# Patient Record
Sex: Male | Born: 1940 | Race: Black or African American | Hispanic: No | Smoking: Current some day smoker
Health system: Southern US, Community
[De-identification: ages and names within clinical notes are randomized; demographics above are authoritative.]

## PROBLEM LIST (undated history)

## (undated) DIAGNOSIS — I255 Ischemic cardiomyopathy: Secondary | ICD-10-CM

## (undated) DIAGNOSIS — Z972 Presence of dental prosthetic device (complete) (partial): Secondary | ICD-10-CM

## (undated) DIAGNOSIS — I2510 Atherosclerotic heart disease of native coronary artery without angina pectoris: Secondary | ICD-10-CM

## (undated) DIAGNOSIS — I10 Essential (primary) hypertension: Secondary | ICD-10-CM

## (undated) DIAGNOSIS — Z973 Presence of spectacles and contact lenses: Secondary | ICD-10-CM

## (undated) DIAGNOSIS — I509 Heart failure, unspecified: Secondary | ICD-10-CM

## (undated) DIAGNOSIS — M109 Gout, unspecified: Secondary | ICD-10-CM

## (undated) DIAGNOSIS — I714 Abdominal aortic aneurysm, without rupture, unspecified: Secondary | ICD-10-CM

## (undated) DIAGNOSIS — M1990 Unspecified osteoarthritis, unspecified site: Secondary | ICD-10-CM

## (undated) DIAGNOSIS — K08109 Complete loss of teeth, unspecified cause, unspecified class: Secondary | ICD-10-CM

## (undated) DIAGNOSIS — Z4502 Encounter for adjustment and management of automatic implantable cardiac defibrillator: Secondary | ICD-10-CM

## (undated) DIAGNOSIS — J189 Pneumonia, unspecified organism: Secondary | ICD-10-CM

## (undated) DIAGNOSIS — E7800 Pure hypercholesterolemia, unspecified: Secondary | ICD-10-CM

## (undated) DIAGNOSIS — N289 Disorder of kidney and ureter, unspecified: Secondary | ICD-10-CM

## (undated) DIAGNOSIS — I4891 Unspecified atrial fibrillation: Secondary | ICD-10-CM

## (undated) HISTORY — PX: SHOULDER SURGERY: SHX246

## (undated) HISTORY — PX: WRIST SURGERY: SHX841

## (undated) HISTORY — DX: Encounter for adjustment and management of automatic implantable cardiac defibrillator: Z45.02

## (undated) HISTORY — PX: COLONOSCOPY W/ BIOPSIES AND POLYPECTOMY: SHX1376

## (undated) HISTORY — PX: TOTAL SHOULDER REPLACEMENT: SUR1217

## (undated) HISTORY — PX: MULTIPLE TOOTH EXTRACTIONS: SHX2053

## (undated) HISTORY — DX: Ischemic cardiomyopathy: I25.5

## (undated) HISTORY — PX: CARDIAC SURGERY: SHX584

## (undated) HISTORY — PX: MASS EXCISION: SHX2000

## (undated) HISTORY — PX: PACEMAKER PLACEMENT: SHX43

## (undated) HISTORY — DX: Disorder of kidney and ureter, unspecified: N28.9

## (undated) HISTORY — PX: BACK SURGERY: SHX140

## (undated) HISTORY — PX: CATARACT EXTRACTION W/ INTRAOCULAR LENS  IMPLANT, BILATERAL: SHX1307

---

## 2013-07-15 DIAGNOSIS — M109 Gout, unspecified: Secondary | ICD-10-CM | POA: Diagnosis not present

## 2013-07-15 DIAGNOSIS — I10 Essential (primary) hypertension: Secondary | ICD-10-CM | POA: Diagnosis not present

## 2013-07-15 DIAGNOSIS — I5022 Chronic systolic (congestive) heart failure: Secondary | ICD-10-CM | POA: Diagnosis not present

## 2013-07-15 DIAGNOSIS — E7800 Pure hypercholesterolemia, unspecified: Secondary | ICD-10-CM | POA: Diagnosis not present

## 2013-09-09 DIAGNOSIS — N189 Chronic kidney disease, unspecified: Secondary | ICD-10-CM | POA: Diagnosis not present

## 2013-09-09 DIAGNOSIS — I2510 Atherosclerotic heart disease of native coronary artery without angina pectoris: Secondary | ICD-10-CM | POA: Diagnosis not present

## 2013-09-09 DIAGNOSIS — M79609 Pain in unspecified limb: Secondary | ICD-10-CM | POA: Diagnosis not present

## 2013-09-09 DIAGNOSIS — J811 Chronic pulmonary edema: Secondary | ICD-10-CM | POA: Diagnosis not present

## 2013-09-09 DIAGNOSIS — I129 Hypertensive chronic kidney disease with stage 1 through stage 4 chronic kidney disease, or unspecified chronic kidney disease: Secondary | ICD-10-CM | POA: Diagnosis not present

## 2013-09-09 DIAGNOSIS — M25559 Pain in unspecified hip: Secondary | ICD-10-CM | POA: Diagnosis not present

## 2013-09-09 DIAGNOSIS — I252 Old myocardial infarction: Secondary | ICD-10-CM | POA: Diagnosis not present

## 2013-09-09 DIAGNOSIS — H81399 Other peripheral vertigo, unspecified ear: Secondary | ICD-10-CM | POA: Diagnosis not present

## 2013-09-09 DIAGNOSIS — Z7982 Long term (current) use of aspirin: Secondary | ICD-10-CM | POA: Diagnosis not present

## 2013-09-09 DIAGNOSIS — E875 Hyperkalemia: Secondary | ICD-10-CM | POA: Diagnosis not present

## 2013-09-09 DIAGNOSIS — I4891 Unspecified atrial fibrillation: Secondary | ICD-10-CM | POA: Diagnosis not present

## 2013-09-09 DIAGNOSIS — I509 Heart failure, unspecified: Secondary | ICD-10-CM | POA: Diagnosis not present

## 2013-09-09 DIAGNOSIS — E785 Hyperlipidemia, unspecified: Secondary | ICD-10-CM | POA: Diagnosis not present

## 2013-09-09 DIAGNOSIS — R51 Headache: Secondary | ICD-10-CM | POA: Diagnosis not present

## 2013-09-09 DIAGNOSIS — I5022 Chronic systolic (congestive) heart failure: Secondary | ICD-10-CM | POA: Diagnosis not present

## 2013-09-09 DIAGNOSIS — Z950 Presence of cardiac pacemaker: Secondary | ICD-10-CM | POA: Diagnosis not present

## 2013-09-09 DIAGNOSIS — J449 Chronic obstructive pulmonary disease, unspecified: Secondary | ICD-10-CM | POA: Diagnosis not present

## 2013-09-14 DIAGNOSIS — N183 Chronic kidney disease, stage 3 unspecified: Secondary | ICD-10-CM | POA: Diagnosis not present

## 2013-09-14 DIAGNOSIS — I10 Essential (primary) hypertension: Secondary | ICD-10-CM | POA: Diagnosis not present

## 2013-09-14 DIAGNOSIS — N2581 Secondary hyperparathyroidism of renal origin: Secondary | ICD-10-CM | POA: Diagnosis not present

## 2013-09-21 DIAGNOSIS — N183 Chronic kidney disease, stage 3 unspecified: Secondary | ICD-10-CM | POA: Diagnosis not present

## 2013-09-21 DIAGNOSIS — I10 Essential (primary) hypertension: Secondary | ICD-10-CM | POA: Diagnosis not present

## 2013-10-14 DIAGNOSIS — J449 Chronic obstructive pulmonary disease, unspecified: Secondary | ICD-10-CM | POA: Diagnosis not present

## 2013-10-14 DIAGNOSIS — M109 Gout, unspecified: Secondary | ICD-10-CM | POA: Diagnosis not present

## 2013-10-14 DIAGNOSIS — R42 Dizziness and giddiness: Secondary | ICD-10-CM | POA: Diagnosis not present

## 2013-10-14 DIAGNOSIS — IMO0002 Reserved for concepts with insufficient information to code with codable children: Secondary | ICD-10-CM | POA: Diagnosis not present

## 2013-10-22 DIAGNOSIS — H6120 Impacted cerumen, unspecified ear: Secondary | ICD-10-CM | POA: Diagnosis not present

## 2013-10-22 DIAGNOSIS — H9190 Unspecified hearing loss, unspecified ear: Secondary | ICD-10-CM | POA: Diagnosis not present

## 2013-10-22 DIAGNOSIS — I951 Orthostatic hypotension: Secondary | ICD-10-CM | POA: Diagnosis not present

## 2013-10-22 DIAGNOSIS — J449 Chronic obstructive pulmonary disease, unspecified: Secondary | ICD-10-CM | POA: Diagnosis not present

## 2013-10-22 DIAGNOSIS — N183 Chronic kidney disease, stage 3 unspecified: Secondary | ICD-10-CM | POA: Diagnosis not present

## 2013-10-22 DIAGNOSIS — IMO0002 Reserved for concepts with insufficient information to code with codable children: Secondary | ICD-10-CM | POA: Diagnosis not present

## 2013-10-22 DIAGNOSIS — M109 Gout, unspecified: Secondary | ICD-10-CM | POA: Diagnosis not present

## 2013-10-22 DIAGNOSIS — I9589 Other hypotension: Secondary | ICD-10-CM | POA: Diagnosis not present

## 2013-10-29 DIAGNOSIS — I5022 Chronic systolic (congestive) heart failure: Secondary | ICD-10-CM | POA: Diagnosis not present

## 2013-10-29 DIAGNOSIS — I509 Heart failure, unspecified: Secondary | ICD-10-CM | POA: Diagnosis not present

## 2013-10-29 DIAGNOSIS — E875 Hyperkalemia: Secondary | ICD-10-CM | POA: Diagnosis not present

## 2013-10-29 DIAGNOSIS — I2510 Atherosclerotic heart disease of native coronary artery without angina pectoris: Secondary | ICD-10-CM | POA: Diagnosis not present

## 2013-10-29 DIAGNOSIS — H6120 Impacted cerumen, unspecified ear: Secondary | ICD-10-CM | POA: Diagnosis not present

## 2013-12-01 DIAGNOSIS — H903 Sensorineural hearing loss, bilateral: Secondary | ICD-10-CM | POA: Diagnosis not present

## 2013-12-02 DIAGNOSIS — H903 Sensorineural hearing loss, bilateral: Secondary | ICD-10-CM | POA: Diagnosis not present

## 2013-12-02 DIAGNOSIS — I509 Heart failure, unspecified: Secondary | ICD-10-CM | POA: Diagnosis not present

## 2013-12-02 DIAGNOSIS — I5022 Chronic systolic (congestive) heart failure: Secondary | ICD-10-CM | POA: Diagnosis not present

## 2013-12-02 DIAGNOSIS — I10 Essential (primary) hypertension: Secondary | ICD-10-CM | POA: Diagnosis not present

## 2013-12-02 DIAGNOSIS — I2510 Atherosclerotic heart disease of native coronary artery without angina pectoris: Secondary | ICD-10-CM | POA: Diagnosis not present

## 2013-12-09 DIAGNOSIS — H6120 Impacted cerumen, unspecified ear: Secondary | ICD-10-CM | POA: Diagnosis not present

## 2013-12-09 DIAGNOSIS — H6980 Other specified disorders of Eustachian tube, unspecified ear: Secondary | ICD-10-CM | POA: Diagnosis not present

## 2014-01-24 DIAGNOSIS — I10 Essential (primary) hypertension: Secondary | ICD-10-CM | POA: Diagnosis not present

## 2014-01-24 DIAGNOSIS — N183 Chronic kidney disease, stage 3 unspecified: Secondary | ICD-10-CM | POA: Diagnosis not present

## 2014-01-24 DIAGNOSIS — N2581 Secondary hyperparathyroidism of renal origin: Secondary | ICD-10-CM | POA: Diagnosis not present

## 2014-02-10 DIAGNOSIS — N183 Chronic kidney disease, stage 3 unspecified: Secondary | ICD-10-CM | POA: Diagnosis not present

## 2014-02-10 DIAGNOSIS — I10 Essential (primary) hypertension: Secondary | ICD-10-CM | POA: Diagnosis not present

## 2014-02-18 DIAGNOSIS — I428 Other cardiomyopathies: Secondary | ICD-10-CM | POA: Diagnosis not present

## 2014-02-18 DIAGNOSIS — I10 Essential (primary) hypertension: Secondary | ICD-10-CM | POA: Diagnosis not present

## 2014-02-18 DIAGNOSIS — Z95810 Presence of automatic (implantable) cardiac defibrillator: Secondary | ICD-10-CM | POA: Diagnosis not present

## 2014-02-18 DIAGNOSIS — I4891 Unspecified atrial fibrillation: Secondary | ICD-10-CM | POA: Diagnosis not present

## 2014-03-31 DIAGNOSIS — I2510 Atherosclerotic heart disease of native coronary artery without angina pectoris: Secondary | ICD-10-CM | POA: Diagnosis not present

## 2014-03-31 DIAGNOSIS — Z23 Encounter for immunization: Secondary | ICD-10-CM | POA: Diagnosis not present

## 2014-03-31 DIAGNOSIS — J449 Chronic obstructive pulmonary disease, unspecified: Secondary | ICD-10-CM | POA: Diagnosis not present

## 2014-05-17 DIAGNOSIS — I5023 Acute on chronic systolic (congestive) heart failure: Secondary | ICD-10-CM | POA: Diagnosis present

## 2014-05-17 DIAGNOSIS — I252 Old myocardial infarction: Secondary | ICD-10-CM | POA: Diagnosis not present

## 2014-05-17 DIAGNOSIS — J449 Chronic obstructive pulmonary disease, unspecified: Secondary | ICD-10-CM | POA: Diagnosis not present

## 2014-05-17 DIAGNOSIS — I10 Essential (primary) hypertension: Secondary | ICD-10-CM | POA: Diagnosis not present

## 2014-05-17 DIAGNOSIS — I129 Hypertensive chronic kidney disease with stage 1 through stage 4 chronic kidney disease, or unspecified chronic kidney disease: Secondary | ICD-10-CM | POA: Diagnosis not present

## 2014-05-17 DIAGNOSIS — I509 Heart failure, unspecified: Secondary | ICD-10-CM | POA: Diagnosis not present

## 2014-05-17 DIAGNOSIS — E785 Hyperlipidemia, unspecified: Secondary | ICD-10-CM | POA: Diagnosis present

## 2014-05-17 DIAGNOSIS — I4891 Unspecified atrial fibrillation: Secondary | ICD-10-CM | POA: Diagnosis not present

## 2014-05-17 DIAGNOSIS — M1000 Idiopathic gout, unspecified site: Secondary | ICD-10-CM | POA: Diagnosis present

## 2014-05-17 DIAGNOSIS — R0602 Shortness of breath: Secondary | ICD-10-CM | POA: Diagnosis not present

## 2014-05-17 DIAGNOSIS — I2510 Atherosclerotic heart disease of native coronary artery without angina pectoris: Secondary | ICD-10-CM | POA: Diagnosis present

## 2014-05-17 DIAGNOSIS — N183 Chronic kidney disease, stage 3 (moderate): Secondary | ICD-10-CM | POA: Diagnosis not present

## 2014-05-17 DIAGNOSIS — Z87891 Personal history of nicotine dependence: Secondary | ICD-10-CM | POA: Diagnosis not present

## 2014-05-17 DIAGNOSIS — Z950 Presence of cardiac pacemaker: Secondary | ICD-10-CM | POA: Diagnosis not present

## 2014-06-04 DIAGNOSIS — I129 Hypertensive chronic kidney disease with stage 1 through stage 4 chronic kidney disease, or unspecified chronic kidney disease: Secondary | ICD-10-CM | POA: Diagnosis not present

## 2014-06-04 DIAGNOSIS — I509 Heart failure, unspecified: Secondary | ICD-10-CM | POA: Diagnosis not present

## 2014-06-04 DIAGNOSIS — J449 Chronic obstructive pulmonary disease, unspecified: Secondary | ICD-10-CM | POA: Diagnosis not present

## 2014-06-04 DIAGNOSIS — R069 Unspecified abnormalities of breathing: Secondary | ICD-10-CM | POA: Diagnosis not present

## 2014-06-04 DIAGNOSIS — N183 Chronic kidney disease, stage 3 (moderate): Secondary | ICD-10-CM | POA: Diagnosis not present

## 2014-06-05 DIAGNOSIS — I2510 Atherosclerotic heart disease of native coronary artery without angina pectoris: Secondary | ICD-10-CM | POA: Diagnosis present

## 2014-06-05 DIAGNOSIS — Z95810 Presence of automatic (implantable) cardiac defibrillator: Secondary | ICD-10-CM | POA: Diagnosis not present

## 2014-06-05 DIAGNOSIS — Z6830 Body mass index (BMI) 30.0-30.9, adult: Secondary | ICD-10-CM | POA: Diagnosis not present

## 2014-06-05 DIAGNOSIS — Z7982 Long term (current) use of aspirin: Secondary | ICD-10-CM | POA: Diagnosis not present

## 2014-06-05 DIAGNOSIS — R918 Other nonspecific abnormal finding of lung field: Secondary | ICD-10-CM | POA: Diagnosis not present

## 2014-06-05 DIAGNOSIS — J449 Chronic obstructive pulmonary disease, unspecified: Secondary | ICD-10-CM | POA: Diagnosis not present

## 2014-06-05 DIAGNOSIS — N183 Chronic kidney disease, stage 3 (moderate): Secondary | ICD-10-CM | POA: Diagnosis present

## 2014-06-05 DIAGNOSIS — I252 Old myocardial infarction: Secondary | ICD-10-CM | POA: Diagnosis not present

## 2014-06-05 DIAGNOSIS — K761 Chronic passive congestion of liver: Secondary | ICD-10-CM | POA: Diagnosis present

## 2014-06-05 DIAGNOSIS — J439 Emphysema, unspecified: Secondary | ICD-10-CM | POA: Diagnosis present

## 2014-06-05 DIAGNOSIS — Z87891 Personal history of nicotine dependence: Secondary | ICD-10-CM | POA: Diagnosis not present

## 2014-06-05 DIAGNOSIS — M1990 Unspecified osteoarthritis, unspecified site: Secondary | ICD-10-CM | POA: Diagnosis present

## 2014-06-05 DIAGNOSIS — E785 Hyperlipidemia, unspecified: Secondary | ICD-10-CM | POA: Diagnosis present

## 2014-06-05 DIAGNOSIS — Z888 Allergy status to other drugs, medicaments and biological substances status: Secondary | ICD-10-CM | POA: Diagnosis not present

## 2014-06-05 DIAGNOSIS — I493 Ventricular premature depolarization: Secondary | ICD-10-CM | POA: Diagnosis present

## 2014-06-05 DIAGNOSIS — T501X5A Adverse effect of loop [high-ceiling] diuretics, initial encounter: Secondary | ICD-10-CM | POA: Diagnosis present

## 2014-06-05 DIAGNOSIS — I4891 Unspecified atrial fibrillation: Secondary | ICD-10-CM | POA: Diagnosis present

## 2014-06-05 DIAGNOSIS — I129 Hypertensive chronic kidney disease with stage 1 through stage 4 chronic kidney disease, or unspecified chronic kidney disease: Secondary | ICD-10-CM | POA: Diagnosis not present

## 2014-06-05 DIAGNOSIS — I5021 Acute systolic (congestive) heart failure: Secondary | ICD-10-CM | POA: Diagnosis not present

## 2014-06-05 DIAGNOSIS — M1000 Idiopathic gout, unspecified site: Secondary | ICD-10-CM | POA: Diagnosis present

## 2014-06-05 DIAGNOSIS — E669 Obesity, unspecified: Secondary | ICD-10-CM | POA: Diagnosis present

## 2014-06-05 DIAGNOSIS — Z91030 Bee allergy status: Secondary | ICD-10-CM | POA: Diagnosis not present

## 2014-06-05 DIAGNOSIS — E876 Hypokalemia: Secondary | ICD-10-CM | POA: Diagnosis not present

## 2014-06-05 DIAGNOSIS — I10 Essential (primary) hypertension: Secondary | ICD-10-CM | POA: Diagnosis not present

## 2014-06-05 DIAGNOSIS — I517 Cardiomegaly: Secondary | ICD-10-CM | POA: Diagnosis not present

## 2014-06-05 DIAGNOSIS — I5023 Acute on chronic systolic (congestive) heart failure: Secondary | ICD-10-CM | POA: Diagnosis not present

## 2014-06-05 DIAGNOSIS — I509 Heart failure, unspecified: Secondary | ICD-10-CM | POA: Diagnosis not present

## 2014-06-15 DIAGNOSIS — T783XXA Angioneurotic edema, initial encounter: Secondary | ICD-10-CM | POA: Diagnosis not present

## 2014-07-25 DIAGNOSIS — I10 Essential (primary) hypertension: Secondary | ICD-10-CM | POA: Diagnosis not present

## 2014-07-25 DIAGNOSIS — N183 Chronic kidney disease, stage 3 (moderate): Secondary | ICD-10-CM | POA: Diagnosis not present

## 2014-08-01 DIAGNOSIS — I10 Essential (primary) hypertension: Secondary | ICD-10-CM | POA: Diagnosis not present

## 2014-08-01 DIAGNOSIS — N183 Chronic kidney disease, stage 3 (moderate): Secondary | ICD-10-CM | POA: Diagnosis not present

## 2014-08-02 DIAGNOSIS — I482 Chronic atrial fibrillation: Secondary | ICD-10-CM | POA: Diagnosis not present

## 2014-08-02 DIAGNOSIS — Z95810 Presence of automatic (implantable) cardiac defibrillator: Secondary | ICD-10-CM | POA: Diagnosis not present

## 2014-08-02 DIAGNOSIS — I429 Cardiomyopathy, unspecified: Secondary | ICD-10-CM | POA: Diagnosis not present

## 2014-08-02 DIAGNOSIS — I10 Essential (primary) hypertension: Secondary | ICD-10-CM | POA: Diagnosis not present

## 2014-08-10 DIAGNOSIS — Z95810 Presence of automatic (implantable) cardiac defibrillator: Secondary | ICD-10-CM | POA: Diagnosis not present

## 2014-09-22 DIAGNOSIS — M159 Polyosteoarthritis, unspecified: Secondary | ICD-10-CM | POA: Diagnosis not present

## 2014-09-22 DIAGNOSIS — J449 Chronic obstructive pulmonary disease, unspecified: Secondary | ICD-10-CM | POA: Diagnosis not present

## 2014-09-22 DIAGNOSIS — R238 Other skin changes: Secondary | ICD-10-CM | POA: Diagnosis not present

## 2014-09-22 DIAGNOSIS — I10 Essential (primary) hypertension: Secondary | ICD-10-CM | POA: Diagnosis not present

## 2014-12-01 DIAGNOSIS — Z95810 Presence of automatic (implantable) cardiac defibrillator: Secondary | ICD-10-CM | POA: Diagnosis not present

## 2014-12-27 DIAGNOSIS — N2581 Secondary hyperparathyroidism of renal origin: Secondary | ICD-10-CM | POA: Diagnosis not present

## 2014-12-27 DIAGNOSIS — N183 Chronic kidney disease, stage 3 (moderate): Secondary | ICD-10-CM | POA: Diagnosis not present

## 2014-12-27 DIAGNOSIS — I10 Essential (primary) hypertension: Secondary | ICD-10-CM | POA: Diagnosis not present

## 2015-01-02 DIAGNOSIS — I10 Essential (primary) hypertension: Secondary | ICD-10-CM | POA: Diagnosis not present

## 2015-01-02 DIAGNOSIS — N183 Chronic kidney disease, stage 3 (moderate): Secondary | ICD-10-CM | POA: Diagnosis not present

## 2015-01-03 DIAGNOSIS — I482 Chronic atrial fibrillation: Secondary | ICD-10-CM | POA: Diagnosis not present

## 2015-01-03 DIAGNOSIS — I10 Essential (primary) hypertension: Secondary | ICD-10-CM | POA: Diagnosis not present

## 2015-01-03 DIAGNOSIS — I429 Cardiomyopathy, unspecified: Secondary | ICD-10-CM | POA: Diagnosis not present

## 2015-01-03 DIAGNOSIS — Z95810 Presence of automatic (implantable) cardiac defibrillator: Secondary | ICD-10-CM | POA: Diagnosis not present

## 2015-01-19 DIAGNOSIS — L600 Ingrowing nail: Secondary | ICD-10-CM | POA: Diagnosis not present

## 2015-02-23 DIAGNOSIS — I10 Essential (primary) hypertension: Secondary | ICD-10-CM | POA: Diagnosis not present

## 2015-02-23 DIAGNOSIS — R220 Localized swelling, mass and lump, head: Secondary | ICD-10-CM | POA: Diagnosis not present

## 2015-03-09 DIAGNOSIS — Z95810 Presence of automatic (implantable) cardiac defibrillator: Secondary | ICD-10-CM | POA: Diagnosis not present

## 2015-04-26 DIAGNOSIS — I482 Chronic atrial fibrillation: Secondary | ICD-10-CM | POA: Diagnosis not present

## 2015-04-26 DIAGNOSIS — N184 Chronic kidney disease, stage 4 (severe): Secondary | ICD-10-CM | POA: Diagnosis not present

## 2015-04-26 DIAGNOSIS — Z23 Encounter for immunization: Secondary | ICD-10-CM | POA: Diagnosis not present

## 2015-04-26 DIAGNOSIS — M5442 Lumbago with sciatica, left side: Secondary | ICD-10-CM | POA: Diagnosis not present

## 2015-04-26 DIAGNOSIS — R202 Paresthesia of skin: Secondary | ICD-10-CM | POA: Diagnosis not present

## 2015-04-26 DIAGNOSIS — I5022 Chronic systolic (congestive) heart failure: Secondary | ICD-10-CM | POA: Diagnosis not present

## 2015-04-26 DIAGNOSIS — G8929 Other chronic pain: Secondary | ICD-10-CM | POA: Diagnosis not present

## 2015-04-26 DIAGNOSIS — I10 Essential (primary) hypertension: Secondary | ICD-10-CM | POA: Diagnosis not present

## 2015-05-02 DIAGNOSIS — E7800 Pure hypercholesterolemia, unspecified: Secondary | ICD-10-CM | POA: Diagnosis not present

## 2015-05-02 DIAGNOSIS — Z95810 Presence of automatic (implantable) cardiac defibrillator: Secondary | ICD-10-CM | POA: Diagnosis not present

## 2015-05-02 DIAGNOSIS — I429 Cardiomyopathy, unspecified: Secondary | ICD-10-CM | POA: Diagnosis not present

## 2015-05-02 DIAGNOSIS — I10 Essential (primary) hypertension: Secondary | ICD-10-CM | POA: Diagnosis not present

## 2015-05-02 DIAGNOSIS — I4891 Unspecified atrial fibrillation: Secondary | ICD-10-CM | POA: Diagnosis not present

## 2015-05-05 DIAGNOSIS — H04123 Dry eye syndrome of bilateral lacrimal glands: Secondary | ICD-10-CM | POA: Diagnosis not present

## 2015-05-05 DIAGNOSIS — H43393 Other vitreous opacities, bilateral: Secondary | ICD-10-CM | POA: Diagnosis not present

## 2015-05-05 DIAGNOSIS — H2513 Age-related nuclear cataract, bilateral: Secondary | ICD-10-CM | POA: Diagnosis not present

## 2015-05-29 DIAGNOSIS — N183 Chronic kidney disease, stage 3 (moderate): Secondary | ICD-10-CM | POA: Diagnosis not present

## 2015-05-29 DIAGNOSIS — I501 Left ventricular failure: Secondary | ICD-10-CM | POA: Diagnosis not present

## 2015-06-01 DIAGNOSIS — N183 Chronic kidney disease, stage 3 (moderate): Secondary | ICD-10-CM | POA: Diagnosis not present

## 2015-06-01 DIAGNOSIS — I10 Essential (primary) hypertension: Secondary | ICD-10-CM | POA: Diagnosis not present

## 2015-07-07 ENCOUNTER — Inpatient Hospital Stay (HOSPITAL_COMMUNITY)
Admission: EM | Admit: 2015-07-07 | Discharge: 2015-07-09 | DRG: 292 | Disposition: A | Discharge status: Home / Self Care | Payer: Medicare Other | Attending: Student in an Organized Health Care Education/Training Program | Admitting: Student in an Organized Health Care Education/Training Program

## 2015-07-07 ENCOUNTER — Encounter (HOSPITAL_COMMUNITY): Payer: Self-pay

## 2015-07-07 ENCOUNTER — Emergency Department (HOSPITAL_COMMUNITY): Payer: Medicare Other

## 2015-07-07 DIAGNOSIS — I509 Heart failure, unspecified: Secondary | ICD-10-CM

## 2015-07-07 DIAGNOSIS — E7800 Pure hypercholesterolemia, unspecified: Secondary | ICD-10-CM | POA: Diagnosis not present

## 2015-07-07 DIAGNOSIS — N184 Chronic kidney disease, stage 4 (severe): Secondary | ICD-10-CM | POA: Diagnosis present

## 2015-07-07 DIAGNOSIS — R52 Pain, unspecified: Secondary | ICD-10-CM | POA: Diagnosis not present

## 2015-07-07 DIAGNOSIS — Z791 Long term (current) use of non-steroidal anti-inflammatories (NSAID): Secondary | ICD-10-CM | POA: Diagnosis not present

## 2015-07-07 DIAGNOSIS — M109 Gout, unspecified: Secondary | ICD-10-CM | POA: Diagnosis present

## 2015-07-07 DIAGNOSIS — Z950 Presence of cardiac pacemaker: Secondary | ICD-10-CM | POA: Diagnosis not present

## 2015-07-07 DIAGNOSIS — Z91048 Other nonmedicinal substance allergy status: Secondary | ICD-10-CM

## 2015-07-07 DIAGNOSIS — Z7901 Long term (current) use of anticoagulants: Secondary | ICD-10-CM | POA: Diagnosis not present

## 2015-07-07 DIAGNOSIS — I2510 Atherosclerotic heart disease of native coronary artery without angina pectoris: Secondary | ICD-10-CM | POA: Diagnosis present

## 2015-07-07 DIAGNOSIS — R600 Localized edema: Secondary | ICD-10-CM | POA: Diagnosis present

## 2015-07-07 DIAGNOSIS — Z7982 Long term (current) use of aspirin: Secondary | ICD-10-CM | POA: Diagnosis not present

## 2015-07-07 DIAGNOSIS — I255 Ischemic cardiomyopathy: Secondary | ICD-10-CM | POA: Diagnosis not present

## 2015-07-07 DIAGNOSIS — Z881 Allergy status to other antibiotic agents status: Secondary | ICD-10-CM | POA: Diagnosis not present

## 2015-07-07 DIAGNOSIS — M7989 Other specified soft tissue disorders: Secondary | ICD-10-CM | POA: Diagnosis not present

## 2015-07-07 DIAGNOSIS — R0602 Shortness of breath: Secondary | ICD-10-CM | POA: Diagnosis not present

## 2015-07-07 DIAGNOSIS — M79604 Pain in right leg: Secondary | ICD-10-CM | POA: Diagnosis not present

## 2015-07-07 DIAGNOSIS — J449 Chronic obstructive pulmonary disease, unspecified: Secondary | ICD-10-CM | POA: Diagnosis present

## 2015-07-07 DIAGNOSIS — I110 Hypertensive heart disease with heart failure: Secondary | ICD-10-CM | POA: Diagnosis not present

## 2015-07-07 DIAGNOSIS — Z87891 Personal history of nicotine dependence: Secondary | ICD-10-CM

## 2015-07-07 DIAGNOSIS — I5022 Chronic systolic (congestive) heart failure: Secondary | ICD-10-CM | POA: Clinically undetermined

## 2015-07-07 DIAGNOSIS — M25561 Pain in right knee: Secondary | ICD-10-CM | POA: Diagnosis present

## 2015-07-07 DIAGNOSIS — Z880 Allergy status to penicillin: Secondary | ICD-10-CM

## 2015-07-07 DIAGNOSIS — M79605 Pain in left leg: Secondary | ICD-10-CM | POA: Diagnosis not present

## 2015-07-07 DIAGNOSIS — E8770 Fluid overload, unspecified: Secondary | ICD-10-CM | POA: Diagnosis present

## 2015-07-07 DIAGNOSIS — Z95810 Presence of automatic (implantable) cardiac defibrillator: Secondary | ICD-10-CM

## 2015-07-07 DIAGNOSIS — I130 Hypertensive heart and chronic kidney disease with heart failure and stage 1 through stage 4 chronic kidney disease, or unspecified chronic kidney disease: Secondary | ICD-10-CM | POA: Diagnosis not present

## 2015-07-07 DIAGNOSIS — Z888 Allergy status to other drugs, medicaments and biological substances status: Secondary | ICD-10-CM

## 2015-07-07 DIAGNOSIS — M25461 Effusion, right knee: Secondary | ICD-10-CM | POA: Diagnosis not present

## 2015-07-07 DIAGNOSIS — I480 Paroxysmal atrial fibrillation: Secondary | ICD-10-CM

## 2015-07-07 DIAGNOSIS — I5020 Unspecified systolic (congestive) heart failure: Secondary | ICD-10-CM

## 2015-07-07 HISTORY — DX: Essential (primary) hypertension: I10

## 2015-07-07 HISTORY — DX: Pure hypercholesterolemia, unspecified: E78.00

## 2015-07-07 HISTORY — DX: Gout, unspecified: M10.9

## 2015-07-07 HISTORY — DX: Atherosclerotic heart disease of native coronary artery without angina pectoris: I25.10

## 2015-07-07 HISTORY — DX: Unspecified osteoarthritis, unspecified site: M19.90

## 2015-07-07 HISTORY — DX: Heart failure, unspecified: I50.9

## 2015-07-07 HISTORY — DX: Unspecified atrial fibrillation: I48.91

## 2015-07-07 LAB — I-STAT TROPONIN, ED: TROPONIN I, POC: 0.06 ng/mL (ref 0.00–0.08)

## 2015-07-07 LAB — BASIC METABOLIC PANEL
Anion gap: 10 (ref 5–15)
BUN: 29 mg/dL — AB (ref 6–20)
CALCIUM: 9.6 mg/dL (ref 8.9–10.3)
CO2: 31 mmol/L (ref 22–32)
CREATININE: 2.73 mg/dL — AB (ref 0.61–1.24)
Chloride: 99 mmol/L — ABNORMAL LOW (ref 101–111)
GFR calc Af Amer: 24 mL/min — ABNORMAL LOW (ref >60)
GFR, EST NON AFRICAN AMERICAN: 21 mL/min — AB (ref >60)
GLUCOSE: 133 mg/dL — AB (ref 65–99)
POTASSIUM: 4.3 mmol/L (ref 3.5–5.1)
SODIUM: 140 mmol/L (ref 135–145)

## 2015-07-07 LAB — BRAIN NATRIURETIC PEPTIDE: B Natriuretic Peptide: 2600.9 pg/mL — ABNORMAL HIGH (ref 0.0–100.0)

## 2015-07-07 LAB — I-STAT CHEM 8, ED
BUN: 32 mg/dL — ABNORMAL HIGH (ref 6–20)
CALCIUM ION: 1.27 mmol/L (ref 1.13–1.30)
CREATININE: 2.4 mg/dL — AB (ref 0.61–1.24)
Chloride: 98 mmol/L — ABNORMAL LOW (ref 101–111)
GLUCOSE: 126 mg/dL — AB (ref 65–99)
HCT: 42 % (ref 39.0–52.0)
HEMOGLOBIN: 14.3 g/dL (ref 13.0–17.0)
Potassium: 3.5 mmol/L (ref 3.5–5.1)
Sodium: 144 mmol/L (ref 135–145)
TCO2: 33 mmol/L (ref 0–100)

## 2015-07-07 LAB — CBC WITH DIFFERENTIAL/PLATELET
BASOS ABS: 0 10*3/uL (ref 0.0–0.1)
BASOS PCT: 0 %
EOS ABS: 0.3 10*3/uL (ref 0.0–0.7)
EOS PCT: 2 %
HCT: 38.7 % — ABNORMAL LOW (ref 39.0–52.0)
Hemoglobin: 11.8 g/dL — ABNORMAL LOW (ref 13.0–17.0)
Lymphocytes Relative: 10 %
Lymphs Abs: 1.3 10*3/uL (ref 0.7–4.0)
MCH: 25 pg — ABNORMAL LOW (ref 26.0–34.0)
MCHC: 30.5 g/dL (ref 30.0–36.0)
MCV: 82 fL (ref 78.0–100.0)
MONO ABS: 1.3 10*3/uL — AB (ref 0.1–1.0)
Monocytes Relative: 10 %
Neutro Abs: 10 10*3/uL — ABNORMAL HIGH (ref 1.7–7.7)
Neutrophils Relative %: 78 %
PLATELETS: 175 10*3/uL (ref 150–400)
RBC: 4.72 MIL/uL (ref 4.22–5.81)
RDW: 16.4 % — AB (ref 11.5–15.5)
WBC: 12.9 10*3/uL — ABNORMAL HIGH (ref 4.0–10.5)

## 2015-07-07 LAB — HEPATIC FUNCTION PANEL
ALBUMIN: 3.3 g/dL — AB (ref 3.5–5.0)
ALT: 19 U/L (ref 17–63)
AST: 20 U/L (ref 15–41)
Alkaline Phosphatase: 61 U/L (ref 38–126)
BILIRUBIN DIRECT: 0.3 mg/dL (ref 0.1–0.5)
Indirect Bilirubin: 0.7 mg/dL (ref 0.3–0.9)
Total Bilirubin: 1 mg/dL (ref 0.3–1.2)
Total Protein: 7.3 g/dL (ref 6.5–8.1)

## 2015-07-07 MED ORDER — FUROSEMIDE 10 MG/ML IJ SOLN
40.0000 mg | Freq: Once | INTRAMUSCULAR | Status: AC
  Administered 2015-07-07: 40 mg via INTRAVENOUS
  Filled 2015-07-07: qty 4

## 2015-07-07 MED ORDER — ACETAMINOPHEN 325 MG PO TABS: 650.0000 mg | ORAL_TABLET | Freq: Four times a day (QID) | ORAL | Status: DC | PRN

## 2015-07-07 MED ORDER — ASPIRIN EC 81 MG PO TBEC
81.0000 mg | DELAYED_RELEASE_TABLET | Freq: Every day | ORAL | Status: DC
  Administered 2015-07-07 – 2015-07-09 (×3): 81 mg via ORAL
  Filled 2015-07-07 (×3): qty 1

## 2015-07-07 MED ORDER — ATORVASTATIN CALCIUM 20 MG PO TABS
20.0000 mg | ORAL_TABLET | Freq: Every day | ORAL | Status: DC
  Administered 2015-07-07 – 2015-07-09 (×3): 20 mg via ORAL
  Filled 2015-07-07 (×3): qty 1

## 2015-07-07 MED ORDER — DIGOXIN 125 MCG PO TABS
0.1250 mg | ORAL_TABLET | Freq: Every day | ORAL | Status: DC
  Administered 2015-07-07 – 2015-07-09 (×3): 0.125 mg via ORAL
  Filled 2015-07-07 (×3): qty 1

## 2015-07-07 MED ORDER — MORPHINE SULFATE (PF) 2 MG/ML IV SOLN
2.0000 mg | Freq: Once | INTRAVENOUS | Status: AC
  Administered 2015-07-07: 2 mg via INTRAVENOUS
  Filled 2015-07-07: qty 1

## 2015-07-07 MED ORDER — PREDNISONE 50 MG PO TABS
50.0000 mg | ORAL_TABLET | Freq: Every day | ORAL | Status: DC
  Administered 2015-07-08 – 2015-07-09 (×2): 50 mg via ORAL
  Filled 2015-07-07 (×2): qty 1

## 2015-07-07 MED ORDER — DICLOFENAC SODIUM 1 % TD GEL
2.0000 g | Freq: Four times a day (QID) | TRANSDERMAL | Status: DC
  Administered 2015-07-07 – 2015-07-09 (×7): 2 g via TOPICAL
  Filled 2015-07-07: qty 100

## 2015-07-07 MED ORDER — ALBUTEROL SULFATE (2.5 MG/3ML) 0.083% IN NEBU: 2.5000 mg | INHALATION_SOLUTION | Freq: Four times a day (QID) | RESPIRATORY_TRACT | Status: DC | PRN

## 2015-07-07 MED ORDER — APIXABAN 5 MG PO TABS
5.0000 mg | ORAL_TABLET | Freq: Every day | ORAL | Status: DC
  Administered 2015-07-07 – 2015-07-08 (×2): 5 mg via ORAL
  Filled 2015-07-07 (×2): qty 1

## 2015-07-07 MED ORDER — CALCITRIOL 0.25 MCG PO CAPS
0.2500 ug | ORAL_CAPSULE | Freq: Every day | ORAL | Status: DC
  Administered 2015-07-07 – 2015-07-09 (×3): 0.25 ug via ORAL
  Filled 2015-07-07 (×3): qty 1

## 2015-07-07 MED ORDER — FUROSEMIDE 10 MG/ML IJ SOLN
40.0000 mg | Freq: Once | INTRAMUSCULAR | Status: AC
  Administered 2015-07-08: 40 mg via INTRAVENOUS
  Filled 2015-07-07: qty 4

## 2015-07-07 MED ORDER — ISOSORB DINITRATE-HYDRALAZINE 20-37.5 MG PO TABS
1.0000 | ORAL_TABLET | Freq: Three times a day (TID) | ORAL | Status: DC
  Administered 2015-07-07 – 2015-07-09 (×5): 1 via ORAL
  Filled 2015-07-07 (×5): qty 1

## 2015-07-07 MED ORDER — ACETAMINOPHEN-CODEINE #3 300-30 MG PO TABS
1.0000 | ORAL_TABLET | Freq: Four times a day (QID) | ORAL | Status: DC | PRN
  Administered 2015-07-08: 2 via ORAL
  Filled 2015-07-07: qty 2

## 2015-07-07 MED ORDER — TIOTROPIUM BROMIDE MONOHYDRATE 18 MCG IN CAPS
18.0000 ug | ORAL_CAPSULE | Freq: Every day | RESPIRATORY_TRACT | Status: DC
  Administered 2015-07-08 – 2015-07-09 (×2): 18 ug via RESPIRATORY_TRACT
  Filled 2015-07-07: qty 5

## 2015-07-07 MED ORDER — MOMETASONE FURO-FORMOTEROL FUM 100-5 MCG/ACT IN AERO
2.0000 | INHALATION_SPRAY | Freq: Two times a day (BID) | RESPIRATORY_TRACT | Status: DC
  Administered 2015-07-07 – 2015-07-09 (×4): 2 via RESPIRATORY_TRACT
  Filled 2015-07-07 (×2): qty 8.8

## 2015-07-07 MED ORDER — PREDNISONE 20 MG PO TABS
40.0000 mg | ORAL_TABLET | Freq: Every day | ORAL | Status: DC
  Administered 2015-07-07: 40 mg via ORAL
  Filled 2015-07-07: qty 2

## 2015-07-07 MED ORDER — CARVEDILOL 25 MG PO TABS
25.0000 mg | ORAL_TABLET | Freq: Every day | ORAL | Status: DC
  Administered 2015-07-07 – 2015-07-09 (×3): 25 mg via ORAL
  Filled 2015-07-07 (×3): qty 1

## 2015-07-07 NOTE — ED Notes (Signed)
Attempted report 

## 2015-07-07 NOTE — ED Notes (Signed)
Report received from [place], [place]- pt moved to rm [id number].

## 2015-07-07 NOTE — ED Notes (Signed)
Pt presents with 2-3 day h/o shortness of breath and BLE swelling.  Pt reports intermittent chest pain that is "slight", denies at present.  Pt reports "light" cough, reports nausea in route.

## 2015-07-07 NOTE — H&P (Signed)
Date: 07/07/2015               Patient Name:  Anthony Foster MRN: [id number]  DOB: [birth year] Age / Sex: 75 y.o., male   PCP: No primary care provider on file.         Medical Service: Internal Medicine Teaching Service         Attending Physician: Dr. Axel Filler, MD    First Contact: Dr. Marijean Bravo Pager: [phone]  Second Contact: Dr. 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 Pt says that he had a 5-57min episode of difficulty breathing when he woke up yesterday,  Bilateral Leg swelling- noticed by patient yesterday. He also complains of right knee pain that started ~3 days ago, sudden, with associated redness, and pain with mobility or bearing weight. He denies fever, chills, vomiting. He has a hx of gout, med list has colchicine and allorpurinol, which he says he takes as needed. He last took both meds- ~1 week ago. He normally has gout on his feet. Patient also mentioned SOB to Ed providers, on questioning he says he felt sob yesterday for about 72mins when he woke up, but is not short of breath today, he says he has always used 2 pillows to sleep, checks his weight regularly, and range- 190-200, last was 197 three days ago. He has cough productive of minimal sputum, he also says he has wheezing. He says he has some difficulty breathing when he doing certain activity like bowling. He denies chest pain, but apparently said otherwise in the ED. Pt quit smoking 3 years ago, but previously smoked 3PPD, started in his teens. He used to live in [place], [place] up at [employer] with Cardiology, nephrology and had a PCP. Marland Kitchen He has relocated to [facility]  permanently, but has not established care.   Meds: No current facility-administered medications for this encounter.   Current Outpatient Prescriptions  Medication Sig Dispense Refill  . albuterol (PROVENTIL HFA;VENTOLIN HFA) 108 (90 Base) MCG/ACT inhaler Inhale 1 puff into the lungs every 6 (six) hours as needed for wheezing or shortness of breath.    . allopurinol (ZYLOPRIM) 300 MG tablet Take 300 mg by mouth daily as needed. For gout    . apixaban (ELIQUIS) 2.5 MG TABS tablet Take 5 mg by mouth daily.     Marland Kitchen aspirin 325 MG tablet Take 325 mg by mouth daily.    Marland Kitchen atorvastatin (LIPITOR) 20 MG tablet Take 20 mg by mouth daily.    . calcitRIOL (ROCALTROL) 0.25 MCG capsule Take 0.25 mcg by mouth daily.    . carvedilol (COREG) 12.5 MG tablet Take 25 mg by mouth daily.     . colchicine 0.6 MG tablet Take 0.6 mg by mouth daily.    . diclofenac sodium (VOLTAREN) 1 % GEL Apply 2 g topically 4 (four) times daily.    . digoxin (LANOXIN) 0.125 MG tablet Take 0.125 mg by mouth daily.    . Fluticasone-Salmeterol (ADVAIR) 250-50 MCG/DOSE AEPB Inhale 1 puff into the lungs 2 (two) times daily.    . furosemide (LASIX) 20 MG tablet Take 20 mg  by mouth 3 (three) times daily.    . isosorbide-hydrALAZINE (BIDIL) 20-37.5 MG tablet Take 1 tablet by mouth 3 (three) times daily.    Marland Kitchen tiotropium (SPIRIVA) 18 MCG inhalation capsule Place 18 mcg into inhaler and inhale daily.      Allergies: Allergies as of 07/07/2015 - Review Complete 07/07/2015  Allergen Reaction Noted  . Hymenoptera venom preparations  07/07/2015  . Lisinopril Other (See Comments) 07/07/2015  . Piroxicam Other (See Comments) 07/07/2015  . Amoxicillin-pot clavulanate Rash 07/07/2015   Past Medical History  Diagnosis Date  . Atrial fibrillation ([place])   . Hypertension   . Hypercholesteremia   . Renal disorder   . Coronary artery disease   . CHF (congestive heart failure) ([place])   . Degenerative joint disease   . Gout    Past Surgical  History  Procedure Laterality Date  . Cardiac surgery    . Pacemaker placement     History reviewed. No pertinent family history. Social History   Social History  . Marital Status: Unknown    Spouse Name: N/A  . Number of Children: N/A  . Years of Education: N/A   Occupational History  . Not on file.   Social History Main Topics  . Smoking status: Former Research scientist (life sciences)  . Smokeless tobacco: Not on file  . Alcohol Use: Not on file  . Drug Use: Not on file  . Sexual Activity: Not on file   Other Topics Concern  . Not on file   Social History Narrative  . No narrative on file   Review of Systems: CONSTITUTIONAL- No Fever, or change in appetite. SKIN- No rash HEAD- No Headache or dizziness. Mouth/throat- No Sorethroat, dentures, or bleeding gums. GI- No nausea, vomiting, diarrhoea, constipation, abd pain. URINARY- No Frequency, or dysuria. NEUROLOGIC- No Numbness, or burning. [facility]- Denies depression or anxiety.  Physical Exam: Blood pressure 148/93, pulse 69, temperature 98.6 F (37 C), temperature source Oral, resp. rate 18, SpO2 95 %. GENERAL- alert, co-operative, appears as stated age, not in any distress, on 2L of O2 in the Ed, he is not on o2 at home. HEENT- Atraumatic, normocephalic, PERRL,  oral mucosa appears moist, neck supple. CARDIAC- Regular, no murmurs, rubs or gallops. RESP- Moving equal volumes of air, and clear to auscultation bilaterally, no wheezes or crackles appreciated. ABDOMEN- Soft, nontender, bowel sounds present. BACK- Normal curvature of the spine NEURO- No obvious Cr N abnormality, strenght upper and lower extremities EXTREMITIES- +2 pitting edema to mid leg, right knee- swollen with erythema and warmth, restricted range of motion, tender to touch. SKIN- Warm, dry, No rash or lesion. PSYCH- Normal mood and affect, appropriate thought content and speech.  Lab results: Basic Metabolic Panel:  Recent Labs  07/07/15 0739  NA 144  K 3.5  CL 98*   GLUCOSE 126*  BUN 32*  CREATININE 2.40*   Liver Function Tests:  Recent Labs  07/07/15 0739  AST 20  ALT 19  ALKPHOS 61  BILITOT 1.0  PROT 7.3  ALBUMIN 3.3*   CBC:  Recent Labs  07/07/15 0739  WBC 12.9*  NEUTROABS 10.0*  HGB 11.8*  14.3  HCT 38.7*  42.0  MCV 82.0  PLT 175   Imaging results:  Dg Chest 2 View  07/07/2015  CLINICAL DATA:  75 year old male with mid and left side chest pain and shortness of breath for 2 days. Former smoker. Initial encounter. EXAM: CHEST  2 VIEW COMPARISON:  None. FINDINGS: Left chest dual lead cardiac AICD  device. Moderate cardiomegaly. Somewhat low lung volumes. Other mediastinal contours are within normal limits. Visualized tracheal air column is within normal limits. No pneumothorax or pulmonary edema. No pleural effusion or consolidation. No acute osseous abnormality identified. IMPRESSION: Cardiomegaly and mildly low lung volumes. No acute cardiopulmonary abnormality. Electronically Signed   By: Genevie Ann M.D.   On: 07/07/2015 08:04   Dg Knee Complete 4 Views Right  07/07/2015  CLINICAL DATA:  Knee pain and swelling for 2 days.  No known injury. EXAM: RIGHT KNEE - COMPLETE 4+ VIEW COMPARISON:  None. FINDINGS: Moderate tricompartmental degenerative changes with joint space narrowing and osteophytic spurring. No acute fracture or osteochondral lesion. Soft tissue swelling noted over the patella and patellar tendon could be due to prepatellar bursitis. Recommend clinical correlation. IMPRESSION: Tricompartmental degenerative changes but no acute fracture. Prepatellar soft tissue swelling may suggest prepatellar bursitis. Electronically Signed   By: Marijo Sanes M.D.   On: 07/07/2015 08:04    Other results: EKG: Rate- 90 bpm, regular sinus rhythm, increased PR interval- 233, prolonged QRS-163, prolonged Qtc- 507.    Assessment & Plan by Problem: Active Problems:   CHF (congestive heart failure) (HCC)   Right knee pain   Pedal edema   Fluid  overload  Fluid overload- With new pitting peripheral edema, Pt has a hx of ischemic cardiomyopathy- Ef noted on Cardiology notes in care everywhere- 35-40%  ? When. Pt also has CKD- Cr- last 2.9 in 2015, Cr here- 2.4. BNP elevated at 2600, but pt has CKD,also unsure of baseline. Chest xray- without obvious congestion, IV lasix- 40mg  daily given in the ED. Pt is on typical heart failure meds- Bidil, Coreg 12.5, on lasix 20mg  TID  , and reports compliance. He does not appear markedly elevated,  - Admit to tele - Check weight - IV lasix 40mg  am - Daily weights, baseline weight of 190-200.  - Strict in and out - Trend trops with reported chest pain, and SOB yesterday. - Trops X3 - EKG am - On o2, wean off, and check sats  Knee swelling- Differentials- Gout, Bursitis, septic arthritis. Xray knee- soft tissue swelling suggesting bursitis. Possibly gout, as he as a hx and has not been compliant with therapy. Considering Normal temp, WBC- 12.9, will treat for possible gout first and then if pain is still present consider septic arthritis. - Prednisone- 40mg  daily, which would help for both gout and bursitis. - If swelling fails to improve today, then consider consult ortho for aspiration and antibiotics for septic arthritis  Atria Fib- Paroxysmal- pt is on Eliquis and coreg - Appears rate controlled - Cont Anticoag  COPD- possible cause of initial SOB, pt says he was wheezing. Chest exam unremarkable. - Cont Alb inh - Cont advair - Cont Spiriva  Cardiomyopathy, ischemic- EF- 35-40%, with ICD in place. - Cont aspirin and lipitor - Cont meds above  Dispo: Disposition is deferred at this time, awaiting improvement of current medical problems.  Signed: Bethena Roys, MD 07/07/2015, 12:29 PM

## 2015-07-07 NOTE — ED Notes (Signed)
Pt O2 sats intermittently drop to 84-85% while pt is resting.  O2 applied via nasal cannula at 2 liters.

## 2015-07-07 NOTE — ED Provider Notes (Signed)
CSN: [id number]     Arrival date & time 07/07/15  0701 History   First MD Initiated Contact with Patient 07/07/15 [phone]     No chief complaint on file.    (Consider location/radiation/quality/duration/timing/severity/associated sxs/prior Treatment) Patient is a 75 y.o. male presenting with shortness of breath. The history is provided by the patient (Patient complains of shortness of breath and swelling in his legs. Also some increased right knee pain.).  Shortness of Breath Severity:  Moderate Onset quality:  Sudden Timing:  Constant Progression:  Worsening Chronicity:  Recurrent Context: activity   Associated symptoms: no abdominal pain, no chest pain, no cough, no headaches and no rash     Past Medical History  Diagnosis Date  . Atrial fibrillation ([place])   . Hypertension   . Hypercholesteremia   . Renal disorder   . Coronary artery disease   . CHF (congestive heart failure) ([place])   . Degenerative joint disease   . Gout    Past Surgical History  Procedure Laterality Date  . Cardiac surgery    . Pacemaker placement     History reviewed. No pertinent family history. Social History  Substance Use Topics  . Smoking status: Former Research scientist (life sciences)  . Smokeless tobacco: None  . Alcohol Use: None    Review of Systems  Constitutional: Negative for appetite change and fatigue.  HENT: Negative for congestion, ear discharge and sinus pressure.   Eyes: Negative for discharge.  Respiratory: Positive for shortness of breath. Negative for cough.   Cardiovascular: Negative for chest pain.  Gastrointestinal: Negative for abdominal pain and diarrhea.  Genitourinary: Negative for frequency and hematuria.  Musculoskeletal: Negative for back pain.       Right knee pain  Skin: Negative for rash.  Neurological: Negative for seizures and headaches.  Psychiatric/Behavioral: Negative for hallucinations.      Allergies  Hymenoptera venom preparations; Lisinopril; Piroxicam; and  Amoxicillin-pot clavulanate  Home Medications   Prior to Admission medications   Medication Sig Start Date End Date Taking? Authorizing Provider  albuterol (PROVENTIL HFA;VENTOLIN HFA) 108 (90 Base) MCG/ACT inhaler Inhale 1 puff into the lungs every 6 (six) hours as needed for wheezing or shortness of breath.   Yes Historical Provider, MD  allopurinol (ZYLOPRIM) 300 MG tablet Take 300 mg by mouth daily as needed. For gout   Yes Historical Provider, MD  apixaban (ELIQUIS) 2.5 MG TABS tablet Take 5 mg by mouth daily.    Yes Historical Provider, MD  aspirin 325 MG tablet Take 325 mg by mouth daily.   Yes Historical Provider, MD  atorvastatin (LIPITOR) 20 MG tablet Take 20 mg by mouth daily.   Yes Historical Provider, MD  calcitRIOL (ROCALTROL) 0.25 MCG capsule Take 0.25 mcg by mouth daily.   Yes Historical Provider, MD  carvedilol (COREG) 12.5 MG tablet Take 25 mg by mouth daily.    Yes Historical Provider, MD  colchicine 0.6 MG tablet Take 0.6 mg by mouth daily.   Yes Historical Provider, MD  diclofenac sodium (VOLTAREN) 1 % GEL Apply 2 g topically 4 (four) times daily.   Yes Historical Provider, MD  digoxin (LANOXIN) 0.125 MG tablet Take 0.125 mg by mouth daily.   Yes Historical Provider, MD  Fluticasone-Salmeterol (ADVAIR) 250-50 MCG/DOSE AEPB Inhale 1 puff into the lungs 2 (two) times daily.   Yes Historical Provider, MD  furosemide (LASIX) 20 MG tablet Take 20 mg by mouth 3 (three) times daily.   Yes Historical Provider, MD  isosorbide-hydrALAZINE (BIDIL) 20-37.5 MG  tablet Take 1 tablet by mouth 3 (three) times daily.   Yes Historical Provider, MD  tiotropium (SPIRIVA) 18 MCG inhalation capsule Place 18 mcg into inhaler and inhale daily.   Yes Historical Provider, MD   BP 137/95 mmHg  Pulse 81  Temp(Src) 98.6 F (37 C) (Oral)  Resp 18  SpO2 94% Physical Exam  Constitutional: He is oriented to person, place, and time. He appears well-developed.  HENT:  Head: Normocephalic.  Eyes:  Conjunctivae and EOM are normal. No scleral icterus.  Neck: Neck supple. No thyromegaly present.  Cardiovascular: Normal rate and regular rhythm.  Exam reveals no gallop and no friction rub.   No murmur heard. Pulmonary/Chest: No stridor. He has no wheezes. He has no rales. He exhibits no tenderness.  Abdominal: He exhibits no distension. There is no tenderness. There is no rebound.  Musculoskeletal: He exhibits edema and tenderness.  Patient has 3+ edema bilaterally up to his knees.  He also has tenderness over his patella  Lymphadenopathy:    He has no cervical adenopathy.  Neurological: He is oriented to person, place, and time. He exhibits normal muscle tone. Coordination normal.  Skin: No rash noted. No erythema.  Psychiatric: He has a normal mood and affect. His behavior is normal.    ED Course  Procedures (including critical care time) Labs Review Labs Reviewed  CBC WITH DIFFERENTIAL/PLATELET - Abnormal; Notable for the following:    WBC 12.9 (*)    Hemoglobin 11.8 (*)    HCT 38.7 (*)    MCH 25.0 (*)    RDW 16.4 (*)    Neutro Abs 10.0 (*)    Monocytes Absolute 1.3 (*)    All other components within normal limits  HEPATIC FUNCTION PANEL - Abnormal; Notable for the following:    Albumin 3.3 (*)    All other components within normal limits  BRAIN NATRIURETIC PEPTIDE - Abnormal; Notable for the following:    B Natriuretic Peptide 2600.9 (*)    All other components within normal limits  I-STAT CHEM 8, ED - Abnormal; Notable for the following:    Chloride 98 (*)    BUN 32 (*)    Creatinine, Ser 2.40 (*)    Glucose, Bld 126 (*)    All other components within normal limits  I-STAT TROPOININ, ED    Imaging Review Dg Chest 2 View  07/07/2015  CLINICAL DATA:  75 year old male with mid and left side chest pain and shortness of breath for 2 days. Former smoker. Initial encounter. EXAM: CHEST  2 VIEW COMPARISON:  None. FINDINGS: Left chest dual lead cardiac AICD device. Moderate  cardiomegaly. Somewhat low lung volumes. Other mediastinal contours are within normal limits. Visualized tracheal air column is within normal limits. No pneumothorax or pulmonary edema. No pleural effusion or consolidation. No acute osseous abnormality identified. IMPRESSION: Cardiomegaly and mildly low lung volumes. No acute cardiopulmonary abnormality. Electronically Signed   By: Genevie Ann M.D.   On: 07/07/2015 08:04   Dg Knee Complete 4 Views Right  07/07/2015  CLINICAL DATA:  Knee pain and swelling for 2 days.  No known injury. EXAM: RIGHT KNEE - COMPLETE 4+ VIEW COMPARISON:  None. FINDINGS: Moderate tricompartmental degenerative changes with joint space narrowing and osteophytic spurring. No acute fracture or osteochondral lesion. Soft tissue swelling noted over the patella and patellar tendon could be due to prepatellar bursitis. Recommend clinical correlation. IMPRESSION: Tricompartmental degenerative changes but no acute fracture. Prepatellar soft tissue swelling may suggest prepatellar bursitis. Electronically  Signed   By: Marijo Sanes M.D.   On: 07/07/2015 08:04   I have personally reviewed and evaluated these images and lab results as part of my medical decision-making.   EKG Interpretation   Date/Time:  Friday July 07 2015 07:06:51 EST Ventricular Rate:  90 PR Interval:  233 QRS Duration: 163 QT Interval:  414 QTC Calculation: 507 R Axis:   -96 Text Interpretation:  Sinus rhythm Prolonged PR interval Nonspecific IVCD  with LAD Probable anteroseptal infarct, recent Confirmed by Raedyn Klinck  MD,  Anie Juniel [id number]) on 07/07/2015 9:39:16 AM      MDM   Final diagnoses:  Systolic congestive heart failure, unspecified congestive heart failure chronicity ([place])    Patient with congestive heart failure will admit for diuresis. He also has a bursitis to his right knee    Milton Ferguson, MD 07/07/15 1020

## 2015-07-08 DIAGNOSIS — Z7901 Long term (current) use of anticoagulants: Secondary | ICD-10-CM | POA: Diagnosis not present

## 2015-07-08 DIAGNOSIS — I255 Ischemic cardiomyopathy: Secondary | ICD-10-CM

## 2015-07-08 DIAGNOSIS — I5020 Unspecified systolic (congestive) heart failure: Secondary | ICD-10-CM | POA: Diagnosis not present

## 2015-07-08 DIAGNOSIS — Z7951 Long term (current) use of inhaled steroids: Secondary | ICD-10-CM

## 2015-07-08 DIAGNOSIS — I480 Paroxysmal atrial fibrillation: Secondary | ICD-10-CM | POA: Diagnosis not present

## 2015-07-08 DIAGNOSIS — M25561 Pain in right knee: Secondary | ICD-10-CM | POA: Diagnosis not present

## 2015-07-08 DIAGNOSIS — I130 Hypertensive heart and chronic kidney disease with heart failure and stage 1 through stage 4 chronic kidney disease, or unspecified chronic kidney disease: Secondary | ICD-10-CM | POA: Diagnosis not present

## 2015-07-08 DIAGNOSIS — M7989 Other specified soft tissue disorders: Secondary | ICD-10-CM | POA: Diagnosis not present

## 2015-07-08 DIAGNOSIS — N184 Chronic kidney disease, stage 4 (severe): Secondary | ICD-10-CM

## 2015-07-08 DIAGNOSIS — Z7982 Long term (current) use of aspirin: Secondary | ICD-10-CM

## 2015-07-08 DIAGNOSIS — J449 Chronic obstructive pulmonary disease, unspecified: Secondary | ICD-10-CM | POA: Diagnosis not present

## 2015-07-08 DIAGNOSIS — E8770 Fluid overload, unspecified: Secondary | ICD-10-CM

## 2015-07-08 DIAGNOSIS — I4891 Unspecified atrial fibrillation: Secondary | ICD-10-CM | POA: Diagnosis not present

## 2015-07-08 DIAGNOSIS — I5022 Chronic systolic (congestive) heart failure: Secondary | ICD-10-CM | POA: Diagnosis not present

## 2015-07-08 DIAGNOSIS — Z79899 Other long term (current) drug therapy: Secondary | ICD-10-CM

## 2015-07-08 DIAGNOSIS — Z95810 Presence of automatic (implantable) cardiac defibrillator: Secondary | ICD-10-CM

## 2015-07-08 LAB — BASIC METABOLIC PANEL
ANION GAP: 12 (ref 5–15)
BUN: 32 mg/dL — ABNORMAL HIGH (ref 6–20)
CALCIUM: 9.3 mg/dL (ref 8.9–10.3)
CO2: 28 mmol/L (ref 22–32)
CREATININE: 2.7 mg/dL — AB (ref 0.61–1.24)
Chloride: 100 mmol/L — ABNORMAL LOW (ref 101–111)
GFR, EST AFRICAN AMERICAN: 25 mL/min — AB (ref >60)
GFR, EST NON AFRICAN AMERICAN: 21 mL/min — AB (ref >60)
Glucose, Bld: 160 mg/dL — ABNORMAL HIGH (ref 65–99)
Potassium: 4 mmol/L (ref 3.5–5.1)
SODIUM: 140 mmol/L (ref 135–145)

## 2015-07-08 LAB — CBC
HEMATOCRIT: 36.8 % — AB (ref 39.0–52.0)
Hemoglobin: 11.5 g/dL — ABNORMAL LOW (ref 13.0–17.0)
MCH: 25.5 pg — ABNORMAL LOW (ref 26.0–34.0)
MCHC: 31.3 g/dL (ref 30.0–36.0)
MCV: 81.6 fL (ref 78.0–100.0)
PLATELETS: 175 10*3/uL (ref 150–400)
RBC: 4.51 MIL/uL (ref 4.22–5.81)
RDW: 16.5 % — AB (ref 11.5–15.5)
WBC: 13.5 10*3/uL — ABNORMAL HIGH (ref 4.0–10.5)

## 2015-07-08 LAB — TROPONIN I
TROPONIN I: 0.08 ng/mL — AB (ref <0.031)
Troponin I: 0.05 ng/mL — ABNORMAL HIGH (ref <0.031)

## 2015-07-08 MED ORDER — FUROSEMIDE 40 MG PO TABS: 40.0000 mg | ORAL_TABLET | Freq: Two times a day (BID) | ORAL | Status: DC

## 2015-07-08 MED ORDER — APIXABAN 2.5 MG PO TABS: 2.5000 mg | ORAL_TABLET | Freq: Two times a day (BID) | ORAL | Status: DC

## 2015-07-08 MED ORDER — APIXABAN 5 MG PO TABS
5.0000 mg | ORAL_TABLET | Freq: Two times a day (BID) | ORAL | Status: DC
  Administered 2015-07-08 – 2015-07-09 (×2): 5 mg via ORAL
  Filled 2015-07-08 (×2): qty 1

## 2015-07-08 MED ORDER — FUROSEMIDE 40 MG PO TABS
40.0000 mg | ORAL_TABLET | Freq: Two times a day (BID) | ORAL | Status: DC
  Administered 2015-07-08 – 2015-07-09 (×2): 40 mg via ORAL
  Filled 2015-07-08 (×2): qty 1

## 2015-07-08 NOTE — Progress Notes (Signed)
PT Cancellation Note  Patient Details Name: Anthony Foster MRN: [id number] DOB: Nov 23, 1940   Cancelled Treatment:    Reason Eval/Treat Not Completed: Patient declined, no reason specified Refused to work with therapy today. States "I know I'm not leaving until Monday." Explained role of PT. Still refuses. Will try again.  Ellouise Newer 07/08/2015, 6:06 PM Elayne Snare, [place]

## 2015-07-08 NOTE — Progress Notes (Signed)
Subjective: Pt states breathing has improved but he wants to stay another day for diuresis.   His right knee feels about the same in regards to pain as it did yesterday but he is able to move it more. He lives with his grandchildren at home and is independent in all his ADLs. Is agreeable to working w/ PT today.   Objective: Vital signs in last 24 hours: Filed Vitals:   07/08/15 0500 07/08/15 1024 07/08/15 1047 07/08/15 1112  BP:  122/54    Pulse:  75  78  Temp:  98 F (36.7 C)    TempSrc:  Oral    Resp:  18    Height:      Weight: 164 lb 8 oz (74.617 kg)     SpO2:  96% 94%    Weight change:   Intake/Output Summary (Last 24 hours) at 07/08/15 1201 Last data filed at 07/08/15 1024  Gross per 24 hour  Intake    220 ml  Output    850 ml  Net   -630 ml   Physical Exam  Constitutional: He appears well-developed and well-nourished. No distress.  HENT:  Head: Normocephalic.  Mouth/Throat: No oropharyngeal exudate.  Cardiovascular: Normal rate and regular rhythm.  Exam reveals no gallop and no friction rub.   No murmur heard. Pulmonary/Chest: Effort normal and breath sounds normal. No respiratory distress. He has no wheezes. He has no rales.  Musculoskeletal:  1+ pedal edema b/l, right knee mildly erythematous, warm, and painful to palpation, however he is moving he right knee more compared to yesterday, left knee is non tender  Skin: He is not diaphoretic.    Lab Results: Basic Metabolic Panel:  Recent Labs Lab 07/07/15 2247 07/08/15 0445  NA 140 140  K 4.3 4.0  CL 99* 100*  CO2 31 28  GLUCOSE 133* 160*  BUN 29* 32*  CREATININE 2.73* 2.70*  CALCIUM 9.6 9.3   Liver Function Tests:  Recent Labs Lab 07/07/15 0739  AST 20  ALT 19  ALKPHOS 61  BILITOT 1.0  PROT 7.3  ALBUMIN 3.3*   CBC:  Recent Labs Lab 07/07/15 0739 07/08/15 0909  WBC 12.9* 13.5*  NEUTROABS 10.0*  --   HGB 11.8*  14.3 11.5*  HCT 38.7*  42.0 36.8*  MCV 82.0 81.6  PLT 175 175    Cardiac Enzymes:  Recent Labs Lab 07/07/15 2213 07/08/15 0445  TROPONINI 0.08* 0.05*   Studies/Results: Dg Chest 2 View  07/07/2015  CLINICAL DATA:  75 year old male with mid and left side chest pain and shortness of breath for 2 days. Former smoker. Initial encounter. EXAM: CHEST  2 VIEW COMPARISON:  None. FINDINGS: Left chest dual lead cardiac AICD device. Moderate cardiomegaly. Somewhat low lung volumes. Other mediastinal contours are within normal limits. Visualized tracheal air column is within normal limits. No pneumothorax or pulmonary edema. No pleural effusion or consolidation. No acute osseous abnormality identified. IMPRESSION: Cardiomegaly and mildly low lung volumes. No acute cardiopulmonary abnormality. Electronically Signed   By: Genevie Ann M.D.   On: 07/07/2015 08:04   Dg Knee Complete 4 Views Right  07/07/2015  CLINICAL DATA:  Knee pain and swelling for 2 days.  No known injury. EXAM: RIGHT KNEE - COMPLETE 4+ VIEW COMPARISON:  None. FINDINGS: Moderate tricompartmental degenerative changes with joint space narrowing and osteophytic spurring. No acute fracture or osteochondral lesion. Soft tissue swelling noted over the patella and patellar tendon could be due to prepatellar bursitis. Recommend clinical  correlation. IMPRESSION: Tricompartmental degenerative changes but no acute fracture. Prepatellar soft tissue swelling may suggest prepatellar bursitis. Electronically Signed   By: Marijo Sanes M.D.   On: 07/07/2015 08:04   Medications: I have reviewed the patient's current medications. Scheduled Meds: . apixaban  5 mg Oral Daily  . aspirin EC  81 mg Oral Daily  . atorvastatin  20 mg Oral Daily  . calcitRIOL  0.25 mcg Oral Daily  . carvedilol  25 mg Oral Daily  . diclofenac sodium  2 g Topical QID  . digoxin  0.125 mg Oral Daily  . furosemide  40 mg Oral BID  . isosorbide-hydrALAZINE  1 tablet Oral TID  . mometasone-formoterol  2 puff Inhalation BID  . predniSONE  50 mg  Oral Q breakfast  . tiotropium  18 mcg Inhalation Daily   Continuous Infusions:  PRN Meds:.acetaminophen-codeine, albuterol Assessment/Plan: Active Problems:   CHF (congestive heart failure) (HCC)   Right knee pain   Pedal edema   Fluid overload   Atrial fibrillation (HCC)   Gout   ICD (implantable cardioverter-defibrillator) in place   Fluid overload- BNP elevated at 2600 and creatinine around b/l on admission. He has received 2 dose of IV lasix 40mg  with net -1.3L since admission. Weight is unchanged. Per care everywhere his weight is around b/l. He appears very mildly hypervolemic w/ 1+ pedal edema however improved from prior day, likely with GFR <30 he will need to be sent home w/ increase in home lasix dose. Sating at 94% on room air. Overall improving, will keep an additional day for diuresis and to transition to po lasix.  - received dose of IV lasix 40mg  today, will transition to lasix 40mg  po BID  - Daily weights, Strict in and out - pt just recently moved from [place], [place] and needs to establish care w/ PCP, cardiologist, and nephro. Consulted CM to assist with this.  - PT consulted   Knee swelling- Differentials include Gout, Bursitis, septic arthritis. Xray right knee + soft tissue swelling suggesting bursitis. Possibly gout, as he as a hx and has not been compliant with allopurinol and colchicine. - Prednisone 50mg , will continue to monitor, if not improving can tap knee joint. Unlikely septic arthritis, leukocytosis likely due to gout flare and prednisone.  Atria Fib- Paroxysmal- continue Eliquis and coreg  COPD-  - Cont Alb inh, advair and Spiriva  Cardiomyopathy, ischemic- EF- 35-40%, with ICD in place. - Cont aspirin and lipitor - Cont meds above  Dispo: Disposition is deferred at this time, awaiting improvement of current medical problems.   .Services Needed at time of discharge: Y = Yes, Blank = No PT:   OT:   RN:   Equipment:   Other:     LOS: 1 day    Norman Herrlich, MD 07/08/2015, 12:01 PM

## 2015-07-09 MED ORDER — PREDNISONE 50 MG PO TABS: 50.0000 mg | ORAL_TABLET | Freq: Every day | ORAL | Status: DC

## 2015-07-09 MED ORDER — FUROSEMIDE 40 MG PO TABS: 40.0000 mg | ORAL_TABLET | Freq: Two times a day (BID) | ORAL | Status: DC

## 2015-07-09 NOTE — Progress Notes (Signed)
PT Cancellation Note  Patient Details Name: Anthony Foster MRN: [id number] DOB: [birth year]   Cancelled Treatment:    Reason Eval/Treat Not Completed: Patient declined, no reason specified.   Patient reports he has been up walking on his own with no assistive device.  Also reports his Rt knee is "better".  Patient declines PT services at this time.  Encouraged patient to contact MD if knee pain does not improve or worsens.  PT will sign off.   Despina Pole 07/09/2015, 12:23 PM Carita Pian. Sanjuana Kava, [place] Pager [phone]

## 2015-07-09 NOTE — Care Management Obs Status (Signed)
[place] NOTIFICATION   Patient Details  Name: Anthony Foster MRN: [id number] Date of Birth: 03-Feb-1941   Medicare Observation Status Notification Given:  Yes    Guido Sander, RN 07/09/2015, 12:09 PM

## 2015-07-09 NOTE — Progress Notes (Addendum)
Subjective: Pt has no complaints, states his right knee has gotten better. Refusing to work with PT, states he can walk on his own and not when he is forced to. Informed him that he is ready for discharge and he states he will try and make driving arrangements w/ his grandchildren.   Objective: Vital signs in last 24 hours: Filed Vitals:   07/08/15 2110 07/09/15 0500 07/09/15 0831 07/09/15 0835  BP:  140/71 140/70   Pulse: 79 74 73   Temp: 98.2 F (36.8 C) 97.8 F (36.6 C) 98.7 F (37.1 C)   TempSrc: Oral Oral Oral   Resp: 20 20 18    Height:      Weight: 166 lb 10.7 oz (75.6 kg)     SpO2: 97% 94% 96% 95%   Weight change: 2 lb 2.7 oz (0.983 kg)  Intake/Output Summary (Last 24 hours) at 07/09/15 1000 Last data filed at 07/09/15 0500  Gross per 24 hour  Intake    720 ml  Output    950 ml  Net   -230 ml   Physical Exam  Constitutional: He appears well-developed and well-nourished. No distress.  HENT:  Head: Normocephalic.  Mouth/Throat: No oropharyngeal exudate.  Cardiovascular: Normal rate and regular rhythm.  Exam reveals no gallop and no friction rub.   No murmur heard. Pulmonary/Chest: Effort normal. No respiratory distress. He has no wheezes. He has rales (LLL base).  Abdominal: Soft. Bowel sounds are normal. He exhibits no distension. There is no tenderness. There is no rebound and no guarding.  Musculoskeletal:  right knee mildly erythematous, warm, and less tender to palpation than previous day.   Skin: He is not diaphoretic.    Lab Results: Basic Metabolic Panel:  Recent Labs Lab 07/07/15 2247 07/08/15 0445  NA 140 140  K 4.3 4.0  CL 99* 100*  CO2 31 28  GLUCOSE 133* 160*  BUN 29* 32*  CREATININE 2.73* 2.70*  CALCIUM 9.6 9.3   Liver Function Tests:  Recent Labs Lab 07/07/15 0739  AST 20  ALT 19  ALKPHOS 61  BILITOT 1.0  PROT 7.3  ALBUMIN 3.3*   CBC:  Recent Labs Lab 07/07/15 0739 07/08/15 0909  WBC 12.9* 13.5*  NEUTROABS 10.0*  --    HGB 11.8*  14.3 11.5*  HCT 38.7*  42.0 36.8*  MCV 82.0 81.6  PLT 175 175   Cardiac Enzymes:  Recent Labs Lab 07/07/15 2213 07/08/15 0445  TROPONINI 0.08* 0.05*   Studies/Results: No results found. Medications: I have reviewed the patient's current medications. Scheduled Meds: . apixaban  5 mg Oral BID  . aspirin EC  81 mg Oral Daily  . atorvastatin  20 mg Oral Daily  . calcitRIOL  0.25 mcg Oral Daily  . carvedilol  25 mg Oral Daily  . diclofenac sodium  2 g Topical QID  . digoxin  0.125 mg Oral Daily  . furosemide  40 mg Oral BID  . isosorbide-hydrALAZINE  1 tablet Oral TID  . mometasone-formoterol  2 puff Inhalation BID  . predniSONE  50 mg Oral Q breakfast  . tiotropium  18 mcg Inhalation Daily   Continuous Infusions:  PRN Meds:.acetaminophen-codeine, albuterol Assessment/Plan: Active Problems:   CHF (congestive heart failure) (HCC)   Right knee pain   Pedal edema   Fluid overload   Atrial fibrillation (HCC)   Gout   ICD (implantable cardioverter-defibrillator) in place   Fluid overload- diuresing well, net negative 1.46 L since admission.  - continue  lasix 40mg  po BID which is the dose he will be sent home on - pt just recently moved from [place], [place] and needs to establish care w/ PCP, cardiologist, and nephro. Consulted CM to assist with this. Called CM again this morning and left a voicemail requesting call back to coordinate hospital f/u.  - PT re consulted this morning.   Gout- pt has rt knee swelling that has improved with steroids, like gout vs bursitis.  - Prednisone 50mg , will continue for 2 more days.   Atria Fib- Paroxysmal- continue Eliquis and coreg  COPD-  - Cont Alb inh, advair and Spiriva  Cardiomyopathy, ischemic- EF- 35-40%, with ICD in place. - Cont aspirin and lipitor - Cont meds above  Dispo: Discharge home today.   .Services Needed at time of discharge: Y = Yes, Blank = No PT:   OT:   RN:   Equipment:   Other:      LOS: 2 days   Norman Herrlich, MD 07/09/2015, 10:00 AM

## 2015-07-09 NOTE — Care Management Note (Signed)
Case Management Note  Patient Details  Name: Anthony Foster MRN: [id number] Date of Birth: Dec 03, 1940  Subjective/Objective:                  Per H &P: Pt is a 75 Y O M with PMH- Atria fib, CAD, ischemic cardiomyopathy- Ef- 35-40%, CKD 4, Gout, presented to the ED with complaints of leg swelling and knee pain.   Action/Plan: Cm spoke to patient at the bedside who said that he just moved here from [place], [place]. He said that he has medicare/tricare and prefers not to use the [place] services so that he can save those resources for his fellow veterans. Patient said that he lives with his grandson and has enough support at home. Patient has a walker and a cane and declined any other DME.Patient does not want [facility] services. CM provided patient with number for [facility] to locate pcp, cardiologist, and nephrologist. Cm provided the number for [place]. CM providedPt is a 75 Y O M with PMH- Atria fib, CAD, ischemic cardiomyopathy- Ef- 35-40%, CKD 4, Gout, presented to the ED with complaints of leg swelling and knee pain. Pt says that he had a 5-65min episode of difficulty breathing when he woke up yesterday, patient with [facility] Persons PCP information and [facility] PCP. Patient declines any further needs at this time and said that he has no difficulty obtaining medications. CM remains available should additional needs arise.   Expected Discharge Date:  07/09/15               Expected Discharge Plan:  Home/Self Care  In-House Referral:     Discharge planning Services  CM Consult  Post Acute Care Choice:    Choice offered to:  Patient  DME Arranged:    DME Agency:     HH Arranged:    [place] Agency:     Status of Service:  Completed, signed off  Medicare Important Message Given:  Yes Date Medicare IM Given:    Medicare IM give by:    Date Additional Medicare IM Given:    Additional Medicare Important Message give by:     If discussed at [place] of Stay Meetings, dates  discussed:    Additional Comments:  Guido Sander, RN 07/09/2015, 12:10 PM

## 2015-07-09 NOTE — Progress Notes (Signed)
Pt discharge instructions given, pt verbalized understanding.  VSS. Denies pain. Pt to have family member transport home.

## 2015-07-09 NOTE — Discharge Summary (Signed)
Name: Anthony Foster MRN: [id number] DOB: [birth year] 75 y.o. PCP: [facility]  Date of Admission: 07/07/2015  7:01 AM Date of Discharge: 07/10/2015 Attending Physician: No att. providers found  Discharge Diagnosis: 1. Systolic Congestive Heart Failure (EF 35-40%) 2. CKD Stage IV 3. Volume Overload 4. Atrial Fibrillation  5. Gout 6. Ischemic Cardiomyopathy  Discharge Medications:   Medication List    STOP taking these medications        allopurinol 300 MG tablet  Commonly known as:  ZYLOPRIM     colchicine 0.6 MG tablet      TAKE these medications        albuterol 108 (90 Base) MCG/ACT inhaler  Commonly known as:  PROVENTIL HFA;VENTOLIN HFA  Inhale 1 puff into the lungs every 6 (six) hours as needed for wheezing or shortness of breath.     apixaban 2.5 MG Tabs tablet  Commonly known as:  ELIQUIS  Take 5 mg by mouth daily.     aspirin 325 MG tablet  Take 325 mg by mouth daily.     atorvastatin 20 MG tablet  Commonly known as:  LIPITOR  Take 20 mg by mouth daily.     calcitRIOL 0.25 MCG capsule  Commonly known as:  ROCALTROL  Take 0.25 mcg by mouth daily.     carvedilol 12.5 MG tablet  Commonly known as:  COREG  Take 25 mg by mouth daily.     diclofenac sodium 1 % Gel  Commonly known as:  VOLTAREN  Apply 2 g topically 4 (four) times daily.     digoxin 0.125 MG tablet  Commonly known as:  LANOXIN  Take 0.125 mg by mouth daily.     Fluticasone-Salmeterol 250-50 MCG/DOSE Aepb  Commonly known as:  ADVAIR  Inhale 1 puff into the lungs 2 (two) times daily.     furosemide 40 MG tablet  Commonly known as:  LASIX  Take 1 tablet (40 mg total) by mouth 2 (two) times daily.     isosorbide-hydrALAZINE 20-37.5 MG tablet  Commonly known as:  BIDIL  Take 1 tablet by mouth 3 (three) times daily.     predniSONE 50 MG tablet  Commonly known as:  DELTASONE  Take 1 tablet (50 mg total) by mouth daily with breakfast.     tiotropium 18 MCG inhalation  capsule  Commonly known as:  SPIRIVA  Place 18 mcg into inhaler and inhale daily.        Disposition and follow-up:   Mr.Anthony Foster was discharged from [facility] in stable condition.  At the hospital follow up visit please address:  1.  Please address medication adherence and resolution of right knee pain.   2.  Labs / imaging needed at time of follow-up: None  3.  Pending labs/ test needing follow-up: None Follow-up Information    Follow up with [facility]. Schedule an appointment as soon as possible for a visit in 1 week.   Contact information:   Please call the number for medicare to find Primary Care Provider and Cardiologist.   You may also call [facility] to locate primary care provider and cardiologist. The number to call is [phone].      Schedule an appointment as soon as possible for a visit with [place].   Contact information:   Call to establish care within 2 weeks.   Call [facility].       Schedule an appointment as soon as possible for  a visit with Sherril Croon, MD.   Specialty:  Nephrology   Why:  Nephrologist-Follow up in 1-2 weeks.    Contact information:   [place] [address] [phone]       Procedures Performed:  Dg Chest 2 View  07/07/2015  CLINICAL DATA:  75 year old male with mid and left side chest pain and shortness of breath for 2 days. Former smoker. Initial encounter. EXAM: CHEST  2 VIEW COMPARISON:  None. FINDINGS: Left chest dual lead cardiac AICD device. Moderate cardiomegaly. Somewhat low lung volumes. Other mediastinal contours are within normal limits. Visualized tracheal air column is within normal limits. No pneumothorax or pulmonary edema. No pleural effusion or consolidation. No acute osseous abnormality identified. IMPRESSION: Cardiomegaly and mildly low lung volumes. No acute cardiopulmonary abnormality. Electronically Signed   By: Genevie Ann M.D.   On: 07/07/2015  08:04   Dg Knee Complete 4 Views Right  07/07/2015  CLINICAL DATA:  Knee pain and swelling for 2 days.  No known injury. EXAM: RIGHT KNEE - COMPLETE 4+ VIEW COMPARISON:  None. FINDINGS: Moderate tricompartmental degenerative changes with joint space narrowing and osteophytic spurring. No acute fracture or osteochondral lesion. Soft tissue swelling noted over the patella and patellar tendon could be due to prepatellar bursitis. Recommend clinical correlation. IMPRESSION: Tricompartmental degenerative changes but no acute fracture. Prepatellar soft tissue swelling may suggest prepatellar bursitis. Electronically Signed   By: Marijo Sanes M.D.   On: 07/07/2015 08:04    Admission HPI: Pt is a 75 Y O M with PMH- Atria fib, CAD, ischemic cardiomyopathy- Ef- 35-40%, CKD 4, Gout, presented to the ED with complaints of leg swelling and knee pain. Pt says that he had a 5-66min episode of difficulty breathing when he woke up yesterday, Bilateral Leg swelling- noticed by patient yesterday. He also complains of right knee pain that started ~3 days ago, sudden, with associated redness, and pain with mobility or bearing weight. He denies fever, chills, vomiting. He has a hx of gout, med list has colchicine and allorpurinol, which he says he takes as needed. He last took both meds- ~1 week ago. He normally has gout on his feet. Patient also mentioned SOB to Ed providers, on questioning he says he felt sob yesterday for about 11mins when he woke up, but is not short of breath today, he says he has always used 2 pillows to sleep, checks his weight regularly, and range- 190-200, last was 197 three days ago. He has cough productive of minimal sputum, he also says he has wheezing. He says he has some difficulty breathing when he doing certain activity like bowling. He denies chest pain, but apparently said otherwise in the ED. Pt quit smoking 3 years ago, but previously smoked 3PPD, started in his teens. He used to live in  [place], [place] up at [employer] with Cardiology, nephrology and had a PCP. Marland Kitchen He has relocated to [facility] permanently, but has not established care.   Hospital Course by problem list:   Fluid overload secondary to CKD Stage IV and CHF (EF 35-40%): Patient with diuresed with IV furosemide BID 40 mg with net -1.7L. Pedal edema improved on exam. His dyspnea improved and was satting 94% on room air by discharge. Case management was consulted to assist him with finding a cardiologist and nephrologist, and he was provided a list of resources of physicians in the area that could meet his needs. Physical therapy attempted to work with him, but he declined.  Gout Flare: Right knee pain improved on 50 mg prednisone daily. Pain was managed with Tylenol#3 as needed.  Allopurinol and colchicine were held. Once his knee pain improved, he was told to restart his allopurinol only, to be taken daily.   Paroxyxmal Atrial Fibrillation: Stable during hospitalization. Continued on home Eliquis and coreg.   COPD: Stable during hospitalization. Continued on home Alb inh, advair and Spiriva  Cardiomyopathy, ischemic with ICD in place: Continued on home aspirin and lipidor   Discharge Vitals:   BP 140/70 mmHg  Pulse 73  Temp(Src) 98.7 F (37.1 C) (Oral)  Resp 18  Ht 5\' 8"  (1.727 m)  Wt 166 lb 10.7 oz (75.6 kg)  BMI 25.35 kg/m2  SpO2 95%  Discharge Labs:  No results found for this or any previous visit (from the past 24 hour(s)).  Signed: Liberty Handy, MD 07/10/2015, 1:42 PM

## 2015-07-09 NOTE — Progress Notes (Signed)
Pt has 8 beat run of wide QRS, not v-tach.  Patient has pacemaker/defib on demand

## 2015-07-09 NOTE — Discharge Instructions (Signed)

## 2015-07-10 LAB — HEMOGLOBIN A1C
Hgb A1c MFr Bld: 6.1 % — ABNORMAL HIGH (ref 4.8–5.6)
Mean Plasma Glucose: 128 mg/dL

## 2015-08-28 DIAGNOSIS — I509 Heart failure, unspecified: Secondary | ICD-10-CM | POA: Diagnosis not present

## 2015-08-28 DIAGNOSIS — I4891 Unspecified atrial fibrillation: Secondary | ICD-10-CM | POA: Diagnosis not present

## 2015-08-28 DIAGNOSIS — J449 Chronic obstructive pulmonary disease, unspecified: Secondary | ICD-10-CM | POA: Diagnosis not present

## 2015-08-28 DIAGNOSIS — M109 Gout, unspecified: Secondary | ICD-10-CM | POA: Diagnosis not present

## 2015-08-28 DIAGNOSIS — I10 Essential (primary) hypertension: Secondary | ICD-10-CM | POA: Diagnosis not present

## 2015-08-28 DIAGNOSIS — Z95810 Presence of automatic (implantable) cardiac defibrillator: Secondary | ICD-10-CM | POA: Diagnosis not present

## 2015-08-28 DIAGNOSIS — E7800 Pure hypercholesterolemia, unspecified: Secondary | ICD-10-CM | POA: Diagnosis not present

## 2015-08-28 DIAGNOSIS — N184 Chronic kidney disease, stage 4 (severe): Secondary | ICD-10-CM | POA: Diagnosis not present

## 2015-08-28 DIAGNOSIS — Z1389 Encounter for screening for other disorder: Secondary | ICD-10-CM | POA: Diagnosis not present

## 2015-09-04 DIAGNOSIS — I5022 Chronic systolic (congestive) heart failure: Secondary | ICD-10-CM | POA: Diagnosis not present

## 2015-09-04 DIAGNOSIS — M79641 Pain in right hand: Secondary | ICD-10-CM | POA: Diagnosis not present

## 2015-09-04 DIAGNOSIS — R5383 Other fatigue: Secondary | ICD-10-CM | POA: Diagnosis not present

## 2015-09-04 DIAGNOSIS — M2550 Pain in unspecified joint: Secondary | ICD-10-CM | POA: Diagnosis not present

## 2015-09-04 DIAGNOSIS — M109 Gout, unspecified: Secondary | ICD-10-CM | POA: Diagnosis not present

## 2015-09-04 DIAGNOSIS — M79642 Pain in left hand: Secondary | ICD-10-CM | POA: Diagnosis not present

## 2015-09-04 DIAGNOSIS — M150 Primary generalized (osteo)arthritis: Secondary | ICD-10-CM | POA: Diagnosis not present

## 2015-09-04 DIAGNOSIS — N184 Chronic kidney disease, stage 4 (severe): Secondary | ICD-10-CM | POA: Diagnosis not present

## 2015-09-04 DIAGNOSIS — M5136 Other intervertebral disc degeneration, lumbar region: Secondary | ICD-10-CM | POA: Diagnosis not present

## 2015-09-07 NOTE — Progress Notes (Signed)
HPI: 75 year old male for evaluation of congestive heart failure. Previously cared for in [facility]. Patient was admitted in January 2017 with congestive heart failure. It was noted he had a history of atrial fibrillation, coronary artery disease, ischemic cardiomyopathy with ejection fraction 35-40%. Laboratories shows he has a creatinine of 3.30. LDL 142. Patient now denies dyspnea on exertion, orthopnea, PND, pedal edema, palpitations, syncope or chest pain.  Current Outpatient Prescriptions  Medication Sig Dispense Refill  . albuterol (PROVENTIL HFA;VENTOLIN HFA) 108 (90 Base) MCG/ACT inhaler Inhale 1 puff into the lungs every 6 (six) hours as needed for wheezing or shortness of breath.    Marland Kitchen apixaban (ELIQUIS) 2.5 MG TABS tablet Take 5 mg by mouth daily.     Marland Kitchen atorvastatin (LIPITOR) 20 MG tablet Take 20 mg by mouth daily.    . calcitRIOL (ROCALTROL) 0.25 MCG capsule Take 0.25 mcg by mouth daily.    . carvedilol (COREG) 12.5 MG tablet Take 25 mg by mouth daily.     . diclofenac sodium (VOLTAREN) 1 % GEL Apply 2 g topically 4 (four) times daily.    . digoxin (LANOXIN) 0.125 MG tablet Take 0.125 mg by mouth daily.    . Fluticasone-Salmeterol (ADVAIR) 250-50 MCG/DOSE AEPB Inhale 1 puff into the lungs 2 (two) times daily.    . furosemide (LASIX) 40 MG tablet Take 1 tablet (40 mg total) by mouth 2 (two) times daily. 60 tablet 3  . isosorbide-hydrALAZINE (BIDIL) 20-37.5 MG tablet Take 1 tablet by mouth 3 (three) times daily.    Marland Kitchen tiotropium (SPIRIVA) 18 MCG inhalation capsule Place 18 mcg into inhaler and inhale daily.     No current facility-administered medications for this visit.    Allergies  Allergen Reactions  . Hymenoptera Venom Preparations   . Lisinopril Other (See Comments)    angioedema  . Piroxicam Other (See Comments)    burn  . Amoxicillin-Pot Clavulanate Rash     Past Medical History  Diagnosis Date  . Atrial fibrillation ([place])   . Hypertension     . Hypercholesteremia   . Renal insufficiency   . Coronary artery disease     Prior stent  . CHF (congestive heart failure) ([place])   . Degenerative joint disease   . Gout   . Ischemic cardiomyopathy   . 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 Cardiac surgery    . Pacemaker placement    . Back surgery    . Total shoulder replacement    . Wrist surgery      Social History   Social History  . Marital Status: Divorced    Spouse Name: N/A  . Number of Children: 4  . Years of Education: N/A   Occupational History  . Not on file.   Social History Main Topics  . Smoking status: Former Research scientist (life sciences)  . Smokeless tobacco: Not on file  . Alcohol Use: 0.0 oz/week    0 Standard drinks or equivalent per week     Comment: Occasional  . Drug Use: Not on file  . Sexual Activity: Not on file   Other Topics Concern  . Not on file   Social History Narrative    Family History  Problem Relation Age of Onset  . Heart disease      No family history  . Diabetes Sister     ROS: no fevers or chills, productive cough, hemoptysis, dysphasia, odynophagia, melena,  hematochezia, dysuria, hematuria, rash, seizure activity, orthopnea, PND, pedal edema, claudication. Remaining systems are negative.  Physical Exam:   Blood pressure 146/80, pulse 87, height 5\' 8"  (1.727 m), weight 188 lb (85.276 kg).  General:  Well developed/well nourished in NAD Skin warm/dry Patient not depressed No peripheral clubbing Back-normal HEENT-normal/normal eyelids Neck supple/normal carotid upstroke bilaterally; no bruits; no JVD; no thyromegaly chest - CTA/ normal expansion CV - RRR/normal S1 and S2; no murmurs, rubs or gallops;  PMI nondisplaced Abdomen -NT/ND, no HSM, no mass, + bowel sounds, no bruit 2+ femoral pulses, no bruits Ext-no edema, chords, 2+ DP Neuro-grossly nonfocal  ECG 07/07/2015 sinus, first-degree AV block,  LBBB Electrocardiogram today shows sinus rhythm with first-degree AV block and left bundle branch block. Occasional ventricular pacing and PVCs

## 2015-09-12 ENCOUNTER — Telehealth: Payer: Self-pay | Admitting: Cardiology

## 2015-09-12 ENCOUNTER — Encounter: Payer: Self-pay | Admitting: Cardiology

## 2015-09-12 ENCOUNTER — Ambulatory Visit (INDEPENDENT_AMBULATORY_CARE_PROVIDER_SITE_OTHER): Payer: Medicare Other | Admitting: Cardiology

## 2015-09-12 VITALS — BP 146/80 | HR 87 | Ht 68.0 in | Wt 188.0 lb

## 2015-09-12 DIAGNOSIS — I480 Paroxysmal atrial fibrillation: Secondary | ICD-10-CM | POA: Diagnosis not present

## 2015-09-12 DIAGNOSIS — I4891 Unspecified atrial fibrillation: Secondary | ICD-10-CM

## 2015-09-12 DIAGNOSIS — Z9861 Coronary angioplasty status: Secondary | ICD-10-CM

## 2015-09-12 DIAGNOSIS — I2510 Atherosclerotic heart disease of native coronary artery without angina pectoris: Secondary | ICD-10-CM | POA: Diagnosis not present

## 2015-09-12 DIAGNOSIS — N289 Disorder of kidney and ureter, unspecified: Secondary | ICD-10-CM | POA: Insufficient documentation

## 2015-09-12 DIAGNOSIS — N189 Chronic kidney disease, unspecified: Secondary | ICD-10-CM | POA: Diagnosis not present

## 2015-09-12 DIAGNOSIS — I10 Essential (primary) hypertension: Secondary | ICD-10-CM | POA: Insufficient documentation

## 2015-09-12 DIAGNOSIS — E785 Hyperlipidemia, unspecified: Secondary | ICD-10-CM | POA: Insufficient documentation

## 2015-09-12 MED ORDER — HYDRALAZINE HCL 25 MG PO TABS: 25.0000 mg | ORAL_TABLET | Freq: Three times a day (TID) | ORAL | Status: DC

## 2015-09-12 MED ORDER — APIXABAN 5 MG PO TABS: 5.0000 mg | ORAL_TABLET | Freq: Two times a day (BID) | ORAL | Status: DC

## 2015-09-12 MED ORDER — APIXABAN 2.5 MG PO TABS: 5.0000 mg | ORAL_TABLET | Freq: Two times a day (BID) | ORAL | Status: DC

## 2015-09-12 MED ORDER — ATORVASTATIN CALCIUM 80 MG PO TABS: 80.0000 mg | ORAL_TABLET | Freq: Every day | ORAL | Status: DC

## 2015-09-12 MED ORDER — ISOSORBIDE MONONITRATE ER 30 MG PO TB24: 30.0000 mg | ORAL_TABLET | Freq: Every day | ORAL | Status: DC

## 2015-09-12 NOTE — Assessment & Plan Note (Signed)
Patient has a history of ischemic cardiomyopathy. Continue carvedilol. Given significant renal insufficiency we will not add an ACE inhibitor. Add hydralazine 25 mg 3 times a day and Imdur 30 mg daily. Titrate medications as needed. Continue present dose of Lasix. He is euvolemic on examination. Repeat echocardiogram.

## 2015-09-12 NOTE — Telephone Encounter (Signed)
Faxed signed Release to Dr Wilhelmenia Blase to obtain records per Dr Stanford Breed request.  Faxed on 09/12/15 to [phone]. lp

## 2015-09-12 NOTE — Assessment & Plan Note (Signed)
Patient with history of stent placement. I will obtain records from [place]. Continue statin. No aspirin given need for anticoagulation.

## 2015-09-12 NOTE — Assessment & Plan Note (Addendum)
Patient remains in sinus rhythm. Continue beta blocker. Given renal insufficiency I will discontinue digoxin. Continue apixaban (CHADSvasc 5). 5 Mg BID.

## 2015-09-12 NOTE — Assessment & Plan Note (Signed)
Refer to electrophysiology to follow his ICD.

## 2015-09-12 NOTE — Telephone Encounter (Signed)
Release of information to dr Lynelle Smoke given to medical records.

## 2015-09-12 NOTE — Assessment & Plan Note (Signed)
Given documented coronary disease increase Lipitor to 80 mg daily. Check lipids and liver in 4 weeks.

## 2015-09-12 NOTE — Patient Instructions (Addendum)
Medication Instructions:   STOP DIGOXIN  START HYDRALAZINE 25 MG ONE TABLET THREE TIMES DAILY  START ISOSORBIDE 30 MG ONCE DAILY  INCREASE ELIQUIS TO 5 MG TWICE DAILY  INCREASE ATORVASTATIN TO 80 MG ONCE DAILY  Labwork:  Your physician recommends that you return for lab work in: 4 WEEKS= DO NOT EAT PRIOR TO LAB WORK  Testing/Procedures:  Your physician has requested that you have an echocardiogram. Echocardiography is a painless test that uses sound waves to create images of your heart. It provides your doctor with information about the size and shape of your heart and how well your heart's chambers and valves are working. This procedure takes approximately one hour. There are no restrictions for this procedure.    Follow-Up:  Your physician recommends that you schedule a follow-up appointment in: 3 MONTHS WITH DR Apache FOR CHRONIC KIDNEY DISEASE  REFERRAL TO EP TO DISCUSS ICD

## 2015-09-12 NOTE — Assessment & Plan Note (Signed)
Blood pressure mildly elevated. We are adding hydralazine and nitrates both for blood pressure and ischemic cardiomyopathy.

## 2015-09-12 NOTE — Assessment & Plan Note (Signed)
Refer to nephrology given significant renal insufficiency. He will need to be followed long-term.

## 2015-09-12 NOTE — Telephone Encounter (Signed)
New message    Patient was seen today Calling back with additional information  Dr. Wilhelmenia Blase # [phone].

## 2015-09-13 ENCOUNTER — Telehealth: Payer: Self-pay | Admitting: Cardiology

## 2015-09-13 NOTE — Telephone Encounter (Signed)
Faxed Release signed by patient to Dr Thedora Hinders - [facility] [place] to obtain records a requested by Dr Stanford Breed. Faxed on 09/13/15. lp

## 2015-09-13 NOTE — Telephone Encounter (Signed)
Received records from [facility] as requested by Dr Stanford Breed.  Records given to Dr Stanford Breed to review. lp

## 2015-09-18 DIAGNOSIS — R768 Other specified abnormal immunological findings in serum: Secondary | ICD-10-CM | POA: Diagnosis not present

## 2015-09-20 ENCOUNTER — Encounter: Payer: Self-pay | Admitting: Internal Medicine

## 2015-09-20 ENCOUNTER — Ambulatory Visit (INDEPENDENT_AMBULATORY_CARE_PROVIDER_SITE_OTHER): Payer: Medicare Other | Admitting: Internal Medicine

## 2015-09-20 ENCOUNTER — Encounter: Payer: Medicare Other | Admitting: Cardiology

## 2015-09-20 VITALS — BP 90/56 | HR 72 | Ht 68.0 in | Wt 184.0 lb

## 2015-09-20 DIAGNOSIS — I5022 Chronic systolic (congestive) heart failure: Secondary | ICD-10-CM

## 2015-09-20 NOTE — Patient Instructions (Addendum)
Medication Instructions:  Your physician recommends that you continue on your current medications as directed. Please refer to the Current Medication list given to you today.   Labwork: none  Testing/Procedures: none  Follow-Up:  Remote monitoring is used to monitor your Pacemaker of ICD from home. This monitoring reduces the number of office visits required to check your device to one time per year. It allows [place] to keep an eye on the functioning of your device to ensure it is working properly. You are scheduled for a device check from home on 12/20/15. You may send your transmission at any time that day. If you have a wireless device, the transmission will be sent automatically. After your physician reviews your transmission, you will receive a postcard with your next transmission date.    Your physician wants you to follow-up in: 12 months.  You will receive a reminder letter in the mail two months in advance. If you don't receive a letter, please call our office to schedule the follow-up appointment.   Any Other Special Instructions Will Be Listed Below (If Applicable).     If you need a refill on your cardiac medications before your next appointment, please call your pharmacy.

## 2015-09-20 NOTE — Progress Notes (Signed)
HPI [name] is referred today by Dr. 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 He is a pleasant 75 yo man with an ICM, S/P ICD implant who has moved from the [place] part [place] our state to [place]. He has class 2 CHF symptoms. He has a h/o CAD and PAF. He is stable and denies peripheral edema. No ICD shock. Allergies  Allergen Reactions  . Hymenoptera Venom Preparations   . Lisinopril Other (See Comments)    angioedema  . Piroxicam Other (See Comments)    burn  . Amoxicillin-Pot Clavulanate Rash     Current Outpatient Prescriptions  Medication Sig Dispense Refill  . albuterol (PROVENTIL HFA;VENTOLIN HFA) 108 (90 Base) MCG/ACT inhaler Inhale 1 puff into the lungs every 6 (six) hours as needed for wheezing or shortness of breath.    . allopurinol (ZYLOPRIM) 300 MG tablet Take 150 mg by mouth daily.    Marland Kitchen apixaban (ELIQUIS) 5 MG TABS tablet Take 1 tablet (5 mg total) by mouth 2 (two) times daily. 60 tablet 6  . atorvastatin (LIPITOR) 80 MG tablet Take 1 tablet (80 mg total) by mouth daily. 30 tablet 6  . calcitRIOL (ROCALTROL) 0.25 MCG capsule Take 0.25 mcg by mouth daily.    . carvedilol (COREG) 12.5 MG tablet Take 25 mg by mouth daily.     . colchicine 0.6 MG tablet Take 0.6 mg by mouth daily as needed (gout).    Marland Kitchen diclofenac sodium (VOLTAREN) 1 % GEL Apply 2 g topically 4 (four) times daily.    . Fluticasone-Salmeterol (ADVAIR) 250-50 MCG/DOSE AEPB Inhale 1 puff into the lungs 2 (two) times daily.    . furosemide (LASIX) 40 MG tablet Take 1 tablet (40 mg total) by mouth 2 (two) times daily. 60 tablet 3  . hydrALAZINE (APRESOLINE) 25 MG tablet Take 1 tablet (25 mg total) by mouth 3 (three) times daily. 270 tablet 3  . isosorbide mononitrate (IMDUR) 30 MG 24 hr tablet Take 1 tablet (30 mg total) by mouth daily. 90 tablet 3  . tiotropium (SPIRIVA) 18 MCG inhalation capsule Place 18 mcg into inhaler and inhale daily.     No current  facility-administered medications for this visit.     Past Medical History  Diagnosis Date  . Atrial fibrillation ([place])   . Hypertension   . Hypercholesteremia   . Renal insufficiency   . Coronary artery disease     Prior stent  . CHF (congestive heart failure) ([place])   . Degenerative joint disease   . Gout   . Ischemic cardiomyopathy   . ICD (implantable cardioverter-defibrillator) battery depletion     ROS:   All systems reviewed and negative except as noted in the HPI.   Past Surgical History  Procedure Laterality Date  . Cardiac surgery    . Pacemaker placement    . Back surgery    . Total shoulder replacement    . Wrist surgery       Family History  Problem Relation Age of Onset  . Heart disease      No family history  . Diabetes Sister      Social History   Social History  . Marital Status: Divorced    Spouse Name: N/A  . Number of Children: 4  . Years of Education: N/A   Occupational History  . Not on file.   Social History Main Topics  . Smoking status: Former Research scientist (life sciences)  . Smokeless tobacco: Never  Used  . Alcohol Use: 0.0 oz/week    0 Standard drinks or equivalent per week     Comment: Occasional  . Drug Use: Not on file  . Sexual Activity: Not on file   Other Topics Concern  . Not on file   Social History Narrative     BP 90/56 mmHg  Pulse 72  Ht 5\' 8"  (1.727 m)  Wt 184 lb (83.462 kg)  BMI 27.98 kg/m2  Physical Exam:  Well appearing 75 yo man, NAD HEENT: Unremarkable Neck:  6 cm JVD, no thyromegally Lymphatics:  No adenopathy Back:  No CVA tenderness Lungs:  Clear with no wheezes.  HEART:  Regular rate rhythm, no murmurs, no rubs, no clicks Abd:  soft, positive bowel sounds, no organomegally, no rebound, no guarding Ext:  2 plus pulses, no edema, no cyanosis, no clubbing Skin:  No rashes no nodules Neuro:  CN II through XII intact, motor grossly intact  DEVICE  Normal device function.  See PaceArt for details.    Assess/Plan: 1. PAF - he is maintaining NSR over 99% of the time. He will continue his current meds. 2. Chronic systolic heart failure - his EF has previously been 35-40%. A 2D echo is pending. No change in CHF meds. 3. ICM - he is s/p MI. He is pain free. He is encouraged to increase his physical activity. 4. ICD - his [employer] DDD ICD is working normally except for an elevated atrial threshold. Will follow. I would anticipate placing a new atrial lead (and possibly an LV lead) when he reaches ERI.  Mikle Bosworth.D.

## 2015-09-26 DIAGNOSIS — M5136 Other intervertebral disc degeneration, lumbar region: Secondary | ICD-10-CM | POA: Diagnosis not present

## 2015-09-26 DIAGNOSIS — M109 Gout, unspecified: Secondary | ICD-10-CM | POA: Diagnosis not present

## 2015-09-26 DIAGNOSIS — R768 Other specified abnormal immunological findings in serum: Secondary | ICD-10-CM | POA: Diagnosis not present

## 2015-09-26 DIAGNOSIS — N184 Chronic kidney disease, stage 4 (severe): Secondary | ICD-10-CM | POA: Diagnosis not present

## 2015-09-26 DIAGNOSIS — M2550 Pain in unspecified joint: Secondary | ICD-10-CM | POA: Diagnosis not present

## 2015-09-26 DIAGNOSIS — R5383 Other fatigue: Secondary | ICD-10-CM | POA: Diagnosis not present

## 2015-09-26 DIAGNOSIS — M150 Primary generalized (osteo)arthritis: Secondary | ICD-10-CM | POA: Diagnosis not present

## 2015-09-26 DIAGNOSIS — L600 Ingrowing nail: Secondary | ICD-10-CM | POA: Diagnosis not present

## 2015-09-26 DIAGNOSIS — I5022 Chronic systolic (congestive) heart failure: Secondary | ICD-10-CM | POA: Diagnosis not present

## 2015-09-27 ENCOUNTER — Other Ambulatory Visit: Payer: Self-pay

## 2015-09-27 ENCOUNTER — Ambulatory Visit (HOSPITAL_COMMUNITY): Payer: Medicare Other | Attending: Cardiovascular Disease

## 2015-09-27 DIAGNOSIS — I071 Rheumatic tricuspid insufficiency: Secondary | ICD-10-CM | POA: Insufficient documentation

## 2015-09-27 DIAGNOSIS — I255 Ischemic cardiomyopathy: Secondary | ICD-10-CM | POA: Insufficient documentation

## 2015-09-27 DIAGNOSIS — I110 Hypertensive heart disease with heart failure: Secondary | ICD-10-CM | POA: Diagnosis not present

## 2015-09-27 DIAGNOSIS — Z87891 Personal history of nicotine dependence: Secondary | ICD-10-CM | POA: Diagnosis not present

## 2015-09-27 DIAGNOSIS — I2510 Atherosclerotic heart disease of native coronary artery without angina pectoris: Secondary | ICD-10-CM | POA: Diagnosis not present

## 2015-09-27 DIAGNOSIS — I509 Heart failure, unspecified: Secondary | ICD-10-CM | POA: Insufficient documentation

## 2015-09-27 DIAGNOSIS — E785 Hyperlipidemia, unspecified: Secondary | ICD-10-CM | POA: Diagnosis not present

## 2015-10-13 ENCOUNTER — Emergency Department (HOSPITAL_COMMUNITY)
Admission: EM | Admit: 2015-10-13 | Discharge: 2015-10-13 | Disposition: A | Discharge status: Home / Self Care | Payer: Medicare Other | Attending: Emergency Medicine | Admitting: Emergency Medicine

## 2015-10-13 ENCOUNTER — Encounter (HOSPITAL_COMMUNITY): Payer: Self-pay | Admitting: Emergency Medicine

## 2015-10-13 DIAGNOSIS — Z7901 Long term (current) use of anticoagulants: Secondary | ICD-10-CM | POA: Diagnosis not present

## 2015-10-13 DIAGNOSIS — Z79891 Long term (current) use of opiate analgesic: Secondary | ICD-10-CM | POA: Diagnosis not present

## 2015-10-13 DIAGNOSIS — Z79899 Other long term (current) drug therapy: Secondary | ICD-10-CM | POA: Insufficient documentation

## 2015-10-13 DIAGNOSIS — Z87891 Personal history of nicotine dependence: Secondary | ICD-10-CM | POA: Insufficient documentation

## 2015-10-13 DIAGNOSIS — M109 Gout, unspecified: Secondary | ICD-10-CM | POA: Insufficient documentation

## 2015-10-13 DIAGNOSIS — E878 Other disorders of electrolyte and fluid balance, not elsewhere classified: Secondary | ICD-10-CM | POA: Insufficient documentation

## 2015-10-13 DIAGNOSIS — I509 Heart failure, unspecified: Secondary | ICD-10-CM | POA: Insufficient documentation

## 2015-10-13 DIAGNOSIS — I110 Hypertensive heart disease with heart failure: Secondary | ICD-10-CM | POA: Diagnosis not present

## 2015-10-13 DIAGNOSIS — M79601 Pain in right arm: Secondary | ICD-10-CM | POA: Diagnosis not present

## 2015-10-13 DIAGNOSIS — I2510 Atherosclerotic heart disease of native coronary artery without angina pectoris: Secondary | ICD-10-CM | POA: Insufficient documentation

## 2015-10-13 DIAGNOSIS — Z950 Presence of cardiac pacemaker: Secondary | ICD-10-CM | POA: Insufficient documentation

## 2015-10-13 DIAGNOSIS — I4891 Unspecified atrial fibrillation: Secondary | ICD-10-CM | POA: Diagnosis not present

## 2015-10-13 DIAGNOSIS — E7800 Pure hypercholesterolemia, unspecified: Secondary | ICD-10-CM | POA: Diagnosis not present

## 2015-10-13 DIAGNOSIS — Z7952 Long term (current) use of systemic steroids: Secondary | ICD-10-CM | POA: Insufficient documentation

## 2015-10-13 DIAGNOSIS — Z7951 Long term (current) use of inhaled steroids: Secondary | ICD-10-CM | POA: Insufficient documentation

## 2015-10-13 DIAGNOSIS — R52 Pain, unspecified: Secondary | ICD-10-CM | POA: Diagnosis not present

## 2015-10-13 MED ORDER — HYDROCODONE-ACETAMINOPHEN 5-325 MG PO TABS: 1.0000 | ORAL_TABLET | Freq: Four times a day (QID) | ORAL | Status: DC | PRN

## 2015-10-13 MED ORDER — PREDNISONE 50 MG PO TABS: ORAL_TABLET | ORAL | Status: DC

## 2015-10-13 MED ORDER — HYDROCODONE-ACETAMINOPHEN 5-325 MG PO TABS
1.0000 | ORAL_TABLET | Freq: Once | ORAL | Status: AC
  Administered 2015-10-13: 1 via ORAL
  Filled 2015-10-13: qty 1

## 2015-10-13 NOTE — ED Provider Notes (Signed)
Point of pain in both knees and right wrist. Pain is typical of gout he's had in the past. Has pain with worse with walking. On exam patient is alert nontoxic right upper extremity exquisitely tender to light touch at wrist. The swelling or warmth. Lateral lower extremities without redness swelling or tenderness neurovascular intact.  Orlie Dakin, MD 10/13/15 [phone]

## 2015-10-13 NOTE — ED Notes (Signed)
Bed: WA02 Expected date:  Expected time:  Means of arrival:  Comments: EMS-gout/pain-move to room 2

## 2015-10-13 NOTE — ED Notes (Signed)
Patient here with complaint of bilateral left pain and right arm pain. Hx of gout. No relief.

## 2015-10-13 NOTE — ED Provider Notes (Signed)
CSN: [id number]     Arrival date & time 10/13/15  1256 History   First MD Initiated Contact with Patient 10/13/15 1328     Chief Complaint  Patient presents with  . Extremity Pain  . Gout   HPI  Anthony Foster is a 75 year old male with a past medical history of hypertension, A. fib on Eliquis, CAD, CHF, ischemic cardiomyopathy with ICD in place and gout presenting with joint pain. Patient states he "felt my gout coming on" approximately one week ago but it was mild and he did not treat it with Tylenol or Motrin. He states that it acutely worsened last evening. He complains of aching pain in his great toes, ankles, knees and right wrist. He states that the pain in his lower extremities is mild-moderate but the wrist pain is severe. He states he is unable to move his wrist secondary to pain. He endorses pain on ambulation but is still able to get around with a steady gait. He reports swelling of his right wrist but none in the other joints. He states this feels similar to his gout pain but he typically only has it in his lower extremities and has never had in his wrist before. He denies trauma or falls that could have injured his wrist. He was recently discontinued from his daily colchicine medication. He denies fevers, redness of the joints, warmth of the joints, myalgias or any other complaints at this time.  Past Medical History  Diagnosis Date  . Atrial fibrillation ([place])   . Hypertension   . Hypercholesteremia   . Renal insufficiency   . Coronary artery disease     Prior stent  . CHF (congestive heart failure) ([place])   . Degenerative joint disease   . Gout   . Ischemic cardiomyopathy   . 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 Cardiac surgery    . Pacemaker placement    . Back surgery    . Total shoulder replacement    . Wrist surgery     Family History  Problem Relation Age of Onset  . Heart disease       No family history  . Diabetes Sister    Social History  Substance Use Topics  . Smoking status: Former Research scientist (life sciences)  . Smokeless tobacco: Never Used  . Alcohol Use: 0.0 oz/week    0 Standard drinks or equivalent per week     Comment: Occasional    Review of Systems  All other systems reviewed and are negative.     Allergies  Hymenoptera venom preparations; Lisinopril; Piroxicam; and Amoxicillin-pot clavulanate  Home Medications   Prior to Admission medications   Medication Sig Start Date End Date Taking? Authorizing Provider  albuterol (PROVENTIL HFA;VENTOLIN HFA) 108 (90 Base) MCG/ACT inhaler Inhale 1 puff into the lungs every 6 (six) hours as needed for wheezing or shortness of breath.   Yes Historical Provider, MD  allopurinol (ZYLOPRIM) 300 MG tablet Take 150 mg by mouth daily.   Yes Historical Provider, MD  apixaban (ELIQUIS) 5 MG TABS tablet Take 1 tablet (5 mg total) by mouth 2 (two) times daily. 09/12/15  Yes Lelon Perla, MD  atorvastatin (LIPITOR) 80 MG tablet Take 1 tablet (80 mg total) by mouth daily. 09/12/15  Yes Lelon Perla, MD  calcitRIOL (ROCALTROL) 0.25 MCG capsule Take 0.25 mcg by mouth daily.   Yes Historical Provider, MD  carvedilol (COREG) 12.5 MG tablet Take 25  mg by mouth daily.    Yes Historical Provider, MD  diclofenac sodium (VOLTAREN) 1 % GEL Apply 2 g topically 4 (four) times daily.   Yes Historical Provider, MD  Fluticasone-Salmeterol (ADVAIR) 250-50 MCG/DOSE AEPB Inhale 1 puff into the lungs 2 (two) times daily.   Yes Historical Provider, MD  furosemide (LASIX) 40 MG tablet Take 1 tablet (40 mg total) by mouth 2 (two) times daily. 07/09/15  Yes Norman Herrlich, MD  hydrALAZINE (APRESOLINE) 25 MG tablet Take 1 tablet (25 mg total) by mouth 3 (three) times daily. 09/12/15  Yes Lelon Perla, MD  isosorbide mononitrate (IMDUR) 30 MG 24 hr tablet Take 1 tablet (30 mg total) by mouth daily. 09/12/15  Yes Lelon Perla, MD  tiotropium (SPIRIVA) 18  MCG inhalation capsule Place 18 mcg into inhaler and inhale daily.   Yes Historical Provider, MD  HYDROcodone-acetaminophen (NORCO/VICODIN) 5-325 MG tablet Take 1 tablet by mouth every 6 (six) hours as needed. 10/13/15   Lahoma Crocker Stefano Trulson, PA-C  predniSONE (DELTASONE) 50 MG tablet Take one tablet once a day for 3 days 10/13/15   Lee Kalt, PA-C   BP 140/89 mmHg  Pulse 85  Temp(Src) 97.6 F (36.4 C) (Oral)  Resp 20  SpO2 100% Physical Exam  Constitutional: He appears well-developed and well-nourished. No distress.  HENT:  Head: Normocephalic and atraumatic.  Right Ear: External ear normal.  Left Ear: External ear normal.  Eyes: Conjunctivae are normal. Right eye exhibits no discharge. Left eye exhibits no discharge. No scleral icterus.  Neck: Normal range of motion.  Cardiovascular: Normal rate and intact distal pulses.   Pulmonary/Chest: Effort normal.  Musculoskeletal:       Right wrist: He exhibits decreased range of motion, tenderness and swelling. He exhibits no deformity.  Right wrist is exquisitely TTP. Limited ROM due to pain. Very mild swelling noted without erythema or warmth. No tenderness over right elbow or LUE. No tenderness to palpation of lower extremity joints including knees, ankles and MTPs. No swelling, erythema or warmth. FROM of BLE.   Neurological: He is alert. Coordination normal.  5/5 strength of BLE. Deferred strength testing of RUE due to pain but pt is able to move digits freely. 5/5 strength of LUE. Sensation to light touch intact throughout.   Skin: Skin is warm and dry.  Psychiatric: He has a normal mood and affect. His behavior is normal.  Nursing note and vitals reviewed.   ED Course  Procedures (including critical care time) Labs Review Labs Reviewed - No data to display  Imaging Review No results found. I have personally reviewed and evaluated these images and lab results as part of my medical decision-making.   EKG Interpretation None       MDM   Final diagnoses:  Acute gout of right wrist, unspecified cause   75 year old male presenting with joint pain right wrist > lower extremity joints x 1 day. Pt reports this is typical of his gout. Afebrile and nontoxic appearing. Right wrist is exquisitely TTP. No erythema or warmth suggestion septic joint. No TTP, swelling, erythema or warmth of lower extremity joints. All extremities are neurovascularly intact. Will discharge with prednisone burst x 3 days and short course of pain medication. Pt is to follow up with PCP in 2-3 days for recheck and discuss if he needs to go back on daily gout rx. Return precautions given in discharge paperwork and discussed with pt at bedside. Pt stable for discharge  Josephina Gip, PA-C 10/13/15 [place], MD 10/14/15 [phone]

## 2015-10-13 NOTE — Discharge Instructions (Signed)

## 2015-10-17 ENCOUNTER — Telehealth: Payer: Self-pay | Admitting: Cardiology

## 2015-10-17 NOTE — Telephone Encounter (Signed)
Received records from [facility] as requested by Dr Stanford Breed.  Records given to Dr Stanford Breed to review. lp

## 2015-10-18 DIAGNOSIS — M109 Gout, unspecified: Secondary | ICD-10-CM | POA: Diagnosis not present

## 2015-10-18 DIAGNOSIS — Z0000 Encounter for general adult medical examination without abnormal findings: Secondary | ICD-10-CM | POA: Diagnosis not present

## 2015-10-18 DIAGNOSIS — E7800 Pure hypercholesterolemia, unspecified: Secondary | ICD-10-CM | POA: Diagnosis not present

## 2015-10-18 DIAGNOSIS — I10 Essential (primary) hypertension: Secondary | ICD-10-CM | POA: Diagnosis not present

## 2015-10-18 DIAGNOSIS — Z1389 Encounter for screening for other disorder: Secondary | ICD-10-CM | POA: Diagnosis not present

## 2015-10-18 DIAGNOSIS — N184 Chronic kidney disease, stage 4 (severe): Secondary | ICD-10-CM | POA: Diagnosis not present

## 2015-10-18 DIAGNOSIS — R739 Hyperglycemia, unspecified: Secondary | ICD-10-CM | POA: Diagnosis not present

## 2015-10-18 DIAGNOSIS — I4891 Unspecified atrial fibrillation: Secondary | ICD-10-CM | POA: Diagnosis not present

## 2015-10-18 DIAGNOSIS — J449 Chronic obstructive pulmonary disease, unspecified: Secondary | ICD-10-CM | POA: Diagnosis not present

## 2015-10-18 DIAGNOSIS — Z95810 Presence of automatic (implantable) cardiac defibrillator: Secondary | ICD-10-CM | POA: Diagnosis not present

## 2015-10-18 DIAGNOSIS — I509 Heart failure, unspecified: Secondary | ICD-10-CM | POA: Diagnosis not present

## 2015-10-18 DIAGNOSIS — Z125 Encounter for screening for malignant neoplasm of prostate: Secondary | ICD-10-CM | POA: Diagnosis not present

## 2015-10-24 DIAGNOSIS — M79672 Pain in left foot: Secondary | ICD-10-CM | POA: Diagnosis not present

## 2015-10-24 DIAGNOSIS — L03032 Cellulitis of left toe: Secondary | ICD-10-CM | POA: Diagnosis not present

## 2015-10-31 DIAGNOSIS — D631 Anemia in chronic kidney disease: Secondary | ICD-10-CM | POA: Diagnosis not present

## 2015-10-31 DIAGNOSIS — N2581 Secondary hyperparathyroidism of renal origin: Secondary | ICD-10-CM | POA: Diagnosis not present

## 2015-10-31 DIAGNOSIS — N184 Chronic kidney disease, stage 4 (severe): Secondary | ICD-10-CM | POA: Diagnosis not present

## 2015-10-31 DIAGNOSIS — I129 Hypertensive chronic kidney disease with stage 1 through stage 4 chronic kidney disease, or unspecified chronic kidney disease: Secondary | ICD-10-CM | POA: Diagnosis not present

## 2015-11-28 DIAGNOSIS — T8189XD Other complications of procedures, not elsewhere classified, subsequent encounter: Secondary | ICD-10-CM | POA: Diagnosis not present

## 2015-11-29 LAB — CUP PACEART INCLINIC DEVICE CHECK
Battery Remaining Longevity: 66
Brady Statistic RV Percent Paced: 28 %
Date Time Interrogation Session: 20170329143514
HighPow Impedance: 53.8996
Implantable Lead Implant Date: 20140206
Implantable Lead Location: 753860
Lead Channel Impedance Value: 425 Ohm
Lead Channel Pacing Threshold Amplitude: 3.75 V
Lead Channel Pacing Threshold Pulse Width: 1 ms
Lead Channel Pacing Threshold Pulse Width: 1 ms
Lead Channel Sensing Intrinsic Amplitude: 12 mV
Lead Channel Setting Pacing Amplitude: 5 V
Lead Channel Setting Sensing Sensitivity: 0.5 mV
MDC IDC LEAD IMPLANT DT: 20140206
MDC IDC LEAD LOCATION: 753859
MDC IDC MSMT LEADCHNL RA IMPEDANCE VALUE: 450 Ohm
MDC IDC MSMT LEADCHNL RA PACING THRESHOLD AMPLITUDE: 3.75 V
MDC IDC MSMT LEADCHNL RA SENSING INTR AMPL: 2.6 mV
MDC IDC MSMT LEADCHNL RV PACING THRESHOLD AMPLITUDE: 0.75 V
MDC IDC MSMT LEADCHNL RV PACING THRESHOLD AMPLITUDE: 0.75 V
MDC IDC MSMT LEADCHNL RV PACING THRESHOLD PULSEWIDTH: 0.5 ms
MDC IDC MSMT LEADCHNL RV PACING THRESHOLD PULSEWIDTH: 0.5 ms
MDC IDC SET LEADCHNL RV PACING AMPLITUDE: 1 V
MDC IDC SET LEADCHNL RV PACING PULSEWIDTH: 0.5 ms
MDC IDC STAT BRADY RA PERCENT PACED: 4.1 %
Pulse Gen Serial Number: 7072725

## 2015-12-12 ENCOUNTER — Telehealth: Payer: Self-pay | Admitting: Cardiology

## 2015-12-12 NOTE — Telephone Encounter (Signed)
Received records from [facility] [place] for appointment on 01/12/16 with Dr Stanford Breed.  Records given to [facility] (medical records) for Dr Jacalyn Lefevre schedule on 01/12/16. lp

## 2015-12-20 ENCOUNTER — Encounter: Payer: Medicare Other | Admitting: *Deleted

## 2015-12-20 ENCOUNTER — Telehealth: Payer: Self-pay | Admitting: Cardiology

## 2015-12-20 NOTE — Telephone Encounter (Signed)
LMOVM reminding pt to send remote transmission.   

## 2015-12-22 ENCOUNTER — Encounter: Payer: Self-pay | Admitting: Cardiology

## 2015-12-28 ENCOUNTER — Encounter (HOSPITAL_COMMUNITY): Payer: Self-pay

## 2015-12-28 ENCOUNTER — Emergency Department (HOSPITAL_COMMUNITY): Payer: Medicare Other

## 2015-12-28 ENCOUNTER — Emergency Department (HOSPITAL_COMMUNITY)
Admission: EM | Admit: 2015-12-28 | Discharge: 2015-12-28 | Disposition: A | Discharge status: Home / Self Care | Payer: Medicare Other | Attending: Emergency Medicine | Admitting: Emergency Medicine

## 2015-12-28 DIAGNOSIS — Z96619 Presence of unspecified artificial shoulder joint: Secondary | ICD-10-CM | POA: Insufficient documentation

## 2015-12-28 DIAGNOSIS — I509 Heart failure, unspecified: Secondary | ICD-10-CM | POA: Insufficient documentation

## 2015-12-28 DIAGNOSIS — R55 Syncope and collapse: Secondary | ICD-10-CM | POA: Diagnosis not present

## 2015-12-28 DIAGNOSIS — Z87891 Personal history of nicotine dependence: Secondary | ICD-10-CM | POA: Diagnosis not present

## 2015-12-28 DIAGNOSIS — I110 Hypertensive heart disease with heart failure: Secondary | ICD-10-CM | POA: Diagnosis not present

## 2015-12-28 DIAGNOSIS — Z950 Presence of cardiac pacemaker: Secondary | ICD-10-CM | POA: Insufficient documentation

## 2015-12-28 DIAGNOSIS — I2510 Atherosclerotic heart disease of native coronary artery without angina pectoris: Secondary | ICD-10-CM | POA: Diagnosis not present

## 2015-12-28 DIAGNOSIS — Z7901 Long term (current) use of anticoagulants: Secondary | ICD-10-CM | POA: Insufficient documentation

## 2015-12-28 DIAGNOSIS — Z79899 Other long term (current) drug therapy: Secondary | ICD-10-CM | POA: Insufficient documentation

## 2015-12-28 DIAGNOSIS — R42 Dizziness and giddiness: Secondary | ICD-10-CM | POA: Diagnosis not present

## 2015-12-28 DIAGNOSIS — M109 Gout, unspecified: Secondary | ICD-10-CM

## 2015-12-28 DIAGNOSIS — M10072 Idiopathic gout, left ankle and foot: Secondary | ICD-10-CM | POA: Diagnosis not present

## 2015-12-28 DIAGNOSIS — R531 Weakness: Secondary | ICD-10-CM | POA: Diagnosis not present

## 2015-12-28 DIAGNOSIS — R404 Transient alteration of awareness: Secondary | ICD-10-CM | POA: Diagnosis not present

## 2015-12-28 LAB — BASIC METABOLIC PANEL
ANION GAP: 9 (ref 5–15)
BUN: 28 mg/dL — ABNORMAL HIGH (ref 6–20)
CALCIUM: 10.8 mg/dL — AB (ref 8.9–10.3)
CHLORIDE: 103 mmol/L (ref 101–111)
CO2: 31 mmol/L (ref 22–32)
CREATININE: 2.44 mg/dL — AB (ref 0.61–1.24)
GFR, EST AFRICAN AMERICAN: 28 mL/min — AB (ref >60)
GFR, EST NON AFRICAN AMERICAN: 24 mL/min — AB (ref >60)
Glucose, Bld: 97 mg/dL (ref 65–99)
POTASSIUM: 3.2 mmol/L — AB (ref 3.5–5.1)
SODIUM: 143 mmol/L (ref 135–145)

## 2015-12-28 LAB — CBC WITH DIFFERENTIAL/PLATELET
BASOS PCT: 0 %
Basophils Absolute: 0 10*3/uL (ref 0.0–0.1)
EOS ABS: 0.2 10*3/uL (ref 0.0–0.7)
Eosinophils Relative: 1 %
HCT: 44.6 % (ref 39.0–52.0)
Hemoglobin: 14.2 g/dL (ref 13.0–17.0)
Lymphocytes Relative: 16 %
Lymphs Abs: 2.2 10*3/uL (ref 0.7–4.0)
MCH: 27.7 pg (ref 26.0–34.0)
MCHC: 31.8 g/dL (ref 30.0–36.0)
MCV: 86.9 fL (ref 78.0–100.0)
MONO ABS: 1.2 10*3/uL — AB (ref 0.1–1.0)
MONOS PCT: 9 %
Neutro Abs: 10.3 10*3/uL — ABNORMAL HIGH (ref 1.7–7.7)
Neutrophils Relative %: 74 %
PLATELETS: 206 10*3/uL (ref 150–400)
RBC: 5.13 MIL/uL (ref 4.22–5.81)
RDW: 16.5 % — AB (ref 11.5–15.5)
WBC: 13.8 10*3/uL — ABNORMAL HIGH (ref 4.0–10.5)

## 2015-12-28 MED ORDER — HYDROCODONE-ACETAMINOPHEN 5-325 MG PO TABS: 1.0000 | ORAL_TABLET | Freq: Four times a day (QID) | ORAL | Status: DC | PRN

## 2015-12-28 MED ORDER — PREDNISONE 10 MG PO TABS: 20.0000 mg | ORAL_TABLET | Freq: Two times a day (BID) | ORAL | Status: DC

## 2015-12-28 NOTE — Discharge Instructions (Signed)
Prednisone as prescribed.  Hydrocodone as prescribed as needed for pain.  Continue your other medications as before, and return to the ER if your symptoms significantly worsen or change.   Gout Gout is an inflammatory arthritis caused by a buildup of uric acid crystals in the joints. Uric acid is a chemical that is normally present in the blood. When the level of uric acid in the blood is too high it can form crystals that deposit in your joints and tissues. This causes joint redness, soreness, and swelling (inflammation). Repeat attacks are common. Over time, uric acid crystals can form into masses (tophi) near a joint, destroying bone and causing disfigurement. Gout is treatable and often preventable. CAUSES  The disease begins with elevated levels of uric acid in the blood. Uric acid is produced by your body when it breaks down a naturally found substance called purines. Certain foods you eat, such as meats and fish, contain high amounts of purines. Causes of an elevated uric acid level include:  Being passed down from parent to child (heredity).  Diseases that cause increased uric acid production (such as obesity, psoriasis, and certain cancers).  Excessive alcohol use.  Diet, especially diets rich in meat and seafood.  Medicines, including certain cancer-fighting medicines (chemotherapy), water pills (diuretics), and aspirin.  Chronic kidney disease. The kidneys are no longer able to remove uric acid well.  Problems with metabolism. Conditions strongly associated with gout include:  Obesity.  High blood pressure.  High cholesterol.  Diabetes. Not everyone with elevated uric acid levels gets gout. It is not understood why some people get gout and others do not. Surgery, joint injury, and eating too much of certain foods are some of the factors that can lead to gout attacks. SYMPTOMS   An attack of gout comes on quickly. It causes intense pain with redness, swelling, and  warmth in a joint.  Fever can occur.  Often, only one joint is involved. Certain joints are more commonly involved:  Base of the big toe.  Knee.  Ankle.  Wrist.  Finger. Without treatment, an attack usually goes away in a few days to weeks. Between attacks, you usually will not have symptoms, which is different from many other forms of arthritis. DIAGNOSIS  Your caregiver will suspect gout based on your symptoms and exam. In some cases, tests may be recommended. The tests may include:  Blood tests.  Urine tests.  X-rays.  Joint fluid exam. This exam requires a needle to remove fluid from the joint (arthrocentesis). Using a microscope, gout is confirmed when uric acid crystals are seen in the joint fluid. TREATMENT  There are two phases to gout treatment: treating the sudden onset (acute) attack and preventing attacks (prophylaxis).  Treatment of an Acute Attack.  Medicines are used. These include anti-inflammatory medicines or steroid medicines.  An injection of steroid medicine into the affected joint is sometimes necessary.  The painful joint is rested. Movement can worsen the arthritis.  You may use warm or cold treatments on painful joints, depending which works best for you.  Treatment to Prevent Attacks.  If you suffer from frequent gout attacks, your caregiver may advise preventive medicine. These medicines are started after the acute attack subsides. These medicines either help your kidneys eliminate uric acid from your body or decrease your uric acid production. You may need to stay on these medicines for a very long time.  The early phase of treatment with preventive medicine can be associated with an increase  in acute gout attacks. For this reason, during the first few months of treatment, your caregiver may also advise you to take medicines usually used for acute gout treatment. Be sure you understand your caregiver's directions. Your caregiver may make several  adjustments to your medicine dose before these medicines are effective.  Discuss dietary treatment with your caregiver or dietitian. Alcohol and drinks high in sugar and fructose and foods such as meat, poultry, and seafood can increase uric acid levels. Your caregiver or dietitian can advise you on drinks and foods that should be limited. HOME CARE INSTRUCTIONS   Do not take aspirin to relieve pain. This raises uric acid levels.  Only take over-the-counter or prescription medicines for pain, discomfort, or fever as directed by your caregiver.  Rest the joint as much as possible. When in bed, keep sheets and blankets off painful areas.  Keep the affected joint raised (elevated).  Apply warm or cold treatments to painful joints. Use of warm or cold treatments depends on which works best for you.  Use crutches if the painful joint is in your leg.  Drink enough fluids to keep your urine clear or pale yellow. This helps your body get rid of uric acid. Limit alcohol, sugary drinks, and fructose drinks.  Follow your dietary instructions. Pay careful attention to the amount of protein you eat. Your daily diet should emphasize fruits, vegetables, whole grains, and fat-free or low-fat milk products. Discuss the use of coffee, vitamin C, and cherries with your caregiver or dietitian. These may be helpful in lowering uric acid levels.  Maintain a healthy body weight. SEEK MEDICAL CARE IF:   You develop diarrhea, vomiting, or any side effects from medicines.  You do not feel better in 24 hours, or you are getting worse. SEEK IMMEDIATE MEDICAL CARE IF:   Your joint becomes suddenly more tender, and you have chills or a fever. MAKE SURE YOU:   Understand these instructions.  Will watch your condition.  Will get help right away if you are not doing well or get worse.   This information is not intended to replace advice given to you by your health care provider. Make sure you discuss any  questions you have with your health care provider.   Document Released: 06/07/2000 Document Revised: 07/01/2014 Document Reviewed: 01/22/2012 Elsevier Interactive Patient Education 2016 [employer].  Near-Syncope Near-syncope (commonly known as near fainting) is sudden weakness, dizziness, or feeling like you might pass out. During an episode of near-syncope, you may also develop pale skin, have tunnel vision, or feel sick to your stomach (nauseous). Near-syncope may occur when getting up after sitting or while standing for a long time. It is caused by a sudden decrease in blood flow to the brain. This decrease can result from various causes or triggers, most of which are not serious. However, because near-syncope can sometimes be a sign of something serious, a medical evaluation is required. The specific cause is often not determined. HOME CARE INSTRUCTIONS  Monitor your condition for any changes. The following actions may help to alleviate any discomfort you are experiencing:  Have someone stay with you until you feel stable.  Lie down right away and prop your feet up if you start feeling like you might faint. Breathe deeply and steadily. Wait until all the symptoms have passed. Most of these episodes last only a few minutes. You may feel tired for several hours.   Drink enough fluids to keep your urine clear or pale yellow.  If you are taking blood pressure or heart medicine, get up slowly when seated or lying down. Take several minutes to sit and then stand. This can reduce dizziness.  Follow up with your health care provider as directed. SEEK IMMEDIATE MEDICAL CARE IF:   You have a severe headache.   You have unusual pain in the chest, abdomen, or back.   You are bleeding from the mouth or rectum, or you have black or tarry stool.   You have an irregular or very fast heartbeat.   You have repeated fainting or have seizure-like jerking during an episode.   You faint when  sitting or lying down.   You have confusion.   You have difficulty walking.   You have severe weakness.   You have vision problems.  MAKE SURE YOU:   Understand these instructions.  Will watch your condition.  Will get help right away if you are not doing well or get worse.   This information is not intended to replace advice given to you by your health care provider. Make sure you discuss any questions you have with your health care provider.   Document Released: 06/10/2005 Document Revised: 06/15/2013 Document Reviewed: 11/13/2012 Elsevier Interactive Patient Education [employer].

## 2015-12-28 NOTE — ED Notes (Signed)
Per [facility], pt from home. Was looking in the mirror this morning and became dizzy and sat down. Per EMS, he is c/o pain to left hip, knee, and foot d/t gout. Has his left great toenail removed about a month ago.

## 2015-12-28 NOTE — ED Provider Notes (Signed)
CSN: [id number]     Arrival date & time 12/28/15  1010 History   First MD Initiated Contact with Patient 12/28/15 1016     Chief Complaint  Patient presents with  . Near Syncope  . Foot Pain     (Consider location/radiation/quality/duration/timing/severity/associated sxs/prior Treatment) HPI Comments: Patient is a 76 year old male with past but no history of atrial fibrillation, hypertension, coronary artery disease. He also has a history of ischemic cardiomyopathy with defibrillator placement. He reports that today while getting ready for a rheumatology appointment, he became acutely dizzy and near syncopal. He reports this as a spinning type sensation which caused him to lower himself to the ground. He did not lose consciousness. This lasted for several minutes, then resolved. He denies having been shocked by his device. He denies any headache, chest pain, or difficulty breathing.  Patient is a 75 y.o. male presenting with near-syncope and lower extremity pain. The history is provided by the patient.  Near Syncope This is a new problem. The current episode started 1 to 2 hours ago. The problem occurs constantly. The problem has been resolved. Pertinent negatives include no chest pain and no headaches. Nothing aggravates the symptoms. Nothing relieves the symptoms. He has tried nothing for the symptoms.  Foot Pain Pertinent negatives include no chest pain and no headaches.    Past Medical History  Diagnosis Date  . Atrial fibrillation ([place])   . Hypertension   . Hypercholesteremia   . Renal insufficiency   . Coronary artery disease     Prior stent  . CHF (congestive heart failure) ([place])   . Degenerative joint disease   . Gout   . Ischemic cardiomyopathy   . 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 Cardiac surgery    . Pacemaker placement    . Back surgery    . Total shoulder replacement    . Wrist  surgery     Family History  Problem Relation Age of Onset  . Heart disease      No family history  . Diabetes Sister    Social History  Substance Use Topics  . Smoking status: Former Research scientist (life sciences)  . Smokeless tobacco: Never Used  . Alcohol Use: 0.0 oz/week    0 Standard drinks or equivalent per week     Comment: Occasional    Review of Systems  Cardiovascular: Positive for near-syncope. Negative for chest pain.  Neurological: Negative for headaches.  All other systems reviewed and are negative.     Allergies  Hymenoptera venom preparations; Lisinopril; Piroxicam; and Amoxicillin-pot clavulanate  Home Medications   Prior to Admission medications   Medication Sig Start Date End Date Taking? Authorizing Provider  albuterol (PROVENTIL HFA;VENTOLIN HFA) 108 (90 Base) MCG/ACT inhaler Inhale 1 puff into the lungs every 6 (six) hours as needed for wheezing or shortness of breath.    Historical Provider, MD  allopurinol (ZYLOPRIM) 300 MG tablet Take 150 mg by mouth daily.    Historical Provider, MD  apixaban (ELIQUIS) 5 MG TABS tablet Take 1 tablet (5 mg total) by mouth 2 (two) times daily. 09/12/15   Lelon Perla, MD  atorvastatin (LIPITOR) 80 MG tablet Take 1 tablet (80 mg total) by mouth daily. 09/12/15   Lelon Perla, MD  calcitRIOL (ROCALTROL) 0.25 MCG capsule Take 0.25 mcg by mouth daily.    Historical Provider, MD  carvedilol (COREG) 12.5 MG tablet Take 25 mg by mouth daily.  Historical Provider, MD  diclofenac sodium (VOLTAREN) 1 % GEL Apply 2 g topically 4 (four) times daily.    Historical Provider, MD  Fluticasone-Salmeterol (ADVAIR) 250-50 MCG/DOSE AEPB Inhale 1 puff into the lungs 2 (two) times daily.    Historical Provider, MD  furosemide (LASIX) 40 MG tablet Take 1 tablet (40 mg total) by mouth 2 (two) times daily. 07/09/15   Norman Herrlich, MD  hydrALAZINE (APRESOLINE) 25 MG tablet Take 1 tablet (25 mg total) by mouth 3 (three) times daily. 09/12/15   Lelon Perla,  MD  HYDROcodone-acetaminophen (NORCO/VICODIN) 5-325 MG tablet Take 1 tablet by mouth every 6 (six) hours as needed. 10/13/15   Stevi Barrett, PA-C  isosorbide mononitrate (IMDUR) 30 MG 24 hr tablet Take 1 tablet (30 mg total) by mouth daily. 09/12/15   Lelon Perla, MD  predniSONE (DELTASONE) 50 MG tablet Take one tablet once a day for 3 days 10/13/15   Stevi Barrett, PA-C  tiotropium (SPIRIVA) 18 MCG inhalation capsule Place 18 mcg into inhaler and inhale daily.    Historical Provider, MD   BP 148/79 mmHg  Pulse 68  Temp(Src) 98 F (36.7 C) (Oral)  Resp 16  Ht 5\' 8"  (1.727 m)  Wt 185 lb (83.915 kg)  BMI 28.14 kg/m2  SpO2 99% Physical Exam  Constitutional: He is oriented to person, place, and time. He appears well-developed and well-nourished. No distress.  HENT:  Head: Normocephalic and atraumatic.  Mouth/Throat: Oropharynx is clear and moist.  Eyes: EOM are normal. Pupils are equal, round, and reactive to light.  Neck: Normal range of motion. Neck supple.  Cardiovascular: Normal rate and regular rhythm.  Exam reveals no friction rub.   No murmur heard. Pulmonary/Chest: Effort normal and breath sounds normal. No respiratory distress. He has no wheezes. He has no rales.  Abdominal: Soft. Bowel sounds are normal. He exhibits no distension. There is no tenderness.  Musculoskeletal: Normal range of motion. He exhibits no edema.  Neurological: He is alert and oriented to person, place, and time. No cranial nerve deficit. He exhibits normal muscle tone. Coordination normal.  Skin: Skin is warm and dry. He is not diaphoretic.  Nursing note and vitals reviewed.   ED Course  Procedures (including critical care time) Labs Review Labs Reviewed  BASIC METABOLIC PANEL  CBC WITH DIFFERENTIAL/PLATELET    Imaging Review No results found. I have personally reviewed and evaluated these images and lab results as part of my medical decision-making.   EKG Interpretation   Date/Time:   Thursday December 28 2015 10:25:14 EDT Ventricular Rate:  70 PR Interval:    QRS Duration: 90 QT Interval:  397 QTC Calculation: 429 R Axis:   -42 Text Interpretation:  Sinus rhythm Prolonged PR interval Left axis  deviation Borderline repolarization abnormality Confirmed by Sharma Lawrance  MD,  Samad Thon ([id number]) on 12/28/2015 10:44:04 AM      MDM   Final diagnoses:  None    Patient presents after a near-syncopal/dizzy episode. I am uncertain as to the exact etiology of this, however he is now neurologically intact and hemodynamically stable. His workup reveals no significant abnormality. Head CT is negative, EKG is unchanged, and laboratory studies are consistent with baseline.  I've spoken with Dr. Marlou Porch from cardiology who does not feel as though any emergent cardiology intervention is indicated. The patient does have a defibrillator however did not receive a shock. He will be discharged with close follow-up and when necessary return.  He is complaining of  some pain in his left leg, especially his ankle. He suspects he may be having a flareup of gout. His exam is consistent with this. He has no fever and no white count that would suggest a septic arthritis. He will be treated with prednisone, pain medication.    Veryl Speak, MD 12/28/15 1259

## 2015-12-28 NOTE — ED Notes (Signed)
Pt returned from CT scan.

## 2015-12-28 NOTE — ED Notes (Signed)
Pt is in stable condition upon d/c and is escorted from ED via wheelchair. 

## 2015-12-28 NOTE — ED Notes (Signed)
Pt taken to CT scan.

## 2016-01-02 DIAGNOSIS — T8189XS Other complications of procedures, not elsewhere classified, sequela: Secondary | ICD-10-CM | POA: Diagnosis not present

## 2016-01-04 DIAGNOSIS — I129 Hypertensive chronic kidney disease with stage 1 through stage 4 chronic kidney disease, or unspecified chronic kidney disease: Secondary | ICD-10-CM | POA: Diagnosis not present

## 2016-01-04 DIAGNOSIS — D631 Anemia in chronic kidney disease: Secondary | ICD-10-CM | POA: Diagnosis not present

## 2016-01-04 DIAGNOSIS — N2581 Secondary hyperparathyroidism of renal origin: Secondary | ICD-10-CM | POA: Diagnosis not present

## 2016-01-04 DIAGNOSIS — N184 Chronic kidney disease, stage 4 (severe): Secondary | ICD-10-CM | POA: Diagnosis not present

## 2016-01-05 ENCOUNTER — Telehealth: Payer: Self-pay | Admitting: Cardiology

## 2016-01-05 DIAGNOSIS — N184 Chronic kidney disease, stage 4 (severe): Secondary | ICD-10-CM | POA: Diagnosis not present

## 2016-01-05 DIAGNOSIS — M150 Primary generalized (osteo)arthritis: Secondary | ICD-10-CM | POA: Diagnosis not present

## 2016-01-05 DIAGNOSIS — L600 Ingrowing nail: Secondary | ICD-10-CM | POA: Diagnosis not present

## 2016-01-05 DIAGNOSIS — I5022 Chronic systolic (congestive) heart failure: Secondary | ICD-10-CM | POA: Diagnosis not present

## 2016-01-05 DIAGNOSIS — M109 Gout, unspecified: Secondary | ICD-10-CM | POA: Diagnosis not present

## 2016-01-05 DIAGNOSIS — M2550 Pain in unspecified joint: Secondary | ICD-10-CM | POA: Diagnosis not present

## 2016-01-05 DIAGNOSIS — M5136 Other intervertebral disc degeneration, lumbar region: Secondary | ICD-10-CM | POA: Diagnosis not present

## 2016-01-05 DIAGNOSIS — R768 Other specified abnormal immunological findings in serum: Secondary | ICD-10-CM | POA: Diagnosis not present

## 2016-01-05 DIAGNOSIS — R5383 Other fatigue: Secondary | ICD-10-CM | POA: Diagnosis not present

## 2016-01-05 NOTE — Telephone Encounter (Signed)
Received records from [facility] for appointment on 01/12/16 with Dr Stanford Breed.  Records given to [facility] (medical records) for Dr Jacalyn Lefevre schedule on 01/12/16. lp

## 2016-01-10 NOTE — Progress Notes (Signed)
HPI: FU CAD and congestive heart failure. Previously cared for in [facility] Patient was admitted in January 2017 with congestive heart failure. It was noted he had a history of atrial fibrillation, coronary artery disease, ischemic cardiomyopathy with ejection fraction 35-40%. Laboratories shows he has a creatinine of 3.30. Echocardiogram April 2017 showed ejection fraction 123456, grade 2 diastolic dysfunction, moderate left atrial enlargement and mild tricuspid regurgitation. Seen in the emergency room July 2017 with near syncope. Head CT negative. Since last seen, Patient denies dyspnea, chest pain, palpitations or syncope. He did have some lightheadedness with standing. This has resolved.  Current Outpatient Prescriptions  Medication Sig Dispense Refill  . albuterol (PROVENTIL HFA;VENTOLIN HFA) 108 (90 Base) MCG/ACT inhaler Inhale 1 puff into the lungs every 6 (six) hours as needed for wheezing or shortness of breath.    . allopurinol (ZYLOPRIM) 300 MG tablet Take 150 mg by mouth daily.    Marland Kitchen apixaban (ELIQUIS) 5 MG TABS tablet Take 1 tablet (5 mg total) by mouth 2 (two) times daily. 60 tablet 6  . atorvastatin (LIPITOR) 80 MG tablet Take 1 tablet (80 mg total) by mouth daily. 30 tablet 6  . carvedilol (COREG) 12.5 MG tablet Take 25 mg by mouth daily.     . diclofenac sodium (VOLTAREN) 1 % GEL Apply 2 g topically 4 (four) times daily.    . Fluticasone-Salmeterol (ADVAIR) 250-50 MCG/DOSE AEPB Inhale 1 puff into the lungs 2 (two) times daily.    . furosemide (LASIX) 40 MG tablet Take 1 tablet (40 mg total) by mouth 2 (two) times daily. 60 tablet 3  . hydrALAZINE (APRESOLINE) 25 MG tablet Take 1 tablet (25 mg total) by mouth 3 (three) times daily. 270 tablet 3  . HYDROcodone-acetaminophen (NORCO) 5-325 MG tablet Take 1-2 tablets by mouth every 6 (six) hours as needed. 20 tablet 0  . isosorbide mononitrate (IMDUR) 30 MG 24 hr tablet Take 1 tablet (30 mg total) by mouth daily.  90 tablet 3  . predniSONE (DELTASONE) 10 MG tablet Take 2 tablets (20 mg total) by mouth 2 (two) times daily. 20 tablet 0  . tiotropium (SPIRIVA) 18 MCG inhalation capsule Place 18 mcg into inhaler and inhale daily.     No current facility-administered medications for this visit.     Past Medical History  Diagnosis Date  . Atrial fibrillation ([place])   . Hypertension   . Hypercholesteremia   . Renal insufficiency   . Coronary artery disease     Prior stent  . CHF (congestive heart failure) ([place])   . Degenerative joint disease   . Gout   . Ischemic cardiomyopathy   . 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 Cardiac surgery    . Pacemaker placement    . Back surgery    . Total shoulder replacement    . Wrist surgery      Social History   Social History  . Marital Status: Divorced    Spouse Name: N/A  . Number of Children: 4  . Years of Education: N/A   Occupational History  . Not on file.   Social History Main Topics  . Smoking status: Current Some Day Smoker    Types: Cigars  . Smokeless tobacco: Never Used  . Alcohol Use: 0.0 oz/week    0 Standard drinks or equivalent per week     Comment: Occasional  . Drug Use: Not  on file  . Sexual Activity: Not on file   Other Topics Concern  . Not on file   Social History Narrative    Family History  Problem Relation Age of Onset  . Heart disease      No family history  . Diabetes Sister     ROS: no fevers or chills, productive cough, hemoptysis, dysphasia, odynophagia, melena, hematochezia, dysuria, hematuria, rash, seizure activity, orthopnea, PND, pedal edema, claudication. Remaining systems are negative.  Physical Exam: Well-developed well-nourished in no acute distress.  Skin is warm and dry.  HEENT is normal.  Neck is supple.  Chest is clear to auscultation with normal expansion.  Cardiovascular exam is regular rate and  rhythm.  Abdominal exam nontender or distended. No masses palpated. Extremities show no edema. neuro grossly intact  ECG 12/28/2015-sinus rhythm, first-degree AV block, left anterior fascicular block, nonspecific ST changes.  A/P  1 Bruit-schedule abdominal ultrasound to exclude aneurysm.  2 paroxysmal atrial fibrillation-continue apixaban. Continue carvedilol.  3 Ischemic cardiomyopathy-continue beta blocker. Continue hydralazine and nitrates. No ACE inhibitor or ARB given severity of renal insufficiency. Continue present dose of lasix. Patient is euvolemic on examination.  4 coronary artery disease-continue statin. No aspirin given need for anticoagulation. We will again try to obtain records from [place].  5 ICD-followed by electrophysiology.  6 hyperlipidemia-continue statin. Check lipids and liver.  7 renal insufficiency-monitored by nephrology.  8 Hypertension-blood pressure is mildly elevated today but he follows this at home and it is typically systolic in the 0000000. Continue present medications.  Kirk Ruths, MD

## 2016-01-12 ENCOUNTER — Encounter: Payer: Self-pay | Admitting: Cardiology

## 2016-01-12 ENCOUNTER — Encounter: Payer: Self-pay | Admitting: *Deleted

## 2016-01-12 ENCOUNTER — Ambulatory Visit (INDEPENDENT_AMBULATORY_CARE_PROVIDER_SITE_OTHER): Payer: Medicare Other | Admitting: Cardiology

## 2016-01-12 VITALS — BP 148/82 | HR 72 | Ht 68.0 in | Wt 180.8 lb

## 2016-01-12 DIAGNOSIS — E785 Hyperlipidemia, unspecified: Secondary | ICD-10-CM

## 2016-01-12 DIAGNOSIS — R0989 Other specified symptoms and signs involving the circulatory and respiratory systems: Secondary | ICD-10-CM | POA: Diagnosis not present

## 2016-01-12 DIAGNOSIS — I10 Essential (primary) hypertension: Secondary | ICD-10-CM | POA: Diagnosis not present

## 2016-01-12 DIAGNOSIS — I2510 Atherosclerotic heart disease of native coronary artery without angina pectoris: Secondary | ICD-10-CM | POA: Diagnosis not present

## 2016-01-12 DIAGNOSIS — Z87891 Personal history of nicotine dependence: Secondary | ICD-10-CM

## 2016-01-12 DIAGNOSIS — N189 Chronic kidney disease, unspecified: Secondary | ICD-10-CM

## 2016-01-12 DIAGNOSIS — I480 Paroxysmal atrial fibrillation: Secondary | ICD-10-CM

## 2016-01-12 DIAGNOSIS — I5022 Chronic systolic (congestive) heart failure: Secondary | ICD-10-CM

## 2016-01-12 LAB — HEPATIC FUNCTION PANEL
ALK PHOS: 62 U/L (ref 40–115)
ALT: 19 U/L (ref 9–46)
AST: 15 U/L (ref 10–35)
Albumin: 3.5 g/dL — ABNORMAL LOW (ref 3.6–5.1)
BILIRUBIN DIRECT: 0.1 mg/dL (ref <=0.2)
BILIRUBIN TOTAL: 0.5 mg/dL (ref 0.2–1.2)
Indirect Bilirubin: 0.4 mg/dL (ref 0.2–1.2)
Total Protein: 6.4 g/dL (ref 6.1–8.1)

## 2016-01-12 LAB — LIPID PANEL
CHOL/HDL RATIO: 2.1 ratio (ref <=5.0)
CHOLESTEROL: 131 mg/dL (ref 125–200)
HDL: 61 mg/dL (ref >=40)
LDL Cholesterol: 57 mg/dL (ref <130)
Triglycerides: 66 mg/dL (ref <150)
VLDL: 13 mg/dL (ref <30)

## 2016-01-12 NOTE — Patient Instructions (Signed)
Medication Instructions:   NO CHANGE  Labwork-  Your physician recommends that you HAVE LAB WORK TODAY  Testing/Procedures:  Your physician has requested that you have an abdominal aorta duplex. During this test, an ultrasound is used to evaluate the aorta. Allow 30 minutes for this exam. Do not eat after midnight the day before and avoid carbonated beverages   Follow-Up:  Your physician wants you to follow-up in: [place] will receive a reminder letter in the mail two months in advance. If you don't receive a letter, please call our office to schedule the follow-up appointment.   If you need a refill on your cardiac medications before your next appointment, please call your pharmacy.

## 2016-01-15 ENCOUNTER — Telehealth: Payer: Self-pay | Admitting: Cardiology

## 2016-01-15 NOTE — Telephone Encounter (Signed)
Faxed Records Release, signed by patient, to [facility] to obtain records per Dr Stanford Breed.  Faxed on 01/15/16. lp

## 2016-01-15 NOTE — Telephone Encounter (Signed)
Received records from [place] as requested by Dr Stanford Breed.  Records given to Dr Stanford Breed for review. lp

## 2016-01-22 ENCOUNTER — Ambulatory Visit (HOSPITAL_COMMUNITY)
Admission: RE | Admit: 2016-01-22 | Discharge: 2016-01-22 | Disposition: A | Discharge status: Home / Self Care | Payer: Medicare Other | Source: Ambulatory Visit | Attending: Internal Medicine | Admitting: Internal Medicine

## 2016-01-22 DIAGNOSIS — I509 Heart failure, unspecified: Secondary | ICD-10-CM | POA: Diagnosis not present

## 2016-01-22 DIAGNOSIS — I255 Ischemic cardiomyopathy: Secondary | ICD-10-CM | POA: Diagnosis not present

## 2016-01-22 DIAGNOSIS — I700 Atherosclerosis of aorta: Secondary | ICD-10-CM | POA: Diagnosis not present

## 2016-01-22 DIAGNOSIS — Z87891 Personal history of nicotine dependence: Secondary | ICD-10-CM

## 2016-01-22 DIAGNOSIS — I2510 Atherosclerotic heart disease of native coronary artery without angina pectoris: Secondary | ICD-10-CM | POA: Insufficient documentation

## 2016-01-22 DIAGNOSIS — R0989 Other specified symptoms and signs involving the circulatory and respiratory systems: Secondary | ICD-10-CM | POA: Insufficient documentation

## 2016-01-22 DIAGNOSIS — I110 Hypertensive heart disease with heart failure: Secondary | ICD-10-CM | POA: Diagnosis not present

## 2016-01-22 DIAGNOSIS — I708 Atherosclerosis of other arteries: Secondary | ICD-10-CM | POA: Diagnosis not present

## 2016-01-22 DIAGNOSIS — Z136 Encounter for screening for cardiovascular disorders: Secondary | ICD-10-CM | POA: Diagnosis not present

## 2016-01-22 DIAGNOSIS — E7800 Pure hypercholesterolemia, unspecified: Secondary | ICD-10-CM | POA: Insufficient documentation

## 2016-01-22 DIAGNOSIS — I723 Aneurysm of iliac artery: Secondary | ICD-10-CM | POA: Diagnosis not present

## 2016-01-29 DIAGNOSIS — N184 Chronic kidney disease, stage 4 (severe): Secondary | ICD-10-CM | POA: Diagnosis not present

## 2016-02-02 ENCOUNTER — Other Ambulatory Visit: Payer: Self-pay | Admitting: *Deleted

## 2016-03-07 DIAGNOSIS — I129 Hypertensive chronic kidney disease with stage 1 through stage 4 chronic kidney disease, or unspecified chronic kidney disease: Secondary | ICD-10-CM | POA: Diagnosis not present

## 2016-03-07 DIAGNOSIS — D631 Anemia in chronic kidney disease: Secondary | ICD-10-CM | POA: Diagnosis not present

## 2016-03-07 DIAGNOSIS — N2581 Secondary hyperparathyroidism of renal origin: Secondary | ICD-10-CM | POA: Diagnosis not present

## 2016-03-07 DIAGNOSIS — N184 Chronic kidney disease, stage 4 (severe): Secondary | ICD-10-CM | POA: Diagnosis not present

## 2016-03-12 DIAGNOSIS — Z125 Encounter for screening for malignant neoplasm of prostate: Secondary | ICD-10-CM | POA: Diagnosis not present

## 2016-04-08 DIAGNOSIS — M2550 Pain in unspecified joint: Secondary | ICD-10-CM | POA: Diagnosis not present

## 2016-04-08 DIAGNOSIS — L600 Ingrowing nail: Secondary | ICD-10-CM | POA: Diagnosis not present

## 2016-04-08 DIAGNOSIS — N184 Chronic kidney disease, stage 4 (severe): Secondary | ICD-10-CM | POA: Diagnosis not present

## 2016-04-08 DIAGNOSIS — R5383 Other fatigue: Secondary | ICD-10-CM | POA: Diagnosis not present

## 2016-04-08 DIAGNOSIS — R768 Other specified abnormal immunological findings in serum: Secondary | ICD-10-CM | POA: Diagnosis not present

## 2016-04-08 DIAGNOSIS — M109 Gout, unspecified: Secondary | ICD-10-CM | POA: Diagnosis not present

## 2016-04-08 DIAGNOSIS — M25552 Pain in left hip: Secondary | ICD-10-CM | POA: Diagnosis not present

## 2016-04-08 DIAGNOSIS — M5136 Other intervertebral disc degeneration, lumbar region: Secondary | ICD-10-CM | POA: Diagnosis not present

## 2016-04-08 DIAGNOSIS — I5022 Chronic systolic (congestive) heart failure: Secondary | ICD-10-CM | POA: Diagnosis not present

## 2016-04-08 DIAGNOSIS — M150 Primary generalized (osteo)arthritis: Secondary | ICD-10-CM | POA: Diagnosis not present

## 2016-05-01 ENCOUNTER — Other Ambulatory Visit: Payer: Self-pay | Admitting: Nephrology

## 2016-05-01 ENCOUNTER — Ambulatory Visit
Admission: RE | Admit: 2016-05-01 | Discharge: 2016-05-01 | Disposition: A | Discharge status: Home / Self Care | Payer: Medicare Other | Source: Ambulatory Visit | Attending: Nephrology | Admitting: Nephrology

## 2016-05-01 DIAGNOSIS — C801 Malignant (primary) neoplasm, unspecified: Secondary | ICD-10-CM | POA: Diagnosis not present

## 2016-05-09 DIAGNOSIS — I4891 Unspecified atrial fibrillation: Secondary | ICD-10-CM | POA: Diagnosis not present

## 2016-05-09 DIAGNOSIS — Z95810 Presence of automatic (implantable) cardiac defibrillator: Secondary | ICD-10-CM | POA: Diagnosis not present

## 2016-05-09 DIAGNOSIS — I10 Essential (primary) hypertension: Secondary | ICD-10-CM | POA: Diagnosis not present

## 2016-05-09 DIAGNOSIS — N184 Chronic kidney disease, stage 4 (severe): Secondary | ICD-10-CM | POA: Diagnosis not present

## 2016-05-09 DIAGNOSIS — J449 Chronic obstructive pulmonary disease, unspecified: Secondary | ICD-10-CM | POA: Diagnosis not present

## 2016-05-09 DIAGNOSIS — E7800 Pure hypercholesterolemia, unspecified: Secondary | ICD-10-CM | POA: Diagnosis not present

## 2016-05-09 DIAGNOSIS — Z23 Encounter for immunization: Secondary | ICD-10-CM | POA: Diagnosis not present

## 2016-05-09 DIAGNOSIS — R739 Hyperglycemia, unspecified: Secondary | ICD-10-CM | POA: Diagnosis not present

## 2016-05-09 DIAGNOSIS — M109 Gout, unspecified: Secondary | ICD-10-CM | POA: Diagnosis not present

## 2016-05-09 DIAGNOSIS — I509 Heart failure, unspecified: Secondary | ICD-10-CM | POA: Diagnosis not present

## 2016-05-10 ENCOUNTER — Telehealth: Payer: Self-pay | Admitting: Cardiology

## 2016-05-10 NOTE — Telephone Encounter (Signed)
Spoke w/ pt and informed him that we never received his remote transmission that was due in 11/2015. Pt stated that he never hooked up the monitor. I am going to call him back on Monday to help him set up his monitor because pt is not home at this current time. Pt agreed with this plan.

## 2016-05-13 DIAGNOSIS — R972 Elevated prostate specific antigen [PSA]: Secondary | ICD-10-CM | POA: Diagnosis not present

## 2016-05-14 NOTE — Telephone Encounter (Signed)
Called pt back and he still is not near his monitor. He is going to call back when he his close to his monitor to get help setting it up.

## 2016-05-28 DIAGNOSIS — N2581 Secondary hyperparathyroidism of renal origin: Secondary | ICD-10-CM | POA: Diagnosis not present

## 2016-05-28 DIAGNOSIS — N184 Chronic kidney disease, stage 4 (severe): Secondary | ICD-10-CM | POA: Diagnosis not present

## 2016-05-28 DIAGNOSIS — D631 Anemia in chronic kidney disease: Secondary | ICD-10-CM | POA: Diagnosis not present

## 2016-05-28 DIAGNOSIS — I129 Hypertensive chronic kidney disease with stage 1 through stage 4 chronic kidney disease, or unspecified chronic kidney disease: Secondary | ICD-10-CM | POA: Diagnosis not present

## 2016-05-28 DIAGNOSIS — Z6827 Body mass index (BMI) 27.0-27.9, adult: Secondary | ICD-10-CM | POA: Diagnosis not present

## 2016-05-30 NOTE — Telephone Encounter (Signed)
3rd call  Spoke with pt he is going to call back today around 3 PM.

## 2016-07-09 DIAGNOSIS — M2550 Pain in unspecified joint: Secondary | ICD-10-CM | POA: Diagnosis not present

## 2016-07-09 DIAGNOSIS — M150 Primary generalized (osteo)arthritis: Secondary | ICD-10-CM | POA: Diagnosis not present

## 2016-07-09 DIAGNOSIS — I5022 Chronic systolic (congestive) heart failure: Secondary | ICD-10-CM | POA: Diagnosis not present

## 2016-07-09 DIAGNOSIS — N184 Chronic kidney disease, stage 4 (severe): Secondary | ICD-10-CM | POA: Diagnosis not present

## 2016-07-09 DIAGNOSIS — Z6828 Body mass index (BMI) 28.0-28.9, adult: Secondary | ICD-10-CM | POA: Diagnosis not present

## 2016-07-09 DIAGNOSIS — M25552 Pain in left hip: Secondary | ICD-10-CM | POA: Diagnosis not present

## 2016-07-09 DIAGNOSIS — M109 Gout, unspecified: Secondary | ICD-10-CM | POA: Diagnosis not present

## 2016-07-09 DIAGNOSIS — E663 Overweight: Secondary | ICD-10-CM | POA: Diagnosis not present

## 2016-07-09 DIAGNOSIS — M5136 Other intervertebral disc degeneration, lumbar region: Secondary | ICD-10-CM | POA: Diagnosis not present

## 2016-08-16 ENCOUNTER — Encounter: Payer: Self-pay | Admitting: Cardiology

## 2016-09-04 DIAGNOSIS — R972 Elevated prostate specific antigen [PSA]: Secondary | ICD-10-CM | POA: Diagnosis not present

## 2016-09-05 DIAGNOSIS — Z6827 Body mass index (BMI) 27.0-27.9, adult: Secondary | ICD-10-CM | POA: Diagnosis not present

## 2016-09-05 DIAGNOSIS — D631 Anemia in chronic kidney disease: Secondary | ICD-10-CM | POA: Diagnosis not present

## 2016-09-05 DIAGNOSIS — I129 Hypertensive chronic kidney disease with stage 1 through stage 4 chronic kidney disease, or unspecified chronic kidney disease: Secondary | ICD-10-CM | POA: Diagnosis not present

## 2016-09-05 DIAGNOSIS — N2581 Secondary hyperparathyroidism of renal origin: Secondary | ICD-10-CM | POA: Diagnosis not present

## 2016-09-05 DIAGNOSIS — N184 Chronic kidney disease, stage 4 (severe): Secondary | ICD-10-CM | POA: Diagnosis not present

## 2016-09-23 DIAGNOSIS — N184 Chronic kidney disease, stage 4 (severe): Secondary | ICD-10-CM | POA: Diagnosis not present

## 2016-09-23 DIAGNOSIS — E876 Hypokalemia: Secondary | ICD-10-CM | POA: Diagnosis not present

## 2016-09-30 ENCOUNTER — Other Ambulatory Visit: Payer: Self-pay | Admitting: *Deleted

## 2016-10-01 MED ORDER — ISOSORBIDE MONONITRATE ER 30 MG PO TB24: 30.0000 mg | ORAL_TABLET | Freq: Every day | ORAL | 2 refills | Status: DC

## 2016-10-02 DIAGNOSIS — R972 Elevated prostate specific antigen [PSA]: Secondary | ICD-10-CM | POA: Diagnosis not present

## 2016-10-18 DIAGNOSIS — N184 Chronic kidney disease, stage 4 (severe): Secondary | ICD-10-CM | POA: Diagnosis not present

## 2016-10-18 DIAGNOSIS — E7800 Pure hypercholesterolemia, unspecified: Secondary | ICD-10-CM | POA: Diagnosis not present

## 2016-10-18 DIAGNOSIS — I4891 Unspecified atrial fibrillation: Secondary | ICD-10-CM | POA: Diagnosis not present

## 2016-10-18 DIAGNOSIS — Z95810 Presence of automatic (implantable) cardiac defibrillator: Secondary | ICD-10-CM | POA: Diagnosis not present

## 2016-10-18 DIAGNOSIS — R7309 Other abnormal glucose: Secondary | ICD-10-CM | POA: Diagnosis not present

## 2016-10-18 DIAGNOSIS — Z1389 Encounter for screening for other disorder: Secondary | ICD-10-CM | POA: Diagnosis not present

## 2016-10-18 DIAGNOSIS — F17210 Nicotine dependence, cigarettes, uncomplicated: Secondary | ICD-10-CM | POA: Diagnosis not present

## 2016-10-18 DIAGNOSIS — I509 Heart failure, unspecified: Secondary | ICD-10-CM | POA: Diagnosis not present

## 2016-10-18 DIAGNOSIS — I10 Essential (primary) hypertension: Secondary | ICD-10-CM | POA: Diagnosis not present

## 2016-10-18 DIAGNOSIS — Z0000 Encounter for general adult medical examination without abnormal findings: Secondary | ICD-10-CM | POA: Diagnosis not present

## 2016-10-18 DIAGNOSIS — M109 Gout, unspecified: Secondary | ICD-10-CM | POA: Diagnosis not present

## 2016-10-18 DIAGNOSIS — R7303 Prediabetes: Secondary | ICD-10-CM | POA: Diagnosis not present

## 2016-10-18 DIAGNOSIS — J449 Chronic obstructive pulmonary disease, unspecified: Secondary | ICD-10-CM | POA: Diagnosis not present

## 2016-10-22 ENCOUNTER — Encounter: Payer: Self-pay | Admitting: Internal Medicine

## 2016-10-22 ENCOUNTER — Encounter (INDEPENDENT_AMBULATORY_CARE_PROVIDER_SITE_OTHER): Payer: Self-pay

## 2016-10-22 ENCOUNTER — Ambulatory Visit (INDEPENDENT_AMBULATORY_CARE_PROVIDER_SITE_OTHER): Payer: Medicare Other | Admitting: Internal Medicine

## 2016-10-22 VITALS — BP 106/62 | HR 73 | Ht 68.0 in | Wt 192.6 lb

## 2016-10-22 DIAGNOSIS — I5022 Chronic systolic (congestive) heart failure: Secondary | ICD-10-CM | POA: Diagnosis not present

## 2016-10-22 NOTE — Progress Notes (Signed)
HPI 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 He is a pleasant 76 yo man with an ICM, S/P ICD implant who has moved from [place] to [place]. He has class 2 CHF symptoms. He has a h/o CAD and PAF. He is stable and denies peripheral edema. No ICD shock. He has chronic stage 4 renal failure.  Allergies  Allergen Reactions  . Hymenoptera Venom Preparations   . Lisinopril Other (See Comments)    angioedema  . Piroxicam Other (See Comments)    burn  . Amoxicillin-Pot Clavulanate Rash    Has patient had a PCN reaction causing immediate rash, facial/tongue/throat swelling, SOB or lightheadedness with hypotension: Yes Has patient had a PCN reaction causing severe rash involving mucus membranes or skin necrosis: No Has patient had a PCN reaction that required hospitalization Yes Has patient had a PCN reaction occurring within the last 10 years: No If all of the above answers are "NO", then may proceed with Cephalosporin use.      Current Outpatient Prescriptions  Medication Sig Dispense Refill  . albuterol (PROVENTIL HFA;VENTOLIN HFA) 108 (90 Base) MCG/ACT inhaler Inhale 1 puff into the lungs every 6 (six) hours as needed for wheezing or shortness of breath.    . allopurinol (ZYLOPRIM) 300 MG tablet Take 300 mg by mouth daily.  6  . apixaban (ELIQUIS) 5 MG TABS tablet Take 5 mg by mouth daily.    Marland Kitchen atorvastatin (LIPITOR) 80 MG tablet Take 1 tablet (80 mg total) by mouth daily. 30 tablet 6  . carvedilol (COREG) 25 MG tablet Take 25 mg by mouth daily.  1  . diclofenac sodium (VOLTAREN) 1 % GEL Apply 2 g topically 4 (four) times daily.    . digoxin (LANOXIN) 0.125 MG tablet Take 125 mcg by mouth daily.    . Fluticasone-Salmeterol (ADVAIR) 250-50 MCG/DOSE AEPB Inhale 1 puff into the lungs 2 (two) times daily.    . furosemide (LASIX) 40 MG tablet Take 40 mg by mouth daily.  3  . hydrALAZINE (APRESOLINE) 25 MG tablet Take 1  tablet (25 mg total) by mouth 3 (three) times daily. 270 tablet 3  . HYDROcodone-acetaminophen (NORCO) 5-325 MG tablet Take 1-2 tablets by mouth every 6 (six) hours as needed. 20 tablet 0  . isosorbide mononitrate (IMDUR) 30 MG 24 hr tablet Take 1 tablet (30 mg total) by mouth daily. 90 tablet 2  . potassium chloride SA (K-DUR,KLOR-CON) 20 MEQ tablet Take 20 mEq by mouth daily.  6  . predniSONE (DELTASONE) 10 MG tablet Take 2 tablets (20 mg total) by mouth 2 (two) times daily. 20 tablet 0  . tiotropium (SPIRIVA) 18 MCG inhalation capsule Place 18 mcg into inhaler and inhale daily.     No current facility-administered medications for this visit.      Past Medical History:  Diagnosis Date  . Atrial fibrillation ([place])   . CHF (congestive heart failure) ([place])   . Coronary artery disease    Prior stent  . Degenerative joint disease   . Gout   . Hypercholesteremia   . Hypertension   . 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 Ischemic cardiomyopathy   . Renal insufficiency     ROS:   All systems reviewed and negative except as noted in the HPI.   Past Surgical History:  Procedure Laterality Date  . BACK SURGERY    . CARDIAC SURGERY    .  PACEMAKER PLACEMENT    . TOTAL SHOULDER REPLACEMENT    . WRIST SURGERY       Family History  Problem Relation Age of Onset  . Heart disease      No family history  . Diabetes Sister      Social History   Social History  . Marital status: Divorced    Spouse name: N/A  . Number of children: 4  . Years of education: N/A   Occupational History  . Not on file.   Social History Main Topics  . Smoking status: Current Some Day Smoker    Types: Cigars  . Smokeless tobacco: Never Used  . Alcohol use 0.0 oz/week     Comment: Occasional  . Drug use: Unknown  . Sexual activity: Not on file   Other Topics Concern  . Not on file   Social History Narrative  . No narrative on file     BP 106/62 (BP  Location: Left Arm)   Pulse 73   Ht 5\' 8"  (1.727 m)   Wt 192 lb 9.6 oz (87.4 kg)   BMI 29.28 kg/m   Physical Exam:  Well appearing 76 yo man, NAD HEENT: Unremarkable Neck:  6 cm JVD, no thyromegally Lymphatics:  No adenopathy Back:  No CVA tenderness Lungs:  Clear with no wheezes.  HEART:  Regular rate rhythm, no murmurs, no rubs, no clicks Abd:  soft, positive bowel sounds, no organomegally, no rebound, no guarding Ext:  2 plus pulses, no edema, no cyanosis, no clubbing Skin:  No rashes no nodules Neuro:  CN II through XII intact, motor grossly intact  DEVICE  Normal device function.  See PaceArt for details.   Assess/Plan: 1. PAF - he is maintaining NSR over 99% of the time. He will continue his current meds. 2. Chronic systolic heart failure - his EF has remained 35-40% and was so a year ago on echo. 3. ICM - he is s/p MI. He is pain free. He is encouraged to increase his physical activity. 4. ICD - his [employer] DDD ICD is working normally except for an elevated atrial threshold which persists. Will follow. I would anticipate placing a new atrial lead (and possibly an LV lead) when he reaches ERI.  [email or web address] [email or web address].

## 2016-10-22 NOTE — Patient Instructions (Addendum)
Medication Instructions:  Your physician recommends that you continue on your current medications as directed. Please refer to the Current Medication list given to you today.   Labwork: none  Testing/Procedures: none  Follow-Up: Your physician wants you to follow-up in: 6 months with Dr. Stanford Breed and 12 months with Dr. Lovena Le.  You will receive a reminder letter in the mail two months in advance. If you don't receive a letter, please call our office to schedule the follow-up appointment.   Any Other Special Instructions Will Be Listed Below (If Applicable).  Call us when you receive the monitor.    If you need a refill on your cardiac medications before your next appointment, please call your pharmacy.

## 2016-10-25 LAB — CUP PACEART INCLINIC DEVICE CHECK
Battery Remaining Percentage: 55 %
Brady Statistic RA Percent Paced: 3.6 %
HIGH POWER IMPEDANCE MEASURED VALUE: 43 Ohm
Implantable Lead Implant Date: 20140206
Implantable Lead Location: 753860
Lead Channel Impedance Value: 350 Ohm
Lead Channel Impedance Value: 380 Ohm
Lead Channel Pacing Threshold Amplitude: 1 V
Lead Channel Pacing Threshold Pulse Width: 0.5 ms
Lead Channel Pacing Threshold Pulse Width: 1 ms
Lead Channel Setting Sensing Sensitivity: 0.5 mV
MDC IDC LEAD IMPLANT DT: 20140206
MDC IDC LEAD LOCATION: 753859
MDC IDC MSMT BATTERY REMAINING LONGEVITY: 30 mo
MDC IDC MSMT LEADCHNL RA PACING THRESHOLD AMPLITUDE: 3.5 V
MDC IDC MSMT LEADCHNL RA SENSING INTR AMPL: 2.1 mV
MDC IDC MSMT LEADCHNL RV SENSING INTR AMPL: 11.5 mV
MDC IDC PG IMPLANT DT: 20170206
MDC IDC SESS DTM: 20180504090224
MDC IDC SET LEADCHNL RA PACING AMPLITUDE: 5 V
MDC IDC SET LEADCHNL RV PACING AMPLITUDE: 1 V
MDC IDC SET LEADCHNL RV PACING PULSEWIDTH: 0.5 ms
MDC IDC STAT BRADY RV PERCENT PACED: 23 %
Pulse Gen Serial Number: 7072725

## 2016-11-12 ENCOUNTER — Telehealth: Payer: Self-pay | Admitting: *Deleted

## 2016-11-12 NOTE — Telephone Encounter (Signed)
Device Tech RN called patient set patient up an appointment for patient to come into the office to get help setting up and pairing her home monitor. Pt didn't answer, Device Tech RN LMOVM.  Pt called back and stated that he has not received the new monitor. Verified address in [email or web address] website. Informed pt that I would call [facility] and get a ETA on monitor arrival at his home and call him back. Pt verbalized understanding.   Called [email or web address] and had patient a new monitor ordered to [facility]. Should arrive in 7-10 business days.  Called pt and informed him that a new monitor will be ordered and once the office receives it we will call him to schedule an appointment to come to office and get home monitoring set up. Pt verbalized understanding.

## 2016-11-12 NOTE — Telephone Encounter (Signed)
LMTCB/sss  Patient needs monitor paired.

## 2016-11-20 ENCOUNTER — Ambulatory Visit (INDEPENDENT_AMBULATORY_CARE_PROVIDER_SITE_OTHER): Payer: Medicare Other | Admitting: *Deleted

## 2016-11-20 DIAGNOSIS — I5022 Chronic systolic (congestive) heart failure: Secondary | ICD-10-CM

## 2016-11-20 DIAGNOSIS — I480 Paroxysmal atrial fibrillation: Secondary | ICD-10-CM

## 2016-11-20 DIAGNOSIS — Z95810 Presence of automatic (implantable) cardiac defibrillator: Secondary | ICD-10-CM

## 2016-11-20 NOTE — Progress Notes (Signed)
Home Monitor successfully paired.

## 2016-12-12 DIAGNOSIS — I129 Hypertensive chronic kidney disease with stage 1 through stage 4 chronic kidney disease, or unspecified chronic kidney disease: Secondary | ICD-10-CM | POA: Diagnosis not present

## 2016-12-12 DIAGNOSIS — D631 Anemia in chronic kidney disease: Secondary | ICD-10-CM | POA: Diagnosis not present

## 2016-12-12 DIAGNOSIS — Z6827 Body mass index (BMI) 27.0-27.9, adult: Secondary | ICD-10-CM | POA: Diagnosis not present

## 2016-12-12 DIAGNOSIS — N2581 Secondary hyperparathyroidism of renal origin: Secondary | ICD-10-CM | POA: Diagnosis not present

## 2016-12-12 DIAGNOSIS — N184 Chronic kidney disease, stage 4 (severe): Secondary | ICD-10-CM | POA: Diagnosis not present

## 2017-01-22 ENCOUNTER — Telehealth: Payer: Self-pay | Admitting: Cardiology

## 2017-01-22 ENCOUNTER — Ambulatory Visit (INDEPENDENT_AMBULATORY_CARE_PROVIDER_SITE_OTHER): Payer: Medicare Other | Admitting: *Deleted

## 2017-01-22 DIAGNOSIS — I5022 Chronic systolic (congestive) heart failure: Secondary | ICD-10-CM | POA: Diagnosis not present

## 2017-01-22 NOTE — Telephone Encounter (Signed)
Spoke with pt and reminded pt of remote transmission that is due today. Pt verbalized understanding.   

## 2017-01-22 NOTE — Progress Notes (Signed)
Remote ICD transmission.   

## 2017-01-23 ENCOUNTER — Encounter: Payer: Self-pay | Admitting: Cardiology

## 2017-02-12 DIAGNOSIS — R972 Elevated prostate specific antigen [PSA]: Secondary | ICD-10-CM | POA: Diagnosis not present

## 2017-02-19 DIAGNOSIS — R972 Elevated prostate specific antigen [PSA]: Secondary | ICD-10-CM | POA: Diagnosis not present

## 2017-02-28 LAB — CUP PACEART REMOTE DEVICE CHECK
Battery Remaining Longevity: 41 mo
Battery Remaining Percentage: 52 %
Brady Statistic AP VS Percent: 1.4 %
Brady Statistic AS VS Percent: 72 %
Date Time Interrogation Session: 20180801150546
HIGH POWER IMPEDANCE MEASURED VALUE: 44 Ohm
HIGH POWER IMPEDANCE MEASURED VALUE: 44 Ohm
Implantable Lead Implant Date: 20140206
Implantable Pulse Generator Implant Date: 20170206
Lead Channel Impedance Value: 390 Ohm
Lead Channel Pacing Threshold Amplitude: 3.5 V
Lead Channel Pacing Threshold Pulse Width: 0.5 ms
Lead Channel Pacing Threshold Pulse Width: 1 ms
Lead Channel Setting Pacing Amplitude: 1.25 V
Lead Channel Setting Sensing Sensitivity: 0.5 mV
MDC IDC LEAD IMPLANT DT: 20140206
MDC IDC LEAD LOCATION: 753859
MDC IDC LEAD LOCATION: 753860
MDC IDC MSMT BATTERY VOLTAGE: 2.93 V
MDC IDC MSMT LEADCHNL RA IMPEDANCE VALUE: 350 Ohm
MDC IDC MSMT LEADCHNL RA SENSING INTR AMPL: 0.8 mV
MDC IDC MSMT LEADCHNL RV PACING THRESHOLD AMPLITUDE: 1 V
MDC IDC MSMT LEADCHNL RV SENSING INTR AMPL: 11.5 mV
MDC IDC SET LEADCHNL RA PACING AMPLITUDE: 5 V
MDC IDC SET LEADCHNL RV PACING PULSEWIDTH: 0.5 ms
MDC IDC STAT BRADY AP VP PERCENT: 1 %
MDC IDC STAT BRADY AS VP PERCENT: 25 %
MDC IDC STAT BRADY RA PERCENT PACED: 1 %
MDC IDC STAT BRADY RV PERCENT PACED: 25 %
Pulse Gen Serial Number: 7072725

## 2017-03-17 DIAGNOSIS — D631 Anemia in chronic kidney disease: Secondary | ICD-10-CM | POA: Diagnosis not present

## 2017-03-17 DIAGNOSIS — N2581 Secondary hyperparathyroidism of renal origin: Secondary | ICD-10-CM | POA: Diagnosis not present

## 2017-03-17 DIAGNOSIS — I129 Hypertensive chronic kidney disease with stage 1 through stage 4 chronic kidney disease, or unspecified chronic kidney disease: Secondary | ICD-10-CM | POA: Diagnosis not present

## 2017-03-17 DIAGNOSIS — Z6827 Body mass index (BMI) 27.0-27.9, adult: Secondary | ICD-10-CM | POA: Diagnosis not present

## 2017-03-17 DIAGNOSIS — N184 Chronic kidney disease, stage 4 (severe): Secondary | ICD-10-CM | POA: Diagnosis not present

## 2017-03-24 DIAGNOSIS — H2513 Age-related nuclear cataract, bilateral: Secondary | ICD-10-CM | POA: Diagnosis not present

## 2017-04-10 DIAGNOSIS — H25813 Combined forms of age-related cataract, bilateral: Secondary | ICD-10-CM | POA: Diagnosis not present

## 2017-04-10 DIAGNOSIS — H40013 Open angle with borderline findings, low risk, bilateral: Secondary | ICD-10-CM | POA: Diagnosis not present

## 2017-04-16 NOTE — Progress Notes (Signed)
HPI:FU CAD and congestive heart failure. Previously cared for in [facility]. Patient was admitted in January 2017 with congestive heart failure. It was noted he had a history of atrial fibrillation, coronary artery disease, ischemic cardiomyopathy with ejection fraction 35-40%. Laboratories shows he has a creatinine of 3.30. Echocardiogram April 2017 showed ejection fraction 13-24%, grade 2 diastolic dysfunction, moderate left atrial enlargement and mild tricuspid regurgitation. 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 Since last seen, the patient denies any dyspnea on exertion, orthopnea, PND, pedal edema, palpitations, syncope or chest pain.   Current Outpatient Prescriptions  Medication Sig Dispense Refill  . albuterol (PROVENTIL HFA;VENTOLIN HFA) 108 (90 Base) MCG/ACT inhaler Inhale 1 puff into the lungs every 6 (six) hours as needed for wheezing or shortness of breath.    . allopurinol (ZYLOPRIM) 300 MG tablet Take 300 mg by mouth daily.  6  . apixaban (ELIQUIS) 5 MG TABS tablet Take 5 mg by mouth daily.    Marland Kitchen atorvastatin (LIPITOR) 80 MG tablet Take 1 tablet (80 mg total) by mouth daily. 30 tablet 6  . carvedilol (COREG) 25 MG tablet Take 25 mg by mouth daily.  1  . diclofenac sodium (VOLTAREN) 1 % GEL Apply 2 g topically 4 (four) times daily.    . digoxin (LANOXIN) 0.125 MG tablet Take 125 mcg by mouth daily.    . Fluticasone-Salmeterol (ADVAIR) 250-50 MCG/DOSE AEPB Inhale 1 puff into the lungs 2 (two) times daily.    . furosemide (LASIX) 40 MG tablet Take 40 mg by mouth daily.  3  . hydrALAZINE (APRESOLINE) 25 MG tablet Take 1 tablet (25 mg total) by mouth 3 (three) times daily. 270 tablet 3  . HYDROcodone-acetaminophen (NORCO) 5-325 MG tablet Take 1-2 tablets by mouth every 6 (six) hours as needed. 20 tablet 0  . isosorbide  mononitrate (IMDUR) 30 MG 24 hr tablet Take 1 tablet (30 mg total) by mouth daily. 90 tablet 2  . potassium chloride SA (K-DUR,KLOR-CON) 20 MEQ tablet Take 20 mEq by mouth daily.  6  . predniSONE (DELTASONE) 10 MG tablet Take 2 tablets (20 mg total) by mouth 2 (two) times daily. 20 tablet 0  . tiotropium (SPIRIVA) 18 MCG inhalation capsule Place 18 mcg into inhaler and inhale daily.     No current facility-administered medications for this visit.      Past Medical History:  Diagnosis Date  . Atrial fibrillation ([place])   . CHF (congestive heart failure) ([place])   . Coronary artery disease    Prior stent  . Degenerative joint disease   . Gout   . Hypercholesteremia   . Hypertension   . 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 Ischemic cardiomyopathy   . Renal insufficiency     Past Surgical History:  Procedure Laterality Date  . BACK SURGERY    . CARDIAC SURGERY    . PACEMAKER PLACEMENT    . TOTAL SHOULDER REPLACEMENT    . WRIST SURGERY      Social History   Social History  . Marital status: Divorced    Spouse name: N/A  . Number of children: 4  . Years of education: N/A   Occupational History  . Not on file.   Social History Main Topics  . Smoking status: Current Some Day Smoker    Types: Cigars  .  Smokeless tobacco: Never Used  . Alcohol use 0.0 oz/week     Comment: Occasional  . Drug use: Unknown  . Sexual activity: Not on file   Other Topics Concern  . Not on file   Social History Narrative  . No narrative on file    Family History  Problem Relation Age of Onset  . Heart disease Unknown        No family history  . Diabetes Sister     ROS: no fevers or chills, productive cough, hemoptysis, dysphasia, odynophagia, melena, hematochezia, dysuria, hematuria, rash, seizure activity, orthopnea, PND, pedal edema, claudication. Remaining systems are negative.  Physical Exam: Well-developed well-nourished in no acute distress.    Skin is warm and dry.  HEENT is normal.  Neck is supple.  Chest is clear to auscultation with normal expansion.  Cardiovascular exam is regular rate and rhythm.  Abdominal exam nontender or distended. No masses palpated. Extremities show no edema. neuro grossly intact  ECG- Sinus rhythm with first-degree AV block. Left axis deviation. Occasional PAC. Nonspecific ST changes. personally reviewed  A/P  1 ischemic cardiomyopathy-plan to continue beta blocker, hydralazine and nitrates. No ACE inhibitor or ARB given significant renal insufficiency. Continue present dose of diuretic. Patient is euvolemic on examination. Discontinue digoxin particularly in light of renal insufficiency.  2 coronary artery disease-continue statin. No aspirin given need for anticoagulation.  3 paroxysmal atrial fibrillation-patient is in sinus rhythm today. Continue apixaban and coreg. We will obtained his most recent laboratories from primary care including hemoglobin and renal function.  4 abdominal aortic aneurysm-schedule follow-up ultrasound.  5 prior ICD-followed by electrophysiology.  6 hyperlipidemia-continue statin. Obtain most recent laboratories from primary care including lipids and liver.  7 hypertension-blood pressure is controlled. Continue present medications.  8 renal insufficiency-followed by nephrology.   Kirk Ruths, MD

## 2017-04-21 DIAGNOSIS — H2512 Age-related nuclear cataract, left eye: Secondary | ICD-10-CM | POA: Diagnosis not present

## 2017-04-23 ENCOUNTER — Ambulatory Visit (INDEPENDENT_AMBULATORY_CARE_PROVIDER_SITE_OTHER): Payer: Medicare Other | Admitting: *Deleted

## 2017-04-23 DIAGNOSIS — I5022 Chronic systolic (congestive) heart failure: Secondary | ICD-10-CM | POA: Diagnosis not present

## 2017-04-23 DIAGNOSIS — Z95810 Presence of automatic (implantable) cardiac defibrillator: Secondary | ICD-10-CM

## 2017-04-23 DIAGNOSIS — I4891 Unspecified atrial fibrillation: Secondary | ICD-10-CM | POA: Diagnosis not present

## 2017-04-23 DIAGNOSIS — I10 Essential (primary) hypertension: Secondary | ICD-10-CM | POA: Diagnosis not present

## 2017-04-23 DIAGNOSIS — M109 Gout, unspecified: Secondary | ICD-10-CM | POA: Diagnosis not present

## 2017-04-23 DIAGNOSIS — I509 Heart failure, unspecified: Secondary | ICD-10-CM | POA: Diagnosis not present

## 2017-04-23 DIAGNOSIS — I480 Paroxysmal atrial fibrillation: Secondary | ICD-10-CM

## 2017-04-23 DIAGNOSIS — Z23 Encounter for immunization: Secondary | ICD-10-CM | POA: Diagnosis not present

## 2017-04-23 DIAGNOSIS — E7800 Pure hypercholesterolemia, unspecified: Secondary | ICD-10-CM | POA: Diagnosis not present

## 2017-04-23 DIAGNOSIS — J449 Chronic obstructive pulmonary disease, unspecified: Secondary | ICD-10-CM | POA: Diagnosis not present

## 2017-04-23 DIAGNOSIS — F17210 Nicotine dependence, cigarettes, uncomplicated: Secondary | ICD-10-CM | POA: Diagnosis not present

## 2017-04-23 DIAGNOSIS — R7303 Prediabetes: Secondary | ICD-10-CM | POA: Diagnosis not present

## 2017-04-23 DIAGNOSIS — N184 Chronic kidney disease, stage 4 (severe): Secondary | ICD-10-CM | POA: Diagnosis not present

## 2017-04-24 ENCOUNTER — Ambulatory Visit (INDEPENDENT_AMBULATORY_CARE_PROVIDER_SITE_OTHER): Payer: Medicare Other | Admitting: Cardiology

## 2017-04-24 ENCOUNTER — Encounter: Payer: Self-pay | Admitting: Cardiology

## 2017-04-24 VITALS — BP 128/64 | HR 72 | Ht 68.0 in | Wt 189.0 lb

## 2017-04-24 DIAGNOSIS — Z95810 Presence of automatic (implantable) cardiac defibrillator: Secondary | ICD-10-CM | POA: Diagnosis not present

## 2017-04-24 DIAGNOSIS — I480 Paroxysmal atrial fibrillation: Secondary | ICD-10-CM

## 2017-04-24 DIAGNOSIS — I2510 Atherosclerotic heart disease of native coronary artery without angina pectoris: Secondary | ICD-10-CM | POA: Diagnosis not present

## 2017-04-24 DIAGNOSIS — I714 Abdominal aortic aneurysm, without rupture, unspecified: Secondary | ICD-10-CM

## 2017-04-24 NOTE — Patient Instructions (Signed)
Medication Instructions:   STOP DIGOXIN  Testing/Procedures:  Your physician has requested that you have an abdominal aorta duplex. During this test, an ultrasound is used to evaluate the aorta. Allow 30 minutes for this exam. Do not eat after midnight the day before and avoid carbonated beverages   Follow-Up:  Your physician wants you to follow-up in: [place] will receive a reminder letter in the mail two months in advance. If you don't receive a letter, please call our office to schedule the follow-up appointment.   If you need a refill on your cardiac medications before your next appointment, please call your pharmacy.

## 2017-04-25 ENCOUNTER — Encounter: Payer: Self-pay | Admitting: Cardiology

## 2017-04-25 NOTE — Progress Notes (Signed)
Remote ICD transmission.   

## 2017-05-06 DIAGNOSIS — H2511 Age-related nuclear cataract, right eye: Secondary | ICD-10-CM | POA: Diagnosis not present

## 2017-05-12 DIAGNOSIS — H2511 Age-related nuclear cataract, right eye: Secondary | ICD-10-CM | POA: Diagnosis not present

## 2017-05-12 DIAGNOSIS — H25811 Combined forms of age-related cataract, right eye: Secondary | ICD-10-CM | POA: Diagnosis not present

## 2017-05-14 ENCOUNTER — Ambulatory Visit (HOSPITAL_COMMUNITY)
Admission: RE | Admit: 2017-05-14 | Discharge: 2017-05-14 | Disposition: A | Discharge status: Home / Self Care | Payer: Medicare Other | Source: Ambulatory Visit | Attending: Cardiology | Admitting: Cardiology

## 2017-05-14 DIAGNOSIS — I714 Abdominal aortic aneurysm, without rupture, unspecified: Secondary | ICD-10-CM

## 2017-05-14 DIAGNOSIS — I723 Aneurysm of iliac artery: Secondary | ICD-10-CM | POA: Insufficient documentation

## 2017-05-20 LAB — CUP PACEART REMOTE DEVICE CHECK
Battery Remaining Longevity: 40 mo
Battery Remaining Percentage: 49 %
Battery Voltage: 2.93 V
Brady Statistic AP VP Percent: 1 %
Date Time Interrogation Session: 20181031110909
HIGH POWER IMPEDANCE MEASURED VALUE: 45 Ohm
HighPow Impedance: 45 Ohm
Implantable Pulse Generator Implant Date: 20170206
Lead Channel Impedance Value: 410 Ohm
Lead Channel Pacing Threshold Pulse Width: 0.5 ms
Lead Channel Pacing Threshold Pulse Width: 1 ms
Lead Channel Sensing Intrinsic Amplitude: 11.5 mV
Lead Channel Setting Pacing Amplitude: 1.125
Lead Channel Setting Pacing Amplitude: 5 V
MDC IDC LEAD IMPLANT DT: 20140206
MDC IDC LEAD IMPLANT DT: 20140206
MDC IDC LEAD LOCATION: 753859
MDC IDC LEAD LOCATION: 753860
MDC IDC MSMT LEADCHNL RA IMPEDANCE VALUE: 360 Ohm
MDC IDC MSMT LEADCHNL RA PACING THRESHOLD AMPLITUDE: 3.5 V
MDC IDC MSMT LEADCHNL RA SENSING INTR AMPL: 1.2 mV
MDC IDC MSMT LEADCHNL RV PACING THRESHOLD AMPLITUDE: 0.875 V
MDC IDC PG SERIAL: 7072725
MDC IDC SET LEADCHNL RV PACING PULSEWIDTH: 0.5 ms
MDC IDC SET LEADCHNL RV SENSING SENSITIVITY: 0.5 mV
MDC IDC STAT BRADY AP VS PERCENT: 1.4 %
MDC IDC STAT BRADY AS VP PERCENT: 24 %
MDC IDC STAT BRADY AS VS PERCENT: 74 %
MDC IDC STAT BRADY RA PERCENT PACED: 1.1 %
MDC IDC STAT BRADY RV PERCENT PACED: 24 %

## 2017-06-11 DIAGNOSIS — N184 Chronic kidney disease, stage 4 (severe): Secondary | ICD-10-CM | POA: Diagnosis not present

## 2017-06-11 DIAGNOSIS — I129 Hypertensive chronic kidney disease with stage 1 through stage 4 chronic kidney disease, or unspecified chronic kidney disease: Secondary | ICD-10-CM | POA: Diagnosis not present

## 2017-06-11 DIAGNOSIS — N2581 Secondary hyperparathyroidism of renal origin: Secondary | ICD-10-CM | POA: Diagnosis not present

## 2017-06-11 DIAGNOSIS — D631 Anemia in chronic kidney disease: Secondary | ICD-10-CM | POA: Diagnosis not present

## 2017-06-11 DIAGNOSIS — Z6827 Body mass index (BMI) 27.0-27.9, adult: Secondary | ICD-10-CM | POA: Diagnosis not present

## 2017-07-23 ENCOUNTER — Ambulatory Visit (INDEPENDENT_AMBULATORY_CARE_PROVIDER_SITE_OTHER): Payer: Medicare Other | Admitting: *Deleted

## 2017-07-23 DIAGNOSIS — I5022 Chronic systolic (congestive) heart failure: Secondary | ICD-10-CM

## 2017-07-23 NOTE — Progress Notes (Signed)
Remote ICD transmission.   

## 2017-07-24 ENCOUNTER — Encounter: Payer: Self-pay | Admitting: Cardiology

## 2017-08-07 LAB — CUP PACEART REMOTE DEVICE CHECK
Battery Voltage: 2.93 V
Brady Statistic AP VP Percent: 1 %
Brady Statistic AS VP Percent: 21 %
HighPow Impedance: 44 Ohm
HighPow Impedance: 44 Ohm
Implantable Lead Implant Date: 20140206
Implantable Lead Location: 753860
Implantable Pulse Generator Implant Date: 20170206
Lead Channel Impedance Value: 350 Ohm
Lead Channel Pacing Threshold Amplitude: 3.5 V
Lead Channel Pacing Threshold Pulse Width: 1 ms
Lead Channel Sensing Intrinsic Amplitude: 1.4 mV
Lead Channel Sensing Intrinsic Amplitude: 11.5 mV
MDC IDC LEAD IMPLANT DT: 20140206
MDC IDC LEAD LOCATION: 753859
MDC IDC MSMT BATTERY REMAINING LONGEVITY: 38 mo
MDC IDC MSMT BATTERY REMAINING PERCENTAGE: 48 %
MDC IDC MSMT LEADCHNL RV IMPEDANCE VALUE: 390 Ohm
MDC IDC MSMT LEADCHNL RV PACING THRESHOLD AMPLITUDE: 1 V
MDC IDC MSMT LEADCHNL RV PACING THRESHOLD PULSEWIDTH: 0.5 ms
MDC IDC SESS DTM: 20190130114200
MDC IDC SET LEADCHNL RA PACING AMPLITUDE: 5 V
MDC IDC SET LEADCHNL RV PACING AMPLITUDE: 1.25 V
MDC IDC SET LEADCHNL RV PACING PULSEWIDTH: 0.5 ms
MDC IDC SET LEADCHNL RV SENSING SENSITIVITY: 0.5 mV
MDC IDC STAT BRADY AP VS PERCENT: 1.4 %
MDC IDC STAT BRADY AS VS PERCENT: 76 %
MDC IDC STAT BRADY RA PERCENT PACED: 1.1 %
MDC IDC STAT BRADY RV PERCENT PACED: 21 %
Pulse Gen Serial Number: 7072725

## 2017-09-11 DIAGNOSIS — N2581 Secondary hyperparathyroidism of renal origin: Secondary | ICD-10-CM | POA: Diagnosis not present

## 2017-09-11 DIAGNOSIS — D631 Anemia in chronic kidney disease: Secondary | ICD-10-CM | POA: Diagnosis not present

## 2017-09-11 DIAGNOSIS — N184 Chronic kidney disease, stage 4 (severe): Secondary | ICD-10-CM | POA: Diagnosis not present

## 2017-09-11 DIAGNOSIS — I129 Hypertensive chronic kidney disease with stage 1 through stage 4 chronic kidney disease, or unspecified chronic kidney disease: Secondary | ICD-10-CM | POA: Diagnosis not present

## 2017-09-11 DIAGNOSIS — Z6827 Body mass index (BMI) 27.0-27.9, adult: Secondary | ICD-10-CM | POA: Diagnosis not present

## 2017-10-22 ENCOUNTER — Ambulatory Visit (INDEPENDENT_AMBULATORY_CARE_PROVIDER_SITE_OTHER): Payer: Medicare Other | Admitting: *Deleted

## 2017-10-22 DIAGNOSIS — I5022 Chronic systolic (congestive) heart failure: Secondary | ICD-10-CM | POA: Diagnosis not present

## 2017-10-22 NOTE — Progress Notes (Signed)
Remote ICD transmission.   

## 2017-10-23 ENCOUNTER — Encounter: Payer: Self-pay | Admitting: Cardiology

## 2017-10-27 DIAGNOSIS — N184 Chronic kidney disease, stage 4 (severe): Secondary | ICD-10-CM | POA: Diagnosis not present

## 2017-10-27 DIAGNOSIS — R972 Elevated prostate specific antigen [PSA]: Secondary | ICD-10-CM | POA: Diagnosis not present

## 2017-10-27 DIAGNOSIS — R7303 Prediabetes: Secondary | ICD-10-CM | POA: Diagnosis not present

## 2017-10-27 DIAGNOSIS — Z0000 Encounter for general adult medical examination without abnormal findings: Secondary | ICD-10-CM | POA: Diagnosis not present

## 2017-10-27 DIAGNOSIS — I4891 Unspecified atrial fibrillation: Secondary | ICD-10-CM | POA: Diagnosis not present

## 2017-10-27 DIAGNOSIS — M109 Gout, unspecified: Secondary | ICD-10-CM | POA: Diagnosis not present

## 2017-10-27 DIAGNOSIS — Z95810 Presence of automatic (implantable) cardiac defibrillator: Secondary | ICD-10-CM | POA: Diagnosis not present

## 2017-10-27 DIAGNOSIS — Z1389 Encounter for screening for other disorder: Secondary | ICD-10-CM | POA: Diagnosis not present

## 2017-10-27 DIAGNOSIS — I10 Essential (primary) hypertension: Secondary | ICD-10-CM | POA: Diagnosis not present

## 2017-10-27 DIAGNOSIS — E7800 Pure hypercholesterolemia, unspecified: Secondary | ICD-10-CM | POA: Diagnosis not present

## 2017-10-27 DIAGNOSIS — F17210 Nicotine dependence, cigarettes, uncomplicated: Secondary | ICD-10-CM | POA: Diagnosis not present

## 2017-10-27 DIAGNOSIS — I509 Heart failure, unspecified: Secondary | ICD-10-CM | POA: Diagnosis not present

## 2017-10-27 DIAGNOSIS — J449 Chronic obstructive pulmonary disease, unspecified: Secondary | ICD-10-CM | POA: Diagnosis not present

## 2017-11-11 LAB — CUP PACEART REMOTE DEVICE CHECK
Battery Remaining Longevity: 36 mo
Brady Statistic AP VP Percent: 1 %
Brady Statistic AP VS Percent: 1.7 %
Brady Statistic AS VP Percent: 20 %
Brady Statistic AS VS Percent: 76 %
Brady Statistic RA Percent Paced: 1.2 %
Brady Statistic RV Percent Paced: 21 %
HIGH POWER IMPEDANCE MEASURED VALUE: 46 Ohm
HighPow Impedance: 46 Ohm
Implantable Lead Implant Date: 20140206
Implantable Lead Location: 753859
Implantable Pulse Generator Implant Date: 20170206
Lead Channel Impedance Value: 430 Ohm
Lead Channel Pacing Threshold Amplitude: 3.5 V
Lead Channel Pacing Threshold Pulse Width: 0.5 ms
Lead Channel Setting Pacing Amplitude: 1.25 V
Lead Channel Setting Pacing Pulse Width: 0.5 ms
Lead Channel Setting Sensing Sensitivity: 0.5 mV
MDC IDC LEAD IMPLANT DT: 20140206
MDC IDC LEAD LOCATION: 753860
MDC IDC MSMT BATTERY REMAINING PERCENTAGE: 45 %
MDC IDC MSMT BATTERY VOLTAGE: 2.93 V
MDC IDC MSMT LEADCHNL RA IMPEDANCE VALUE: 350 Ohm
MDC IDC MSMT LEADCHNL RA PACING THRESHOLD PULSEWIDTH: 1 ms
MDC IDC MSMT LEADCHNL RA SENSING INTR AMPL: 1.2 mV
MDC IDC MSMT LEADCHNL RV PACING THRESHOLD AMPLITUDE: 1 V
MDC IDC MSMT LEADCHNL RV SENSING INTR AMPL: 11.5 mV
MDC IDC SESS DTM: 20190501101551
MDC IDC SET LEADCHNL RA PACING AMPLITUDE: 5 V
Pulse Gen Serial Number: 7072725

## 2017-11-28 ENCOUNTER — Other Ambulatory Visit: Payer: Self-pay | Admitting: *Deleted

## 2017-11-28 DIAGNOSIS — I714 Abdominal aortic aneurysm, without rupture, unspecified: Secondary | ICD-10-CM

## 2017-12-17 DIAGNOSIS — N184 Chronic kidney disease, stage 4 (severe): Secondary | ICD-10-CM | POA: Diagnosis not present

## 2017-12-17 DIAGNOSIS — M109 Gout, unspecified: Secondary | ICD-10-CM | POA: Diagnosis not present

## 2017-12-17 DIAGNOSIS — I714 Abdominal aortic aneurysm, without rupture: Secondary | ICD-10-CM | POA: Diagnosis not present

## 2017-12-17 DIAGNOSIS — E876 Hypokalemia: Secondary | ICD-10-CM | POA: Diagnosis not present

## 2017-12-17 DIAGNOSIS — I2510 Atherosclerotic heart disease of native coronary artery without angina pectoris: Secondary | ICD-10-CM | POA: Diagnosis not present

## 2017-12-17 DIAGNOSIS — N2581 Secondary hyperparathyroidism of renal origin: Secondary | ICD-10-CM | POA: Diagnosis not present

## 2017-12-17 DIAGNOSIS — D631 Anemia in chronic kidney disease: Secondary | ICD-10-CM | POA: Diagnosis not present

## 2017-12-17 DIAGNOSIS — I129 Hypertensive chronic kidney disease with stage 1 through stage 4 chronic kidney disease, or unspecified chronic kidney disease: Secondary | ICD-10-CM | POA: Diagnosis not present

## 2017-12-17 DIAGNOSIS — I4891 Unspecified atrial fibrillation: Secondary | ICD-10-CM | POA: Diagnosis not present

## 2017-12-17 DIAGNOSIS — I255 Ischemic cardiomyopathy: Secondary | ICD-10-CM | POA: Diagnosis not present

## 2017-12-19 DIAGNOSIS — H40013 Open angle with borderline findings, low risk, bilateral: Secondary | ICD-10-CM | POA: Diagnosis not present

## 2018-01-04 ENCOUNTER — Other Ambulatory Visit: Payer: Self-pay

## 2018-01-04 ENCOUNTER — Emergency Department (HOSPITAL_COMMUNITY)
Admission: EM | Admit: 2018-01-04 | Discharge: 2018-01-05 | Disposition: A | Discharge status: Home / Self Care | Payer: Medicare Other | Attending: Emergency Medicine | Admitting: Emergency Medicine

## 2018-01-04 ENCOUNTER — Encounter (HOSPITAL_COMMUNITY): Payer: Self-pay | Admitting: Emergency Medicine

## 2018-01-04 ENCOUNTER — Emergency Department (HOSPITAL_COMMUNITY): Payer: Medicare Other

## 2018-01-04 DIAGNOSIS — Z950 Presence of cardiac pacemaker: Secondary | ICD-10-CM | POA: Insufficient documentation

## 2018-01-04 DIAGNOSIS — N189 Chronic kidney disease, unspecified: Secondary | ICD-10-CM | POA: Diagnosis not present

## 2018-01-04 DIAGNOSIS — R079 Chest pain, unspecified: Secondary | ICD-10-CM | POA: Diagnosis not present

## 2018-01-04 DIAGNOSIS — R0789 Other chest pain: Secondary | ICD-10-CM | POA: Diagnosis not present

## 2018-01-04 DIAGNOSIS — Z7901 Long term (current) use of anticoagulants: Secondary | ICD-10-CM | POA: Diagnosis not present

## 2018-01-04 DIAGNOSIS — I509 Heart failure, unspecified: Secondary | ICD-10-CM | POA: Insufficient documentation

## 2018-01-04 DIAGNOSIS — Z79899 Other long term (current) drug therapy: Secondary | ICD-10-CM | POA: Insufficient documentation

## 2018-01-04 DIAGNOSIS — I130 Hypertensive heart and chronic kidney disease with heart failure and stage 1 through stage 4 chronic kidney disease, or unspecified chronic kidney disease: Secondary | ICD-10-CM | POA: Diagnosis not present

## 2018-01-04 DIAGNOSIS — I2510 Atherosclerotic heart disease of native coronary artery without angina pectoris: Secondary | ICD-10-CM | POA: Insufficient documentation

## 2018-01-04 DIAGNOSIS — F17210 Nicotine dependence, cigarettes, uncomplicated: Secondary | ICD-10-CM | POA: Insufficient documentation

## 2018-01-04 DIAGNOSIS — I440 Atrioventricular block, first degree: Secondary | ICD-10-CM | POA: Diagnosis not present

## 2018-01-04 LAB — I-STAT TROPONIN, ED: Troponin i, poc: 0 ng/mL (ref 0.00–0.08)

## 2018-01-04 LAB — CBC
HEMATOCRIT: 39.9 % (ref 39.0–52.0)
Hemoglobin: 12.3 g/dL — ABNORMAL LOW (ref 13.0–17.0)
MCH: 27.3 pg (ref 26.0–34.0)
MCHC: 30.8 g/dL (ref 30.0–36.0)
MCV: 88.5 fL (ref 78.0–100.0)
PLATELETS: 158 10*3/uL (ref 150–400)
RBC: 4.51 MIL/uL (ref 4.22–5.81)
RDW: 16.1 % — ABNORMAL HIGH (ref 11.5–15.5)
WBC: 10.6 10*3/uL — ABNORMAL HIGH (ref 4.0–10.5)

## 2018-01-04 LAB — BASIC METABOLIC PANEL
Anion gap: 9 (ref 5–15)
BUN: 43 mg/dL — ABNORMAL HIGH (ref 8–23)
CHLORIDE: 109 mmol/L (ref 98–111)
CO2: 25 mmol/L (ref 22–32)
CREATININE: 3.23 mg/dL — AB (ref 0.61–1.24)
Calcium: 9.1 mg/dL (ref 8.9–10.3)
GFR calc non Af Amer: 17 mL/min — ABNORMAL LOW (ref >60)
GFR, EST AFRICAN AMERICAN: 20 mL/min — AB (ref >60)
Glucose, Bld: 111 mg/dL — ABNORMAL HIGH (ref 70–99)
Potassium: 3.3 mmol/L — ABNORMAL LOW (ref 3.5–5.1)
Sodium: 143 mmol/L (ref 135–145)

## 2018-01-04 LAB — BRAIN NATRIURETIC PEPTIDE: B Natriuretic Peptide: 170.1 pg/mL — ABNORMAL HIGH (ref 0.0–100.0)

## 2018-01-04 NOTE — ED Provider Notes (Signed)
[facility] EMERGENCY DEPARTMENT Provider Note   CSN: [id number] Arrival date & time: 01/04/18  2154     History   Chief Complaint Chief Complaint  Patient presents with  . Chest Pain    HPI [name] is a 77 y.o. male.  The history is provided by the patient and medical records.  Chest Pain      77 y.o. M with hx of AFIB on Eliquis, CHF with EF of 35-40%, CAD, DJD, gout, HLP, HTN, CKD, presenting to the ED for chest pain.  Patient states pain actually started yesterday but got worse today.  States yesterday he spent most of the day sitting on his porch and watching the cows in the field.  States sometimes when out in the heat like that he will start having some pains.  States today he was lying down watching TV, rolled over and pain got worse.  States initially it was about an 8/10, now 6/10 but not really bothering him much.  States it feels like a dull ache in the left side of his chest just next to his defibrillator, little bit of pain into the left side of the neck. No SOB, diaphoresis, nausea, vomiting, weakness, feelings of syncope.  Does report a slight cough but no fever/chills.  No sick contacts.  Patient follows with cardiology, Dr. Stanford Breed.  Past Medical History:  Diagnosis Date  . Atrial fibrillation ([place])   . CHF (congestive heart failure) ([place])   . Coronary artery disease    Prior stent  . Degenerative joint disease   . Gout   . Hypercholesteremia   . Hypertension   . 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 Ischemic cardiomyopathy   . Renal insufficiency     Patient Active Problem List   Diagnosis Date Noted  . CAD (coronary artery disease) 09/12/2015  . Renal insufficiency 09/12/2015  . Essential hypertension 09/12/2015  . Hyperlipidemia 09/12/2015  . CHF (congestive heart failure) ([place]) 07/07/2015  . Right knee pain 07/07/2015  . Pedal edema 07/07/2015  . Fluid overload 07/07/2015  . Atrial  fibrillation ([place]) 07/07/2015  . Gout 07/07/2015  . ICD (implantable cardioverter-defibrillator) in place 07/07/2015    Past Surgical History:  Procedure Laterality Date  . BACK SURGERY    . CARDIAC SURGERY    . PACEMAKER PLACEMENT    . TOTAL SHOULDER REPLACEMENT    . WRIST SURGERY          Home Medications    Prior to Admission medications   Medication Sig Start Date End Date Taking? Authorizing Provider  albuterol (PROVENTIL HFA;VENTOLIN HFA) 108 (90 Base) MCG/ACT inhaler Inhale 1 puff into the lungs every 6 (six) hours as needed for wheezing or shortness of breath.    [provider]  allopurinol (ZYLOPRIM) 300 MG tablet Take 300 mg by mouth daily. 07/26/16   [provider]  apixaban (ELIQUIS) 5 MG TABS tablet Take 5 mg by mouth daily.    [provider]  atorvastatin (LIPITOR) 80 MG tablet Take 1 tablet (80 mg total) by mouth daily. 09/12/15   Lelon Perla, MD  carvedilol (COREG) 25 MG tablet Take 25 mg by mouth daily. 09/21/16   [provider]  diclofenac sodium (VOLTAREN) 1 % GEL Apply 2 g topically 4 (four) times daily.    [provider]  Fluticasone-Salmeterol (ADVAIR) 250-50 MCG/DOSE AEPB Inhale 1 puff into the lungs 2 (two) times daily.    [provider]  furosemide (LASIX) 40 MG tablet Take 40 mg by mouth daily. 07/21/16   [provider]  hydrALAZINE (APRESOLINE) 25 MG tablet Take 1 tablet (25 mg total) by mouth 3 (three) times daily. 09/12/15   Lelon Perla, MD  HYDROcodone-acetaminophen (NORCO) 5-325 MG tablet Take 1-2 tablets by mouth every 6 (six) hours as needed. 12/28/15   Veryl Speak, MD  isosorbide mononitrate (IMDUR) 30 MG 24 hr tablet Take 1 tablet (30 mg total) by mouth daily. 10/01/16   Lelon Perla, MD  potassium chloride SA (K-DUR,KLOR-CON) 20 MEQ tablet Take 20 mEq by mouth daily. 09/23/16   [provider]  predniSONE (DELTASONE) 10 MG tablet Take 2 tablets (20 mg total) by  mouth 2 (two) times daily. 12/28/15   Veryl Speak, MD  tiotropium (SPIRIVA) 18 MCG inhalation capsule Place 18 mcg into inhaler and inhale daily.    [provider]    Family History Family History  Problem Relation Age of Onset  . Heart disease Unknown        No family history  . Diabetes Sister     Social History Social History   Tobacco Use  . Smoking status: Current Some Day Smoker    Types: Cigars  . Smokeless tobacco: Never Used  Substance Use Topics  . Alcohol use: Yes    Alcohol/week: 0.0 oz    Comment: Occasional  . Drug use: Not on file     Allergies   Hymenoptera venom preparations; Lisinopril; Piroxicam; and Amoxicillin-pot clavulanate   Review of Systems Review of Systems  Cardiovascular: Positive for chest pain.  All other systems reviewed and are negative.    Physical Exam Updated Vital Signs BP 107/63 (BP Location: Right Arm)   Pulse 60   Temp 97.9 F (36.6 C) (Oral)   Resp 15   SpO2 100%   Physical Exam  Constitutional: He is oriented to person, place, and time. He appears well-developed and well-nourished.  HENT:  Head: Normocephalic and atraumatic.  Mouth/Throat: Oropharynx is clear and moist.  Eyes: Pupils are equal, round, and reactive to light. Conjunctivae and EOM are normal.  Neck: Normal range of motion.  Cardiovascular: Normal rate, regular rhythm and normal heart sounds.  Pulmonary/Chest: Effort normal and breath sounds normal. He has no decreased breath sounds. He has no wheezes.  Mild tenderness of the left chest wall just adjacent to defibrillator; no signs of overlying skin changes to suggest infection, no signs of trauma, no chest wall deformities, lungs clear  Abdominal: Soft. Bowel sounds are normal.  Musculoskeletal: Normal range of motion.  Neurological: He is alert and oriented to person, place, and time.  Skin: Skin is warm and dry.  Psychiatric: He has a normal mood and affect.  Nursing note and vitals  reviewed.    ED Treatments / Results  Labs (all labs ordered are listed, but only abnormal results are displayed) Labs Reviewed  BASIC METABOLIC PANEL - Abnormal; Notable for the following components:      Result Value   Potassium 3.3 (*)    Glucose, Bld 111 (*)    BUN 43 (*)    Creatinine, Ser 3.23 (*)    GFR calc non Af Amer 17 (*)    GFR calc Af Amer 20 (*)    All other components within normal limits  CBC - Abnormal; Notable for the following components:   WBC 10.6 (*)    Hemoglobin 12.3 (*)    RDW 16.1 (*)  All other components within normal limits  BRAIN NATRIURETIC PEPTIDE - Abnormal; Notable for the following components:   B Natriuretic Peptide 170.1 (*)    All other components within normal limits  I-STAT TROPONIN, ED  I-STAT TROPONIN, ED    EKG EKG Interpretation  Date/Time:  Sunday January 04 2018 22:03:15 EDT Ventricular Rate:  62 PR Interval:    QRS Duration: 92 QT Interval:  418 QTC Calculation: 425 R Axis:   -59 Text Interpretation:  Sinus rhythm Paired ventricular premature complexes Aberrant complex Prolonged PR interval Left anterior fascicular block Low voltage, precordial leads Borderline T wave abnormalities T wave changes similar to July 2017 Confirmed by Sherwood Gambler [phone]) on 01/04/2018 10:06:45 PM   Radiology Dg Chest 2 View  Result Date: 01/04/2018 CLINICAL DATA:  77 year old male with chest pain. EXAM: CHEST - 2 VIEW COMPARISON:  Chest radiograph dated 05/01/2016 FINDINGS: The lungs are clear. There is no pleural effusion or pneumothorax. The cardiac silhouette is within normal limits. Left pectoral AICD device. No acute osseous pathology. IMPRESSION: No active cardiopulmonary disease. Electronically Signed   By: Anner Crete M.D.   On: 01/04/2018 22:41    Procedures Procedures (including critical care time)  Medications Ordered in ED Medications - No data to display   Initial Impression / Assessment and Plan / ED Course  I have  reviewed the triage vital signs and the nursing notes.  Pertinent labs & imaging results that were available during my care of the patient were reviewed by me and considered in my medical decision making (see chart for details).  77 year old male here with chest pain.  Began yesterday, worsening today.  Has had slight cough but no significant shortness of breath, diaphoresis, nausea, vomiting, dizziness, weakness.  Has known history of CAD.  Defibrillator in place, no recent events.  Vital signs are stable.  He is afebrile and nontoxic.  Some mild chest wall tenderness just adjacent to his defibrillator but no signs of overlying infection or trauma to the chest wall.  EKG today largely unchanged from prior.  Labs overall reassuring, troponin negative.  Chest x-ray is clear.  Symptoms seem somewhat atypical, however given patient's age and cardiac risk factors, will obtain delta troponin.  Delta troponin negative.  Patient is anticoagulated with Eliquis and I have low suspicion for PE.  Given negative work-up after 24+ hours of symptoms, feel he is stable for discharge home.  We will have him follow-up closely with his cardiologist.  Discussed plan with patient, he acknowledged understanding and agreed with plan of care.  Return precautions given for new or worsening symptoms.  Patient seen and evaluated with attending physician, Dr. Regenia Skeeter, who agrees with assessment and plan of care.  Final Clinical Impressions(s) / ED Diagnoses   Final diagnoses:  Chest pain in adult    ED Discharge Orders    None       Larene Pickett, PA-C 01/05/18 0401    Sherwood Gambler, MD 01/08/18 2125

## 2018-01-04 NOTE — ED Notes (Signed)
Pt reports he has had this pain for the past 2 days. Pt states that tonight while laying down in bed and watching television, the pain became more intense and sharp.

## 2018-01-04 NOTE — ED Triage Notes (Signed)
Pt to ED via [facility] with c/o left chest pain onset earlier tonight.  Pt st's he layed down and when he turned over the pain increased

## 2018-01-05 DIAGNOSIS — R079 Chest pain, unspecified: Secondary | ICD-10-CM | POA: Diagnosis not present

## 2018-01-05 LAB — I-STAT TROPONIN, ED: Troponin i, poc: 0.01 ng/mL (ref 0.00–0.08)

## 2018-01-05 NOTE — Discharge Instructions (Signed)
Your cardiac tests today looked great! Follow-up with Dr. Stanford Breed in office. Return to the ED for new or worsening symptoms.

## 2018-01-14 ENCOUNTER — Ambulatory Visit (INDEPENDENT_AMBULATORY_CARE_PROVIDER_SITE_OTHER): Payer: Medicare Other | Admitting: Cardiology

## 2018-01-14 ENCOUNTER — Encounter: Payer: Self-pay | Admitting: Cardiology

## 2018-01-14 VITALS — BP 126/64 | HR 72 | Ht 68.0 in | Wt 190.2 lb

## 2018-01-14 DIAGNOSIS — N184 Chronic kidney disease, stage 4 (severe): Secondary | ICD-10-CM | POA: Diagnosis not present

## 2018-01-14 DIAGNOSIS — Z7901 Long term (current) use of anticoagulants: Secondary | ICD-10-CM

## 2018-01-14 DIAGNOSIS — R079 Chest pain, unspecified: Secondary | ICD-10-CM | POA: Diagnosis not present

## 2018-01-14 DIAGNOSIS — N2889 Other specified disorders of kidney and ureter: Secondary | ICD-10-CM

## 2018-01-14 DIAGNOSIS — I2510 Atherosclerotic heart disease of native coronary artery without angina pectoris: Secondary | ICD-10-CM | POA: Diagnosis not present

## 2018-01-14 DIAGNOSIS — R072 Precordial pain: Secondary | ICD-10-CM

## 2018-01-14 DIAGNOSIS — Z9861 Coronary angioplasty status: Secondary | ICD-10-CM

## 2018-01-14 DIAGNOSIS — I480 Paroxysmal atrial fibrillation: Secondary | ICD-10-CM

## 2018-01-14 DIAGNOSIS — I255 Ischemic cardiomyopathy: Secondary | ICD-10-CM | POA: Diagnosis not present

## 2018-01-14 DIAGNOSIS — N185 Chronic kidney disease, stage 5: Secondary | ICD-10-CM | POA: Insufficient documentation

## 2018-01-14 DIAGNOSIS — Z95810 Presence of automatic (implantable) cardiac defibrillator: Secondary | ICD-10-CM | POA: Diagnosis not present

## 2018-01-14 DIAGNOSIS — N179 Acute kidney failure, unspecified: Secondary | ICD-10-CM | POA: Insufficient documentation

## 2018-01-14 MED ORDER — NITROGLYCERIN 0.4 MG SL SUBL: 0.4000 mg | SUBLINGUAL_TABLET | SUBLINGUAL | 3 refills | Status: DC | PRN

## 2018-01-14 NOTE — Progress Notes (Signed)
01/14/2018 Anthony Foster [facility]   [birth year]  [id number]  Primary Physician Wenda Low, MD Primary Cardiologist: Dr Marella Chimes Dr Lovena Le  HPI:  Interesting 77 y/o disable (back) [place] with a history of CAD and ICM. In 2012 he had a stent placed to one artery in [place]. In 2014 he had a cath ("no stents placed") in [place] followed by a [place] ICD 07/30/12. Echo in 2017 showed his EF to be 35-40%. He then moved to the [place] area and is followed by Dr Stanford Breed and Dr Lovena Le. Other medical problems include CRI-4 followed by Dr Marval Regal, PAF-holding NSR, chronic anticoagulation, HTN, and HLD. He has done well from a cardiac standpoint. 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 He has not had CHF or angina. His ICD has not fired. He has a history of PAF but has been maintaning NSR.   Recently he was outside in the heat and had some chest pain "pressure". He went inside to rest and his symptoms abated. A day or two later he had another episode of chest pain while he was inside at rest. He denies any associated nausea, vomiting, diaphoresis, or radiation to his jaw or arms. He does not have NTG. He called EMS and was evaluated in th ED. He ruled out for an MI. He was instructed to f/u here and is seen today for that follow up. He has not had recurrent symptoms.    Current Outpatient Medications  Medication Sig Dispense Refill  . allopurinol (ZYLOPRIM) 300 MG tablet Take 300 mg by mouth daily.  6  . apixaban (ELIQUIS) 5 MG TABS tablet Take 5 mg by mouth daily.    Marland Kitchen atorvastatin (LIPITOR) 80 MG tablet Take 1 tablet (80 mg total) by mouth daily. 30 tablet 6  . carvedilol (COREG) 25 MG tablet Take 25 mg by mouth daily.  1  . furosemide (LASIX) 40 MG tablet Take 40 mg by mouth daily.  3  . hydrALAZINE (APRESOLINE) 25 MG tablet Take 1 tablet (25 mg total) by mouth 3 (three) times daily. (Patient taking  differently: Take 25 mg by mouth daily. ) 270 tablet 3  . isosorbide mononitrate (IMDUR) 30 MG 24 hr tablet Take 1 tablet (30 mg total) by mouth daily. 90 tablet 2  . potassium chloride SA (K-DUR,KLOR-CON) 20 MEQ tablet Take 10 mEq by mouth daily.   6  . nitroGLYCERIN (NITROSTAT) 0.4 MG SL tablet Place 1 tablet (0.4 mg total) under the tongue every 5 (five) minutes as needed for chest pain. 25 tablet 3   No current facility-administered medications for this visit.     Allergies  Allergen Reactions  . Hymenoptera Venom Preparations Hives    Reaction to wasps  . Lisinopril Other (See Comments)    angioedema  . Piroxicam Other (See Comments)    burn  . Amoxicillin-Pot Clavulanate Rash    Has patient had a PCN reaction causing immediate rash, facial/tongue/throat swelling, SOB or lightheadedness with hypotension: Yes Has patient had a PCN reaction causing severe rash involving mucus membranes or skin necrosis: No Has patient had a PCN reaction that required hospitalization Yes Has patient had a PCN reaction occurring within the last 10 years: No If all of the above answers are "NO", then may proceed with Cephalosporin use.     Past Medical History:  Diagnosis Date  . Atrial fibrillation ([place])   .  CHF (congestive heart failure) ([place])   . Coronary artery disease    Prior stent  . Degenerative joint disease   . Gout   . Hypercholesteremia   . Hypertension   . 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 Ischemic cardiomyopathy   . Renal insufficiency     Social History   Socioeconomic History  . Marital status: Divorced    Spouse name: Not on file  . Number of children: 4  . Years of education: Not on file  . Highest education level: Not on file  Occupational History  . Not on file  Social Needs  . Financial resource strain: Not on file  . Food insecurity:    Worry: Not on file    Inability: Not on file  . Transportation needs:    Medical: Not on  file    Non-medical: Not on file  Tobacco Use  . Smoking status: Current Some Day Smoker    Types: Cigars  . Smokeless tobacco: Never Used  Substance and Sexual Activity  . Alcohol use: Yes    Alcohol/week: 0.0 oz    Comment: Occasional  . Drug use: Not on file  . Sexual activity: Not on file  Lifestyle  . Physical activity:    Days per week: Not on file    Minutes per session: Not on file  . Stress: Not on file  Relationships  . Social connections:    Talks on phone: Not on file    Gets together: Not on file    Attends religious service: Not on file    Active member of club or organization: Not on file    Attends meetings of clubs or organizations: Not on file    Relationship status: Not on file  . Intimate partner violence:    Fear of current or ex partner: Not on file    Emotionally abused: Not on file    Physically abused: Not on file    Forced sexual activity: Not on file  Other Topics Concern  . Not on file  Social History Narrative  . Not on file     Family History  Problem Relation Age of Onset  . Heart disease Unknown        No family history  . Diabetes Sister      Review of Systems: General: negative for chills, fever, night sweats or weight changes.  Cardiovascular: negative for chest pain, dyspnea on exertion, edema, orthopnea, palpitations, paroxysmal nocturnal dyspnea or shortness of breath Dermatological: negative for rash Respiratory: negative for cough or wheezing Urologic: negative for hematuria Abdominal: negative for nausea, vomiting, diarrhea, bright red blood per rectum, melena, or hematemesis Neurologic: negative for visual changes, syncope, or dizziness All other systems reviewed and are otherwise negative except as noted above.    Blood pressure 126/64, pulse 72, height 5\' 8"  (1.727 m), weight 190 lb 3.2 oz (86.3 kg), SpO2 96 %.  General appearance: alert, cooperative and no distress Neck: no carotid bruit and no JVD Lungs: clear to  auscultation bilaterally Heart: regular rate and rhythm Abdomen: soft, non-tender; bowel sounds normal; no masses,  no organomegaly Extremities: extremities normal, atraumatic, no cyanosis or edema Pulses: 2+ and symmetric Skin: Skin color, texture, turgor normal. No rashes or lesions Neurologic: Grossly normal  EKG 01/04/18 ED- NSR, 1st AVB, PVCs  ASSESSMENT AND PLAN:   Chest pain with moderate risk of acute coronary syndrome Seen in ED after an episode of SSCP 01/04/18  CAD S/P percutaneous  coronary angioplasty PCI with stent 2012 in [place], cath "OK" 2014 prior to ICD in [place]  PAF (paroxysmal atrial fibrillation) [facility]) He has been maintaining NSR  Chronic anticoagulation He is on Eliquis 5 mg daily-renal dosing  ICD (implantable cardioverter-defibrillator) in place [facility] Jude ICD placed Feb 2014. He has high atrial lead resistance, plan is to replace this lead when pt is ERI- about 34 months.   CRI (chronic renal insufficiency), stage 4 (severe) (HCC) Followed by Dr Marval Regal  Ischemic cardiomyopathy Last EF 35-40% April 2017   [place]- f/u dr Stanford Breed in Nov if that is stable. I did provide him with an Rx for SL NTG.   Kerin Ransom PA-C 01/14/2018 3:15 PM

## 2018-01-14 NOTE — Assessment & Plan Note (Signed)
Seen in ED after an episode of SSCP 01/04/18

## 2018-01-14 NOTE — Assessment & Plan Note (Signed)
Followed by Dr Marval Regal

## 2018-01-14 NOTE — Assessment & Plan Note (Signed)
He is on Eliquis 5 mg daily-renal dosing

## 2018-01-14 NOTE — Assessment & Plan Note (Signed)
[employer] Jude ICD placed Feb 2014. He has high atrial lead resistance, plan is to replace this lead when pt is ERI- about 34 months.

## 2018-01-14 NOTE — Assessment & Plan Note (Signed)
Last EF 35-40% April 2017

## 2018-01-14 NOTE — Patient Instructions (Addendum)
Medication Instructions: Kerin Ransom, PA-C recommends that you continue on your current medications as directed. Please refer to the Current Medication list given to you today.  Labwork: NONE ORDERED  Testing/Procedures: 1. [place] Stress Test - Your physician has requested that you have a lexiscan myoview. For further information please visit [email or web address]. Please follow instruction sheet, as given.  The test will take approximately 3 to 4 hours to complete; you may bring reading material.  If someone comes with you to your appointment, they will need to remain in the main lobby due to limited space in the testing area. **If you are pregnant or breastfeeding, please notify the nuclear lab prior to your appointment**  You will need to hold the following medications prior to your stress test: n/a   How to prepare for your Myocardial Perfusion Test:  Do not eat or drink 3 hours prior to your test, except you may have water.  Do not consume products containing caffeine (regular or decaffeinated) 12 hours prior to your test. (ex: coffee, chocolate, sodas, tea).  Do wear comfortable clothes (no dresses or overalls) and walking shoes, tennis shoes preferred (No heels or open toe shoes are allowed).  Do NOT wear cologne, perfume, aftershave, or lotions (deodorant is allowed).  If these instructions are not followed, your test will have to be rescheduled.  Follow-up: Lurena Joiner recommends that you schedule a follow-up appointment in 4 months with Dr Stanford Breed.  If you need a refill on your cardiac medications before your next appointment, please call your pharmacy.  Nitroglycerin sublingual tablets What is this medicine? NITROGLYCERIN (nye troe GLI ser in) is a type of vasodilator. It relaxes blood vessels, increasing the blood and oxygen supply to your heart. This medicine is used to relieve chest pain caused by angina. It is also used to prevent chest pain before activities like  climbing stairs, going outdoors in cold weather, or sexual activity. This medicine may be used for other purposes; ask your health care provider or pharmacist if you have questions. COMMON BRAND NAME(S): Nitroquick, Nitrostat, Nitrotab What should I tell my health care provider before I take this medicine? They need to know if you have any of these conditions: -anemia -head injury, recent stroke, or bleeding in the brain -liver disease -previous heart attack -an unusual or allergic reaction to nitroglycerin, other medicines, foods, dyes, or preservatives -pregnant or trying to get pregnant -breast-feeding How should I use this medicine? Take this medicine by mouth as needed. At the first sign of an angina attack (chest pain or tightness) place one tablet under your tongue. You can also take this medicine 5 to 10 minutes before an event likely to produce chest pain. Follow the directions on the prescription label. Let the tablet dissolve under the tongue. Do not swallow whole. Replace the dose if you accidentally swallow it. It will help if your mouth is not dry. Saliva around the tablet will help it to dissolve more quickly. Do not eat or drink, smoke or chew tobacco while a tablet is dissolving. If you are not better within 5 minutes after taking ONE dose of nitroglycerin, call 9-1-1 immediately to seek emergency medical care. Do not take more than 3 nitroglycerin tablets over 15 minutes. If you take this medicine often to relieve symptoms of angina, your doctor or health care professional may provide you with different instructions to manage your symptoms. If symptoms do not go away after following these instructions, it is important to call 9-1-1 immediately. Do  not take more than 3 nitroglycerin tablets over 15 minutes. Talk to your pediatrician regarding the use of this medicine in children. Special care may be needed. Overdosage: If you think you have taken too much of this medicine contact a  poison control center or emergency room at once. NOTE: This medicine is only for you. Do not share this medicine with others. What if I miss a dose? This does not apply. This medicine is only used as needed. What may interact with this medicine? Do not take this medicine with any of the following medications: -certain migraine medicines like ergotamine and dihydroergotamine (DHE) -medicines used to treat erectile dysfunction like sildenafil, tadalafil, and vardenafil -riociguat This medicine may also interact with the following medications: -alteplase -aspirin -heparin -medicines for high blood pressure -medicines for mental depression -other medicines used to treat angina -phenothiazines like chlorpromazine, mesoridazine, prochlorperazine, thioridazine This list may not describe all possible interactions. Give your health care provider a list of all the medicines, herbs, non-prescription drugs, or dietary supplements you use. Also tell them if you smoke, drink alcohol, or use illegal drugs. Some items may interact with your medicine. What should I watch for while using this medicine? Tell your doctor or health care professional if you feel your medicine is no longer working. Keep this medicine with you at all times. Sit or lie down when you take your medicine to prevent falling if you feel dizzy or faint after using it. Try to remain calm. This will help you to feel better faster. If you feel dizzy, take several deep breaths and lie down with your feet propped up, or bend forward with your head resting between your knees. You may get drowsy or dizzy. Do not drive, use machinery, or do anything that needs mental alertness until you know how this drug affects you. Do not stand or sit up quickly, especially if you are an older patient. This reduces the risk of dizzy or fainting spells. Alcohol can make you more drowsy and dizzy. Avoid alcoholic drinks. Do not treat yourself for coughs, colds, or  pain while you are taking this medicine without asking your doctor or health care professional for advice. Some ingredients may increase your blood pressure. What side effects may I notice from receiving this medicine? Side effects that you should report to your doctor or health care professional as soon as possible: -blurred vision -dry mouth -skin rash -sweating -the feeling of extreme pressure in the head -unusually weak or tired Side effects that usually do not require medical attention (report to your doctor or health care professional if they continue or are bothersome): -flushing of the face or neck -headache -irregular heartbeat, palpitations -nausea, vomiting This list may not describe all possible side effects. Call your doctor for medical advice about side effects. You may report side effects to FDA at 1-800-FDA-1088. Where should I keep my medicine? Keep out of the reach of children. Store at room temperature between 20 and 25 degrees C (68 and 77 degrees F). Store in [facility]. Protect from light and moisture. Keep tightly closed. Throw away any unused medicine after the expiration date. NOTE: This sheet is a summary. It may not cover all possible information. If you have questions about this medicine, talk to your doctor, pharmacist, or health care provider.  2018 Elsevier/Gold Standard (2013-04-08 17:57:36)

## 2018-01-14 NOTE — Assessment & Plan Note (Signed)
PCI with stent 2012 in [place], cath "OK" 2014 prior to ICD in [place]

## 2018-01-14 NOTE — Assessment & Plan Note (Signed)
He has been maintaining NSR

## 2018-01-16 ENCOUNTER — Telehealth (HOSPITAL_COMMUNITY): Payer: Self-pay

## 2018-01-16 NOTE — Telephone Encounter (Signed)
Encounter complete. 

## 2018-01-21 ENCOUNTER — Ambulatory Visit (HOSPITAL_COMMUNITY)
Admission: RE | Admit: 2018-01-21 | Discharge: 2018-01-21 | Disposition: A | Discharge status: Home / Self Care | Payer: Medicare Other | Source: Ambulatory Visit | Attending: Cardiovascular Disease | Admitting: Cardiovascular Disease

## 2018-01-21 ENCOUNTER — Ambulatory Visit (INDEPENDENT_AMBULATORY_CARE_PROVIDER_SITE_OTHER): Payer: Medicare Other | Admitting: *Deleted

## 2018-01-21 ENCOUNTER — Encounter: Payer: Self-pay | Admitting: Cardiology

## 2018-01-21 DIAGNOSIS — I255 Ischemic cardiomyopathy: Secondary | ICD-10-CM | POA: Diagnosis not present

## 2018-01-21 DIAGNOSIS — R072 Precordial pain: Secondary | ICD-10-CM | POA: Insufficient documentation

## 2018-01-21 LAB — MYOCARDIAL PERFUSION IMAGING
LV dias vol: 124 mL (ref 62–150)
LV sys vol: 75 mL
Peak HR: 68 {beats}/min
Rest HR: 51 {beats}/min
SDS: 4
SRS: 4
SSS: 8
TID: 1.24

## 2018-01-21 MED ORDER — REGADENOSON 0.4 MG/5ML IV SOLN
0.4000 mg | Freq: Once | INTRAVENOUS | Status: AC
  Administered 2018-01-21: 0.4 mg via INTRAVENOUS

## 2018-01-21 MED ORDER — TECHNETIUM TC 99M TETROFOSMIN IV KIT
30.5000 | PACK | Freq: Once | INTRAVENOUS | Status: AC | PRN
  Administered 2018-01-21: 30.5 via INTRAVENOUS
  Filled 2018-01-21: qty 31

## 2018-01-21 MED ORDER — TECHNETIUM TC 99M TETROFOSMIN IV KIT
10.1000 | PACK | Freq: Once | INTRAVENOUS | Status: AC | PRN
  Administered 2018-01-21: 10.1 via INTRAVENOUS
  Filled 2018-01-21: qty 11

## 2018-01-21 NOTE — Progress Notes (Signed)
Remote ICD transmission.   

## 2018-01-22 ENCOUNTER — Other Ambulatory Visit: Payer: Self-pay

## 2018-01-22 MED ORDER — ISOSORBIDE MONONITRATE ER 60 MG PO TB24: 60.0000 mg | ORAL_TABLET | Freq: Every day | ORAL | 4 refills | Status: DC

## 2018-02-16 DIAGNOSIS — R972 Elevated prostate specific antigen [PSA]: Secondary | ICD-10-CM | POA: Diagnosis not present

## 2018-02-17 LAB — CUP PACEART REMOTE DEVICE CHECK
Battery Remaining Percentage: 42 %
Battery Voltage: 2.92 V
Brady Statistic AP VS Percent: 1.9 %
Brady Statistic AS VP Percent: 22 %
Brady Statistic RA Percent Paced: 1.3 %
Date Time Interrogation Session: 20190731080016
HIGH POWER IMPEDANCE MEASURED VALUE: 44 Ohm
HighPow Impedance: 44 Ohm
Implantable Lead Implant Date: 20140206
Implantable Lead Location: 753859
Implantable Lead Location: 753860
Implantable Pulse Generator Implant Date: 20170206
Lead Channel Impedance Value: 360 Ohm
Lead Channel Pacing Threshold Pulse Width: 1 ms
Lead Channel Sensing Intrinsic Amplitude: 11.5 mV
Lead Channel Sensing Intrinsic Amplitude: 2.1 mV
MDC IDC LEAD IMPLANT DT: 20140206
MDC IDC MSMT BATTERY REMAINING LONGEVITY: 34 mo
MDC IDC MSMT LEADCHNL RA PACING THRESHOLD AMPLITUDE: 3.5 V
MDC IDC MSMT LEADCHNL RV IMPEDANCE VALUE: 410 Ohm
MDC IDC MSMT LEADCHNL RV PACING THRESHOLD AMPLITUDE: 0.875 V
MDC IDC MSMT LEADCHNL RV PACING THRESHOLD PULSEWIDTH: 0.5 ms
MDC IDC SET LEADCHNL RA PACING AMPLITUDE: 5 V
MDC IDC SET LEADCHNL RV PACING AMPLITUDE: 1.125
MDC IDC SET LEADCHNL RV PACING PULSEWIDTH: 0.5 ms
MDC IDC SET LEADCHNL RV SENSING SENSITIVITY: 0.5 mV
MDC IDC STAT BRADY AP VP PERCENT: 1 %
MDC IDC STAT BRADY AS VS PERCENT: 74 %
MDC IDC STAT BRADY RV PERCENT PACED: 23 %
Pulse Gen Serial Number: 7072725

## 2018-02-20 DIAGNOSIS — N400 Enlarged prostate without lower urinary tract symptoms: Secondary | ICD-10-CM | POA: Diagnosis not present

## 2018-02-20 DIAGNOSIS — R972 Elevated prostate specific antigen [PSA]: Secondary | ICD-10-CM | POA: Diagnosis not present

## 2018-03-08 ENCOUNTER — Emergency Department (HOSPITAL_COMMUNITY): Payer: Medicare Other

## 2018-03-08 ENCOUNTER — Other Ambulatory Visit: Payer: Self-pay

## 2018-03-08 ENCOUNTER — Encounter (HOSPITAL_COMMUNITY): Payer: Self-pay | Admitting: *Deleted

## 2018-03-08 ENCOUNTER — Emergency Department (HOSPITAL_COMMUNITY)
Admission: EM | Admit: 2018-03-08 | Discharge: 2018-03-08 | Disposition: A | Discharge status: Home / Self Care | Payer: Medicare Other | Attending: Emergency Medicine | Admitting: Emergency Medicine

## 2018-03-08 DIAGNOSIS — W19XXXA Unspecified fall, initial encounter: Secondary | ICD-10-CM

## 2018-03-08 DIAGNOSIS — F17210 Nicotine dependence, cigarettes, uncomplicated: Secondary | ICD-10-CM | POA: Diagnosis not present

## 2018-03-08 DIAGNOSIS — I959 Hypotension, unspecified: Secondary | ICD-10-CM

## 2018-03-08 DIAGNOSIS — I509 Heart failure, unspecified: Secondary | ICD-10-CM | POA: Diagnosis not present

## 2018-03-08 DIAGNOSIS — R55 Syncope and collapse: Secondary | ICD-10-CM | POA: Diagnosis not present

## 2018-03-08 DIAGNOSIS — I110 Hypertensive heart disease with heart failure: Secondary | ICD-10-CM | POA: Diagnosis not present

## 2018-03-08 DIAGNOSIS — Z79899 Other long term (current) drug therapy: Secondary | ICD-10-CM | POA: Insufficient documentation

## 2018-03-08 DIAGNOSIS — R569 Unspecified convulsions: Secondary | ICD-10-CM | POA: Diagnosis not present

## 2018-03-08 DIAGNOSIS — M1611 Unilateral primary osteoarthritis, right hip: Secondary | ICD-10-CM | POA: Diagnosis not present

## 2018-03-08 LAB — I-STAT TROPONIN, ED: TROPONIN I, POC: 0 ng/mL (ref 0.00–0.08)

## 2018-03-08 LAB — I-STAT CHEM 8, ED
BUN: 47 mg/dL — ABNORMAL HIGH (ref 8–23)
CREATININE: 3.7 mg/dL — AB (ref 0.61–1.24)
Calcium, Ion: 1.33 mmol/L (ref 1.15–1.40)
Chloride: 109 mmol/L (ref 98–111)
Glucose, Bld: 97 mg/dL (ref 70–99)
HEMATOCRIT: 42 % (ref 39.0–52.0)
HEMOGLOBIN: 14.3 g/dL (ref 13.0–17.0)
POTASSIUM: 4.7 mmol/L (ref 3.5–5.1)
Sodium: 141 mmol/L (ref 135–145)
TCO2: 24 mmol/L (ref 22–32)

## 2018-03-08 LAB — CBC WITH DIFFERENTIAL/PLATELET
Abs Immature Granulocytes: 0.1 10*3/uL (ref 0.0–0.1)
Basophils Absolute: 0 10*3/uL (ref 0.0–0.1)
Basophils Relative: 0 %
EOS ABS: 0.2 10*3/uL (ref 0.0–0.7)
Eosinophils Relative: 1 %
HCT: 41.4 % (ref 39.0–52.0)
Hemoglobin: 12.6 g/dL — ABNORMAL LOW (ref 13.0–17.0)
IMMATURE GRANULOCYTES: 1 %
LYMPHS PCT: 17 %
Lymphs Abs: 2 10*3/uL (ref 0.7–4.0)
MCH: 27.1 pg (ref 26.0–34.0)
MCHC: 30.4 g/dL (ref 30.0–36.0)
MCV: 89 fL (ref 78.0–100.0)
MONO ABS: 1 10*3/uL (ref 0.1–1.0)
Monocytes Relative: 9 %
NEUTROS ABS: 8.2 10*3/uL — AB (ref 1.7–7.7)
NEUTROS PCT: 72 %
Platelets: 190 10*3/uL (ref 150–400)
RBC: 4.65 MIL/uL (ref 4.22–5.81)
RDW: 15.9 % — AB (ref 11.5–15.5)
WBC: 11.4 10*3/uL — ABNORMAL HIGH (ref 4.0–10.5)

## 2018-03-08 LAB — PROTIME-INR
INR: 1.26
Prothrombin Time: 15.6 seconds — ABNORMAL HIGH (ref 11.4–15.2)

## 2018-03-08 LAB — BASIC METABOLIC PANEL
ANION GAP: 9 (ref 5–15)
BUN: 47 mg/dL — ABNORMAL HIGH (ref 8–23)
CALCIUM: 9.9 mg/dL (ref 8.9–10.3)
CO2: 24 mmol/L (ref 22–32)
Chloride: 108 mmol/L (ref 98–111)
Creatinine, Ser: 3.43 mg/dL — ABNORMAL HIGH (ref 0.61–1.24)
GFR calc non Af Amer: 16 mL/min — ABNORMAL LOW (ref >60)
GFR, EST AFRICAN AMERICAN: 18 mL/min — AB (ref >60)
Glucose, Bld: 103 mg/dL — ABNORMAL HIGH (ref 70–99)
POTASSIUM: 4.6 mmol/L (ref 3.5–5.1)
Sodium: 141 mmol/L (ref 135–145)

## 2018-03-08 MED ORDER — SODIUM CHLORIDE 0.9 % IV BOLUS
1000.0000 mL | Freq: Once | INTRAVENOUS | Status: AC
  Administered 2018-03-08: 1000 mL via INTRAVENOUS

## 2018-03-08 MED ORDER — ACETAMINOPHEN 325 MG PO TABS
650.0000 mg | ORAL_TABLET | Freq: Once | ORAL | Status: AC
  Administered 2018-03-08: 650 mg via ORAL
  Filled 2018-03-08: qty 2

## 2018-03-08 NOTE — ED Notes (Signed)
Pt given [place] sandwich and apple juice at this time.

## 2018-03-08 NOTE — ED Triage Notes (Addendum)
PT reports he was sitting  In a chair today watching his Anthony Foster work on a car. Pt reports when he stood uo up He did not feel god and sat down . Pt reports he did not fall or hit his head. Ems reported the grand son reported the Pt had a seizure from the waist up while sitting in the chair. EMS reported Pt was not post ictal on their arrival . Pt did reports he has a ICD.

## 2018-03-08 NOTE — ED Provider Notes (Signed)
[facility] EMERGENCY DEPARTMENT Provider Note   CSN: [id number] Arrival date & time: 03/08/18  1318     History   Chief Complaint Chief Complaint  Patient presents with  . Loss of Consciousness    HPI Anthony Foster is a 77 y.o. male BIB EMS for LOC.   Patient complains of lightheadedness and syncope. Onset was 2 hours ago, with resolved course since that time. Patient was moving from sitting to standing position at time of onset. The duration of the episode few seconds. Patient denies vertigo, Vomiting, Chest pain, Weakness, back pain, abdominal pain and melena. No aggravating or alleviating sxs. Associated symptoms include diaphoresis. The patient denies confusion, fever, nasal congestion, headache, chest pain, palpitations, vertigo, dizziness, blurred vision, diplopia, loss of vision, slurred speech, focal weakness, focal sensory loss, clumsiness, nausea, vomiting, neck pain and incontinence.     Past Medical History:  Diagnosis Date  . Atrial fibrillation ([place])   . CHF (congestive heart failure) ([place])   . Coronary artery disease    Prior stent  . Degenerative joint disease   . Gout   . Hypercholesteremia   . Hypertension   . 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 Ischemic cardiomyopathy   . Renal insufficiency     Patient Active Problem List   Diagnosis Date Noted  . Chest pain with moderate risk of acute coronary syndrome 01/14/2018  . Chronic anticoagulation 01/14/2018  . CRI (chronic renal insufficiency), stage 4 (severe) ([place]) 01/14/2018  . Ischemic cardiomyopathy 01/14/2018  . CAD S/P percutaneous coronary angioplasty 09/12/2015  . Renal insufficiency 09/12/2015  . Essential hypertension 09/12/2015  . Hyperlipidemia 09/12/2015  . CHF (congestive heart failure) ([place]) 07/07/2015  . Right knee pain 07/07/2015  . Pedal edema 07/07/2015  . Fluid overload 07/07/2015  . PAF (paroxysmal atrial fibrillation)  ([place]) 07/07/2015  . Gout 07/07/2015  . ICD (implantable cardioverter-defibrillator) in place 07/07/2015    Past Surgical History:  Procedure Laterality Date  . BACK SURGERY    . CARDIAC SURGERY    . PACEMAKER PLACEMENT    . TOTAL SHOULDER REPLACEMENT    . WRIST SURGERY          Home Medications    Prior to Admission medications   Medication Sig Start Date End Date Taking? Authorizing Provider  allopurinol (ZYLOPRIM) 300 MG tablet Take 300 mg by mouth daily. 07/26/16  Yes [provider]  apixaban (ELIQUIS) 5 MG TABS tablet Take 5 mg by mouth daily.   Yes [provider]  atorvastatin (LIPITOR) 80 MG tablet Take 1 tablet (80 mg total) by mouth daily. 09/12/15  Yes Lelon Perla, MD  carvedilol (COREG) 25 MG tablet Take 12.5 mg by mouth daily.  09/21/16  Yes [provider]  furosemide (LASIX) 40 MG tablet Take 40 mg by mouth daily. 07/21/16  Yes [provider]  hydrALAZINE (APRESOLINE) 25 MG tablet Take 1 tablet (25 mg total) by mouth 3 (three) times daily. Patient taking differently: Take 25 mg by mouth daily.  09/12/15  Yes Lelon Perla, MD  isosorbide mononitrate (IMDUR) 60 MG 24 hr tablet Take 1 tablet (60 mg total) by mouth daily. 01/22/18  Yes Kilroy, Luke K, PA-C  nitroGLYCERIN (NITROSTAT) 0.4 MG SL tablet Place 1 tablet (0.4 mg total) under the tongue every 5 (five) minutes as needed for chest pain. 01/14/18 04/14/18 Yes Kilroy, Luke K, PA-C  potassium chloride SA (K-DUR,KLOR-CON) 20 MEQ tablet Take 10 mEq by mouth daily.  09/23/16  Yes [provider]    Family History Family History  Problem Relation Age of Onset  . Heart disease Unknown        No family history  . Diabetes Sister     Social History Social History   Tobacco Use  . Smoking status: Current Some Day Smoker    Types: Cigars  . Smokeless tobacco: Never Used  Substance Use Topics  . Alcohol use: Yes    Alcohol/week: 0.0 standard drinks    Comment:  Occasional  . Drug use: Not on file     Allergies   Hymenoptera venom preparations; Lisinopril; Piroxicam; and Amoxicillin-pot clavulanate   Review of Systems Review of Systems  Ten systems reviewed and are negative for acute change, except as noted in the HPI.   Physical Exam Updated Vital Signs BP (!) 96/49 (BP Location: Right Arm)   Pulse 65   Temp 97.9 F (36.6 C) (Oral)   Resp 12   Ht 5\' 8"  (1.727 m)   Wt 83.9 kg   SpO2 98%   BMI 28.12 kg/m   Physical Exam  Constitutional: He is oriented to person, place, and time. He appears well-developed and well-nourished. No distress.  HENT:  Head: Normocephalic and atraumatic.  Eyes: Pupils are equal, round, and reactive to light. Conjunctivae and EOM are normal. No scleral icterus.  Neck: Normal range of motion. Neck supple. No JVD present.  Cardiovascular: Normal rate, regular rhythm and normal heart sounds.  Pulmonary/Chest: Effort normal and breath sounds normal. No respiratory distress.  Abdominal: Soft. He exhibits no distension. There is no tenderness.  Musculoskeletal: He exhibits no edema or tenderness.  Neurological: He is alert and oriented to person, place, and time.  Skin: Skin is warm and dry. He is not diaphoretic.  Psychiatric: His behavior is normal.  Nursing note and vitals reviewed.    ED Treatments / Results  Labs (all labs ordered are listed, but only abnormal results are displayed) Labs Reviewed  CBC WITH DIFFERENTIAL/PLATELET - Abnormal; Notable for the following components:      Result Value   WBC 11.4 (*)    Hemoglobin 12.6 (*)    RDW 15.9 (*)    Neutro Abs 8.2 (*)    All other components within normal limits  PROTIME-INR - Abnormal; Notable for the following components:   Prothrombin Time 15.6 (*)    All other components within normal limits  I-STAT CHEM 8, ED - Abnormal; Notable for the following components:   BUN 47 (*)    Creatinine, Ser 3.70 (*)    All other components within normal  limits  BASIC METABOLIC PANEL  I-STAT TROPONIN, ED  CBG MONITORING, ED    EKG EKG Interpretation  Date/Time:  Sunday March 08 2018 13:19:23 EDT Ventricular Rate:  67 PR Interval:    QRS Duration: 98 QT Interval:  415 QTC Calculation: 439 R Axis:   -57 Text Interpretation:  Sinus rhythm Prolonged PR interval Left anterior fascicular block Nonspecific T abnormalities, lateral leads No significant change since last tracing Confirmed by Lacretia Leigh ([id number]) on 03/08/2018 3:35:21 PM   Radiology No results found.  Procedures Procedures (including critical care time)  Medications Ordered in ED Medications  sodium chloride 0.9 % bolus 1,000 mL (has no administration in time range)     Initial Impression / Assessment and Plan / ED Course  I have reviewed the triage vital signs and the nursing notes.  Pertinent labs & imaging results that were  available during my care of the patient were reviewed by me and considered in my medical decision making (see chart for details).  Clinical Course as of Mar 08 1654  Sun Mar 08, 2018  1654 Hgb is at baseline.   [AH]    Clinical Course User Index [AH] Margarita Mail, PA-C    77 year old male who presents the emergency department with chief complaint of syncope.  Patient has a long-standing history of cardiac disease and has an implantable cardiac defibrillator.  Patient states that today he was feeling a little lightheaded while he was at the gas station.  He went home and sat down in a chair.  His grandson was present and helping to change the oil in his car.  He says while he was sitting there he felt lightheaded and when he stood up he felt extremely lightheaded, sat back down and then promptly lost consciousness.  His grandson apparently caught him and brought him down to the ground.  He had associated diaphoresis but denies chest pain, recent volume loss, melena, hematochezia.  He is on anticoagulation.  He states that 1 of his  medications was just changed to 60 mg but he does not know which one.  The patient was found to have positive orthostatic vital signs.  He denies history of seizures.  EMS reports that the patient was alert and oriented and had no postictal signs.  He has no history of previous episodes of syncope  Final Clinical Impressions(s) / ED Diagnoses   Final diagnoses:  Fall   4:55 PM BP (!) 96/49 (BP Location: Right Arm)   Pulse 65   Temp 97.9 F (36.6 C) (Oral)   Resp 12   Ht 5\' 8"  (1.727 m)   Wt 83.9 kg   SpO2 98%   BMI 28.12 kg/m  Seen here with positive orthostatic vital signs, episode of syncope.  I have ordered ICD interrogation.  His hemoglobin is at baseline and he has therapeutic INR.  Currently awaiting his chemistry panel.  He is received fluids. The differential for syncope is extensive and includes, but is not limited to: arrythmia (Vtach, SVT, SSS, sinus arrest, AV block, bradycardia) aortic stenosis, AMI, HOCM, PE, atrial myxoma, pulmonary hypertension, orthostatic hypotension, (hypovolemia, drug effect, GB syndrome, micturition, cough, swall) carotid sinus sensitivity, Seizure, TIA/CVA, hypoglycemia,  Vertigo. Suspect this may be related to to the change of his medications. 02/22/2018 his M door was changed from 30 mg to 60 mg.  Will reevaluate.  I have given sign out to [facility] who will assume care of the patient for appropriate disposition.  His EKG is not concerning for ischemia or arrhythmia.  He has a normal rate and it is unchanged from previous. ED Discharge Orders    None       Margarita Mail, PA-C 03/08/18 1656    Lacretia Leigh, MD 03/09/18 1327

## 2018-03-08 NOTE — Discharge Instructions (Signed)
Your symptoms have have been caused by the recent increase in your medications.   Call your cardiologist tomorrow to discuss medications.

## 2018-03-08 NOTE — ED Notes (Signed)
Patient transported to X-ray 

## 2018-03-08 NOTE — ED Notes (Signed)
PT states understanding of care given, follow up care, and medication prescribed. PT ambulated from ED to car with a steady gait. 

## 2018-03-08 NOTE — ED Provider Notes (Signed)
Pt's care assumed at 4:30pm  Pt receiving Iv fluids.   Repeat orthostaics improved but still has decrease with standing.  Pt given 2nd liter Iv fluids.  Pt advised to call his cardiologist tomorrow to discuss medications.  Pt feels much better.    Anthony Foster 03/08/18 2043    Lajean Saver, MD 03/08/18 2225

## 2018-03-25 DIAGNOSIS — Z6827 Body mass index (BMI) 27.0-27.9, adult: Secondary | ICD-10-CM | POA: Diagnosis not present

## 2018-03-25 DIAGNOSIS — N2581 Secondary hyperparathyroidism of renal origin: Secondary | ICD-10-CM | POA: Diagnosis not present

## 2018-03-25 DIAGNOSIS — D631 Anemia in chronic kidney disease: Secondary | ICD-10-CM | POA: Diagnosis not present

## 2018-03-25 DIAGNOSIS — N184 Chronic kidney disease, stage 4 (severe): Secondary | ICD-10-CM | POA: Diagnosis not present

## 2018-03-25 DIAGNOSIS — I129 Hypertensive chronic kidney disease with stage 1 through stage 4 chronic kidney disease, or unspecified chronic kidney disease: Secondary | ICD-10-CM | POA: Diagnosis not present

## 2018-03-25 DIAGNOSIS — Z720 Tobacco use: Secondary | ICD-10-CM | POA: Diagnosis not present

## 2018-04-01 ENCOUNTER — Telehealth: Payer: Self-pay | Admitting: Cardiology

## 2018-04-01 NOTE — Telephone Encounter (Signed)
Received Records from [facility] on 04/01/18, Appt 05/18/18 @ 9:20AM. [place]

## 2018-04-22 ENCOUNTER — Ambulatory Visit (INDEPENDENT_AMBULATORY_CARE_PROVIDER_SITE_OTHER): Payer: Medicare Other | Admitting: *Deleted

## 2018-04-22 DIAGNOSIS — I5022 Chronic systolic (congestive) heart failure: Secondary | ICD-10-CM

## 2018-04-22 DIAGNOSIS — I255 Ischemic cardiomyopathy: Secondary | ICD-10-CM

## 2018-04-22 NOTE — Progress Notes (Signed)
Remote ICD transmission.   

## 2018-04-27 ENCOUNTER — Encounter: Payer: Self-pay | Admitting: Cardiology

## 2018-04-29 DIAGNOSIS — M109 Gout, unspecified: Secondary | ICD-10-CM | POA: Diagnosis not present

## 2018-04-29 DIAGNOSIS — Z95810 Presence of automatic (implantable) cardiac defibrillator: Secondary | ICD-10-CM | POA: Diagnosis not present

## 2018-04-29 DIAGNOSIS — Z23 Encounter for immunization: Secondary | ICD-10-CM | POA: Diagnosis not present

## 2018-04-29 DIAGNOSIS — I10 Essential (primary) hypertension: Secondary | ICD-10-CM | POA: Diagnosis not present

## 2018-04-29 DIAGNOSIS — N184 Chronic kidney disease, stage 4 (severe): Secondary | ICD-10-CM | POA: Diagnosis not present

## 2018-04-29 DIAGNOSIS — R7303 Prediabetes: Secondary | ICD-10-CM | POA: Diagnosis not present

## 2018-04-29 DIAGNOSIS — J449 Chronic obstructive pulmonary disease, unspecified: Secondary | ICD-10-CM | POA: Diagnosis not present

## 2018-04-29 DIAGNOSIS — I4891 Unspecified atrial fibrillation: Secondary | ICD-10-CM | POA: Diagnosis not present

## 2018-04-29 DIAGNOSIS — I509 Heart failure, unspecified: Secondary | ICD-10-CM | POA: Diagnosis not present

## 2018-04-29 DIAGNOSIS — E7800 Pure hypercholesterolemia, unspecified: Secondary | ICD-10-CM | POA: Diagnosis not present

## 2018-05-01 ENCOUNTER — Encounter: Payer: Self-pay | Admitting: Cardiology

## 2018-05-11 NOTE — Progress Notes (Signed)
HPI: FU CAD and congestive heart failure. Previously cared for in [facility]. Patient was admitted in January 2017 with congestive heart failure. It was noted he had a history of atrial fibrillation, coronary artery disease, ischemic cardiomyopathy with ejection fraction 35-40%. Laboratories shows he has a creatinine of 3.30. Echocardiogram April 2017 showed ejection fraction 75-10%, grade 2 diastolic dysfunction, moderate left atrial enlargement and mild tricuspid regurgitation. Abdominal ultrasound November 2018 showed 3.4 cm abdominal aortic aneurysm and dilated left and right common iliacs.  Nuclear study July 2019 showed ejection fraction 40%, inferior scar versus attenuation and increased pulmonary uptake.  Patient seen with near syncope September 2019.  Felt to be orthostatic mediated.  Since last seen, the patient denies any dyspnea on exertion, orthopnea, PND, pedal edema, palpitations, syncope or chest pain.   Current Outpatient Medications  Medication Sig Dispense Refill  . allopurinol (ZYLOPRIM) 300 MG tablet Take 300 mg by mouth daily.  6  . apixaban (ELIQUIS) 5 MG TABS tablet Take 5 mg by mouth daily.    Marland Kitchen atorvastatin (LIPITOR) 80 MG tablet Take 1 tablet (80 mg total) by mouth daily. 30 tablet 6  . carvedilol (COREG) 25 MG tablet Take 12.5 mg by mouth daily.   1  . furosemide (LASIX) 40 MG tablet Take 40 mg by mouth daily.  3  . hydrALAZINE (APRESOLINE) 25 MG tablet Take 1 tablet (25 mg total) by mouth 3 (three) times daily. (Patient taking differently: Take 25 mg by mouth daily. ) 270 tablet 3  . isosorbide mononitrate (IMDUR) 60 MG 24 hr tablet Take 1 tablet (60 mg total) by mouth daily. 30 tablet 4  . potassium chloride SA (K-DUR,KLOR-CON) 20 MEQ tablet Take 10 mEq by mouth daily.   6  . nitroGLYCERIN (NITROSTAT) 0.4 MG SL tablet Place 1 tablet (0.4 mg total) under the tongue every 5 (five) minutes as needed for chest pain. 25 tablet 3   No current  facility-administered medications for this visit.      Past Medical History:  Diagnosis Date  . Atrial fibrillation ([place])   . CHF (congestive heart failure) ([address])   . Coronary artery disease    Prior stent  . Degenerative joint disease   . Gout   . Hypercholesteremia   . Hypertension   . 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 Ischemic cardiomyopathy   . Renal insufficiency     Past Surgical History:  Procedure Laterality Date  . BACK SURGERY    . CARDIAC SURGERY    . PACEMAKER PLACEMENT    . TOTAL SHOULDER REPLACEMENT    . WRIST SURGERY      Social History   Socioeconomic History  . Marital status: Divorced    Spouse name: Not on file  . Number of children: 4  . Years of education: Not on file  . Highest education level: Not on file  Occupational History  . Not on file  Social Needs  . Financial resource strain: Not on file  . Food insecurity:    Worry: Not on file    Inability: Not on file  . Transportation needs:    Medical: Not on file    Non-medical: Not on file  Tobacco Use  . Smoking status: Current Some Day Smoker    Types: Cigars  . Smokeless tobacco: Never Used  Substance and Sexual Activity  . Alcohol use: Yes    Alcohol/week: 0.0 standard drinks    Comment: Occasional  .  Drug use: Never  . Sexual activity: Not on file  Lifestyle  . Physical activity:    Days per week: Not on file    Minutes per session: Not on file  . Stress: Not on file  Relationships  . Social connections:    Talks on phone: Not on file    Gets together: Not on file    Attends religious service: Not on file    Active member of club or organization: Not on file    Attends meetings of clubs or organizations: Not on file    Relationship status: Not on file  . Intimate partner violence:    Fear of current or ex partner: Not on file    Emotionally abused: Not on file    Physically abused: Not on file    Forced sexual activity: Not on file    Other Topics Concern  . Not on file  Social History Narrative  . Not on file    Family History  Problem Relation Age of Onset  . Heart disease Unknown        No family history  . Diabetes Sister     ROS: no fevers or chills, productive cough, hemoptysis, dysphasia, odynophagia, melena, hematochezia, dysuria, hematuria, rash, seizure activity, orthopnea, PND, pedal edema, claudication. Remaining systems are negative.  Physical Exam: Well-developed well-nourished in no acute distress.  Skin is warm and dry.  HEENT is normal.  Neck is supple.  Chest is clear to auscultation with normal expansion.  Cardiovascular exam is regular rate and rhythm.  Abdominal exam nontender or distended. No masses palpated. Extremities show no edema. neuro grossly intact   A/P  1 ischemic cardiomyopathy-plan to continue medical therapy with hydralazine/nitrates and beta-blocker.  He is not on Entresto or ARB because of baseline renal insufficiency.  2 coronary artery disease-patient denies chest pain.  Continue medical therapy.  Continue statin.  No aspirin given need for anticoagulation.  3 paroxysmal atrial fibrillation-patient remains in sinus rhythm on exam.  Continue beta-blocker for rate control if atrial fibrillation recurs.  Continue apixaban.  4 history of abdominal aortic aneurysm-schedule follow-up abdominal ultrasound.  5 prior ICD-patient is followed by electrophysiology.  6 hyperlipidemia-continue statin.  Lipids and liver monitored by primary care.  7 hypertension-patient's blood pressure is controlled.  Continue present medications and follow.  8 renal insufficiency-followed by nephrology.  9 tobacco abuse-patient counseled on discontinuing.  Kirk Ruths, MD

## 2018-05-18 ENCOUNTER — Ambulatory Visit (HOSPITAL_COMMUNITY)
Admission: RE | Admit: 2018-05-18 | Discharge: 2018-05-18 | Disposition: A | Discharge status: Home / Self Care | Payer: Medicare Other | Source: Ambulatory Visit | Attending: Cardiovascular Disease | Admitting: Cardiovascular Disease

## 2018-05-18 ENCOUNTER — Ambulatory Visit (INDEPENDENT_AMBULATORY_CARE_PROVIDER_SITE_OTHER): Payer: Medicare Other | Admitting: Cardiology

## 2018-05-18 ENCOUNTER — Other Ambulatory Visit: Payer: Self-pay | Admitting: *Deleted

## 2018-05-18 ENCOUNTER — Encounter: Payer: Self-pay | Admitting: Cardiology

## 2018-05-18 VITALS — BP 134/66 | HR 65 | Ht 68.0 in | Wt 190.0 lb

## 2018-05-18 DIAGNOSIS — I714 Abdominal aortic aneurysm, without rupture, unspecified: Secondary | ICD-10-CM

## 2018-05-18 DIAGNOSIS — I2510 Atherosclerotic heart disease of native coronary artery without angina pectoris: Secondary | ICD-10-CM

## 2018-05-18 DIAGNOSIS — I255 Ischemic cardiomyopathy: Secondary | ICD-10-CM | POA: Diagnosis not present

## 2018-05-18 DIAGNOSIS — I480 Paroxysmal atrial fibrillation: Secondary | ICD-10-CM

## 2018-05-18 NOTE — Patient Instructions (Signed)
Medication Instructions:  NO CHANGE If you need a refill on your cardiac medications before your next appointment, please call your pharmacy.   Lab work: If you have labs (blood work) drawn today and your tests are completely normal, you will receive your results only by: Marland Kitchen MyChart Message (if you have MyChart) OR . A paper copy in the mail If you have any lab test that is abnormal or we need to change your treatment, we will call you to review the results.  Follow-Up: At [facility], you and your health needs are our priority.  As part of our continuing mission to provide you with exceptional heart care, we have created designated Provider Care Teams.  These Care Teams include your primary Cardiologist (physician) and Advanced Practice Providers (APPs -  Physician Assistants and Nurse Practitioners) who all work together to provide you with the care you need, when you need it. You will need a follow up appointment in 12 months.  Please call our office 2 months in advance to schedule this appointment.  You may see Kirk Ruths, MD or one of the following Advanced Practice Providers on your designated Care Team:   Kerin Ransom, PA-C Roby Lofts, [place] . Sande Rives, PA-C  SCHEDULE FOLLOW UP APPOINTMENT WITH DR Car[place]ale

## 2018-06-19 DIAGNOSIS — H40013 Open angle with borderline findings, low risk, bilateral: Secondary | ICD-10-CM | POA: Diagnosis not present

## 2018-06-23 LAB — CUP PACEART REMOTE DEVICE CHECK
Battery Remaining Longevity: 32 mo
Battery Remaining Percentage: 40 %
Battery Voltage: 2.92 V
Brady Statistic AP VP Percent: 1 %
Brady Statistic AP VS Percent: 2 %
Brady Statistic RA Percent Paced: 1.4 %
Brady Statistic RV Percent Paced: 24 %
HIGH POWER IMPEDANCE MEASURED VALUE: 45 Ohm
HighPow Impedance: 46 Ohm
Implantable Lead Implant Date: 20140206
Implantable Lead Location: 753859
Implantable Pulse Generator Implant Date: 20170206
Lead Channel Impedance Value: 430 Ohm
Lead Channel Pacing Threshold Amplitude: 0.875 V
Lead Channel Pacing Threshold Amplitude: 3.5 V
Lead Channel Pacing Threshold Pulse Width: 0.5 ms
Lead Channel Sensing Intrinsic Amplitude: 1.6 mV
Lead Channel Sensing Intrinsic Amplitude: 11.5 mV
Lead Channel Setting Pacing Amplitude: 1.125
Lead Channel Setting Pacing Amplitude: 5 V
Lead Channel Setting Pacing Pulse Width: 0.5 ms
MDC IDC LEAD IMPLANT DT: 20140206
MDC IDC LEAD LOCATION: 753860
MDC IDC MSMT LEADCHNL RA IMPEDANCE VALUE: 360 Ohm
MDC IDC MSMT LEADCHNL RA PACING THRESHOLD PULSEWIDTH: 1 ms
MDC IDC PG SERIAL: 7072725
MDC IDC SESS DTM: 20191030080021
MDC IDC SET LEADCHNL RV SENSING SENSITIVITY: 0.5 mV
MDC IDC STAT BRADY AS VP PERCENT: 23 %
MDC IDC STAT BRADY AS VS PERCENT: 72 %

## 2018-07-01 DIAGNOSIS — D631 Anemia in chronic kidney disease: Secondary | ICD-10-CM | POA: Diagnosis not present

## 2018-07-01 DIAGNOSIS — Z720 Tobacco use: Secondary | ICD-10-CM | POA: Diagnosis not present

## 2018-07-01 DIAGNOSIS — N184 Chronic kidney disease, stage 4 (severe): Secondary | ICD-10-CM | POA: Diagnosis not present

## 2018-07-01 DIAGNOSIS — N2581 Secondary hyperparathyroidism of renal origin: Secondary | ICD-10-CM | POA: Diagnosis not present

## 2018-07-01 DIAGNOSIS — Z6827 Body mass index (BMI) 27.0-27.9, adult: Secondary | ICD-10-CM | POA: Diagnosis not present

## 2018-07-01 DIAGNOSIS — I129 Hypertensive chronic kidney disease with stage 1 through stage 4 chronic kidney disease, or unspecified chronic kidney disease: Secondary | ICD-10-CM | POA: Diagnosis not present

## 2018-07-15 ENCOUNTER — Ambulatory Visit (INDEPENDENT_AMBULATORY_CARE_PROVIDER_SITE_OTHER): Payer: Medicare Other | Admitting: Internal Medicine

## 2018-07-15 ENCOUNTER — Encounter: Payer: Self-pay | Admitting: Internal Medicine

## 2018-07-15 VITALS — BP 112/66 | HR 107 | Ht 68.0 in | Wt 194.0 lb

## 2018-07-15 DIAGNOSIS — I5022 Chronic systolic (congestive) heart failure: Secondary | ICD-10-CM | POA: Diagnosis not present

## 2018-07-15 DIAGNOSIS — I255 Ischemic cardiomyopathy: Secondary | ICD-10-CM

## 2018-07-15 DIAGNOSIS — Z95810 Presence of automatic (implantable) cardiac defibrillator: Secondary | ICD-10-CM

## 2018-07-15 DIAGNOSIS — I480 Paroxysmal atrial fibrillation: Secondary | ICD-10-CM

## 2018-07-15 NOTE — Patient Instructions (Signed)
Medication Instructions:  Your physician recommends that you continue on your current medications as directed. Please refer to the Current Medication list given to you today.  Labwork: None ordered.  Testing/Procedures: None ordered.  Follow-Up: Your physician wants you to follow-up in: one year with Dr. Lovena Le.   You will receive a reminder letter in the mail two months in advance. If you don't receive a letter, please call our office to schedule the follow-up appointment.  Remote monitoring is used to monitor your ICD from home. This monitoring reduces the number of office visits required to check your device to one time per year. It allows [place] to keep an eye on the functioning of your device to ensure it is working properly. You are scheduled for a device check from home on 07/22/2018. You may send your transmission at any time that day. If you have a wireless device, the transmission will be sent automatically. After your physician reviews your transmission, you will receive a postcard with your next transmission date.  Any Other Special Instructions Will Be Listed Below (If Applicable).  If you need a refill on your cardiac medications before your next appointment, please call your pharmacy.

## 2018-07-15 NOTE — Progress Notes (Signed)
HPI 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 He is a pleasant 78 yo man with an ICM, S/P ICD implant who has moved from [place] to [place]. He has class 2 CHF symptoms. He has a h/o CAD and PAF. He is stable and denies peripheral edema. No ICD shock. 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  Allergies  Allergen Reactions  . Hymenoptera Venom Preparations Hives    Reaction to wasps  . Lisinopril Other (See Comments)    angioedema  . Piroxicam Other (See Comments)    burn  . Amoxicillin-Pot Clavulanate Rash    Has patient had a PCN reaction causing immediate rash, facial/tongue/throat swelling, SOB or lightheadedness with hypotension: Yes Has patient had a PCN reaction causing severe rash involving mucus membranes or skin necrosis: No Has patient had a PCN reaction that required hospitalization Yes Has patient had a PCN reaction occurring within the last 10 years: No If all of the above answers are "NO", then may proceed with Cephalosporin use.      Current Outpatient Medications  Medication Sig Dispense Refill  . allopurinol (ZYLOPRIM) 300 MG tablet Take 300 mg by mouth daily.  6  . apixaban (ELIQUIS) 5 MG TABS tablet Take 5 mg by mouth daily.    Marland Kitchen atorvastatin (LIPITOR) 80 MG tablet Take 1 tablet (80 mg total) by mouth daily. 30 tablet 6  . carvedilol (COREG) 25 MG tablet Take 12.5 mg by mouth daily.   1  . furosemide (LASIX) 40 MG tablet Take 40 mg by mouth daily.  3  . hydrALAZINE (APRESOLINE) 25 MG tablet Take 1 tablet (25 mg total) by mouth 3 (three) times daily. (Patient taking differently: Take 25 mg by mouth daily. ) 270 tablet 3  . isosorbide mononitrate (IMDUR) 60 MG 24 hr tablet Take 1 tablet (60 mg total) by mouth daily. 30 tablet 4  . potassium chloride SA (K-DUR,KLOR-CON) 20 MEQ tablet Take 10 mEq by mouth daily.   6  . nitroGLYCERIN (NITROSTAT) 0.4 MG SL tablet Place 1 tablet (0.4 mg total) under  the tongue every 5 (five) minutes as needed for chest pain. 25 tablet 3   No current facility-administered medications for this visit.      Past Medical History:  Diagnosis Date  . Atrial fibrillation ([place])   . CHF (congestive heart failure) ([place])   . Coronary artery disease    Prior stent  . Degenerative joint disease   . Gout   . Hypercholesteremia   . Hypertension   . 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 Ischemic cardiomyopathy   . Renal insufficiency     ROS:   All systems reviewed and negative except as noted in the HPI.   Past Surgical History:  Procedure Laterality Date  . BACK SURGERY    . CARDIAC SURGERY    . PACEMAKER PLACEMENT    . TOTAL SHOULDER REPLACEMENT    . WRIST SURGERY       Family History  Problem Relation Age of Onset  . Heart disease Unknown        No family history  . Diabetes Sister      Social History   Socioeconomic History  . Marital status: Divorced    Spouse name: Not on file  . Number of children: 4  . Years of education: Not on file  . Highest education level: Not on file  Occupational  History  . Not on file  Social Needs  . Financial resource strain: Not on file  . Food insecurity:    Worry: Not on file    Inability: Not on file  . Transportation needs:    Medical: Not on file    Non-medical: Not on file  Tobacco Use  . Smoking status: Current Some Day Smoker    Types: Cigars  . Smokeless tobacco: Never Used  Substance and Sexual Activity  . Alcohol use: Yes    Alcohol/week: 0.0 standard drinks    Comment: Occasional  . Drug use: Never  . Sexual activity: Not on file  Lifestyle  . Physical activity:    Days per week: Not on file    Minutes per session: Not on file  . Stress: Not on file  Relationships  . Social connections:    Talks on phone: Not on file    Gets together: Not on file    Attends religious service: Not on file    Active member of club or organization: Not on  file    Attends meetings of clubs or organizations: Not on file    Relationship status: Not on file  . Intimate partner violence:    Fear of current or ex partner: Not on file    Emotionally abused: Not on file    Physically abused: Not on file    Forced sexual activity: Not on file  Other Topics Concern  . Not on file  Social History Narrative  . Not on file     BP 112/66   Pulse (!) 107   Ht 5\' 8"  (1.727 m)   Wt 194 lb (88 kg)   SpO2 92%   BMI 29.50 kg/m   Physical Exam:  Well appearing NAD HEENT: Unremarkable Neck:  No JVD, no thyromegally Lymphatics:  No adenopathy Back:  No CVA tenderness Lungs:  Clear HEART:  Regular rate rhythm, no murmurs, no rubs, no clicks Abd:  soft, positive bowel sounds, no organomegally, no rebound, no guarding Ext:  2 plus pulses, no edema, no cyanosis, no clubbing Skin:  No rashes no nodules Neuro:  CN II through XII intact, motor grossly intact   DEVICE  Normal device function.  See PaceArt for details.   Assess/Plan: 1. PAF - he is maintaining NSR over 99%. He will continue his blood thinner. 2. VT - he has had only NSVT. No change in his meds. He is asymptomatic. 3. ICD - his St. Jude DDD ICD is working normally except for a chronically elevated RV threshold. He will require a new lead when he reaches ERI. 4. Chronic systolic heart failure - he is class 2. He remains active bowling. He will continue his current CHF meds.  Mikle Bosworth.D.

## 2018-07-16 LAB — CUP PACEART INCLINIC DEVICE CHECK
Battery Remaining Longevity: 38 mo
HighPow Impedance: 45.5487
Implantable Lead Implant Date: 20140206
Implantable Lead Implant Date: 20140206
Implantable Lead Location: 753859
Implantable Lead Location: 753860
Lead Channel Impedance Value: 362.5 Ohm
Lead Channel Impedance Value: 412.5 Ohm
Lead Channel Pacing Threshold Amplitude: 1 V
Lead Channel Pacing Threshold Pulse Width: 0.5 ms
Lead Channel Pacing Threshold Pulse Width: 1 ms
Lead Channel Pacing Threshold Pulse Width: 1 ms
Lead Channel Sensing Intrinsic Amplitude: 1.4 mV
Lead Channel Sensing Intrinsic Amplitude: 11.2 mV
Lead Channel Setting Pacing Amplitude: 1.25 V
Lead Channel Setting Pacing Amplitude: 5 V
Lead Channel Setting Pacing Pulse Width: 0.5 ms
MDC IDC MSMT LEADCHNL RA PACING THRESHOLD AMPLITUDE: 3.25 V
MDC IDC MSMT LEADCHNL RA PACING THRESHOLD AMPLITUDE: 3.25 V
MDC IDC PG IMPLANT DT: 20170206
MDC IDC PG SERIAL: 7072725
MDC IDC SESS DTM: 20200122143107
MDC IDC SET LEADCHNL RV SENSING SENSITIVITY: 0.5 mV
MDC IDC STAT BRADY RA PERCENT PACED: 1.3 %
MDC IDC STAT BRADY RV PERCENT PACED: 26 %

## 2018-07-22 ENCOUNTER — Ambulatory Visit (INDEPENDENT_AMBULATORY_CARE_PROVIDER_SITE_OTHER): Payer: Medicare Other

## 2018-07-22 DIAGNOSIS — I255 Ischemic cardiomyopathy: Secondary | ICD-10-CM | POA: Diagnosis not present

## 2018-07-22 DIAGNOSIS — I5022 Chronic systolic (congestive) heart failure: Secondary | ICD-10-CM

## 2018-07-23 NOTE — Progress Notes (Signed)
Remote ICD transmission.   

## 2018-07-24 LAB — CUP PACEART REMOTE DEVICE CHECK
Battery Remaining Percentage: 37 %
Battery Voltage: 2.89 V
Brady Statistic AP VS Percent: 1 %
Brady Statistic AS VP Percent: 46 %
Brady Statistic AS VS Percent: 52 %
Brady Statistic RA Percent Paced: 1 %
Brady Statistic RV Percent Paced: 46 %
Date Time Interrogation Session: 20200129070014
HIGH POWER IMPEDANCE MEASURED VALUE: 40 Ohm
HighPow Impedance: 39 Ohm
Implantable Lead Implant Date: 20140206
Lead Channel Impedance Value: 330 Ohm
Lead Channel Impedance Value: 380 Ohm
Lead Channel Pacing Threshold Amplitude: 3.25 V
Lead Channel Pacing Threshold Pulse Width: 0.5 ms
Lead Channel Sensing Intrinsic Amplitude: 11.5 mV
Lead Channel Setting Pacing Amplitude: 1.25 V
MDC IDC LEAD IMPLANT DT: 20140206
MDC IDC LEAD LOCATION: 753859
MDC IDC LEAD LOCATION: 753860
MDC IDC MSMT BATTERY REMAINING LONGEVITY: 29 mo
MDC IDC MSMT LEADCHNL RA PACING THRESHOLD PULSEWIDTH: 1 ms
MDC IDC MSMT LEADCHNL RA SENSING INTR AMPL: 1 mV
MDC IDC MSMT LEADCHNL RV PACING THRESHOLD AMPLITUDE: 1 V
MDC IDC PG IMPLANT DT: 20170206
MDC IDC PG SERIAL: 7072725
MDC IDC SET LEADCHNL RA PACING AMPLITUDE: 5 V
MDC IDC SET LEADCHNL RV PACING PULSEWIDTH: 0.5 ms
MDC IDC SET LEADCHNL RV SENSING SENSITIVITY: 0.5 mV
MDC IDC STAT BRADY AP VP PERCENT: 1 %

## 2018-07-31 ENCOUNTER — Other Ambulatory Visit: Payer: Self-pay

## 2018-07-31 DIAGNOSIS — N185 Chronic kidney disease, stage 5: Secondary | ICD-10-CM

## 2018-09-04 ENCOUNTER — Encounter: Payer: Self-pay | Admitting: Vascular Surgery

## 2018-09-04 ENCOUNTER — Ambulatory Visit (HOSPITAL_COMMUNITY)
Admission: RE | Admit: 2018-09-04 | Discharge: 2018-09-04 | Disposition: A | Discharge status: Home / Self Care | Payer: Medicare Other | Source: Ambulatory Visit | Attending: Family | Admitting: Family

## 2018-09-04 ENCOUNTER — Ambulatory Visit (INDEPENDENT_AMBULATORY_CARE_PROVIDER_SITE_OTHER): Payer: Medicare Other | Admitting: Vascular Surgery

## 2018-09-04 ENCOUNTER — Other Ambulatory Visit: Payer: Self-pay

## 2018-09-04 ENCOUNTER — Ambulatory Visit (INDEPENDENT_AMBULATORY_CARE_PROVIDER_SITE_OTHER)
Admission: RE | Admit: 2018-09-04 | Discharge: 2018-09-04 | Disposition: A | Discharge status: Home / Self Care | Payer: Medicare Other | Source: Ambulatory Visit | Attending: Family | Admitting: Family

## 2018-09-04 VITALS — BP 125/73 | HR 66 | Temp 97.0°F | Resp 20 | Ht 68.0 in | Wt 194.0 lb

## 2018-09-04 DIAGNOSIS — N185 Chronic kidney disease, stage 5: Secondary | ICD-10-CM

## 2018-09-04 NOTE — Progress Notes (Signed)
Patient ID: Anthony Foster, male   DOB: [birth year], 79 y.o.   MRN: [id number]  Reason for Consult: Chronic Kidney Disease (Dr. Marval Regal.  CKD 5.  Eval for perm access.  [phone].)   Referred by Wenda Low, MD  Subjective:     HPI:  Anthony Foster is a 78 y.o. 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  Has previous defibrillator on the left.  He is right-hand dominant and is a bowler.  He is on Eliquis.  Has a previous right wrist surgery for trigger finger.  Otherwise no arm surgery no swelling in his upper extremities.  Past Medical History:  Diagnosis Date  . Atrial fibrillation ([place])   . CHF (congestive heart failure) ([place])   . Coronary artery disease    Prior stent  . Degenerative joint disease   . Gout   . Hypercholesteremia   . Hypertension   . 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 Ischemic cardiomyopathy   . Renal insufficiency    Family History  Problem Relation Age of Onset  . Heart disease Unknown        No family history  . Diabetes Sister    Past Surgical History:  Procedure Laterality Date  . BACK SURGERY    . CARDIAC SURGERY    . PACEMAKER PLACEMENT    . TOTAL SHOULDER REPLACEMENT    . WRIST SURGERY      Short Social History:  Social History   Tobacco Use  . Smoking status: Current Some Day Smoker    Types: Cigars  . Smokeless tobacco: Never Used  Substance Use Topics  . Alcohol use: Yes    Alcohol/week: 0.0 standard drinks    Comment: Occasional    Allergies  Allergen Reactions  . Hymenoptera Venom Preparations Hives    Reaction to wasps  . Lisinopril Other (See Comments)    angioedema  . Piroxicam Other (See Comments)    burn  . Amoxicillin-Pot Clavulanate Rash    Has patient had a PCN reaction causing immediate rash, facial/tongue/throat swelling, SOB or lightheadedness with hypotension: Yes Has patient had a PCN reaction causing severe rash involving mucus membranes or  skin necrosis: No Has patient had a PCN reaction that required hospitalization Yes Has patient had a PCN reaction occurring within the last 10 years: No If all of the above answers are "NO", then may proceed with Cephalosporin use.     Current Outpatient Medications  Medication Sig Dispense Refill  . allopurinol (ZYLOPRIM) 300 MG tablet Take 300 mg by mouth daily.  6  . apixaban (ELIQUIS) 5 MG TABS tablet Take 5 mg by mouth daily.    Marland Kitchen atorvastatin (LIPITOR) 80 MG tablet Take 1 tablet (80 mg total) by mouth daily. 30 tablet 6  . carvedilol (COREG) 25 MG tablet Take 12.5 mg by mouth daily.   1  . furosemide (LASIX) 40 MG tablet Take 40 mg by mouth daily.  3  . hydrALAZINE (APRESOLINE) 25 MG tablet Take 1 tablet (25 mg total) by mouth 3 (three) times daily. (Patient taking differently: Take 25 mg by mouth daily. ) 270 tablet 3  . isosorbide mononitrate (IMDUR) 60 MG 24 hr tablet Take 1 tablet (60 mg total) by mouth daily. 30 tablet 4  . potassium chloride SA (K-DUR,KLOR-CON) 20 MEQ tablet Take 10 mEq by mouth daily.   6  . nitroGLYCERIN (NITROSTAT) 0.4 MG SL tablet Place 1 tablet (0.4 mg total) under the tongue every 5 (five)  minutes as needed for chest pain. 25 tablet 3   No current facility-administered medications for this visit.     Review of Systems  Constitutional:  Constitutional negative. HENT: HENT negative.  Eyes: Eyes negative.  Respiratory: Respiratory negative.  Cardiovascular: Cardiovascular negative.  GI: Gastrointestinal negative.  Musculoskeletal: Musculoskeletal negative.  Skin: Skin negative.  Neurological: Neurological negative. Hematologic: Hematologic/lymphatic negative.  Psychiatric: Psychiatric negative.        Objective:  Objective   Vitals:   09/04/18 1339  BP: 125/73  Pulse: 66  Resp: 20  Temp: (!) 97 F (36.1 C)  TempSrc: Oral  SpO2: 96%  Weight: 194 lb (88 kg)  Height: 5\' 8"  (1.727 m)   Body mass index is 29.5 kg/m.  Physical Exam  Constitutional:      Appearance: Normal appearance.  Eyes:     Pupils: Pupils are equal, round, and reactive to light.  Cardiovascular:     Rate and Rhythm: Normal rate.     Pulses:          Radial pulses are 2+ on the right side and 2+ on the left side.  Pulmonary:     Effort: Pulmonary effort is normal.  Abdominal:     General: Abdomen is flat.  Musculoskeletal:        General: No swelling.  Skin:    General: Skin is warm and dry.     Capillary Refill: Capillary refill takes less than 2 seconds.  Neurological:     General: No focal deficit present.     Mental Status: He is alert.  Psychiatric:        Mood and Affect: Mood normal.        Behavior: Behavior normal.        Thought Content: Thought content normal.        Judgment: Judgment normal.     Data: I have independently interpreted his bilateral upper extremity arterial duplex which demonstrates triphasic waveforms bilaterally at the brachial arteries right side 0.57 cm left 0.45 cm      I have also independently interpreted his bilateral extremity vein mapping which demonstrates suitable basilic and cephalic veins bilaterally.  Assessment/Plan:    78 year old male with chronic kidney disease.  He is here for consideration of dialysis access.  He is right-hand dominant but has a left-sided pacemaker.  He would prefer to avoid the right hand but week he has suitable cephalic vein in his right wrist and I would like to avoid the side with the pacemaker.  At this time he is not ready to schedule.  He can call to schedule what I think would be right arm AV fistula when he is ready.      Waynetta Sandy MD [facility]

## 2018-09-23 DIAGNOSIS — D631 Anemia in chronic kidney disease: Secondary | ICD-10-CM | POA: Diagnosis not present

## 2018-09-23 DIAGNOSIS — I2510 Atherosclerotic heart disease of native coronary artery without angina pectoris: Secondary | ICD-10-CM | POA: Diagnosis not present

## 2018-09-23 DIAGNOSIS — M109 Gout, unspecified: Secondary | ICD-10-CM | POA: Diagnosis not present

## 2018-09-23 DIAGNOSIS — Z720 Tobacco use: Secondary | ICD-10-CM | POA: Diagnosis not present

## 2018-09-23 DIAGNOSIS — I714 Abdominal aortic aneurysm, without rupture: Secondary | ICD-10-CM | POA: Diagnosis not present

## 2018-09-23 DIAGNOSIS — N189 Chronic kidney disease, unspecified: Secondary | ICD-10-CM | POA: Diagnosis not present

## 2018-09-23 DIAGNOSIS — N2581 Secondary hyperparathyroidism of renal origin: Secondary | ICD-10-CM | POA: Diagnosis not present

## 2018-09-23 DIAGNOSIS — I129 Hypertensive chronic kidney disease with stage 1 through stage 4 chronic kidney disease, or unspecified chronic kidney disease: Secondary | ICD-10-CM | POA: Diagnosis not present

## 2018-09-23 DIAGNOSIS — N184 Chronic kidney disease, stage 4 (severe): Secondary | ICD-10-CM | POA: Diagnosis not present

## 2018-09-23 DIAGNOSIS — E876 Hypokalemia: Secondary | ICD-10-CM | POA: Diagnosis not present

## 2018-09-23 DIAGNOSIS — J449 Chronic obstructive pulmonary disease, unspecified: Secondary | ICD-10-CM | POA: Diagnosis not present

## 2018-10-21 ENCOUNTER — Ambulatory Visit (INDEPENDENT_AMBULATORY_CARE_PROVIDER_SITE_OTHER): Payer: Medicare Other | Admitting: *Deleted

## 2018-10-21 ENCOUNTER — Other Ambulatory Visit: Payer: Self-pay

## 2018-10-21 DIAGNOSIS — I5022 Chronic systolic (congestive) heart failure: Secondary | ICD-10-CM

## 2018-10-21 DIAGNOSIS — I255 Ischemic cardiomyopathy: Secondary | ICD-10-CM

## 2018-10-21 LAB — CUP PACEART REMOTE DEVICE CHECK
Battery Remaining Longevity: 27 mo
Battery Remaining Percentage: 35 %
Battery Voltage: 2.89 V
Brady Statistic AP VP Percent: 1 %
Brady Statistic AP VS Percent: 1.2 %
Brady Statistic AS VP Percent: 47 %
Brady Statistic AS VS Percent: 49 %
Brady Statistic RA Percent Paced: 1.2 %
Brady Statistic RV Percent Paced: 48 %
Date Time Interrogation Session: 20200429060015
HighPow Impedance: 44 Ohm
HighPow Impedance: 44 Ohm
Implantable Lead Implant Date: 20140206
Implantable Lead Implant Date: 20140206
Implantable Lead Location: 753859
Implantable Lead Location: 753860
Implantable Pulse Generator Implant Date: 20170206
Lead Channel Impedance Value: 340 Ohm
Lead Channel Impedance Value: 410 Ohm
Lead Channel Pacing Threshold Amplitude: 0.875 V
Lead Channel Pacing Threshold Amplitude: 3.25 V
Lead Channel Pacing Threshold Pulse Width: 0.5 ms
Lead Channel Pacing Threshold Pulse Width: 1 ms
Lead Channel Sensing Intrinsic Amplitude: 0.8 mV
Lead Channel Sensing Intrinsic Amplitude: 12 mV
Lead Channel Setting Pacing Amplitude: 1.125
Lead Channel Setting Pacing Amplitude: 5 V
Lead Channel Setting Pacing Pulse Width: 0.5 ms
Lead Channel Setting Sensing Sensitivity: 0.5 mV
Pulse Gen Serial Number: 7072725

## 2018-10-30 NOTE — Progress Notes (Signed)
Remote ICD transmission.   

## 2018-11-12 ENCOUNTER — Other Ambulatory Visit: Payer: Self-pay

## 2018-11-24 ENCOUNTER — Encounter (HOSPITAL_COMMUNITY): Payer: Self-pay | Admitting: *Deleted

## 2018-11-25 ENCOUNTER — Other Ambulatory Visit: Payer: Self-pay

## 2018-11-25 ENCOUNTER — Encounter (HOSPITAL_COMMUNITY): Payer: Self-pay | Admitting: Vascular Surgery

## 2018-11-25 ENCOUNTER — Other Ambulatory Visit (HOSPITAL_COMMUNITY)
Admission: RE | Admit: 2018-11-25 | Discharge: 2018-11-25 | Disposition: A | Discharge status: Home / Self Care | Payer: Medicare Other | Source: Ambulatory Visit | Attending: Vascular Surgery | Admitting: Vascular Surgery

## 2018-11-25 DIAGNOSIS — Z1159 Encounter for screening for other viral diseases: Secondary | ICD-10-CM | POA: Diagnosis not present

## 2018-11-25 LAB — SARS CORONAVIRUS 2 BY RT PCR (HOSPITAL ORDER, PERFORMED IN ~~LOC~~ HOSPITAL LAB): SARS Coronavirus 2: NEGATIVE

## 2018-11-25 NOTE — Progress Notes (Signed)
Anesthesia Chart Review: SAME DAY WORK-UP   Case:  [id number] Date/Time:  11/26/18 1107   Procedure:  RIGHT ARM ARTERIOVENOUS (AV) FISTULA CREATION VERSUS RIGHT ARM ARTERIOVENOUS GRAFT (Right )   Anesthesia type:  Monitor Anesthesia Care   Pre-op diagnosis:  CHRONIC KIDNEY DISEASE - STAGE 4   Location:  [facility] OR ROOM 16 / [facility]   Surgeon:  Waynetta Sandy, MD      DISCUSSION: Patient is a 78 year old male scheduled for the above procedure. He is not yet on hemodialysis.  History includes smoking, afib/PAF (maintaing NSR > 99% as of 07/15/18), HTN, CAD (s/p PCI, details unknown), CHF, ischemic cardiomyopathy (EF 35-40%), St. Jude ICD (implant 07/30/12), AAA (3.5 cm 05/18/18), CKD stage IV. He moved to [place] from [place] around 06/2015, so prior to 2017 his cardiac care was through [facility]. Currently he is followed at [facility] and saw both EP and primary cardiology within the past ~ 6 months.   He is a same day work-up, so further evaluation and labs on the day of surgery. Per [facility], he is to hold Eliquis for 2 days prior to surgery. Presurgical COVID test in process.    PROVIDERS: Wenda Low, MD is PCP  - Cristopher Peru, MD is EP cardiologist. Last visit 07/15/18. Primarily maintaining SR. Some NSVT. No medication changes recommended. ICD working normally. Kirk Ruths, MD is primary cardiologist. Last visit 05/18/18. Continue medical therapy for CAD/ischemic CM recommended. Donato Heinz, MD is nephrologist   LABS: He is for updated labs on arrival.    IMAGES: CXR 01/04/18: FINDINGS: The lungs are clear. There is no pleural effusion or pneumothorax. The cardiac silhouette is within normal limits. Left pectoral AICD device. No acute osseous pathology. IMPRESSION: No active cardiopulmonary disease.   EKG: 03/08/18:  Sinus rhythm Prolonged PR interval Left anterior fascicular block Nonspecific T abnormalities, lateral leads No significant change since last  tracing Confirmed by Lacretia Leigh ([id number]) on 03/08/2018 3:35:21 PM   CV: AAA [place] 05/18/18: Summary: Abdominal Aorta: There is evidence of abnormal dilatation of the Mid Abdominal aorta. There is evidence of abnormal dilation of the Right Common Iliac artery and Left Common Iliac artery, with an increase diameter of .8 cm in the mid left common iliac  artery when compared to the prior exam. The largest aortic measurement is 3.5 cm. The largest aortic diameter remains essentially unchanged compared to prior exam. Previous diameter measurement was 3.4 cm obtained on 05/14/2017. - Suggest follow up study in 12 months.  Nuclear stress test 01/21/18:  Nuclear stress EF: 40%.  The left ventricular ejection fraction is moderately decreased (30-44%).  No T wave inversion was noted during stress.  There was no ST segment deviation noted during stress.  Defect 1: There is a medium defect of moderate severity.  There is prominent right ventricular uptake. Moderate size and intensity fixed inferior perfusion defect, suggestive of scar. There is adjacent bowel which may attenuate. LVEF 40% with inferior wall hypokinesis. RV uptake noted, suggestive of increased pulmonary pressure. This is an intermediate risk study. (Continued medical therapy recommended at 05/18/18 follow-up with Dr. Stanford Breed.)  Echo 09/27/15: Study Conclusions - Left ventricle: The cavity size was normal. Wall thickness was   increased in a pattern of mild LVH. Systolic function was   moderately reduced. The estimated ejection fraction was in the   range of 35% to 40%. Diffuse hypokinesis. Features are consistent   with a pseudonormal left ventricular filling pattern, with  concomitant abnormal relaxation and increased filling pressure   (grade 2 diastolic dysfunction). Doppler parameters are   consistent with elevated ventricular end-diastolic filling   pressure. - Aortic valve: Transvalvular velocity was within the normal  range.   There was no stenosis. There was no regurgitation. - Mitral valve: There was no regurgitation. - Left atrium: The atrium was moderately dilated. - Right ventricle: The cavity size was normal. Wall thickness was   normal. Systolic function was normal. - Atrial septum: No defect or patent foramen ovale was identified   by color flow Doppler. - Tricuspid valve: There was mild regurgitation. - Pulmonary arteries: Systolic pressure was within the normal   range. PA peak pressure: 24 mm Hg (S). - Inferior vena cava: The vessel was normal in size. The   respirophasic diameter changes were in the normal range (>= 50%),   consistent with normal central venous pressure.   Past Medical History:  Diagnosis Date  . AAA (abdominal aortic aneurysm) ([facility])    3.5 cm 04/2018  . Atrial fibrillation ([place])   . CHF (congestive heart failure) ([place])   . Coronary artery disease    Prior stent  . Degenerative joint disease   . Gout   . Hypercholesteremia   . Hypertension   . 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 Ischemic cardiomyopathy   . Renal insufficiency     Past Surgical History:  Procedure Laterality Date  . BACK SURGERY    . CARDIAC SURGERY    . PACEMAKER PLACEMENT    . TOTAL SHOULDER REPLACEMENT    . WRIST SURGERY      MEDICATIONS: No current facility-administered medications for this encounter.    Marland Kitchen allopurinol (ZYLOPRIM) 300 MG tablet  . apixaban (ELIQUIS) 5 MG TABS tablet  . atorvastatin (LIPITOR) 80 MG tablet  . carvedilol (COREG) 25 MG tablet  . furosemide (LASIX) 40 MG tablet  . hydrALAZINE (APRESOLINE) 25 MG tablet  . isosorbide mononitrate (IMDUR) 60 MG 24 hr tablet  . nitroGLYCERIN (NITROSTAT) 0.4 MG SL tablet  . potassium chloride SA (K-DUR,KLOR-CON) 20 MEQ tablet    Myra Gianotti, PA-C Surgical Short Stay/Anesthesiology [facility] Phone [phone] [facility] Phone [phone] 11/25/2018 11:06 AM

## 2018-11-25 NOTE — Progress Notes (Addendum)
Nurse spoke with Windle Guard, [place] Rep, to make aware that " procedure may interfere with device function. Magnet should be placed over device during procedure. Post-op interrogation needed? NO "

## 2018-11-25 NOTE — Anesthesia Preprocedure Evaluation (Addendum)
Anesthesia Evaluation  Patient identified by MRN, date of birth, ID band Patient awake    Reviewed: Allergy & Precautions, NPO status , Patient's Chart, lab work & pertinent test results  Airway Mallampati: II  TM Distance: >3 FB Neck ROM: Full    Dental  (+) Edentulous Upper   Pulmonary Current Smoker,    breath sounds clear to auscultation       Cardiovascular hypertension,  Rhythm:Regular Rate:Normal     Neuro/Psych    GI/Hepatic   Endo/Other    Renal/GU      Musculoskeletal   Abdominal   Peds  Hematology   Anesthesia Other Findings   Reproductive/Obstetrics                           Anesthesia Physical Anesthesia Plan  ASA: III  Anesthesia Plan: MAC   Post-op Pain Management:    Induction: Intravenous  PONV Risk Score and Plan: Ondansetron and Propofol infusion  Airway Management Planned: Simple Face Mask and Natural Airway  Additional Equipment:   Intra-op Plan:   Post-operative Plan:   Informed Consent: I have reviewed the patients History and Physical, chart, labs and discussed the procedure including the risks, benefits and alternatives for the proposed anesthesia with the patient or authorized representative who has indicated his/her understanding and acceptance.       Plan Discussed with: CRNA and Anesthesiologist  Anesthesia Plan Comments: (PAT note written 11/25/2018 by Myra Gianotti, PA-C. )       Anesthesia Quick Evaluation

## 2018-11-25 NOTE — Progress Notes (Signed)
Pt denies any acute cardiopulmonary issues. Pt stated that he is under the care of both Dr. Stanford Breed and Dr. Cristopher Peru, Cardiology. Pt stated that last dose of Eliquis was Monday as instructed. Pt made aware to stop taking vitamins, fish oil and herbal medications. Do not take any NSAIDs ie: Ibuprofen, Advil, Naproxen (Aleve), Motrin, BC and Goody Powder.   Pt denies that he and members of family tested positive for COVID-19 (tested today and reminded to quarantine).  Pt denies that he and family members experienced the following symptoms:  Cough yes/no: No Fever (>100.51F)  yes/no: No Runny nose yes/no: No Sore throat yes/no: No Difficulty breathing/shortness of breath  yes/no: No  Have you or a family member traveled in the last 14 days and where? yes/no: No  Pt made aware that hospital visitation restrictions are in effect and the importance of the restrictions.   Pt verbalized understanding of all pre-op instructions.  PA, Anesthesiology, asked to review pt history; see note.

## 2018-11-26 ENCOUNTER — Ambulatory Visit (HOSPITAL_COMMUNITY): Payer: Medicare Other | Admitting: Vascular Surgery

## 2018-11-26 ENCOUNTER — Encounter (HOSPITAL_COMMUNITY)
Admission: RE | Disposition: A | Discharge status: Home / Self Care | Payer: Self-pay | Source: Home / Self Care | Attending: Vascular Surgery

## 2018-11-26 ENCOUNTER — Other Ambulatory Visit: Payer: Self-pay

## 2018-11-26 ENCOUNTER — Ambulatory Visit (HOSPITAL_COMMUNITY)
Admission: RE | Admit: 2018-11-26 | Discharge: 2018-11-26 | Disposition: A | Discharge status: Home / Self Care | Payer: Medicare Other | Attending: Vascular Surgery | Admitting: Vascular Surgery

## 2018-11-26 ENCOUNTER — Encounter (HOSPITAL_COMMUNITY): Payer: Self-pay

## 2018-11-26 DIAGNOSIS — Z833 Family history of diabetes mellitus: Secondary | ICD-10-CM | POA: Insufficient documentation

## 2018-11-26 DIAGNOSIS — F17290 Nicotine dependence, other tobacco product, uncomplicated: Secondary | ICD-10-CM | POA: Diagnosis not present

## 2018-11-26 DIAGNOSIS — M109 Gout, unspecified: Secondary | ICD-10-CM | POA: Insufficient documentation

## 2018-11-26 DIAGNOSIS — Z95810 Presence of automatic (implantable) cardiac defibrillator: Secondary | ICD-10-CM | POA: Diagnosis not present

## 2018-11-26 DIAGNOSIS — Z79899 Other long term (current) drug therapy: Secondary | ICD-10-CM | POA: Insufficient documentation

## 2018-11-26 DIAGNOSIS — I4891 Unspecified atrial fibrillation: Secondary | ICD-10-CM | POA: Insufficient documentation

## 2018-11-26 DIAGNOSIS — E7800 Pure hypercholesterolemia, unspecified: Secondary | ICD-10-CM | POA: Insufficient documentation

## 2018-11-26 DIAGNOSIS — Z880 Allergy status to penicillin: Secondary | ICD-10-CM | POA: Insufficient documentation

## 2018-11-26 DIAGNOSIS — I255 Ischemic cardiomyopathy: Secondary | ICD-10-CM | POA: Diagnosis not present

## 2018-11-26 DIAGNOSIS — I130 Hypertensive heart and chronic kidney disease with heart failure and stage 1 through stage 4 chronic kidney disease, or unspecified chronic kidney disease: Secondary | ICD-10-CM | POA: Diagnosis not present

## 2018-11-26 DIAGNOSIS — I714 Abdominal aortic aneurysm, without rupture: Secondary | ICD-10-CM | POA: Diagnosis not present

## 2018-11-26 DIAGNOSIS — N184 Chronic kidney disease, stage 4 (severe): Secondary | ICD-10-CM | POA: Diagnosis not present

## 2018-11-26 DIAGNOSIS — I509 Heart failure, unspecified: Secondary | ICD-10-CM | POA: Insufficient documentation

## 2018-11-26 DIAGNOSIS — I2510 Atherosclerotic heart disease of native coronary artery without angina pectoris: Secondary | ICD-10-CM | POA: Diagnosis not present

## 2018-11-26 DIAGNOSIS — Z96619 Presence of unspecified artificial shoulder joint: Secondary | ICD-10-CM | POA: Insufficient documentation

## 2018-11-26 DIAGNOSIS — Z888 Allergy status to other drugs, medicaments and biological substances status: Secondary | ICD-10-CM | POA: Diagnosis not present

## 2018-11-26 DIAGNOSIS — Z7901 Long term (current) use of anticoagulants: Secondary | ICD-10-CM | POA: Insufficient documentation

## 2018-11-26 DIAGNOSIS — Z955 Presence of coronary angioplasty implant and graft: Secondary | ICD-10-CM | POA: Diagnosis not present

## 2018-11-26 HISTORY — PX: AV FISTULA PLACEMENT: SHX1204

## 2018-11-26 HISTORY — DX: Presence of dental prosthetic device (complete) (partial): Z97.2

## 2018-11-26 HISTORY — DX: Pneumonia, unspecified organism: J18.9

## 2018-11-26 HISTORY — DX: Abdominal aortic aneurysm, without rupture: I71.4

## 2018-11-26 HISTORY — DX: Complete loss of teeth, unspecified cause, unspecified class: K08.109

## 2018-11-26 HISTORY — DX: Presence of spectacles and contact lenses: Z97.3

## 2018-11-26 HISTORY — DX: Abdominal aortic aneurysm, without rupture, unspecified: I71.40

## 2018-11-26 LAB — CBC
HCT: 45.2 % (ref 39.0–52.0)
Hemoglobin: 13.6 g/dL (ref 13.0–17.0)
MCH: 26.4 pg (ref 26.0–34.0)
MCHC: 30.1 g/dL (ref 30.0–36.0)
MCV: 87.6 fL (ref 80.0–100.0)
Platelets: 188 10*3/uL (ref 150–400)
RBC: 5.16 MIL/uL (ref 4.22–5.81)
RDW: 17.2 % — ABNORMAL HIGH (ref 11.5–15.5)
WBC: 10.9 10*3/uL — ABNORMAL HIGH (ref 4.0–10.5)
nRBC: 0 % (ref 0.0–0.2)

## 2018-11-26 LAB — BASIC METABOLIC PANEL
Anion gap: 10 (ref 5–15)
BUN: 48 mg/dL — ABNORMAL HIGH (ref 8–23)
CO2: 25 mmol/L (ref 22–32)
Calcium: 10 mg/dL (ref 8.9–10.3)
Chloride: 108 mmol/L (ref 98–111)
Creatinine, Ser: 3.35 mg/dL — ABNORMAL HIGH (ref 0.61–1.24)
GFR calc Af Amer: 19 mL/min — ABNORMAL LOW (ref >60)
GFR calc non Af Amer: 17 mL/min — ABNORMAL LOW (ref >60)
Glucose, Bld: 105 mg/dL — ABNORMAL HIGH (ref 70–99)
Potassium: 4.1 mmol/L (ref 3.5–5.1)
Sodium: 143 mmol/L (ref 135–145)

## 2018-11-26 LAB — PROTIME-INR
INR: 1.1 (ref 0.8–1.2)
Prothrombin Time: 14.1 seconds (ref 11.4–15.2)

## 2018-11-26 SURGERY — ARTERIOVENOUS (AV) FISTULA CREATION
Anesthesia: Monitor Anesthesia Care | Site: Arm Lower | Laterality: Right

## 2018-11-26 MED ORDER — PHENYLEPHRINE 40 MCG/ML (10ML) SYRINGE FOR IV PUSH (FOR BLOOD PRESSURE SUPPORT)
PREFILLED_SYRINGE | INTRAVENOUS | Status: AC
  Filled 2018-11-26: qty 10

## 2018-11-26 MED ORDER — OXYCODONE HCL 5 MG PO TABS: 5.0000 mg | ORAL_TABLET | Freq: Once | ORAL | Status: DC | PRN

## 2018-11-26 MED ORDER — LIDOCAINE-EPINEPHRINE (PF) 1 %-1:200000 IJ SOLN
INTRAMUSCULAR | Status: AC
  Filled 2018-11-26: qty 30

## 2018-11-26 MED ORDER — SODIUM CHLORIDE 0.9 % IV SOLN
INTRAVENOUS | Status: DC
  Administered 2018-11-26: 09:00:00 via INTRAVENOUS

## 2018-11-26 MED ORDER — SODIUM CHLORIDE 0.9 % IV SOLN
INTRAVENOUS | Status: AC
  Filled 2018-11-26: qty 1.2

## 2018-11-26 MED ORDER — FENTANYL CITRATE (PF) 250 MCG/5ML IJ SOLN
INTRAMUSCULAR | Status: AC
  Filled 2018-11-26: qty 5

## 2018-11-26 MED ORDER — CHLORHEXIDINE GLUCONATE 4 % EX LIQD: 60.0000 mL | Freq: Once | CUTANEOUS | Status: DC

## 2018-11-26 MED ORDER — ONDANSETRON HCL 4 MG/2ML IJ SOLN
INTRAMUSCULAR | Status: DC | PRN
  Administered 2018-11-26: 4 mg via INTRAVENOUS

## 2018-11-26 MED ORDER — SODIUM CHLORIDE 0.9 % IV SOLN
INTRAVENOUS | Status: DC | PRN
  Administered 2018-11-26: 11:00:00 via INTRAVENOUS

## 2018-11-26 MED ORDER — 0.9 % SODIUM CHLORIDE (POUR BTL) OPTIME
TOPICAL | Status: DC | PRN
  Administered 2018-11-26: 1000 mL

## 2018-11-26 MED ORDER — SODIUM CHLORIDE 0.9 % IV SOLN
INTRAVENOUS | Status: DC | PRN
  Administered 2018-11-26: 500 mL

## 2018-11-26 MED ORDER — EPHEDRINE 5 MG/ML INJ
INTRAVENOUS | Status: AC
  Filled 2018-11-26: qty 20

## 2018-11-26 MED ORDER — MIDAZOLAM HCL 5 MG/5ML IJ SOLN
INTRAMUSCULAR | Status: DC | PRN
  Administered 2018-11-26: 1 mg via INTRAVENOUS

## 2018-11-26 MED ORDER — LIDOCAINE-EPINEPHRINE (PF) 1 %-1:200000 IJ SOLN
INTRAMUSCULAR | Status: DC | PRN
  Administered 2018-11-26: 6 mL

## 2018-11-26 MED ORDER — MIDAZOLAM HCL 2 MG/2ML IJ SOLN
INTRAMUSCULAR | Status: AC
  Filled 2018-11-26: qty 2

## 2018-11-26 MED ORDER — FENTANYL CITRATE (PF) 100 MCG/2ML IJ SOLN: 25.0000 ug | INTRAMUSCULAR | Status: DC | PRN

## 2018-11-26 MED ORDER — EPHEDRINE SULFATE-NACL 50-0.9 MG/10ML-% IV SOSY
PREFILLED_SYRINGE | INTRAVENOUS | Status: DC | PRN
  Administered 2018-11-26 (×5): 10 mg via INTRAVENOUS

## 2018-11-26 MED ORDER — PROTAMINE SULFATE 10 MG/ML IV SOLN
INTRAVENOUS | Status: AC
  Filled 2018-11-26: qty 5

## 2018-11-26 MED ORDER — VANCOMYCIN HCL IN DEXTROSE 1-5 GM/200ML-% IV SOLN
INTRAVENOUS | Status: AC
  Administered 2018-11-26: 1000 mg via INTRAVENOUS
  Filled 2018-11-26: qty 200

## 2018-11-26 MED ORDER — PROPOFOL 500 MG/50ML IV EMUL
INTRAVENOUS | Status: DC | PRN
  Administered 2018-11-26: 30 ug/kg/min via INTRAVENOUS

## 2018-11-26 MED ORDER — FENTANYL CITRATE (PF) 100 MCG/2ML IJ SOLN
INTRAMUSCULAR | Status: DC | PRN
  Administered 2018-11-26 (×2): 50 ug via INTRAVENOUS

## 2018-11-26 MED ORDER — VANCOMYCIN HCL IN DEXTROSE 1-5 GM/200ML-% IV SOLN
1000.0000 mg | INTRAVENOUS | Status: AC
  Administered 2018-11-26: 09:00:00 1000 mg via INTRAVENOUS

## 2018-11-26 MED ORDER — OXYCODONE HCL 5 MG/5ML PO SOLN: 5.0000 mg | Freq: Once | ORAL | Status: DC | PRN

## 2018-11-26 MED ORDER — PROPOFOL 10 MG/ML IV BOLUS
INTRAVENOUS | Status: AC
  Filled 2018-11-26: qty 20

## 2018-11-26 MED ORDER — OXYCODONE-ACETAMINOPHEN 5-325 MG PO TABS: 1.0000 | ORAL_TABLET | Freq: Four times a day (QID) | ORAL | 0 refills | Status: DC | PRN

## 2018-11-26 MED ORDER — PHENYLEPHRINE 40 MCG/ML (10ML) SYRINGE FOR IV PUSH (FOR BLOOD PRESSURE SUPPORT)
PREFILLED_SYRINGE | INTRAVENOUS | Status: DC | PRN
  Administered 2018-11-26: 120 ug via INTRAVENOUS
  Administered 2018-11-26: 80 ug via INTRAVENOUS

## 2018-11-26 MED ORDER — ONDANSETRON HCL 4 MG/2ML IJ SOLN: 4.0000 mg | Freq: Once | INTRAMUSCULAR | Status: DC | PRN

## 2018-11-26 MED ORDER — ONDANSETRON HCL 4 MG/2ML IJ SOLN
INTRAMUSCULAR | Status: AC
  Filled 2018-11-26: qty 2

## 2018-11-26 SURGICAL SUPPLY — 28 items
ARMBAND PINK RESTRICT EXTREMIT (MISCELLANEOUS) ×2 IMPLANT
CANISTER SUCT 3000ML PPV (MISCELLANEOUS) ×2 IMPLANT
CLIP VESOCCLUDE MED 6/CT (CLIP) ×2 IMPLANT
CLIP VESOCCLUDE SM WIDE 6/CT (CLIP) ×2 IMPLANT
COVER PROBE W GEL 5X96 (DRAPES) IMPLANT
COVER WAND RF STERILE (DRAPES) ×2 IMPLANT
DERMABOND ADVANCED (GAUZE/BANDAGES/DRESSINGS) ×1
DERMABOND ADVANCED .7 DNX12 (GAUZE/BANDAGES/DRESSINGS) ×1 IMPLANT
ELECT REM PT RETURN 9FT ADLT (ELECTROSURGICAL) ×2
ELECTRODE REM PT RTRN 9FT ADLT (ELECTROSURGICAL) ×1 IMPLANT
GLOVE BIO SURGEON STRL SZ7.5 (GLOVE) ×2 IMPLANT
GOWN STRL REUS W/ TWL LRG LVL3 (GOWN DISPOSABLE) ×2 IMPLANT
GOWN STRL REUS W/ TWL XL LVL3 (GOWN DISPOSABLE) ×1 IMPLANT
GOWN STRL REUS W/TWL LRG LVL3 (GOWN DISPOSABLE) ×2
GOWN STRL REUS W/TWL XL LVL3 (GOWN DISPOSABLE) ×1
INSERT FOGARTY SM (MISCELLANEOUS) IMPLANT
KIT BASIN OR (CUSTOM PROCEDURE TRAY) ×2 IMPLANT
KIT TURNOVER KIT B (KITS) ×2 IMPLANT
NS IRRIG 1000ML POUR BTL (IV SOLUTION) ×2 IMPLANT
PACK CV ACCESS (CUSTOM PROCEDURE TRAY) ×2 IMPLANT
PAD ARMBOARD 7.5X6 YLW CONV (MISCELLANEOUS) ×4 IMPLANT
SUT MNCRL AB 4-0 PS2 18 (SUTURE) ×2 IMPLANT
SUT PROLENE 6 0 BV (SUTURE) ×2 IMPLANT
SUT VIC AB 3-0 SH 27 (SUTURE) ×1
SUT VIC AB 3-0 SH 27X BRD (SUTURE) ×1 IMPLANT
TOWEL GREEN STERILE (TOWEL DISPOSABLE) ×2 IMPLANT
UNDERPAD 30X30 (UNDERPADS AND DIAPERS) ×2 IMPLANT
WATER STERILE IRR 1000ML POUR (IV SOLUTION) ×2 IMPLANT

## 2018-11-26 NOTE — Discharge Instructions (Signed)
° °  [facility] ° °Discharge Instructions ° °AV Fistula or Graft Surgery for Dialysis Access ° °Please refer to the following instructions for your post-procedure care. Your surgeon or physician assistant will discuss any changes with you. ° °Activity ° °You may drive the day following your surgery, if you are comfortable and no longer taking prescription pain medication. Resume full activity as the soreness in your incision resolves. ° °Bathing/Showering ° °You may shower after you go home. Keep your incision dry for 48 hours. Do not soak in a bathtub, hot tub, or swim until the incision heals completely. You may not shower if you have a hemodialysis catheter. ° °Incision Care ° °Clean your incision with mild soap and water after 48 hours. Pat the area dry with a clean towel. You do not need a bandage unless otherwise instructed. Do not apply any ointments or creams to your incision. You may have skin glue on your incision. Do not peel it off. It will come off on its own in about one week. Your arm may swell a bit after surgery. To reduce swelling use pillows to elevate your arm so it is above your heart. Your doctor will tell you if you need to lightly wrap your arm with an ACE bandage. ° °Diet ° °Resume your normal diet. There are not special food restrictions following this procedure. In order to heal from your surgery, it is CRITICAL to get adequate nutrition. Your body requires vitamins, minerals, and protein. Vegetables are the best source of vitamins and minerals. Vegetables also provide the perfect balance of protein. Processed food has little nutritional value, so try to avoid this. ° °Medications ° °Resume taking all of your medications. If your incision is causing pain, you may take over-the counter pain relievers such as acetaminophen (Tylenol). If you were prescribed a stronger pain medication, please be aware these medications can cause nausea and constipation. Prevent  nausea by taking the medication with a snack or meal. Avoid constipation by drinking plenty of fluids and eating foods with high amount of fiber, such as fruits, vegetables, and grains. Do not take Tylenol if you are taking prescription pain medications. ° ° ° ° °Follow up °Your surgeon may want to see you in the office following your access surgery. If so, this will be arranged at the time of your surgery. ° °Please call us immediately for any of the following conditions: ° °Increased pain, redness, drainage (pus) from your incision site °Fever of 101 degrees or higher °Severe or worsening pain at your incision site °Hand pain or numbness. ° °Reduce your risk of vascular disease: ° °Stop smoking. If you would like help, call [facility] at 1-800-QUIT-NOW (1-800-784-8669) or [facility] at 336-586-4000 ° °Manage your cholesterol °Maintain a desired weight °Control your diabetes °Keep your blood pressure down ° °Dialysis ° °It will take several weeks to several months for your new dialysis access to be ready for use. Your surgeon will determine when it is OK to use it. Your nephrologist will continue to direct your dialysis. You can continue to use your Permcath until your new access is ready for use. ° °If you have any questions, please call the office at [phone]. ° °

## 2018-11-26 NOTE — Op Note (Signed)
    Patient name: Anthony Foster MRN: [id number] DOB: 1940/12/10 Sex: male  11/26/2018 Pre-operative Diagnosis: Chronic kidney disease Post-operative diagnosis:  Same Surgeon:  Eda Paschal. Donzetta Matters, MD Assistant: Ellsworth Lennox, RN Procedure Performed:  Right arm brachiocephalic AV fistula creation  Indications: 78 year old male with history of chronic kidney disease.  He is now indicated for dialysis access creation.  He is right-hand dominant but does have left-sided pacemaker.  He is also a bowler and would like to avoid any wrist access surgery.  Findings: Cephalic vein at the antecubital measured approximately 5 mm.  It was free of disease.  Brachial artery is 4 mm and free of disease.  There was a median cubital vein and flow was restored to the basilic vein.  At completion there was a strong thrill and a palpable radial pulse the wrist both confirmed with Doppler.   Procedure:  The patient was identified in the holding area and taken to the operating room where is put supine operative table MAC anesthesia induced.  He was sterilely prepped draped in the right upper extremity usual fashion, antibiotics were administered, a timeout was called.  Ultrasound was used to identify the cephalic vein which is noted to be suitable for size for fistula creation.  Transverse incision was made after the area was anesthetized 1% lidocaine with epinephrine.  We dissected out the cephalic vein margin for orientation.  Dissected deeper to the fascia identified the brachial artery vessel loop was placed around this.  Cephalic vein was transected after being clamped at the median cubital level and this was tied off.  I did have to free it up to get it to reach to the level of the brachial artery and 2 branches were taken between clips and ties.  The artery was then clamped distally proximally opened longitudinally.  Was flushed heparinized saline both directions.  The vein was spatulated sewn end-to-side with 6-0  Prolene suture.  Prior to completion anastomosis we allowed flushing all direction.  Upon completion there was a very strong thrill.  The artery was somewhat tethered so we freed up the vein a little better.  There was a good palpable radial pulse at the wrist.  We confirmed this with Doppler.  There was a strong thrill also confirmed with Doppler heading up the arm cephalad.  We irrigated the wound closed the fascia with 3-0 Vicryl and the skin with 4 Monocryl.  Dermabond placed to level skin.  He tolerated procedure without immediate complication.  All counts were correct at completion.  EBL: 20 cc   Lavell Supple C. Donzetta Matters, MD [facility] Office: [phone] Pager: [phone]

## 2018-11-26 NOTE — Anesthesia Postprocedure Evaluation (Signed)
Anesthesia Post Note  Patient: Anthony Foster  Procedure(s) Performed:     Right arm Brachiocephalic Fistula Creation    (Right Arm Lower)     Patient location during evaluation: PACU Anesthesia Type: MAC Level of consciousness: awake and alert Pain management: pain level controlled Vital Signs Assessment: post-procedure vital signs reviewed and stable Respiratory status: nonlabored ventilation, respiratory function stable, patient connected to nasal cannula oxygen and spontaneous breathing Cardiovascular status: blood pressure returned to baseline and stable Postop Assessment: no apparent nausea or vomiting Anesthetic complications: no    Last Vitals:  Vitals:   11/26/18 1315 11/26/18 1330  BP: (!) 108/48 (!) 108/56  Pulse: 65 67  Resp: 17 15  Temp:  36.4 C  SpO2: 98% 100%    Last Pain:  Vitals:   11/26/18 0923  TempSrc:   PainSc: 0-No pain                 Simon Llamas COKER

## 2018-11-26 NOTE — H&P (Signed)
HPI:  Anthony Foster is a 78 y.o. 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  Has previous defibrillator on the left.  He is right-hand dominant and is a bowler.  He is on Eliquis.  Has a previous right wrist surgery for trigger finger.  Otherwise no arm surgery no swelling in his upper extremities.      Past Medical History:  Diagnosis Date  . Atrial fibrillation ([place])   . CHF (congestive heart failure) ([place])   . Coronary artery disease    Prior stent  . Degenerative joint disease   . Gout   . Hypercholesteremia   . Hypertension   . 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 Ischemic cardiomyopathy   . Renal insufficiency         Family History  Problem Relation Age of Onset  . Heart disease Unknown        No family history  . Diabetes Sister    Past Surgical History:  Procedure Laterality Date  . BACK SURGERY    . CARDIAC SURGERY    . PACEMAKER PLACEMENT    . TOTAL SHOULDER REPLACEMENT    . WRIST SURGERY      Short Social History:  Social History        Tobacco Use  . Smoking status: Current Some Day Smoker    Types: Cigars  . Smokeless tobacco: Never Used  Substance Use Topics  . Alcohol use: Yes    Alcohol/week: 0.0 standard drinks    Comment: Occasional         Allergies  Allergen Reactions  . Hymenoptera Venom Preparations Hives    Reaction to wasps  . Lisinopril Other (See Comments)    angioedema  . Piroxicam Other (See Comments)    burn  . Amoxicillin-Pot Clavulanate Rash    Has patient had a PCN reaction causing immediate rash, facial/tongue/throat swelling, SOB or lightheadedness with hypotension: Yes Has patient had a PCN reaction causing severe rash involving mucus membranes or skin necrosis: No Has patient had a PCN reaction that required hospitalization Yes Has patient had a PCN reaction occurring within the last 10 years: No If all of the above  answers are "NO", then may proceed with Cephalosporin use.           Current Outpatient Medications  Medication Sig Dispense Refill  . allopurinol (ZYLOPRIM) 300 MG tablet Take 300 mg by mouth daily.  6  . apixaban (ELIQUIS) 5 MG TABS tablet Take 5 mg by mouth daily.    Marland Kitchen atorvastatin (LIPITOR) 80 MG tablet Take 1 tablet (80 mg total) by mouth daily. 30 tablet 6  . carvedilol (COREG) 25 MG tablet Take 12.5 mg by mouth daily.   1  . furosemide (LASIX) 40 MG tablet Take 40 mg by mouth daily.  3  . hydrALAZINE (APRESOLINE) 25 MG tablet Take 1 tablet (25 mg total) by mouth 3 (three) times daily. (Patient taking differently: Take 25 mg by mouth daily. ) 270 tablet 3  . isosorbide mononitrate (IMDUR) 60 MG 24 hr tablet Take 1 tablet (60 mg total) by mouth daily. 30 tablet 4  . potassium chloride SA (K-DUR,KLOR-CON) 20 MEQ tablet Take 10 mEq by mouth daily.   6  . nitroGLYCERIN (NITROSTAT) 0.4 MG SL tablet Place 1 tablet (0.4 mg total) under the tongue every 5 (five) minutes as needed for chest pain. 25 tablet 3   No current facility-administered medications for this visit.  Review of Systems  Constitutional:  Constitutional negative. HENT: HENT negative.  Eyes: Eyes negative.  Respiratory: Respiratory negative.  Cardiovascular: Cardiovascular negative.  GI: Gastrointestinal negative.  Musculoskeletal: Musculoskeletal negative.  Skin: Skin negative.  Neurological: Neurological negative. Hematologic: Hematologic/lymphatic negative.  Psychiatric: Psychiatric negative.        Objective:   Vitals:   11/26/18 0904  BP: (!) 102/44  Pulse: 64  Resp: 18  Temp: 97.8 F (36.6 C)  SpO2: 100%     Physical Exam Constitutional:      Appearance: Normal appearance.  Eyes:     Pupils: Pupils are equal, round, and reactive to light.  Cardiovascular:     Rate and Rhythm: Normal rate.     Pulses:          Radial pulses are 2+ on the right side and 2+ on the left side.   Pulmonary:     Effort: Pulmonary effort is normal.  Abdominal:     General: Abdomen is flat.  Musculoskeletal:        General: No swelling.  Skin:    General: Skin is warm and dry.     Capillary Refill: Capillary refill takes less than 2 seconds.  Neurological:     General: No focal deficit present.     Mental Status: He is alert.  Psychiatric:        Mood and Affect: Mood normal.        Behavior: Behavior normal.        Thought Content: Thought content normal.        Judgment: Judgment normal.     Data: I have independently interpreted his bilateral upper extremity arterial duplex which demonstrates triphasic waveforms bilaterally at the brachial arteries right side 0.57 cm left 0.45 cm      I have also independently interpreted his bilateral extremity vein mapping which demonstrates suitable basilic and cephalic veins bilaterally.  Assessment/Plan:   Assessment   78 year old male with chronic kidney disease.  Plan is for right arm av fistula today in OR.     Yasha Tibbett C. Donzetta Matters, MD [facility] Office: [phone] Pager: [phone]

## 2018-11-26 NOTE — Progress Notes (Signed)
75 mcg of fentanyl wasted in the stericycle with Jake Bathe, RN at this time.

## 2018-11-26 NOTE — Transfer of Care (Signed)
Immediate Anesthesia Transfer of Care Note  Patient: Anthony Foster  Procedure(s) Performed:     Right arm Brachiocephalic Fistula Creation    (Right Arm Lower)  Patient Location: PACU  Anesthesia Type:MAC  Level of Consciousness: awake and patient cooperative  Airway & Oxygen Therapy: Patient Spontanous Breathing  Post-op Assessment: Report given to RN and Post -op Vital signs reviewed and stable  Post vital signs: Reviewed and stable  Last Vitals:  Vitals Value Taken Time  BP    Temp    Pulse    Resp    SpO2      Last Pain:  Vitals:   11/26/18 0923  TempSrc:   PainSc: 0-No pain      Patients Stated Pain Goal: 0 (42/39/53 2023)  Complications: No apparent anesthesia complications

## 2018-11-26 NOTE — Anesthesia Procedure Notes (Signed)
Procedure Name: MAC Date/Time: 11/26/2018 11:20 AM Performed by: Lance Coon, CRNA Pre-anesthesia Checklist: Patient identified, Emergency Drugs available, Suction available and Patient being monitored Patient Re-evaluated:Patient Re-evaluated prior to induction Oxygen Delivery Method: Simple face mask and Nasal cannula Preoxygenation: Pre-oxygenation with 100% oxygen Induction Type: IV induction Placement Confirmation: positive ETCO2 and breath sounds checked- equal and bilateral Dental Injury: Teeth and Oropharynx as per pre-operative assessment

## 2018-11-27 ENCOUNTER — Encounter (HOSPITAL_COMMUNITY): Payer: Self-pay | Admitting: Vascular Surgery

## 2018-12-30 DIAGNOSIS — Z720 Tobacco use: Secondary | ICD-10-CM | POA: Diagnosis not present

## 2018-12-30 DIAGNOSIS — N184 Chronic kidney disease, stage 4 (severe): Secondary | ICD-10-CM | POA: Diagnosis not present

## 2018-12-30 DIAGNOSIS — N2581 Secondary hyperparathyroidism of renal origin: Secondary | ICD-10-CM | POA: Diagnosis not present

## 2018-12-30 DIAGNOSIS — I129 Hypertensive chronic kidney disease with stage 1 through stage 4 chronic kidney disease, or unspecified chronic kidney disease: Secondary | ICD-10-CM | POA: Diagnosis not present

## 2018-12-30 DIAGNOSIS — D631 Anemia in chronic kidney disease: Secondary | ICD-10-CM | POA: Diagnosis not present

## 2018-12-30 DIAGNOSIS — Z6827 Body mass index (BMI) 27.0-27.9, adult: Secondary | ICD-10-CM | POA: Diagnosis not present

## 2019-01-04 ENCOUNTER — Other Ambulatory Visit: Payer: Self-pay

## 2019-01-04 DIAGNOSIS — N185 Chronic kidney disease, stage 5: Secondary | ICD-10-CM

## 2019-01-11 ENCOUNTER — Ambulatory Visit (INDEPENDENT_AMBULATORY_CARE_PROVIDER_SITE_OTHER): Payer: Self-pay | Admitting: Family

## 2019-01-11 ENCOUNTER — Ambulatory Visit (HOSPITAL_COMMUNITY): Payer: Medicare Other

## 2019-01-11 ENCOUNTER — Ambulatory Visit (HOSPITAL_COMMUNITY)
Admission: RE | Admit: 2019-01-11 | Discharge: 2019-01-11 | Disposition: A | Discharge status: Home / Self Care | Payer: Medicare Other | Source: Ambulatory Visit | Attending: Family | Admitting: Family

## 2019-01-11 ENCOUNTER — Other Ambulatory Visit: Payer: Self-pay

## 2019-01-11 ENCOUNTER — Encounter: Payer: Self-pay | Admitting: Family

## 2019-01-11 VITALS — BP 123/62 | HR 73 | Temp 97.5°F | Resp 18 | Ht 68.0 in | Wt 190.0 lb

## 2019-01-11 DIAGNOSIS — N185 Chronic kidney disease, stage 5: Secondary | ICD-10-CM | POA: Diagnosis not present

## 2019-01-11 DIAGNOSIS — I770 Arteriovenous fistula, acquired: Secondary | ICD-10-CM

## 2019-01-11 DIAGNOSIS — F17200 Nicotine dependence, unspecified, uncomplicated: Secondary | ICD-10-CM

## 2019-01-11 DIAGNOSIS — N184 Chronic kidney disease, stage 4 (severe): Secondary | ICD-10-CM

## 2019-01-11 NOTE — Patient Instructions (Signed)
Steps to Quit Smoking Smoking tobacco is the leading cause of preventable death. It can affect almost every organ in the body. Smoking puts you and people around you at risk for many serious, long-lasting (chronic) diseases. Quitting smoking can be hard, but it is one of the best things that you can do for your health. It is never too late to quit. How do I get ready to quit? When you decide to quit smoking, make a plan to help you succeed. Before you quit:  Pick a date to quit. Set a date within the next 2 weeks to give you time to prepare.  Write down the reasons why you are quitting. Keep this list in places where you will see it often.  Tell your family, friends, and co-workers that you are quitting. Their support is important.  Talk with your doctor about the choices that may help you quit.  Find out if your health insurance will pay for these treatments.  Know the people, places, things, and activities that make you want to smoke (triggers). Avoid them. What first steps can I take to quit smoking?  Throw away all cigarettes at home, at work, and in your car.  Throw away the things that you use when you smoke, such as ashtrays and lighters.  Clean your car. Make sure to empty the ashtray.  Clean your home, including curtains and carpets. What can I do to help me quit smoking? Talk with your doctor about taking medicines and seeing a counselor at the same time. You are more likely to succeed when you do both.  If you are pregnant or breastfeeding, talk with your doctor about counseling or other ways to quit smoking. Do not take medicine to help you quit smoking unless your doctor tells you to do so. To quit smoking: Quit right away  Quit smoking totally, instead of slowly cutting back on how much you smoke over a period of time.  Go to counseling. You are more likely to quit if you go to counseling sessions regularly. Take medicine You may take medicines to help you quit. Some  medicines need a prescription, and some you can buy over-the-counter. Some medicines may contain a drug called nicotine to replace the nicotine in cigarettes. Medicines may:  Help you to stop having the desire to smoke (cravings).  Help to stop the problems that come when you stop smoking (withdrawal symptoms). Your doctor may ask you to use:  Nicotine patches, gum, or lozenges.  Nicotine inhalers or sprays.  Non-nicotine medicine that is taken by mouth. Find resources Find resources and other ways to help you quit smoking and remain smoke-free after you quit. These resources are most helpful when you use them often. They include:  Online chats with a counselor.  Phone quitlines.  Printed self-help materials.  Support groups or group counseling.  Text messaging programs.  Mobile phone apps. Use apps on your mobile phone or tablet that can help you stick to your quit plan. There are many free apps for mobile phones and tablets as well as websites. Examples include Quit Guide from the CDC and smokefree.gov  What things can I do to make it easier to quit?   Talk to your family and friends. Ask them to support and encourage you.  Call a phone quitline (1-800-QUIT-NOW), reach out to support groups, or work with a counselor.  Ask people who smoke to not smoke around you.  Avoid places that make you want to smoke,   such as: ? Bars. ? Parties. ? Smoke-break areas at work.  Spend time with people who do not smoke.  Lower the stress in your life. Stress can make you want to smoke. Try these things to help your stress: ? Getting regular exercise. ? Doing deep-breathing exercises. ? Doing yoga. ? Meditating. ? Doing a body scan. To do this, close your eyes, focus on one area of your body at a time from head to toe. Notice which parts of your body are tense. Try to relax the muscles in those areas. How will I feel when I quit smoking? Day 1 to 3 weeks Within the first 24 hours,  you may start to have some problems that come from quitting tobacco. These problems are very bad 2-3 days after you quit, but they do not often last for more than 2-3 weeks. You may get these symptoms:  Mood swings.  Feeling restless, nervous, angry, or annoyed.  Trouble concentrating.  Dizziness.  Strong desire for high-sugar foods and nicotine.  Weight gain.  Trouble pooping (constipation).  Feeling like you may vomit (nausea).  Coughing or a sore throat.  Changes in how the medicines that you take for other issues work in your body.  Depression.  Trouble sleeping (insomnia). Week 3 and afterward After the first 2-3 weeks of quitting, you may start to notice more positive results, such as:  Better sense of smell and taste.  Less coughing and sore throat.  Slower heart rate.  Lower blood pressure.  Clearer skin.  Better breathing.  Fewer sick days. Quitting smoking can be hard. Do not give up if you fail the first time. Some people need to try a few times before they succeed. Do your best to stick to your quit plan, and talk with your doctor if you have any questions or concerns. Summary  Smoking tobacco is the leading cause of preventable death. Quitting smoking can be hard, but it is one of the best things that you can do for your health.  When you decide to quit smoking, make a plan to help you succeed.  Quit smoking right away, not slowly over a period of time.  When you start quitting, seek help from your doctor, family, or friends. This information is not intended to replace advice given to you by your health care provider. Make sure you discuss any questions you have with your health care provider. Document Released: 04/06/2009 Document Revised: 08/28/2018 Document Reviewed: 08/29/2018 Elsevier Patient Education  2020 [employer]  

## 2019-01-11 NOTE — Progress Notes (Signed)
CC: Post op follow up AVF creation  History of Present Illness  Anthony Foster is a 78 y.o. (12/14/1940) male who is s/p right arm brachiocephalic AV fistula creation on 11-26-18 by Dr. Donzetta Matters.  He denies any steal type symptoms in his right upper extremity.  He denies fever or chills.   Has has a defibrillator on the left.  He is right-hand dominant and is a bowler.  He is on Eliquis, has a history of atrial fib.  Has a previous right wrist surgery for trigger finger.  GFR was 19 on 11-26-18, stage 4 CKD.  His cardiologist has been following his small AAA, was 3.5 cm on November 2019 duplex.   He smokes 1/2 ppd, started in his 20's, quit 4-5x, for a total of about 15 years   Past Medical History:  Diagnosis Date  . AAA (abdominal aortic aneurysm) (HCC)    3.5 cm 04/2018  . Atrial fibrillation ([place])   . CHF (congestive heart failure) ([place])   . Coronary artery disease    Prior stent  . Degenerative joint disease   . Full dentures   . Gout   . Hypercholesteremia   . Hypertension   . 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 Ischemic cardiomyopathy   . Pneumonia   . Renal insufficiency   . Wears glasses     Social History Social History   Tobacco Use  . Smoking status: Current Some Day Smoker    Types: Cigars  . Smokeless tobacco: Never Used  Substance Use Topics  . Alcohol use: Not Currently    Alcohol/week: 0.0 standard drinks  . Drug use: Never    Family History Family History  Problem Relation Age of Onset  . Heart disease Other        No family history  . Diabetes Sister     Surgical History Past Surgical History:  Procedure Laterality Date  . AV FISTULA PLACEMENT Right 11/26/2018   Procedure:     Right arm Brachiocephalic Fistula Creation   ;  Surgeon: Waynetta Sandy, MD;  Location: [facility];  Service: Vascular;  Laterality: Right;  . BACK SURGERY    . CARDIAC SURGERY    . CATARACT EXTRACTION W/ INTRAOCULAR LENS   IMPLANT, BILATERAL    . COLONOSCOPY W/ BIOPSIES AND POLYPECTOMY    . MASS EXCISION     neck  . MULTIPLE TOOTH EXTRACTIONS    . PACEMAKER PLACEMENT    . SHOULDER SURGERY     left  . TOTAL SHOULDER REPLACEMENT    . WRIST SURGERY      Allergies  Allergen Reactions  . Hymenoptera Venom Preparations Hives    Reaction to wasps  . Lisinopril Other (See Comments)    angioedema  . Piroxicam Other (See Comments)    burn  . Shellfish Allergy Swelling  . Amoxicillin-Pot Clavulanate Rash    Did it involve swelling of the face/tongue/throat, SOB, or low BP? No Did it involve sudden or severe rash/hives, skin peeling, or any reaction on the inside of your mouth or nose? Yes Did you need to seek medical attention at a hospital or doctor's office? Yes When did it last happen?over 10 years If all above answers are "NO", may proceed with cephalosporin use.     Current Outpatient Medications  Medication Sig Dispense Refill  . allopurinol (ZYLOPRIM) 300 MG tablet Take 300 mg by mouth daily.  6  . apixaban (ELIQUIS) 5 MG TABS tablet Take  5 mg by mouth daily.    Marland Kitchen atorvastatin (LIPITOR) 80 MG tablet Take 1 tablet (80 mg total) by mouth daily. 30 tablet 6  . carvedilol (COREG) 25 MG tablet Take 25 mg by mouth daily.   1  . furosemide (LASIX) 40 MG tablet Take 40 mg by mouth daily.  3  . hydrALAZINE (APRESOLINE) 25 MG tablet Take 1 tablet (25 mg total) by mouth 3 (three) times daily. (Patient taking differently: Take 25 mg by mouth daily. ) 270 tablet 3  . isosorbide mononitrate (IMDUR) 60 MG 24 hr tablet Take 1 tablet (60 mg total) by mouth daily. 30 tablet 4  . oxyCODONE-acetaminophen (PERCOCET/ROXICET) 5-325 MG tablet Take 1 tablet by mouth every 6 (six) hours as needed. 10 tablet 0  . potassium chloride SA (K-DUR,KLOR-CON) 20 MEQ tablet Take 10 mEq by mouth daily.   6  . nitroGLYCERIN (NITROSTAT) 0.4 MG SL tablet Place 1 tablet (0.4 mg total) under the tongue every 5 (five) minutes as  needed for chest pain. 25 tablet 3   No current facility-administered medications for this visit.      REVIEW OF SYSTEMS: see HPI for pertinent positives and negatives    PHYSICAL EXAMINATION:  Vitals:   01/11/19 0929  BP: 123/62  Pulse: 73  Resp: 18  Temp: (!) 97.5 F (36.4 C)  TempSrc: Temporal  SpO2: 99%  Weight: 190 lb (86.2 kg)  Height: 5\' 8"  (1.727 m)   Body mass index is 28.89 kg/m.  General: The patient appears his stated age.   HEENT:  No gross abnormalities Pulmonary: Respirations are non-labored Musculoskeletal: There are no major deformities.   Neurologic: No focal weakness or paresthesias are detected Skin: There are no ulcer or rashes noted. Psychiatric: The patient has normal affect. Cardiovascular: Right upper arm AVF with strong palpable thrill and audible bruit.  Right radial pulse is 1+ palpable.   Non-Invasive Vascular Imaging  Right upper arm Access Duplex  (Date: 01/11/2019):  Findings: +--------------------+----------+-----------------+--------+ AVF                 PSV (cm/s)Flow Vol (mL/min)Comments +--------------------+----------+-----------------+--------+ Native artery inflow   271          1028                +--------------------+----------+-----------------+--------+ AVF Anastomosis        642                              +--------------------+----------+-----------------+--------+ +------------+----------+-------------+----------+-----------------------------+ OUTFLOW VEINPSV (cm/s)Diameter (cm)Depth (cm)          Describe            +------------+----------+-------------+----------+-----------------------------+ Shoulder       501        0.67        0.40      change in Diameter and                                                         Retained valve         +------------+----------+-------------+----------+-----------------------------+ Prox UA        116        0.94        0.25                                  +------------+----------+-------------+----------+-----------------------------+  Mid UA         198        0.86        0.23                                 +------------+----------+-------------+----------+-----------------------------+ Dist UA        176        0.73        0.37                                 +------------+----------+-------------+----------+-----------------------------+ AC Fossa       250        1.03        0.22   competing branch and Retained                                                          valve             +------------+----------+-------------+----------+-----------------------------+ Summary: Patent arteriovenous fistula.  Linear echogenic material noted within cephalic outflow vein. Audible whistle artifact noted within cephalic vein at shoulder.    Medical Decision Making  Anthony Foster is a 78 y.o. male is s/p right arm brachiocephalic AV fistula creation on 11-26-18 by Dr. Donzetta Matters.  His right AC incision is well healed.   He is stage 4 CKD, has not started HD.   I discussed tobacco cessation with him.    His AVF may be used after 02-26-19 if needed.   Follow up with [place] as needed.    Clemon Chambers, RN, MSN, FNP-C [facility] Office: [phone]  01/11/2019, 9:37 AM  Clinic MD: Donzetta Matters on call

## 2019-01-20 ENCOUNTER — Ambulatory Visit (INDEPENDENT_AMBULATORY_CARE_PROVIDER_SITE_OTHER): Payer: Medicare Other | Admitting: *Deleted

## 2019-01-20 DIAGNOSIS — I255 Ischemic cardiomyopathy: Secondary | ICD-10-CM

## 2019-01-20 DIAGNOSIS — I509 Heart failure, unspecified: Secondary | ICD-10-CM | POA: Diagnosis not present

## 2019-01-20 LAB — CUP PACEART REMOTE DEVICE CHECK
Battery Remaining Longevity: 25 mo
Battery Remaining Percentage: 32 %
Battery Voltage: 2.89 V
Brady Statistic AP VP Percent: 1 %
Brady Statistic AP VS Percent: 1.1 %
Brady Statistic AS VP Percent: 43 %
Brady Statistic AS VS Percent: 53 %
Brady Statistic RA Percent Paced: 1 %
Brady Statistic RV Percent Paced: 44 %
Date Time Interrogation Session: 20200729060016
HighPow Impedance: 44 Ohm
HighPow Impedance: 44 Ohm
Implantable Lead Implant Date: 20140206
Implantable Lead Implant Date: 20140206
Implantable Lead Location: 753859
Implantable Lead Location: 753860
Implantable Pulse Generator Implant Date: 20170206
Lead Channel Impedance Value: 340 Ohm
Lead Channel Impedance Value: 390 Ohm
Lead Channel Pacing Threshold Amplitude: 0.75 V
Lead Channel Pacing Threshold Amplitude: 3.25 V
Lead Channel Pacing Threshold Pulse Width: 0.5 ms
Lead Channel Pacing Threshold Pulse Width: 1 ms
Lead Channel Sensing Intrinsic Amplitude: 1 mV
Lead Channel Sensing Intrinsic Amplitude: 11.5 mV
Lead Channel Setting Pacing Amplitude: 1 V
Lead Channel Setting Pacing Amplitude: 5 V
Lead Channel Setting Pacing Pulse Width: 0.5 ms
Lead Channel Setting Sensing Sensitivity: 0.5 mV
Pulse Gen Serial Number: 7072725

## 2019-02-01 NOTE — Progress Notes (Signed)
Remote ICD transmission.   

## 2019-03-22 DIAGNOSIS — R7303 Prediabetes: Secondary | ICD-10-CM | POA: Diagnosis not present

## 2019-03-22 DIAGNOSIS — N184 Chronic kidney disease, stage 4 (severe): Secondary | ICD-10-CM | POA: Diagnosis not present

## 2019-03-22 DIAGNOSIS — I4891 Unspecified atrial fibrillation: Secondary | ICD-10-CM | POA: Diagnosis not present

## 2019-03-22 DIAGNOSIS — E7800 Pure hypercholesterolemia, unspecified: Secondary | ICD-10-CM | POA: Diagnosis not present

## 2019-03-22 DIAGNOSIS — R972 Elevated prostate specific antigen [PSA]: Secondary | ICD-10-CM | POA: Diagnosis not present

## 2019-03-22 DIAGNOSIS — J449 Chronic obstructive pulmonary disease, unspecified: Secondary | ICD-10-CM | POA: Diagnosis not present

## 2019-03-22 DIAGNOSIS — Z23 Encounter for immunization: Secondary | ICD-10-CM | POA: Diagnosis not present

## 2019-03-22 DIAGNOSIS — Z1389 Encounter for screening for other disorder: Secondary | ICD-10-CM | POA: Diagnosis not present

## 2019-03-22 DIAGNOSIS — Z0000 Encounter for general adult medical examination without abnormal findings: Secondary | ICD-10-CM | POA: Diagnosis not present

## 2019-03-22 DIAGNOSIS — M109 Gout, unspecified: Secondary | ICD-10-CM | POA: Diagnosis not present

## 2019-03-22 DIAGNOSIS — I10 Essential (primary) hypertension: Secondary | ICD-10-CM | POA: Diagnosis not present

## 2019-03-22 DIAGNOSIS — I509 Heart failure, unspecified: Secondary | ICD-10-CM | POA: Diagnosis not present

## 2019-04-20 DIAGNOSIS — I129 Hypertensive chronic kidney disease with stage 1 through stage 4 chronic kidney disease, or unspecified chronic kidney disease: Secondary | ICD-10-CM | POA: Diagnosis not present

## 2019-04-20 DIAGNOSIS — Z720 Tobacco use: Secondary | ICD-10-CM | POA: Diagnosis not present

## 2019-04-20 DIAGNOSIS — N2581 Secondary hyperparathyroidism of renal origin: Secondary | ICD-10-CM | POA: Diagnosis not present

## 2019-04-20 DIAGNOSIS — Z6827 Body mass index (BMI) 27.0-27.9, adult: Secondary | ICD-10-CM | POA: Diagnosis not present

## 2019-04-20 DIAGNOSIS — N184 Chronic kidney disease, stage 4 (severe): Secondary | ICD-10-CM | POA: Diagnosis not present

## 2019-04-20 DIAGNOSIS — D631 Anemia in chronic kidney disease: Secondary | ICD-10-CM | POA: Diagnosis not present

## 2019-04-21 LAB — CUP PACEART REMOTE DEVICE CHECK
Battery Remaining Longevity: 25 mo
Battery Remaining Percentage: 31 %
Battery Voltage: 2.89 V
Brady Statistic AP VP Percent: 1 %
Brady Statistic AP VS Percent: 1.2 %
Brady Statistic AS VP Percent: 40 %
Brady Statistic AS VS Percent: 56 %
Brady Statistic RA Percent Paced: 1 %
Brady Statistic RV Percent Paced: 41 %
Date Time Interrogation Session: 20201028060017
HighPow Impedance: 41 Ohm
HighPow Impedance: 41 Ohm
Implantable Lead Implant Date: 20140206
Implantable Lead Implant Date: 20140206
Implantable Lead Location: 753859
Implantable Lead Location: 753860
Implantable Pulse Generator Implant Date: 20170206
Lead Channel Impedance Value: 360 Ohm
Lead Channel Impedance Value: 410 Ohm
Lead Channel Pacing Threshold Amplitude: 1 V
Lead Channel Pacing Threshold Amplitude: 3.25 V
Lead Channel Pacing Threshold Pulse Width: 0.5 ms
Lead Channel Pacing Threshold Pulse Width: 1 ms
Lead Channel Sensing Intrinsic Amplitude: 1.8 mV
Lead Channel Sensing Intrinsic Amplitude: 11.5 mV
Lead Channel Setting Pacing Amplitude: 1.25 V
Lead Channel Setting Pacing Amplitude: 5 V
Lead Channel Setting Pacing Pulse Width: 0.5 ms
Lead Channel Setting Sensing Sensitivity: 0.5 mV
Pulse Gen Serial Number: 7072725

## 2019-04-22 ENCOUNTER — Ambulatory Visit (INDEPENDENT_AMBULATORY_CARE_PROVIDER_SITE_OTHER): Payer: Medicare Other | Admitting: *Deleted

## 2019-04-22 DIAGNOSIS — I509 Heart failure, unspecified: Secondary | ICD-10-CM

## 2019-04-22 DIAGNOSIS — I255 Ischemic cardiomyopathy: Secondary | ICD-10-CM

## 2019-05-16 NOTE — Progress Notes (Signed)
Remote ICD transmission.   

## 2019-05-19 ENCOUNTER — Other Ambulatory Visit: Payer: Self-pay

## 2019-05-19 ENCOUNTER — Ambulatory Visit (HOSPITAL_COMMUNITY)
Admission: RE | Admit: 2019-05-19 | Discharge: 2019-05-19 | Disposition: A | Discharge status: Home / Self Care | Payer: Medicare Other | Source: Ambulatory Visit | Attending: Cardiovascular Disease | Admitting: Cardiovascular Disease

## 2019-05-19 ENCOUNTER — Other Ambulatory Visit: Payer: Self-pay | Admitting: Cardiology

## 2019-05-19 DIAGNOSIS — I714 Abdominal aortic aneurysm, without rupture, unspecified: Secondary | ICD-10-CM

## 2019-05-24 ENCOUNTER — Encounter: Payer: Self-pay | Admitting: *Deleted

## 2019-05-24 ENCOUNTER — Other Ambulatory Visit: Payer: Self-pay | Admitting: *Deleted

## 2019-05-24 DIAGNOSIS — I714 Abdominal aortic aneurysm, without rupture, unspecified: Secondary | ICD-10-CM

## 2019-05-26 NOTE — Progress Notes (Signed)
HPI: FU CAD and congestive heart failure. Previously cared for in [facility]. Patient was admitted in January 2017 with congestive heart failure. It was noted he had a history of atrial fibrillation, coronary artery disease, ischemic cardiomyopathy with ejection fraction 35-40%. Echocardiogram April 2017 showed ejection fraction 78-29%, grade 2 diastolic dysfunction, moderate left atrial enlargement and mild tricuspid regurgitation.Nuclear study July 2019 showed ejection fraction 40%, inferior scar versus attenuation and increased pulmonary uptake. Abdominal ultrasound November 2020 showed 3.8 cm abdominal aortic aneurysm. Since last seen,patient denies dyspnea, chest pain, palpitations or syncope.  Current Outpatient Medications  Medication Sig Dispense Refill  . allopurinol (ZYLOPRIM) 300 MG tablet Take 300 mg by mouth daily.  6  . apixaban (ELIQUIS) 5 MG TABS tablet Take 5 mg by mouth daily.    Marland Kitchen atorvastatin (LIPITOR) 80 MG tablet Take 1 tablet (80 mg total) by mouth daily. 30 tablet 6  . carvedilol (COREG) 25 MG tablet Take 25 mg by mouth daily.   1  . furosemide (LASIX) 40 MG tablet Take 40 mg by mouth daily.  3  . hydrALAZINE (APRESOLINE) 25 MG tablet Take 1 tablet (25 mg total) by mouth 3 (three) times daily. (Patient taking differently: Take 25 mg by mouth daily. ) 270 tablet 3  . isosorbide mononitrate (IMDUR) 60 MG 24 hr tablet Take 1 tablet (60 mg total) by mouth daily. 30 tablet 4  . oxyCODONE-acetaminophen (PERCOCET/ROXICET) 5-325 MG tablet Take 1 tablet by mouth every 6 (six) hours as needed. 10 tablet 0  . potassium chloride SA (K-DUR,KLOR-CON) 20 MEQ tablet Take 10 mEq by mouth daily.   6  . nitroGLYCERIN (NITROSTAT) 0.4 MG SL tablet Place 1 tablet (0.4 mg total) under the tongue every 5 (five) minutes as needed for chest pain. 25 tablet 3   No current facility-administered medications for this visit.     Past Medical History:  Diagnosis Date  . AAA  (abdominal aortic aneurysm) (HCC)    3.5 cm 04/2018  . Atrial fibrillation ([place])   . CHF (congestive heart failure) ([place])   . Coronary artery disease    Prior stent  . Degenerative joint disease   . Full dentures   . Gout   . Hypercholesteremia   . Hypertension   . 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 Ischemic cardiomyopathy   . Pneumonia   . Renal insufficiency   . Wears glasses     Past Surgical History:  Procedure Laterality Date  . AV FISTULA PLACEMENT Right 11/26/2018   Procedure:     Right arm Brachiocephalic Fistula Creation   ;  Surgeon: Waynetta Sandy, MD;  Location: [place];  Service: Vascular;  Laterality: Right;  . BACK SURGERY    . CARDIAC SURGERY    . CATARACT EXTRACTION W/ INTRAOCULAR LENS  IMPLANT, BILATERAL    . COLONOSCOPY W/ BIOPSIES AND POLYPECTOMY    . MASS EXCISION     neck  . MULTIPLE TOOTH EXTRACTIONS    . PACEMAKER PLACEMENT    . SHOULDER SURGERY     left  . TOTAL SHOULDER REPLACEMENT    . WRIST SURGERY      Social History   Socioeconomic History  . Marital status: Divorced    Spouse name: Not on file  . Number of children: 4  . Years of education: Not on file  . Highest education level: Not on file  Occupational History  . Not on file  Tobacco Use  .  Smoking status: Current Some Day Smoker    Types: Cigars  . Smokeless tobacco: Never Used  Substance and Sexual Activity  . Alcohol use: Not Currently    Alcohol/week: 0.0 standard drinks  . Drug use: Never  . Sexual activity: Not on file  Other Topics Concern  . Not on file  Social History Narrative  . Not on file   Social Determinants of Health   Financial Resource Strain:   . Difficulty of Paying Living Expenses: Not on file  Food Insecurity:   . Worried About Charity fundraiser in the Last Year: Not on file  . Ran Out of Food in the Last Year: Not on file  Transportation Needs:   . Lack of Transportation (Medical): Not on file  . Lack  of Transportation (Non-Medical): Not on file  Physical Activity:   . Days of Exercise per Week: Not on file  . Minutes of Exercise per Session: Not on file  Stress:   . Feeling of Stress : Not on file  Social Connections:   . Frequency of Communication with Friends and Family: Not on file  . Frequency of Social Gatherings with Friends and Family: Not on file  . Attends Religious Services: Not on file  . Active Member of Clubs or Organizations: Not on file  . Attends Archivist Meetings: Not on file  . Marital Status: Not on file  Intimate Partner Violence:   . Fear of Current or Ex-Partner: Not on file  . Emotionally Abused: Not on file  . Physically Abused: Not on file  . Sexually Abused: Not on file    Family History  Problem Relation Age of Onset  . Heart disease Other        No family history  . Diabetes Sister     ROS: no fevers or chills, productive cough, hemoptysis, dysphasia, odynophagia, melena, hematochezia, dysuria, hematuria, rash, seizure activity, orthopnea, PND, pedal edema, claudication. Remaining systems are negative.  Physical Exam: Well-developed well-nourished in no acute distress.  Skin is warm and dry.  HEENT is normal.  Neck is supple.  Chest is clear to auscultation with normal expansion.  Cardiovascular exam is regular rate and rhythm.  2/6 systolic murmur left sternal border. Abdominal exam nontender or distended. No masses palpated. Extremities show no edema.  AV fistula right upper extremity neuro grossly intact  ECG-sinus rhythm with first-degree AV block, left axis deviation, nonspecific ST changes.  Personally reviewed  A/P  1 ischemic cardiomyopathy-continue beta-blocker, hydralazine and nitrates.  He is not on Entresto or ARB given severity of renal insufficiency.  2 coronary artery disease-plan to continue medical therapy with statin.  He is not on aspirin given need for anticoagulation.  3 paroxysmal atrial  fibrillation-patient remains in sinus rhythm.  Continue beta-blocker and apixaban (patient is now 78 years old and creatinine greater than 1.5; decrease apixaban to 2.5 mg twice daily).  4 history of abdominal aortic aneurysm-patient will need follow-up ultrasound November 2021.  5 prior ICD-managed by electrophysiology.  6 hyperlipidemia-continue statin.  7 hypertension-blood pressure borderline but typically controlled at home by his report.  Continue present medications and follow.  8 renal insufficiency-monitored by nephrology.  AV fistula now in place.  9 tobacco abuse-patient has been counseled on discontinuing.  Kirk Ruths, MD

## 2019-06-04 ENCOUNTER — Other Ambulatory Visit: Payer: Self-pay

## 2019-06-04 ENCOUNTER — Ambulatory Visit (INDEPENDENT_AMBULATORY_CARE_PROVIDER_SITE_OTHER): Payer: Medicare Other | Admitting: Cardiology

## 2019-06-04 ENCOUNTER — Encounter: Payer: Self-pay | Admitting: Cardiology

## 2019-06-04 VITALS — BP 140/64 | HR 77 | Ht 68.0 in | Wt 190.2 lb

## 2019-06-04 DIAGNOSIS — I255 Ischemic cardiomyopathy: Secondary | ICD-10-CM | POA: Diagnosis not present

## 2019-06-04 DIAGNOSIS — I2510 Atherosclerotic heart disease of native coronary artery without angina pectoris: Secondary | ICD-10-CM

## 2019-06-04 DIAGNOSIS — I10 Essential (primary) hypertension: Secondary | ICD-10-CM | POA: Diagnosis not present

## 2019-06-04 MED ORDER — APIXABAN 2.5 MG PO TABS: 2.5000 mg | ORAL_TABLET | Freq: Every day | ORAL | 3 refills | Status: DC

## 2019-06-04 NOTE — Patient Instructions (Signed)
Medication Instructions:  DECREASE ELIQUIS TO 2.5 MG TWICE DAILY= 1/2 OF 5 MG TABLET TWICE DAILY  *If you need a refill on your cardiac medications before your next appointment, please call your pharmacy*  Lab Work: If you have labs (blood work) drawn today and your tests are completely normal, you will receive your results only by: Marland Kitchen MyChart Message (if you have MyChart) OR . A paper copy in the mail If you have any lab test that is abnormal or we need to change your treatment, we will call you to review the results.  Follow-Up: At [facility], you and your health needs are our priority.  As part of our continuing mission to provide you with exceptional heart care, we have created designated Provider Care Teams.  These Care Teams include your primary Cardiologist (physician) and Advanced Practice Providers (APPs -  Physician Assistants and Nurse Practitioners) who all work together to provide you with the care you need, when you need it.  Your next appointment:   12 month(s)  The format for your next appointment:   Either In Person or Virtual  Provider:   You may see Kirk Ruths, MD or one of the following Advanced Practice Providers on your designated Care Team:    Kerin Ransom, PA-C  [place], [place]  Coletta Memos, [place]

## 2019-06-10 ENCOUNTER — Other Ambulatory Visit: Payer: Self-pay

## 2019-06-10 MED ORDER — APIXABAN 2.5 MG PO TABS: 2.5000 mg | ORAL_TABLET | Freq: Every day | ORAL | 3 refills | Status: DC

## 2019-07-14 DIAGNOSIS — I129 Hypertensive chronic kidney disease with stage 1 through stage 4 chronic kidney disease, or unspecified chronic kidney disease: Secondary | ICD-10-CM | POA: Diagnosis not present

## 2019-07-14 DIAGNOSIS — Z6827 Body mass index (BMI) 27.0-27.9, adult: Secondary | ICD-10-CM | POA: Diagnosis not present

## 2019-07-14 DIAGNOSIS — N2581 Secondary hyperparathyroidism of renal origin: Secondary | ICD-10-CM | POA: Diagnosis not present

## 2019-07-14 DIAGNOSIS — Z720 Tobacco use: Secondary | ICD-10-CM | POA: Diagnosis not present

## 2019-07-14 DIAGNOSIS — N189 Chronic kidney disease, unspecified: Secondary | ICD-10-CM | POA: Diagnosis not present

## 2019-07-14 DIAGNOSIS — N184 Chronic kidney disease, stage 4 (severe): Secondary | ICD-10-CM | POA: Diagnosis not present

## 2019-07-14 DIAGNOSIS — D631 Anemia in chronic kidney disease: Secondary | ICD-10-CM | POA: Diagnosis not present

## 2019-07-22 ENCOUNTER — Ambulatory Visit (INDEPENDENT_AMBULATORY_CARE_PROVIDER_SITE_OTHER): Payer: Medicare Other | Admitting: *Deleted

## 2019-07-22 DIAGNOSIS — I255 Ischemic cardiomyopathy: Secondary | ICD-10-CM

## 2019-07-22 LAB — CUP PACEART REMOTE DEVICE CHECK
Battery Remaining Longevity: 23 mo
Battery Remaining Percentage: 30 %
Battery Voltage: 2.86 V
Brady Statistic AP VP Percent: 1 %
Brady Statistic AP VS Percent: 1.2 %
Brady Statistic AS VP Percent: 41 %
Brady Statistic AS VS Percent: 55 %
Brady Statistic RA Percent Paced: 1 %
Brady Statistic RV Percent Paced: 42 %
Date Time Interrogation Session: 20210128040021
HighPow Impedance: 43 Ohm
HighPow Impedance: 43 Ohm
Implantable Lead Implant Date: 20140206
Implantable Lead Implant Date: 20140206
Implantable Lead Location: 753859
Implantable Lead Location: 753860
Implantable Pulse Generator Implant Date: 20170206
Lead Channel Impedance Value: 340 Ohm
Lead Channel Impedance Value: 380 Ohm
Lead Channel Pacing Threshold Amplitude: 0.875 V
Lead Channel Pacing Threshold Amplitude: 3.25 V
Lead Channel Pacing Threshold Pulse Width: 0.5 ms
Lead Channel Pacing Threshold Pulse Width: 1 ms
Lead Channel Sensing Intrinsic Amplitude: 1 mV
Lead Channel Sensing Intrinsic Amplitude: 9.8 mV
Lead Channel Setting Pacing Amplitude: 1.125
Lead Channel Setting Pacing Amplitude: 5 V
Lead Channel Setting Pacing Pulse Width: 0.5 ms
Lead Channel Setting Sensing Sensitivity: 0.5 mV
Pulse Gen Serial Number: 7072725

## 2019-07-22 NOTE — Progress Notes (Signed)
ICD Remote  

## 2019-08-21 DIAGNOSIS — Z23 Encounter for immunization: Secondary | ICD-10-CM | POA: Diagnosis not present

## 2019-09-14 DIAGNOSIS — F17200 Nicotine dependence, unspecified, uncomplicated: Secondary | ICD-10-CM | POA: Diagnosis not present

## 2019-09-14 DIAGNOSIS — M109 Gout, unspecified: Secondary | ICD-10-CM | POA: Diagnosis not present

## 2019-09-14 DIAGNOSIS — Z95810 Presence of automatic (implantable) cardiac defibrillator: Secondary | ICD-10-CM | POA: Diagnosis not present

## 2019-09-14 DIAGNOSIS — J449 Chronic obstructive pulmonary disease, unspecified: Secondary | ICD-10-CM | POA: Diagnosis not present

## 2019-09-14 DIAGNOSIS — I509 Heart failure, unspecified: Secondary | ICD-10-CM | POA: Diagnosis not present

## 2019-09-14 DIAGNOSIS — E559 Vitamin D deficiency, unspecified: Secondary | ICD-10-CM | POA: Diagnosis not present

## 2019-09-14 DIAGNOSIS — I4891 Unspecified atrial fibrillation: Secondary | ICD-10-CM | POA: Diagnosis not present

## 2019-09-14 DIAGNOSIS — I10 Essential (primary) hypertension: Secondary | ICD-10-CM | POA: Diagnosis not present

## 2019-09-18 DIAGNOSIS — Z23 Encounter for immunization: Secondary | ICD-10-CM | POA: Diagnosis not present

## 2019-10-20 DIAGNOSIS — Z6827 Body mass index (BMI) 27.0-27.9, adult: Secondary | ICD-10-CM | POA: Diagnosis not present

## 2019-10-20 DIAGNOSIS — I129 Hypertensive chronic kidney disease with stage 1 through stage 4 chronic kidney disease, or unspecified chronic kidney disease: Secondary | ICD-10-CM | POA: Diagnosis not present

## 2019-10-20 DIAGNOSIS — Z720 Tobacco use: Secondary | ICD-10-CM | POA: Diagnosis not present

## 2019-10-20 DIAGNOSIS — N2581 Secondary hyperparathyroidism of renal origin: Secondary | ICD-10-CM | POA: Diagnosis not present

## 2019-10-20 DIAGNOSIS — N189 Chronic kidney disease, unspecified: Secondary | ICD-10-CM | POA: Diagnosis not present

## 2019-10-20 DIAGNOSIS — D631 Anemia in chronic kidney disease: Secondary | ICD-10-CM | POA: Diagnosis not present

## 2019-10-20 DIAGNOSIS — N184 Chronic kidney disease, stage 4 (severe): Secondary | ICD-10-CM | POA: Diagnosis not present

## 2019-10-21 ENCOUNTER — Ambulatory Visit (INDEPENDENT_AMBULATORY_CARE_PROVIDER_SITE_OTHER): Payer: Medicare Other | Admitting: *Deleted

## 2019-10-21 DIAGNOSIS — I255 Ischemic cardiomyopathy: Secondary | ICD-10-CM

## 2019-10-21 LAB — CUP PACEART REMOTE DEVICE CHECK
Battery Remaining Longevity: 23 mo
Battery Remaining Percentage: 28 %
Battery Voltage: 2.86 V
Brady Statistic AP VP Percent: 1 %
Brady Statistic AP VS Percent: 1.2 %
Brady Statistic AS VP Percent: 42 %
Brady Statistic AS VS Percent: 54 %
Brady Statistic RA Percent Paced: 1 %
Brady Statistic RV Percent Paced: 43 %
Date Time Interrogation Session: 20210429040021
HighPow Impedance: 44 Ohm
HighPow Impedance: 44 Ohm
Implantable Lead Implant Date: 20140206
Implantable Lead Implant Date: 20140206
Implantable Lead Location: 753859
Implantable Lead Location: 753860
Implantable Pulse Generator Implant Date: 20170206
Lead Channel Impedance Value: 350 Ohm
Lead Channel Impedance Value: 410 Ohm
Lead Channel Pacing Threshold Amplitude: 0.875 V
Lead Channel Pacing Threshold Amplitude: 3.25 V
Lead Channel Pacing Threshold Pulse Width: 0.5 ms
Lead Channel Pacing Threshold Pulse Width: 1 ms
Lead Channel Sensing Intrinsic Amplitude: 1.3 mV
Lead Channel Sensing Intrinsic Amplitude: 8.9 mV
Lead Channel Setting Pacing Amplitude: 1.125
Lead Channel Setting Pacing Amplitude: 5 V
Lead Channel Setting Pacing Pulse Width: 0.5 ms
Lead Channel Setting Sensing Sensitivity: 0.5 mV
Pulse Gen Serial Number: 7072725

## 2019-10-22 NOTE — Progress Notes (Signed)
ICD Remote  

## 2020-01-20 ENCOUNTER — Ambulatory Visit (INDEPENDENT_AMBULATORY_CARE_PROVIDER_SITE_OTHER): Payer: Medicare Other | Admitting: *Deleted

## 2020-01-20 DIAGNOSIS — I255 Ischemic cardiomyopathy: Secondary | ICD-10-CM | POA: Diagnosis not present

## 2020-01-20 LAB — CUP PACEART REMOTE DEVICE CHECK
Battery Remaining Longevity: 22 mo
Battery Remaining Percentage: 28 %
Battery Voltage: 2.84 V
Brady Statistic AP VP Percent: 1 %
Brady Statistic AP VS Percent: 1.1 %
Brady Statistic AS VP Percent: 44 %
Brady Statistic AS VS Percent: 53 %
Brady Statistic RA Percent Paced: 1 %
Brady Statistic RV Percent Paced: 44 %
Date Time Interrogation Session: 20210729040017
HighPow Impedance: 50 Ohm
HighPow Impedance: 51 Ohm
Implantable Lead Implant Date: 20140206
Implantable Lead Implant Date: 20140206
Implantable Lead Location: 753859
Implantable Lead Location: 753860
Implantable Pulse Generator Implant Date: 20170206
Lead Channel Impedance Value: 360 Ohm
Lead Channel Impedance Value: 430 Ohm
Lead Channel Pacing Threshold Amplitude: 0.875 V
Lead Channel Pacing Threshold Amplitude: 3.25 V
Lead Channel Pacing Threshold Pulse Width: 0.5 ms
Lead Channel Pacing Threshold Pulse Width: 1 ms
Lead Channel Sensing Intrinsic Amplitude: 11.5 mV
Lead Channel Sensing Intrinsic Amplitude: 2.1 mV
Lead Channel Setting Pacing Amplitude: 1.125
Lead Channel Setting Pacing Amplitude: 5 V
Lead Channel Setting Pacing Pulse Width: 0.5 ms
Lead Channel Setting Sensing Sensitivity: 0.5 mV
Pulse Gen Serial Number: 7072725

## 2020-01-21 DIAGNOSIS — N2581 Secondary hyperparathyroidism of renal origin: Secondary | ICD-10-CM | POA: Diagnosis not present

## 2020-01-21 DIAGNOSIS — N189 Chronic kidney disease, unspecified: Secondary | ICD-10-CM | POA: Diagnosis not present

## 2020-01-21 DIAGNOSIS — I770 Arteriovenous fistula, acquired: Secondary | ICD-10-CM | POA: Diagnosis not present

## 2020-01-21 DIAGNOSIS — D631 Anemia in chronic kidney disease: Secondary | ICD-10-CM | POA: Diagnosis not present

## 2020-01-21 DIAGNOSIS — Z720 Tobacco use: Secondary | ICD-10-CM | POA: Diagnosis not present

## 2020-01-21 DIAGNOSIS — N184 Chronic kidney disease, stage 4 (severe): Secondary | ICD-10-CM | POA: Diagnosis not present

## 2020-01-21 DIAGNOSIS — I129 Hypertensive chronic kidney disease with stage 1 through stage 4 chronic kidney disease, or unspecified chronic kidney disease: Secondary | ICD-10-CM | POA: Diagnosis not present

## 2020-01-24 NOTE — Progress Notes (Signed)
Remote ICD transmission.   

## 2020-03-17 DIAGNOSIS — Z23 Encounter for immunization: Secondary | ICD-10-CM | POA: Diagnosis not present

## 2020-03-30 DIAGNOSIS — R7309 Other abnormal glucose: Secondary | ICD-10-CM | POA: Diagnosis not present

## 2020-03-30 DIAGNOSIS — Z0000 Encounter for general adult medical examination without abnormal findings: Secondary | ICD-10-CM | POA: Diagnosis not present

## 2020-03-30 DIAGNOSIS — Z95810 Presence of automatic (implantable) cardiac defibrillator: Secondary | ICD-10-CM | POA: Diagnosis not present

## 2020-03-30 DIAGNOSIS — N184 Chronic kidney disease, stage 4 (severe): Secondary | ICD-10-CM | POA: Diagnosis not present

## 2020-03-30 DIAGNOSIS — J449 Chronic obstructive pulmonary disease, unspecified: Secondary | ICD-10-CM | POA: Diagnosis not present

## 2020-03-30 DIAGNOSIS — M109 Gout, unspecified: Secondary | ICD-10-CM | POA: Diagnosis not present

## 2020-03-30 DIAGNOSIS — Z23 Encounter for immunization: Secondary | ICD-10-CM | POA: Diagnosis not present

## 2020-03-30 DIAGNOSIS — Z1389 Encounter for screening for other disorder: Secondary | ICD-10-CM | POA: Diagnosis not present

## 2020-03-30 DIAGNOSIS — E7800 Pure hypercholesterolemia, unspecified: Secondary | ICD-10-CM | POA: Diagnosis not present

## 2020-03-30 DIAGNOSIS — F17210 Nicotine dependence, cigarettes, uncomplicated: Secondary | ICD-10-CM | POA: Diagnosis not present

## 2020-03-30 DIAGNOSIS — I509 Heart failure, unspecified: Secondary | ICD-10-CM | POA: Diagnosis not present

## 2020-03-30 DIAGNOSIS — I4891 Unspecified atrial fibrillation: Secondary | ICD-10-CM | POA: Diagnosis not present

## 2020-03-30 DIAGNOSIS — I10 Essential (primary) hypertension: Secondary | ICD-10-CM | POA: Diagnosis not present

## 2020-04-20 ENCOUNTER — Ambulatory Visit (INDEPENDENT_AMBULATORY_CARE_PROVIDER_SITE_OTHER): Payer: Medicare Other

## 2020-04-20 DIAGNOSIS — I5022 Chronic systolic (congestive) heart failure: Secondary | ICD-10-CM | POA: Diagnosis not present

## 2020-04-20 LAB — CUP PACEART REMOTE DEVICE CHECK
Battery Remaining Longevity: 21 mo
Battery Remaining Percentage: 27 %
Battery Voltage: 2.83 V
Brady Statistic AP VP Percent: 1 %
Brady Statistic AP VS Percent: 1 %
Brady Statistic AS VP Percent: 44 %
Brady Statistic AS VS Percent: 53 %
Brady Statistic RA Percent Paced: 1 %
Brady Statistic RV Percent Paced: 44 %
Date Time Interrogation Session: 20211028040017
HighPow Impedance: 52 Ohm
HighPow Impedance: 52 Ohm
Implantable Lead Implant Date: 20140206
Implantable Lead Implant Date: 20140206
Implantable Lead Location: 753859
Implantable Lead Location: 753860
Implantable Pulse Generator Implant Date: 20170206
Lead Channel Impedance Value: 380 Ohm
Lead Channel Impedance Value: 440 Ohm
Lead Channel Pacing Threshold Amplitude: 0.875 V
Lead Channel Pacing Threshold Amplitude: 3.25 V
Lead Channel Pacing Threshold Pulse Width: 0.5 ms
Lead Channel Pacing Threshold Pulse Width: 1 ms
Lead Channel Sensing Intrinsic Amplitude: 1.3 mV
Lead Channel Sensing Intrinsic Amplitude: 9.7 mV
Lead Channel Setting Pacing Amplitude: 1.125
Lead Channel Setting Pacing Amplitude: 5 V
Lead Channel Setting Pacing Pulse Width: 0.5 ms
Lead Channel Setting Sensing Sensitivity: 0.5 mV
Pulse Gen Serial Number: 7072725

## 2020-04-25 NOTE — Progress Notes (Signed)
Remote ICD transmission.   

## 2020-04-27 DIAGNOSIS — N189 Chronic kidney disease, unspecified: Secondary | ICD-10-CM | POA: Diagnosis not present

## 2020-04-27 DIAGNOSIS — Z6827 Body mass index (BMI) 27.0-27.9, adult: Secondary | ICD-10-CM | POA: Diagnosis not present

## 2020-04-27 DIAGNOSIS — N184 Chronic kidney disease, stage 4 (severe): Secondary | ICD-10-CM | POA: Diagnosis not present

## 2020-04-27 DIAGNOSIS — D631 Anemia in chronic kidney disease: Secondary | ICD-10-CM | POA: Diagnosis not present

## 2020-04-27 DIAGNOSIS — I129 Hypertensive chronic kidney disease with stage 1 through stage 4 chronic kidney disease, or unspecified chronic kidney disease: Secondary | ICD-10-CM | POA: Diagnosis not present

## 2020-04-27 DIAGNOSIS — Z720 Tobacco use: Secondary | ICD-10-CM | POA: Diagnosis not present

## 2020-04-27 DIAGNOSIS — N2581 Secondary hyperparathyroidism of renal origin: Secondary | ICD-10-CM | POA: Diagnosis not present

## 2020-05-23 ENCOUNTER — Ambulatory Visit (HOSPITAL_COMMUNITY)
Admission: RE | Admit: 2020-05-23 | Discharge: 2020-05-23 | Disposition: A | Discharge status: Home / Self Care | Payer: Medicare Other | Source: Ambulatory Visit | Attending: Internal Medicine | Admitting: Internal Medicine

## 2020-05-23 ENCOUNTER — Other Ambulatory Visit: Payer: Self-pay | Admitting: Cardiology

## 2020-05-23 ENCOUNTER — Encounter: Payer: Self-pay | Admitting: *Deleted

## 2020-05-23 ENCOUNTER — Other Ambulatory Visit: Payer: Self-pay

## 2020-05-23 DIAGNOSIS — I714 Abdominal aortic aneurysm, without rupture, unspecified: Secondary | ICD-10-CM

## 2020-06-10 IMAGING — DX DG CHEST 2V
2 series · 2 of 2 positions shown · non-contrast
Comparison: Chest radiograph dated 05/01/2016

CLINICAL DATA: 68-year-old male with chest pain.

EXAM:
CHEST - 2 VIEW

[chest pa]
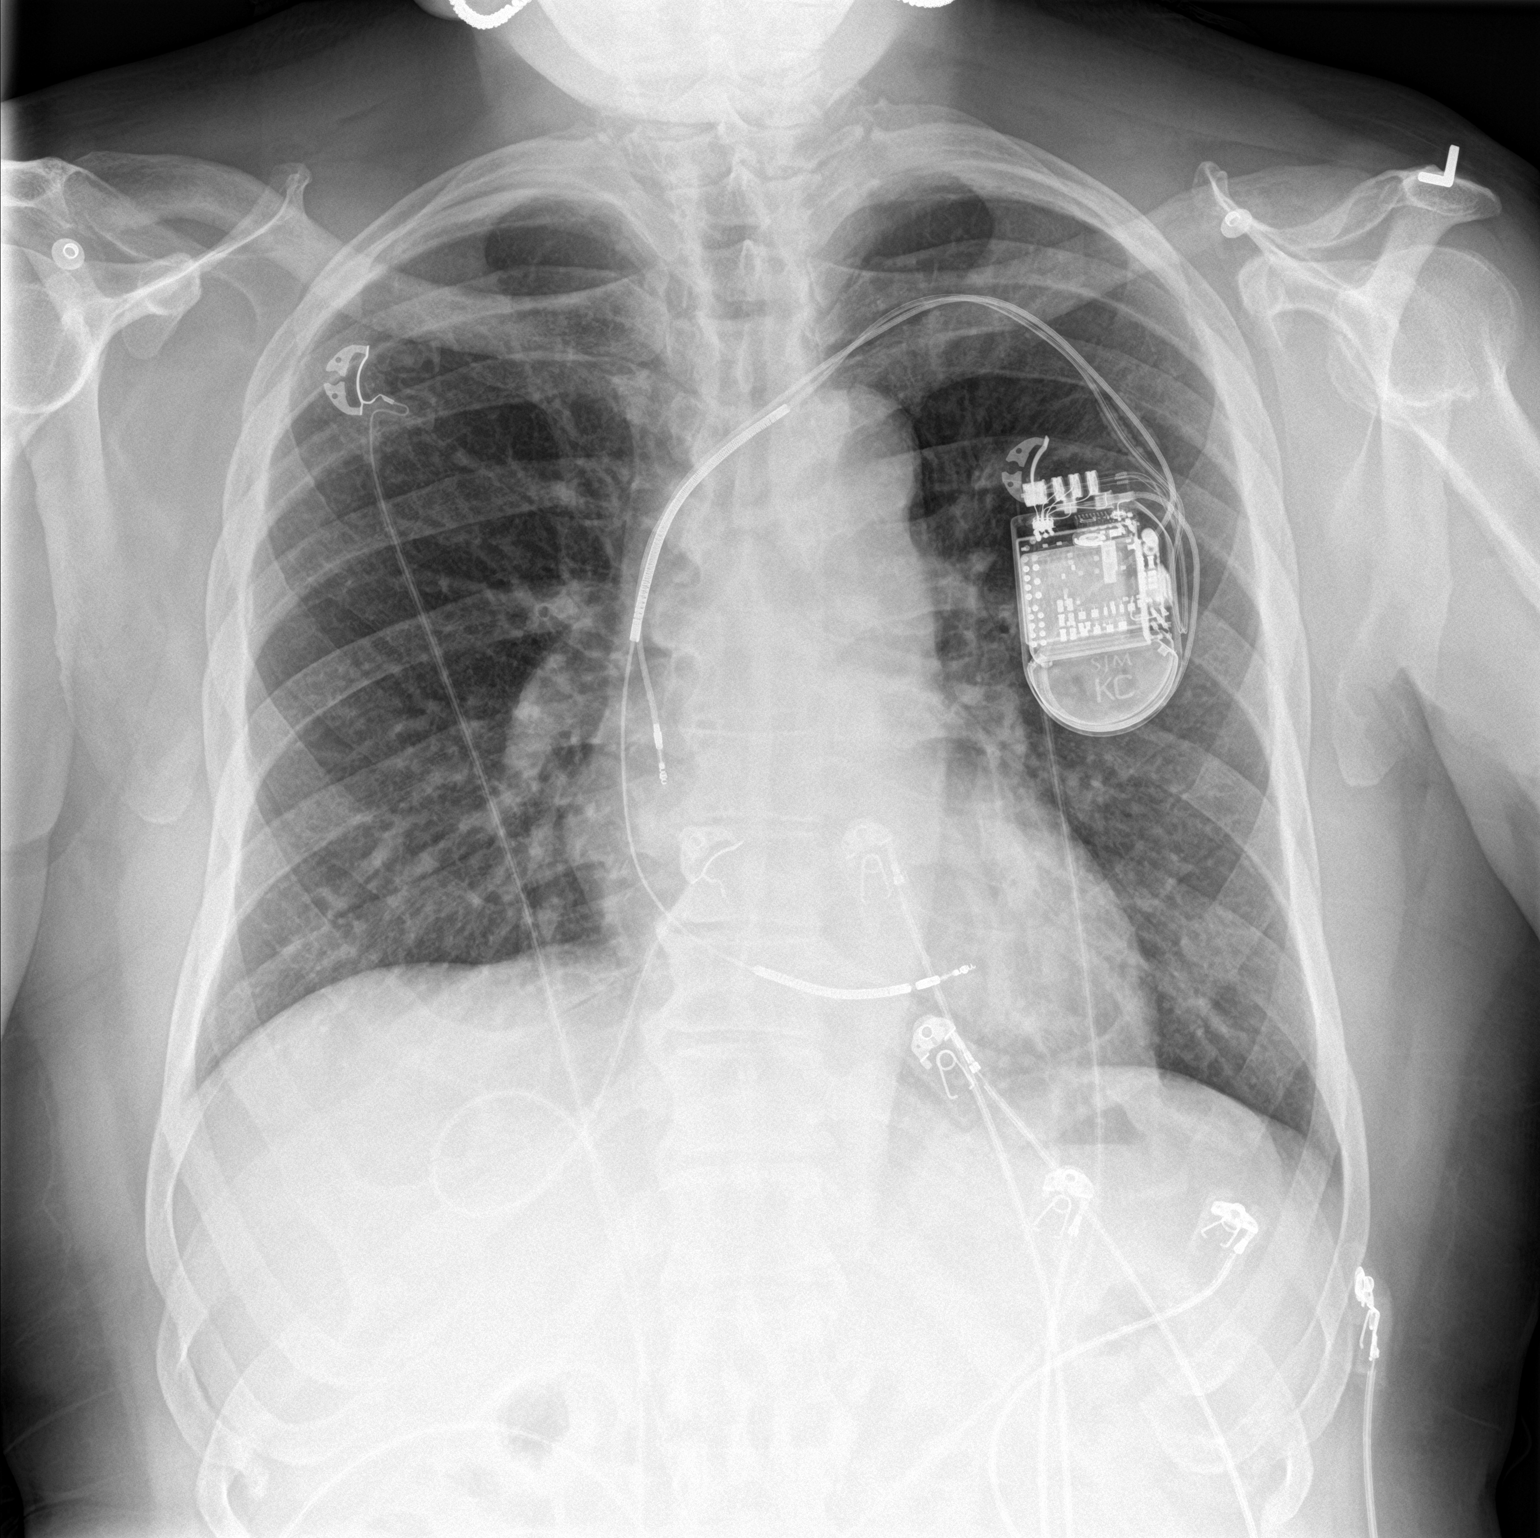

[chest lat]
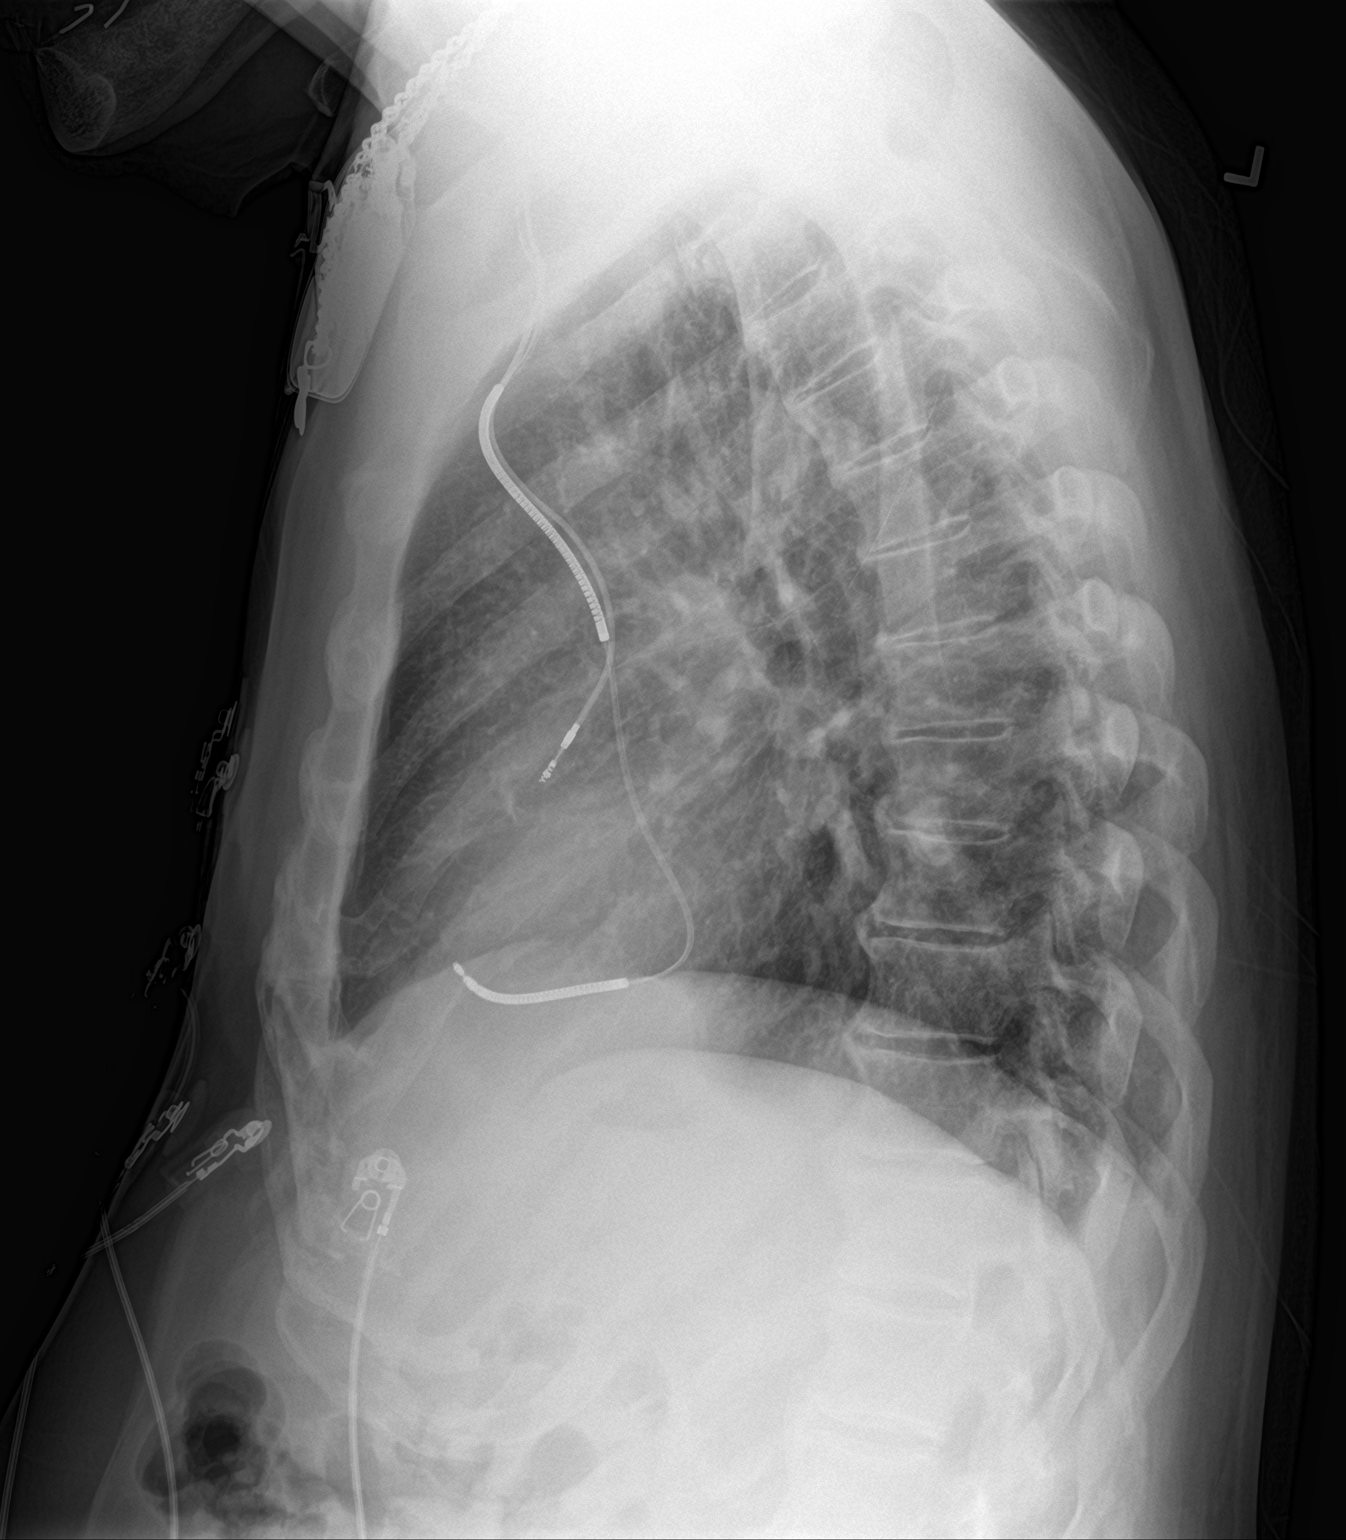

[2 of 2 positions shown; findings below may reference images not displayed]

FINDINGS: The lungs are clear. There is no pleural effusion or pneumothorax.
The cardiac silhouette is within normal limits. Left pectoral AICD
device. No acute osseous pathology.
IMPRESSION: No active cardiopulmonary disease.

## 2020-06-27 ENCOUNTER — Ambulatory Visit
Admission: RE | Admit: 2020-06-27 | Discharge: 2020-06-27 | Disposition: A | Discharge status: Home / Self Care | Payer: Medicare Other | Source: Ambulatory Visit | Attending: Internal Medicine | Admitting: Internal Medicine

## 2020-06-27 ENCOUNTER — Other Ambulatory Visit: Payer: Self-pay | Admitting: Internal Medicine

## 2020-06-27 DIAGNOSIS — M25511 Pain in right shoulder: Secondary | ICD-10-CM

## 2020-06-27 DIAGNOSIS — M109 Gout, unspecified: Secondary | ICD-10-CM | POA: Diagnosis not present

## 2020-06-27 DIAGNOSIS — M19011 Primary osteoarthritis, right shoulder: Secondary | ICD-10-CM | POA: Diagnosis not present

## 2020-06-27 DIAGNOSIS — E7800 Pure hypercholesterolemia, unspecified: Secondary | ICD-10-CM | POA: Diagnosis not present

## 2020-06-27 DIAGNOSIS — I10 Essential (primary) hypertension: Secondary | ICD-10-CM | POA: Diagnosis not present

## 2020-06-27 DIAGNOSIS — M1612 Unilateral primary osteoarthritis, left hip: Secondary | ICD-10-CM | POA: Diagnosis not present

## 2020-06-27 DIAGNOSIS — M25552 Pain in left hip: Secondary | ICD-10-CM | POA: Diagnosis not present

## 2020-06-27 DIAGNOSIS — I4891 Unspecified atrial fibrillation: Secondary | ICD-10-CM | POA: Diagnosis not present

## 2020-06-27 DIAGNOSIS — G8929 Other chronic pain: Secondary | ICD-10-CM | POA: Diagnosis not present

## 2020-06-30 DIAGNOSIS — M19011 Primary osteoarthritis, right shoulder: Secondary | ICD-10-CM | POA: Diagnosis not present

## 2020-06-30 DIAGNOSIS — M25552 Pain in left hip: Secondary | ICD-10-CM | POA: Diagnosis not present

## 2020-07-07 NOTE — Progress Notes (Signed)
Virtual Visit via Video Note changed to phone visit at patient request   This visit type was conducted due to national recommendations for restrictions regarding the COVID-19 Pandemic (e.g. social distancing) in an effort to limit this patient's exposure and mitigate transmission in our community.  Due to his co-morbid illnesses, this patient is at least at moderate risk for complications without adequate follow up.  This format is felt to be most appropriate for this patient at this time.  All issues noted in this document were discussed and addressed.  A limited physical exam was performed with this format.  Please refer to the patient's chart for his consent to telehealth for [facility].      Date:  07/10/2020   ID:  Anthony Foster, DOB [birth year], MRN [id number]  Patient Location:Home Provider Location: Home  PCP:  Wenda Low, MD  Cardiologist:  Dr Stanford Breed  Evaluation Performed:  Follow-Up Visit  Chief Complaint:  FU CAD and CHF  History of Present Illness:    FU CAD and congestive heart failure. Previously cared for in [facility]. Patient was admitted in January 2017 with congestive heart failure. It was noted he had a history of atrial fibrillation, coronary artery disease, ischemic cardiomyopathy with ejection fraction 35-40%. Echocardiogram April 2017 showed ejection fraction 38-75%, grade 2 diastolic dysfunction, moderate left atrial enlargement and mild tricuspid regurgitation.Nuclear study July 2019 showed ejection fraction 40%, inferior scar versus attenuation and increased pulmonary uptake. Abdominal ultrasound November 2021 showed 3.9 cm abdominal aortic aneurysm.Since last seen,patient denies dyspnea, chest pain, palpitations or syncope.  No bleeding.  The patient does not have symptoms concerning for COVID-19 infection (fever, chills, cough, or new shortness of breath).    Past Medical History:  Diagnosis Date  . AAA (abdominal aortic  aneurysm) (HCC)    3.5 cm 04/2018  . Atrial fibrillation ([place])   . CHF (congestive heart failure) ([place])   . Coronary artery disease    Prior stent  . Degenerative joint disease   . Full dentures   . Gout   . Hypercholesteremia   . Hypertension   . 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 Ischemic cardiomyopathy   . Pneumonia   . Renal insufficiency   . Wears glasses    Past Surgical History:  Procedure Laterality Date  . AV FISTULA PLACEMENT Right 11/26/2018   Procedure:     Right arm Brachiocephalic Fistula Creation   ;  Surgeon: Waynetta Sandy, MD;  Location: [place];  Service: Vascular;  Laterality: Right;  . BACK SURGERY    . CARDIAC SURGERY    . CATARACT EXTRACTION W/ INTRAOCULAR LENS  IMPLANT, BILATERAL    . COLONOSCOPY W/ BIOPSIES AND POLYPECTOMY    . MASS EXCISION     neck  . MULTIPLE TOOTH EXTRACTIONS    . PACEMAKER PLACEMENT    . SHOULDER SURGERY     left  . TOTAL SHOULDER REPLACEMENT    . WRIST SURGERY       Current Meds  Medication Sig  . allopurinol (ZYLOPRIM) 300 MG tablet Take 300 mg by mouth daily.  Marland Kitchen apixaban (ELIQUIS) 2.5 MG TABS tablet Take 1 tablet (2.5 mg total) by mouth daily.  Marland Kitchen atorvastatin (LIPITOR) 80 MG tablet Take 1 tablet (80 mg total) by mouth daily.  . carvedilol (COREG) 25 MG tablet Take 25 mg by mouth daily.   . furosemide (LASIX) 40 MG tablet Take 40 mg by mouth daily.  . hydrALAZINE (APRESOLINE)  25 MG tablet Take 1 tablet (25 mg total) by mouth 3 (three) times daily. (Patient taking differently: Take 25 mg by mouth daily.)  . isosorbide mononitrate (IMDUR) 60 MG 24 hr tablet Take 1 tablet (60 mg total) by mouth daily.  Marland Kitchen oxyCODONE-acetaminophen (PERCOCET/ROXICET) 5-325 MG tablet Take 1 tablet by mouth every 6 (six) hours as needed.  . potassium chloride SA (K-DUR,KLOR-CON) 20 MEQ tablet Take 10 mEq by mouth daily.      Allergies:   Hymenoptera venom preparations, Lisinopril, Piroxicam, Shellfish  allergy, and Amoxicillin-pot clavulanate   Social History   Tobacco Use  . Smoking status: Current Some Day Smoker    Types: Cigars  . Smokeless tobacco: Never Used  Vaping Use  . Vaping Use: Never used  Substance Use Topics  . Alcohol use: Not Currently    Alcohol/week: 0.0 standard drinks  . Drug use: Never     Family Hx: The patient's family history includes Diabetes in his sister; Heart disease in an other family member.  ROS:   Please see the history of present illness.    Arthralgias but no Fever, chills  or productive cough All other systems reviewed and are negative.  Recent Lipid Panel Lab Results  Component Value Date/Time   CHOL 131 01/12/2016 08:51 AM   TRIG 66 01/12/2016 08:51 AM   HDL 61 01/12/2016 08:51 AM   CHOLHDL 2.1 01/12/2016 08:51 AM   LDLCALC 57 01/12/2016 08:51 AM    Wt Readings from Last 3 Encounters:  07/10/20 185 lb (83.9 kg)  06/04/19 190 lb 3.2 oz (86.3 kg)  01/11/19 190 lb (86.2 kg)     Objective:    Vital Signs:  Ht 5\' 8"  (1.727 m)   Wt 185 lb (83.9 kg)   BMI 28.13 kg/m    VITAL SIGNS:  reviewed NAD Answers questions appropriately Normal affect Remainder of physical examination not performed (telehealth visit; coronavirus pandemic)  ASSESSMENT & PLAN:    1. Coronary artery disease-patient denies chest pain.  Continue statin.  No aspirin given need for apixaban. 2. Ischemic cardiomyopathy-continue beta-blocker, hydralazine and nitrates.  He is not on Entresto or ARB given severity of renal insufficiency. 3. Paroxysmal atrial fibrillation-by history he remains in sinus rhythm.  We will continue beta-blocker and apixaban. 4. Abdominal aortic aneurysm-plan follow-up ultrasound November 2022. 5. ICD-Per electrophysiology. 6. Hypertension-blood pressure controlled.  Continue present medications. 7. Hyperlipidemia-continue statin. 8. Tobacco abuse-patient counseled on discontinuing. 9. Renal insufficiency-followed by  nephrology.  COVID-19 Education: The importance of social distancing was discussed today.  Time:   Today, I have spent 14 minutes with the patient with telehealth technology discussing the above problems.     Medication Adjustments/Labs and Tests Ordered: Current medicines are reviewed at length with the patient today.  Concerns regarding medicines are outlined above.   Tests Ordered: No orders of the defined types were placed in this encounter.   Medication Changes: No orders of the defined types were placed in this encounter.   Follow Up:  In Person in 6 month(s)  Signed, Kirk Ruths, MD  07/10/2020 11:01 AM    [place]

## 2020-07-10 ENCOUNTER — Encounter: Payer: Self-pay | Admitting: Cardiology

## 2020-07-10 ENCOUNTER — Telehealth (INDEPENDENT_AMBULATORY_CARE_PROVIDER_SITE_OTHER): Payer: Medicare Other | Admitting: Cardiology

## 2020-07-10 VITALS — Ht 68.0 in | Wt 185.0 lb

## 2020-07-10 DIAGNOSIS — I2510 Atherosclerotic heart disease of native coronary artery without angina pectoris: Secondary | ICD-10-CM

## 2020-07-10 DIAGNOSIS — I714 Abdominal aortic aneurysm, without rupture, unspecified: Secondary | ICD-10-CM

## 2020-07-10 DIAGNOSIS — I5022 Chronic systolic (congestive) heart failure: Secondary | ICD-10-CM

## 2020-07-10 DIAGNOSIS — I255 Ischemic cardiomyopathy: Secondary | ICD-10-CM

## 2020-07-10 DIAGNOSIS — I10 Essential (primary) hypertension: Secondary | ICD-10-CM

## 2020-07-10 NOTE — Patient Instructions (Signed)

## 2020-07-20 ENCOUNTER — Ambulatory Visit (INDEPENDENT_AMBULATORY_CARE_PROVIDER_SITE_OTHER): Payer: Medicare Other

## 2020-07-20 DIAGNOSIS — I255 Ischemic cardiomyopathy: Secondary | ICD-10-CM | POA: Diagnosis not present

## 2020-07-20 LAB — CUP PACEART REMOTE DEVICE CHECK
Battery Remaining Longevity: 20 mo
Battery Remaining Percentage: 25 %
Battery Voltage: 2.81 V
Brady Statistic AP VP Percent: 1 %
Brady Statistic AP VS Percent: 1.1 %
Brady Statistic AS VP Percent: 43 %
Brady Statistic AS VS Percent: 53 %
Brady Statistic RA Percent Paced: 1 %
Brady Statistic RV Percent Paced: 44 %
Date Time Interrogation Session: 20220127040018
HighPow Impedance: 52 Ohm
HighPow Impedance: 53 Ohm
Implantable Lead Implant Date: 20140206
Implantable Lead Implant Date: 20140206
Implantable Lead Location: 753859
Implantable Lead Location: 753860
Implantable Pulse Generator Implant Date: 20170206
Lead Channel Impedance Value: 380 Ohm
Lead Channel Impedance Value: 430 Ohm
Lead Channel Pacing Threshold Amplitude: 0.75 V
Lead Channel Pacing Threshold Amplitude: 3.25 V
Lead Channel Pacing Threshold Pulse Width: 0.5 ms
Lead Channel Pacing Threshold Pulse Width: 1 ms
Lead Channel Sensing Intrinsic Amplitude: 1.4 mV
Lead Channel Sensing Intrinsic Amplitude: 11.5 mV
Lead Channel Setting Pacing Amplitude: 1 V
Lead Channel Setting Pacing Amplitude: 5 V
Lead Channel Setting Pacing Pulse Width: 0.5 ms
Lead Channel Setting Sensing Sensitivity: 0.5 mV
Pulse Gen Serial Number: 7072725

## 2020-07-28 DIAGNOSIS — M19011 Primary osteoarthritis, right shoulder: Secondary | ICD-10-CM | POA: Diagnosis not present

## 2020-07-28 DIAGNOSIS — M25552 Pain in left hip: Secondary | ICD-10-CM | POA: Diagnosis not present

## 2020-07-31 NOTE — Progress Notes (Signed)
Remote ICD transmission.   

## 2020-08-01 DIAGNOSIS — N184 Chronic kidney disease, stage 4 (severe): Secondary | ICD-10-CM | POA: Diagnosis not present

## 2020-08-01 DIAGNOSIS — Z720 Tobacco use: Secondary | ICD-10-CM | POA: Diagnosis not present

## 2020-08-01 DIAGNOSIS — N189 Chronic kidney disease, unspecified: Secondary | ICD-10-CM | POA: Diagnosis not present

## 2020-08-01 DIAGNOSIS — N2581 Secondary hyperparathyroidism of renal origin: Secondary | ICD-10-CM | POA: Diagnosis not present

## 2020-08-01 DIAGNOSIS — D631 Anemia in chronic kidney disease: Secondary | ICD-10-CM | POA: Diagnosis not present

## 2020-08-01 DIAGNOSIS — Z6827 Body mass index (BMI) 27.0-27.9, adult: Secondary | ICD-10-CM | POA: Diagnosis not present

## 2020-08-01 DIAGNOSIS — I129 Hypertensive chronic kidney disease with stage 1 through stage 4 chronic kidney disease, or unspecified chronic kidney disease: Secondary | ICD-10-CM | POA: Diagnosis not present

## 2020-08-08 ENCOUNTER — Telehealth: Payer: Self-pay | Admitting: Emergency Medicine

## 2020-08-08 NOTE — Telephone Encounter (Signed)
Looks like SVT to me. Lets look at this together.

## 2020-08-08 NOTE — Telephone Encounter (Signed)
Alert received for episode of VT  At 181 bpm on 08/07/20 at 0755  that was successfully treated by ATP x 1. Patient reports he recalls feeling dizzy. No CP, chest tightness, SOB , or syncope. Medications include Coreg 25 mg BID, Eliquis 5 mg BID. Reviewed shock plan and [place] DMV driving restrictions with patient. Will forward to Dr Lovena Le for review.

## 2020-08-11 NOTE — Telephone Encounter (Signed)
Per Dr. Lovena Le, the patient does not have driving restrictions and he would like to see patient within the next 2-3 months for an office visit. Patient verbalized understanding. Thanked me for call.  Will send to scheduler. Patient made aware.

## 2020-08-12 IMAGING — DX DG HIP (WITH OR WITHOUT PELVIS) 2-3V*R*
3 series · 3 of 3 positions shown · non-contrast
Comparison: None.

CLINICAL DATA: Fell.  Right hip pain.

EXAM:
DG HIP (WITH OR WITHOUT PELVIS) 2-3V RIGHT

[pelvis ap]
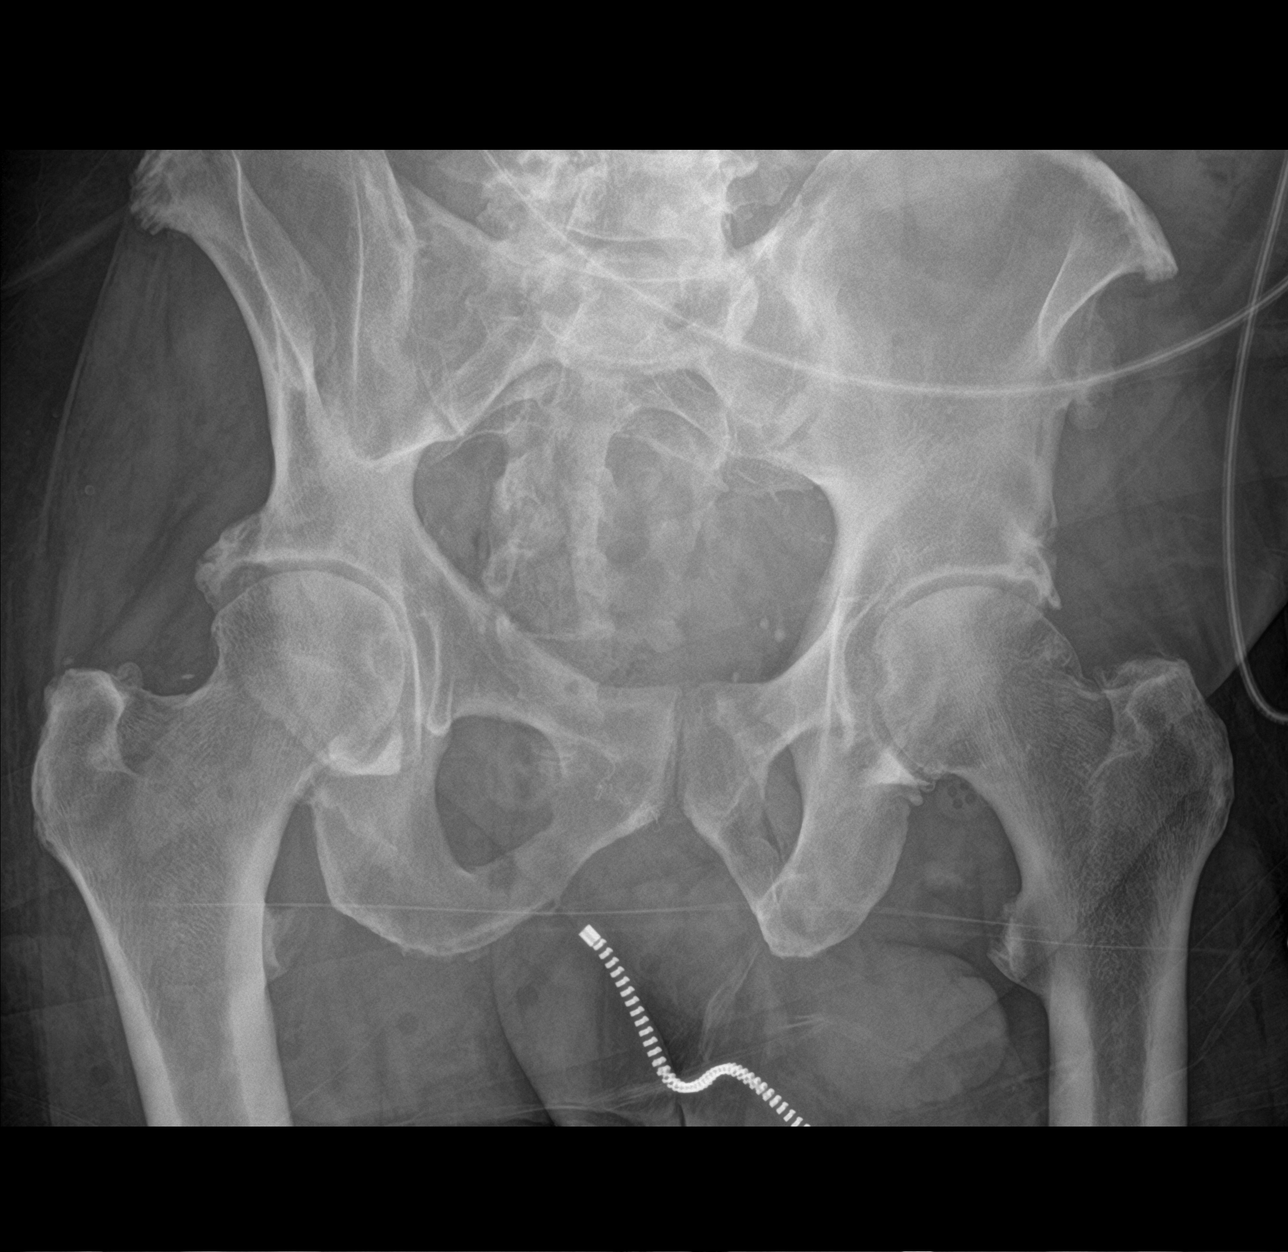

[hip ap]
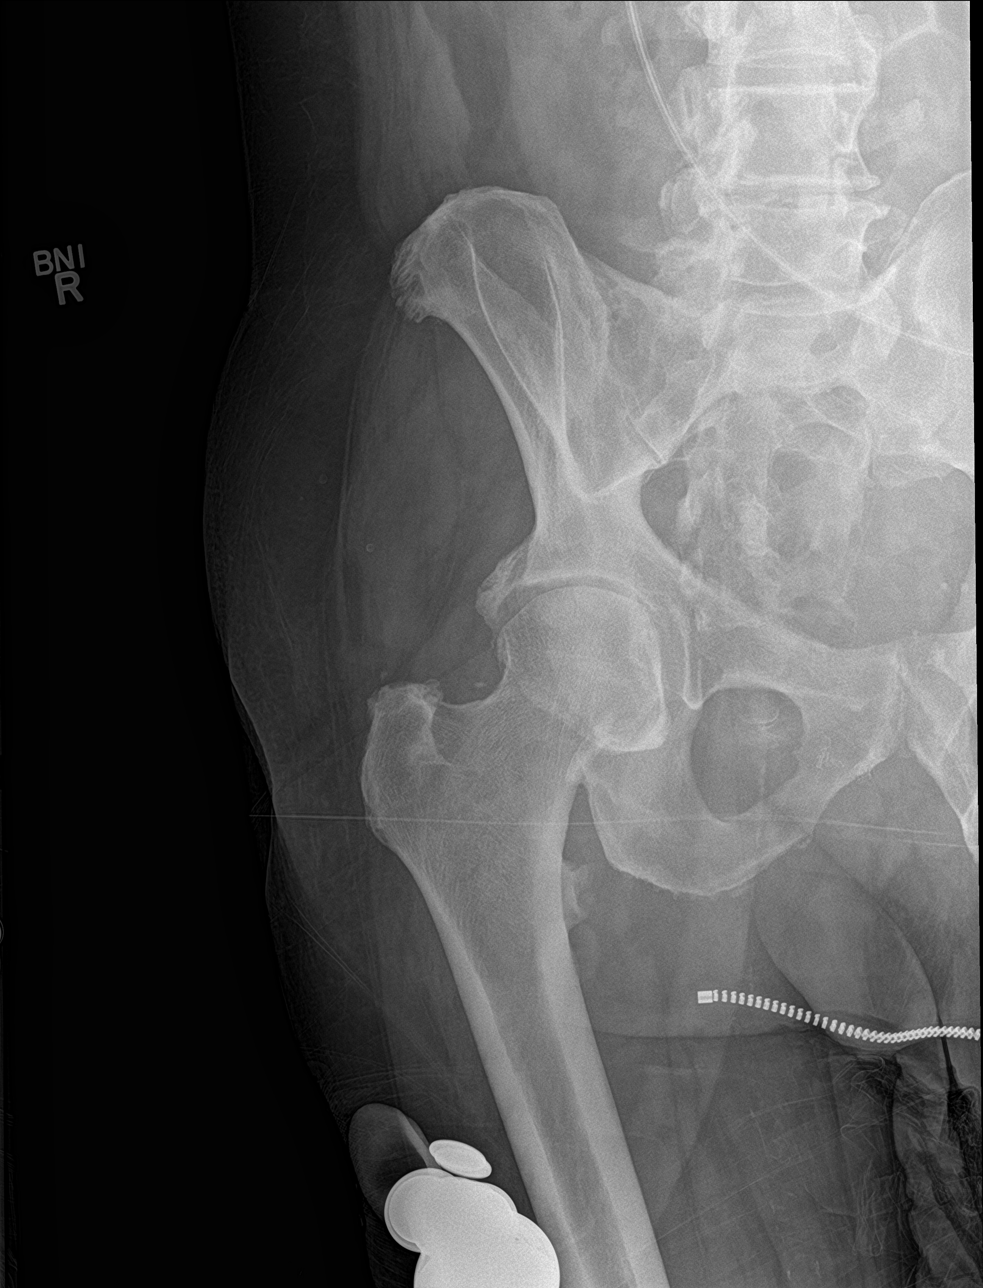

[hip lat]
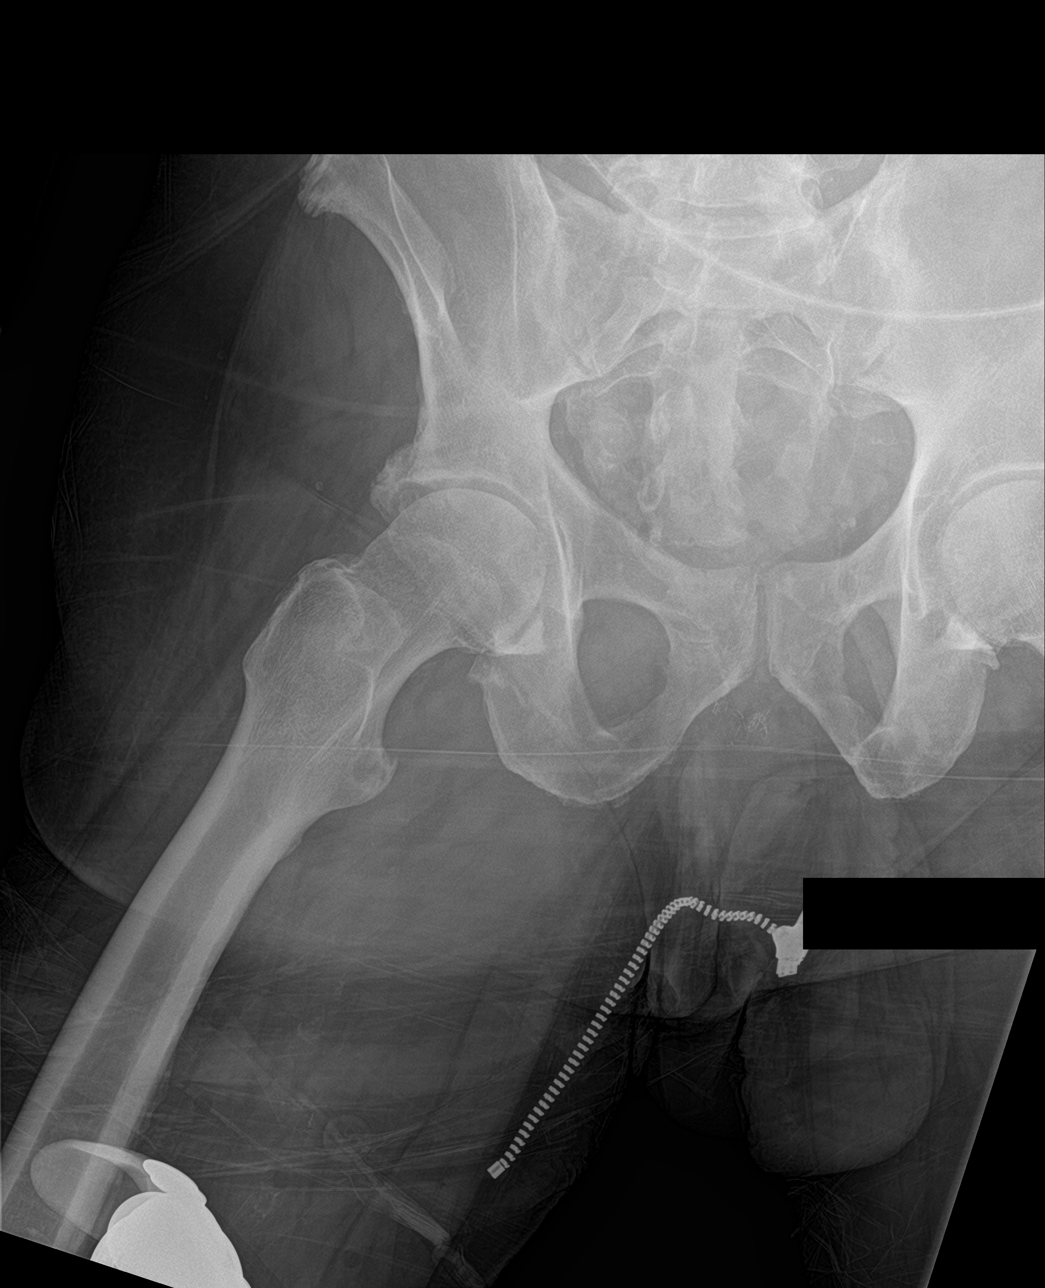

[3 of 3 positions shown; findings below may reference images not displayed]

FINDINGS: Both hips are normally located. Moderate to advanced degenerative
changes bilaterally. No fracture or plain film evidence of avascular
necrosis. The pubic symphysis and SI joints are grossly intact. No
pelvic fractures or bone lesions.
IMPRESSION: Moderate to advanced bilateral hip joint degenerative changes but no
fracture or AVN.

Intact bony pelvis.

## 2020-09-28 DIAGNOSIS — I10 Essential (primary) hypertension: Secondary | ICD-10-CM | POA: Diagnosis not present

## 2020-09-28 DIAGNOSIS — I509 Heart failure, unspecified: Secondary | ICD-10-CM | POA: Diagnosis not present

## 2020-09-28 DIAGNOSIS — E7800 Pure hypercholesterolemia, unspecified: Secondary | ICD-10-CM | POA: Diagnosis not present

## 2020-09-28 DIAGNOSIS — M109 Gout, unspecified: Secondary | ICD-10-CM | POA: Diagnosis not present

## 2020-09-28 DIAGNOSIS — N184 Chronic kidney disease, stage 4 (severe): Secondary | ICD-10-CM | POA: Diagnosis not present

## 2020-09-28 DIAGNOSIS — Z95810 Presence of automatic (implantable) cardiac defibrillator: Secondary | ICD-10-CM | POA: Diagnosis not present

## 2020-09-28 DIAGNOSIS — I4891 Unspecified atrial fibrillation: Secondary | ICD-10-CM | POA: Diagnosis not present

## 2020-09-28 DIAGNOSIS — J449 Chronic obstructive pulmonary disease, unspecified: Secondary | ICD-10-CM | POA: Diagnosis not present

## 2020-09-28 DIAGNOSIS — R7303 Prediabetes: Secondary | ICD-10-CM | POA: Diagnosis not present

## 2020-09-28 DIAGNOSIS — F17210 Nicotine dependence, cigarettes, uncomplicated: Secondary | ICD-10-CM | POA: Diagnosis not present

## 2020-10-05 ENCOUNTER — Encounter: Payer: Self-pay | Admitting: Internal Medicine

## 2020-10-05 ENCOUNTER — Ambulatory Visit (INDEPENDENT_AMBULATORY_CARE_PROVIDER_SITE_OTHER): Payer: Medicare Other | Admitting: Internal Medicine

## 2020-10-05 ENCOUNTER — Other Ambulatory Visit: Payer: Self-pay

## 2020-10-05 VITALS — BP 132/68 | HR 87 | Ht 68.0 in | Wt 181.0 lb

## 2020-10-05 DIAGNOSIS — I255 Ischemic cardiomyopathy: Secondary | ICD-10-CM

## 2020-10-05 DIAGNOSIS — Z95810 Presence of automatic (implantable) cardiac defibrillator: Secondary | ICD-10-CM

## 2020-10-05 DIAGNOSIS — I480 Paroxysmal atrial fibrillation: Secondary | ICD-10-CM | POA: Diagnosis not present

## 2020-10-05 DIAGNOSIS — I5022 Chronic systolic (congestive) heart failure: Secondary | ICD-10-CM

## 2020-10-05 NOTE — Progress Notes (Signed)
HPI 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 He is a pleasant 80yo man with an ICM, S/P ICD implant who has moved from the [place] part [place] our state to [place]. He has class 2 CHF symptoms. He has a h/o CAD and PAF. He is stable and denies peripheral edema. No ICD shock. 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  Allergies  Allergen Reactions  . Hymenoptera Venom Preparations Hives    Reaction to wasps  . Lisinopril Other (See Comments)    angioedema  . Piroxicam Other (See Comments)    burn  . Shellfish Allergy Swelling  . Amoxicillin-Pot Clavulanate Rash    Did it involve swelling of the face/tongue/throat, SOB, or low BP? No Did it involve sudden or severe rash/hives, skin peeling, or any reaction on the inside of your mouth or nose? Yes Did you need to seek medical attention at a hospital or doctor's office? Yes When did it last happen?over 10 years If all above answers are "NO", may proceed with cephalosporin use.      Current Outpatient Medications  Medication Sig Dispense Refill  . allopurinol (ZYLOPRIM) 300 MG tablet Take 300 mg by mouth daily.  6  . apixaban (ELIQUIS) 2.5 MG TABS tablet Take 1 tablet (2.5 mg total) by mouth daily. 180 tablet 3  . atorvastatin (LIPITOR) 80 MG tablet Take 1 tablet (80 mg total) by mouth daily. 30 tablet 6  . carvedilol (COREG) 25 MG tablet Take 25 mg by mouth daily.   1  . furosemide (LASIX) 40 MG tablet Take 40 mg by mouth daily.  3  . hydrALAZINE (APRESOLINE) 25 MG tablet Take 1 tablet (25 mg total) by mouth 3 (three) times daily. 270 tablet 3  . isosorbide mononitrate (IMDUR) 60 MG 24 hr tablet Take 1 tablet (60 mg total) by mouth daily. 30 tablet 4  . oxyCODONE-acetaminophen (PERCOCET/ROXICET) 5-325 MG tablet Take 1 tablet by mouth every 6 (six) hours as needed. 10 tablet 0  . potassium chloride SA  (K-DUR,KLOR-CON) 20 MEQ tablet Take 10 mEq by mouth daily.   6  . nitroGLYCERIN (NITROSTAT) 0.4 MG SL tablet Place 1 tablet (0.4 mg total) under the tongue every 5 (five) minutes as needed for chest pain. 25 tablet 3   No current facility-administered medications for this visit.     Past Medical History:  Diagnosis Date  . AAA (abdominal aortic aneurysm) (HCC)    3.5 cm 04/2018  . Atrial fibrillation ([place])   . CHF (congestive heart failure) ([facility])   . Coronary artery disease    Prior stent  . Degenerative joint disease   . Full dentures   . Gout   . Hypercholesteremia   . Hypertension   . 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 Ischemic cardiomyopathy   . Pneumonia   . Renal insufficiency   . Wears glasses     ROS:   All systems reviewed and negative except as noted in the HPI.   Past Surgical History:  Procedure Laterality Date  . AV FISTULA PLACEMENT Right 11/26/2018   Procedure:     Right arm Brachiocephalic Fistula Creation   ;  Surgeon: Waynetta Sandy, MD;  Location: [facility];  Service: Vascular;  Laterality: Right;  . BACK SURGERY    . CARDIAC SURGERY    . CATARACT EXTRACTION W/ INTRAOCULAR LENS  IMPLANT, BILATERAL    .  COLONOSCOPY W/ BIOPSIES AND POLYPECTOMY    . MASS EXCISION     neck  . MULTIPLE TOOTH EXTRACTIONS    . PACEMAKER PLACEMENT    . SHOULDER SURGERY     left  . TOTAL SHOULDER REPLACEMENT    . WRIST SURGERY       Family History  Problem Relation Age of Onset  . Heart disease Other        No family history  . Diabetes Sister      Social History   Socioeconomic History  . Marital status: Divorced    Spouse name: Not on file  . Number of children: 4  . Years of education: Not on file  . Highest education level: Not on file  Occupational History  . Not on file  Tobacco Use  . Smoking status: Current Some Day Smoker    Types: Cigars  . Smokeless tobacco: Never Used  Vaping Use  . Vaping Use: Never  used  Substance and Sexual Activity  . Alcohol use: Not Currently    Alcohol/week: 0.0 standard drinks  . Drug use: Never  . Sexual activity: Not on file  Other Topics Concern  . Not on file  Social History Narrative  . Not on file   Social Determinants of Health   Financial Resource Strain: Not on file  Food Insecurity: Not on file  Transportation Needs: Not on file  Physical Activity: Not on file  Stress: Not on file  Social Connections: Not on file  Intimate Partner Violence: Not on file     BP 132/68   Pulse 87   Ht 5\' 8"  (1.727 m)   Wt 181 lb (82.1 kg)   SpO2 99%   BMI 27.52 kg/m   Physical Exam:  Well appearing NAD HEENT: Unremarkable Neck:  No JVD, no thyromegally Lymphatics:  No adenopathy Back:  No CVA tenderness Lungs:  Clear with no wheezes HEART:  Regular rate rhythm, no murmurs, no rubs, no clicks Abd:  soft, positive bowel sounds, no organomegally, no rebound, no guarding Ext:  2 plus pulses, no edema, no cyanosis, no clubbing Skin:  No rashes no nodules Neuro:  CN II through XII intact, motor grossly intact  EKG - nsr with first degree AV block and PVC's  DEVICE  Normal device function.  See PaceArt for details.   Assess/Plan: 1. PAF - he is maintaining NSR over 99%. He will continue his blood thinner. 2. VT - he has had a single episode ofVT which was terminated with ATP. No change in his meds. He is asymptomatic. 3. ICD - his [employer] DDD ICD is working normally except for a chronically elevated RV threshold. He will require a new lead when he reaches ERI. He is about 20 months from this 4. Chronic systolic heart failure - he is class 2. He remains active. He will continue his current CHF meds.  Anthony Overlie Makalia Bare,MD

## 2020-10-05 NOTE — Patient Instructions (Signed)
Medication Instructions:  Your physician recommends that you continue on your current medications as directed. Please refer to the Current Medication list given to you today.  Labwork: None ordered.  Testing/Procedures: None ordered.  Follow-Up: Your physician wants you to follow-up in: one year with Gregg Taylor, MD or one of the following Advanced Practice Providers on your designated Care Team:    Amber Seiler, NP  Renee Ursuy, PA-C  Michael "Andy" Tillery, PA-C  Remote monitoring is used to monitor your ICD from home. This monitoring reduces the number of office visits required to check your device to one time per year. It allows us to keep an eye on the functioning of your device to ensure it is working properly. You are scheduled for a device check from home on 10/19/2020. You may send your transmission at any time that day. If you have a wireless device, the transmission will be sent automatically. After your physician reviews your transmission, you will receive a postcard with your next transmission date.  Any Other Special Instructions Will Be Listed Below (If Applicable).  If you need a refill on your cardiac medications before your next appointment, please call your pharmacy.       

## 2020-10-19 ENCOUNTER — Ambulatory Visit (INDEPENDENT_AMBULATORY_CARE_PROVIDER_SITE_OTHER): Payer: Medicare Other

## 2020-10-19 DIAGNOSIS — I5022 Chronic systolic (congestive) heart failure: Secondary | ICD-10-CM

## 2020-10-19 DIAGNOSIS — I255 Ischemic cardiomyopathy: Secondary | ICD-10-CM

## 2020-10-19 LAB — CUP PACEART REMOTE DEVICE CHECK
Battery Remaining Longevity: 18 mo
Battery Remaining Percentage: 22 %
Battery Voltage: 2.78 V
Brady Statistic AP VP Percent: 1.1 %
Brady Statistic AP VS Percent: 2.7 %
Brady Statistic AS VP Percent: 14 %
Brady Statistic AS VS Percent: 79 %
Brady Statistic RA Percent Paced: 1.9 %
Brady Statistic RV Percent Paced: 15 %
Date Time Interrogation Session: 20220428040015
HighPow Impedance: 46 Ohm
HighPow Impedance: 47 Ohm
Implantable Lead Implant Date: 20140206
Implantable Lead Implant Date: 20140206
Implantable Lead Location: 753859
Implantable Lead Location: 753860
Implantable Pulse Generator Implant Date: 20170206
Lead Channel Impedance Value: 360 Ohm
Lead Channel Impedance Value: 390 Ohm
Lead Channel Pacing Threshold Amplitude: 0.875 V
Lead Channel Pacing Threshold Amplitude: 3 V
Lead Channel Pacing Threshold Pulse Width: 0.5 ms
Lead Channel Pacing Threshold Pulse Width: 1 ms
Lead Channel Sensing Intrinsic Amplitude: 1.2 mV
Lead Channel Sensing Intrinsic Amplitude: 6.7 mV
Lead Channel Setting Pacing Amplitude: 1.125
Lead Channel Setting Pacing Amplitude: 5 V
Lead Channel Setting Pacing Pulse Width: 0.5 ms
Lead Channel Setting Sensing Sensitivity: 0.5 mV
Pulse Gen Serial Number: 7072725

## 2020-10-27 DIAGNOSIS — N184 Chronic kidney disease, stage 4 (severe): Secondary | ICD-10-CM | POA: Diagnosis not present

## 2020-10-27 DIAGNOSIS — N189 Chronic kidney disease, unspecified: Secondary | ICD-10-CM | POA: Diagnosis not present

## 2020-10-27 DIAGNOSIS — I129 Hypertensive chronic kidney disease with stage 1 through stage 4 chronic kidney disease, or unspecified chronic kidney disease: Secondary | ICD-10-CM | POA: Diagnosis not present

## 2020-10-27 DIAGNOSIS — D631 Anemia in chronic kidney disease: Secondary | ICD-10-CM | POA: Diagnosis not present

## 2020-10-27 DIAGNOSIS — Z720 Tobacco use: Secondary | ICD-10-CM | POA: Diagnosis not present

## 2020-10-27 DIAGNOSIS — N2581 Secondary hyperparathyroidism of renal origin: Secondary | ICD-10-CM | POA: Diagnosis not present

## 2020-10-27 DIAGNOSIS — Z6827 Body mass index (BMI) 27.0-27.9, adult: Secondary | ICD-10-CM | POA: Diagnosis not present

## 2020-11-08 NOTE — Progress Notes (Signed)
Remote ICD transmission.   

## 2021-01-18 ENCOUNTER — Ambulatory Visit (INDEPENDENT_AMBULATORY_CARE_PROVIDER_SITE_OTHER): Payer: Medicare Other

## 2021-01-18 DIAGNOSIS — I5022 Chronic systolic (congestive) heart failure: Secondary | ICD-10-CM | POA: Diagnosis not present

## 2021-01-20 LAB — CUP PACEART REMOTE DEVICE CHECK
Battery Remaining Longevity: 14 mo
Battery Remaining Percentage: 18 %
Battery Voltage: 2.75 V
Brady Statistic AP VP Percent: 1.8 %
Brady Statistic AP VS Percent: 3.3 %
Brady Statistic AS VP Percent: 37 %
Brady Statistic AS VS Percent: 54 %
Brady Statistic RA Percent Paced: 2.7 %
Brady Statistic RV Percent Paced: 39 %
Date Time Interrogation Session: 20220728045654
HighPow Impedance: 45 Ohm
HighPow Impedance: 45 Ohm
Implantable Lead Implant Date: 20140206
Implantable Lead Implant Date: 20140206
Implantable Lead Location: 753859
Implantable Lead Location: 753860
Implantable Pulse Generator Implant Date: 20170206
Lead Channel Impedance Value: 330 Ohm
Lead Channel Impedance Value: 380 Ohm
Lead Channel Pacing Threshold Amplitude: 0.875 V
Lead Channel Pacing Threshold Amplitude: 3 V
Lead Channel Pacing Threshold Pulse Width: 0.5 ms
Lead Channel Pacing Threshold Pulse Width: 1 ms
Lead Channel Sensing Intrinsic Amplitude: 1 mV
Lead Channel Sensing Intrinsic Amplitude: 8.8 mV
Lead Channel Setting Pacing Amplitude: 1.125
Lead Channel Setting Pacing Amplitude: 5 V
Lead Channel Setting Pacing Pulse Width: 0.5 ms
Lead Channel Setting Sensing Sensitivity: 0.5 mV
Pulse Gen Serial Number: 7072725

## 2021-01-30 ENCOUNTER — Ambulatory Visit: Payer: Medicare Other | Admitting: Cardiology

## 2021-02-05 DIAGNOSIS — Z720 Tobacco use: Secondary | ICD-10-CM | POA: Diagnosis not present

## 2021-02-05 DIAGNOSIS — Z6827 Body mass index (BMI) 27.0-27.9, adult: Secondary | ICD-10-CM | POA: Diagnosis not present

## 2021-02-05 DIAGNOSIS — I770 Arteriovenous fistula, acquired: Secondary | ICD-10-CM | POA: Diagnosis not present

## 2021-02-05 DIAGNOSIS — D631 Anemia in chronic kidney disease: Secondary | ICD-10-CM | POA: Diagnosis not present

## 2021-02-05 DIAGNOSIS — N189 Chronic kidney disease, unspecified: Secondary | ICD-10-CM | POA: Diagnosis not present

## 2021-02-05 DIAGNOSIS — N2581 Secondary hyperparathyroidism of renal origin: Secondary | ICD-10-CM | POA: Diagnosis not present

## 2021-02-05 DIAGNOSIS — N184 Chronic kidney disease, stage 4 (severe): Secondary | ICD-10-CM | POA: Diagnosis not present

## 2021-02-05 DIAGNOSIS — I129 Hypertensive chronic kidney disease with stage 1 through stage 4 chronic kidney disease, or unspecified chronic kidney disease: Secondary | ICD-10-CM | POA: Diagnosis not present

## 2021-02-13 NOTE — Progress Notes (Signed)
Remote ICD transmission.   

## 2021-04-05 DIAGNOSIS — E7800 Pure hypercholesterolemia, unspecified: Secondary | ICD-10-CM | POA: Diagnosis not present

## 2021-04-05 DIAGNOSIS — Z1211 Encounter for screening for malignant neoplasm of colon: Secondary | ICD-10-CM | POA: Diagnosis not present

## 2021-04-05 DIAGNOSIS — N184 Chronic kidney disease, stage 4 (severe): Secondary | ICD-10-CM | POA: Diagnosis not present

## 2021-04-05 DIAGNOSIS — I4891 Unspecified atrial fibrillation: Secondary | ICD-10-CM | POA: Diagnosis not present

## 2021-04-05 DIAGNOSIS — J449 Chronic obstructive pulmonary disease, unspecified: Secondary | ICD-10-CM | POA: Diagnosis not present

## 2021-04-05 DIAGNOSIS — R7303 Prediabetes: Secondary | ICD-10-CM | POA: Diagnosis not present

## 2021-04-05 DIAGNOSIS — Z95810 Presence of automatic (implantable) cardiac defibrillator: Secondary | ICD-10-CM | POA: Diagnosis not present

## 2021-04-05 DIAGNOSIS — D6869 Other thrombophilia: Secondary | ICD-10-CM | POA: Diagnosis not present

## 2021-04-05 DIAGNOSIS — I509 Heart failure, unspecified: Secondary | ICD-10-CM | POA: Diagnosis not present

## 2021-04-05 DIAGNOSIS — Z1389 Encounter for screening for other disorder: Secondary | ICD-10-CM | POA: Diagnosis not present

## 2021-04-05 DIAGNOSIS — M109 Gout, unspecified: Secondary | ICD-10-CM | POA: Diagnosis not present

## 2021-04-05 DIAGNOSIS — Z0000 Encounter for general adult medical examination without abnormal findings: Secondary | ICD-10-CM | POA: Diagnosis not present

## 2021-04-05 DIAGNOSIS — I10 Essential (primary) hypertension: Secondary | ICD-10-CM | POA: Diagnosis not present

## 2021-04-05 DIAGNOSIS — F17210 Nicotine dependence, cigarettes, uncomplicated: Secondary | ICD-10-CM | POA: Diagnosis not present

## 2021-04-05 DIAGNOSIS — Z23 Encounter for immunization: Secondary | ICD-10-CM | POA: Diagnosis not present

## 2021-04-17 DIAGNOSIS — M25552 Pain in left hip: Secondary | ICD-10-CM | POA: Diagnosis not present

## 2021-04-19 ENCOUNTER — Ambulatory Visit (INDEPENDENT_AMBULATORY_CARE_PROVIDER_SITE_OTHER): Payer: Medicare Other

## 2021-04-19 DIAGNOSIS — I5022 Chronic systolic (congestive) heart failure: Secondary | ICD-10-CM

## 2021-04-19 LAB — CUP PACEART REMOTE DEVICE CHECK
Battery Remaining Longevity: 13 mo
Battery Remaining Percentage: 16 %
Battery Voltage: 2.74 V
Brady Statistic AP VP Percent: 1.5 %
Brady Statistic AP VS Percent: 2.5 %
Brady Statistic AS VP Percent: 43 %
Brady Statistic AS VS Percent: 49 %
Brady Statistic RA Percent Paced: 2.4 %
Brady Statistic RV Percent Paced: 45 %
Date Time Interrogation Session: 20221027040015
HighPow Impedance: 44 Ohm
HighPow Impedance: 44 Ohm
Implantable Lead Implant Date: 20140206
Implantable Lead Implant Date: 20140206
Implantable Lead Location: 753859
Implantable Lead Location: 753860
Implantable Pulse Generator Implant Date: 20170206
Lead Channel Impedance Value: 350 Ohm
Lead Channel Impedance Value: 360 Ohm
Lead Channel Pacing Threshold Amplitude: 1 V
Lead Channel Pacing Threshold Amplitude: 3 V
Lead Channel Pacing Threshold Pulse Width: 0.5 ms
Lead Channel Pacing Threshold Pulse Width: 1 ms
Lead Channel Sensing Intrinsic Amplitude: 1 mV
Lead Channel Sensing Intrinsic Amplitude: 12 mV
Lead Channel Setting Pacing Amplitude: 1.25 V
Lead Channel Setting Pacing Amplitude: 5 V
Lead Channel Setting Pacing Pulse Width: 0.5 ms
Lead Channel Setting Sensing Sensitivity: 0.5 mV
Pulse Gen Serial Number: 7072725

## 2021-04-26 NOTE — Progress Notes (Signed)
Remote ICD transmission.   

## 2021-05-22 ENCOUNTER — Other Ambulatory Visit (HOSPITAL_COMMUNITY): Payer: Self-pay | Admitting: Cardiology

## 2021-05-22 DIAGNOSIS — I714 Abdominal aortic aneurysm, without rupture, unspecified: Secondary | ICD-10-CM

## 2021-05-23 ENCOUNTER — Ambulatory Visit (HOSPITAL_COMMUNITY)
Admission: RE | Admit: 2021-05-23 | Payer: Medicare Other | Source: Ambulatory Visit | Attending: Cardiology | Admitting: Cardiology

## 2021-05-31 NOTE — Progress Notes (Signed)
HPI:FU CAD and congestive heart failure. Previously cared for in [facility]. Patient was admitted in January 2017 with congestive heart failure. It was noted he had a history of atrial fibrillation, coronary artery disease, ischemic cardiomyopathy with ejection fraction 35-40%. Echocardiogram April 2017 showed ejection fraction 53-97%, grade 2 diastolic dysfunction, moderate left atrial enlargement and mild tricuspid regurgitation. Nuclear study July 2019 showed ejection fraction 40%, inferior scar versus attenuation and increased pulmonary uptake. Abdominal ultrasound December 2022 showed 4 cm abdominal aortic aneurysm. Since last seen, he denies dyspnea, chest pain, palpitations or syncope.  Current Outpatient Medications  Medication Sig Dispense Refill   allopurinol (ZYLOPRIM) 300 MG tablet Take 300 mg by mouth daily.  6   apixaban (ELIQUIS) 2.5 MG TABS tablet Take 1 tablet (2.5 mg total) by mouth daily. 180 tablet 3   atorvastatin (LIPITOR) 80 MG tablet Take 1 tablet (80 mg total) by mouth daily. 30 tablet 6   carvedilol (COREG) 25 MG tablet Take 25 mg by mouth daily.   1   furosemide (LASIX) 40 MG tablet Take 40 mg by mouth daily.  3   hydrALAZINE (APRESOLINE) 25 MG tablet Take 1 tablet (25 mg total) by mouth 3 (three) times daily. 270 tablet 3   isosorbide mononitrate (IMDUR) 60 MG 24 hr tablet Take 1 tablet (60 mg total) by mouth daily. 30 tablet 4   oxyCODONE-acetaminophen (PERCOCET/ROXICET) 5-325 MG tablet Take 1 tablet by mouth every 6 (six) hours as needed. 10 tablet 0   potassium chloride SA (K-DUR,KLOR-CON) 20 MEQ tablet Take 10 mEq by mouth daily.   6   nitroGLYCERIN (NITROSTAT) 0.4 MG SL tablet Place 1 tablet (0.4 mg total) under the tongue every 5 (five) minutes as needed for chest pain. 25 tablet 3   No current facility-administered medications for this visit.     Past Medical History:  Diagnosis Date   AAA (abdominal aortic aneurysm)    3.5 cm 04/2018    Atrial fibrillation (HCC)    CHF (congestive heart failure) (HCC)    Coronary artery disease    Prior stent   Degenerative joint disease    Full dentures    Gout    Hypercholesteremia    Hypertension    ICD (implantable cardioverter-defibrillator) battery depletion    Ischemic cardiomyopathy    Pneumonia    Renal insufficiency    Wears glasses     Past Surgical History:  Procedure Laterality Date   AV FISTULA PLACEMENT Right 11/26/2018   Procedure:     Right arm Brachiocephalic Fistula Creation   ;  Surgeon: Waynetta Sandy, MD;  Location: [facility] OR;  Service: Vascular;  Laterality: Right;   BACK SURGERY     CARDIAC SURGERY     CATARACT EXTRACTION W/ INTRAOCULAR LENS  IMPLANT, BILATERAL     COLONOSCOPY W/ BIOPSIES AND POLYPECTOMY     MASS EXCISION     neck   MULTIPLE TOOTH EXTRACTIONS     PACEMAKER PLACEMENT     SHOULDER SURGERY     left   TOTAL SHOULDER REPLACEMENT     WRIST SURGERY      Social History   Socioeconomic History   Marital status: Divorced    Spouse name: Not on file   Number of children: 4   Years of education: Not on file   Highest education level: Not on file  Occupational History   Not on file  Tobacco Use   Smoking status: Some Days  Types: Cigars   Smokeless tobacco: Never  Vaping Use   Vaping Use: Never used  Substance and Sexual Activity   Alcohol use: Not Currently    Alcohol/week: 0.0 standard drinks   Drug use: Never   Sexual activity: Not on file  Other Topics Concern   Not on file  Social History Narrative   Not on file   Social Determinants of Health   Financial Resource Strain: Not on file  Food Insecurity: Not on file  Transportation Needs: Not on file  Physical Activity: Not on file  Stress: Not on file  Social Connections: Not on file  Intimate Partner Violence: Not on file    Family History  Problem Relation Age of Onset   Heart disease Other        No family history   Diabetes Sister     ROS: no  fevers or chills, productive cough, hemoptysis, dysphasia, odynophagia, melena, hematochezia, dysuria, hematuria, rash, seizure activity, orthopnea, PND, pedal edema, claudication. Remaining systems are negative.  Physical Exam: Well-developed well-nourished in no acute distress.  Skin is warm and dry.  HEENT is normal.  Neck is supple.  Chest is clear to auscultation with normal expansion.  Cardiovascular exam is regular rate and rhythm.  Abdominal exam nontender or distended. No masses palpated. Extremities show no edema. neuro grossly intact  ECG- personally reviewed  A/P  1 coronary artery disease-patient remains asymptomatic.  Continue statin.  Is not on aspirin given need for anticoagulation.  2 ischemic cardiomyopathy-patient is not on Entresto or ARB given history of significant renal insufficiency.  Continue hydralazine/nitrates and beta-blocker.  3 paroxysmal atrial fibrillation-he is in sinus rhythm.  Continue beta-blocker and apixaban.  4 abdominal aortic aneurysm-we will arrange follow-up ultrasound 12/23.  5 hypertension-patient's blood pressure is controlled.  Continue present medications.  6 hyperlipidemia-continue statin.  I will have most recent lipids and liver forwarded to [place] from primary care.  7 ICD-followed by electrophysiology.  8 tobacco abuse-patient counseled on discontinuing.  9 renal insufficiency-Per nephrology.  Kirk Ruths, MD

## 2021-06-04 ENCOUNTER — Ambulatory Visit (HOSPITAL_COMMUNITY)
Admission: RE | Admit: 2021-06-04 | Discharge: 2021-06-04 | Disposition: A | Discharge status: Home / Self Care | Payer: Medicare Other | Source: Ambulatory Visit | Attending: Cardiology | Admitting: Cardiology

## 2021-06-04 ENCOUNTER — Other Ambulatory Visit: Payer: Self-pay

## 2021-06-04 DIAGNOSIS — I714 Abdominal aortic aneurysm, without rupture, unspecified: Secondary | ICD-10-CM

## 2021-06-04 DIAGNOSIS — I7143 Infrarenal abdominal aortic aneurysm, without rupture: Secondary | ICD-10-CM

## 2021-06-07 ENCOUNTER — Encounter: Payer: Self-pay | Admitting: Cardiology

## 2021-06-07 ENCOUNTER — Ambulatory Visit (INDEPENDENT_AMBULATORY_CARE_PROVIDER_SITE_OTHER): Payer: Medicare Other | Admitting: Cardiology

## 2021-06-07 ENCOUNTER — Other Ambulatory Visit: Payer: Self-pay

## 2021-06-07 VITALS — BP 136/60 | HR 71 | Ht 68.0 in | Wt 166.2 lb

## 2021-06-07 DIAGNOSIS — Z95810 Presence of automatic (implantable) cardiac defibrillator: Secondary | ICD-10-CM | POA: Diagnosis not present

## 2021-06-07 DIAGNOSIS — I714 Abdominal aortic aneurysm, without rupture, unspecified: Secondary | ICD-10-CM

## 2021-06-07 DIAGNOSIS — I10 Essential (primary) hypertension: Secondary | ICD-10-CM

## 2021-06-07 DIAGNOSIS — E7800 Pure hypercholesterolemia, unspecified: Secondary | ICD-10-CM | POA: Diagnosis not present

## 2021-06-07 DIAGNOSIS — I2510 Atherosclerotic heart disease of native coronary artery without angina pectoris: Secondary | ICD-10-CM | POA: Diagnosis not present

## 2021-06-07 DIAGNOSIS — I5022 Chronic systolic (congestive) heart failure: Secondary | ICD-10-CM

## 2021-06-07 DIAGNOSIS — I480 Paroxysmal atrial fibrillation: Secondary | ICD-10-CM | POA: Diagnosis not present

## 2021-06-07 DIAGNOSIS — I255 Ischemic cardiomyopathy: Secondary | ICD-10-CM

## 2021-06-07 NOTE — Patient Instructions (Signed)

## 2021-06-08 ENCOUNTER — Telehealth: Payer: Self-pay | Admitting: *Deleted

## 2021-06-08 DIAGNOSIS — E7800 Pure hypercholesterolemia, unspecified: Secondary | ICD-10-CM

## 2021-06-08 MED ORDER — EZETIMIBE 10 MG PO TABS: 10.0000 mg | ORAL_TABLET | Freq: Every day | ORAL | 3 refills | Status: DC

## 2021-06-08 NOTE — Telephone Encounter (Signed)
Labs from pcp reviewed by dr Stanford Breed and shows a LDL of 84. Spoke with pt, he will start zetia 10 mg once daily and will have repeat lab work in 8 weeks. New script sent to the pharmacy and Lab orders mailed to the pt

## 2021-07-10 ENCOUNTER — Other Ambulatory Visit (HOSPITAL_COMMUNITY): Payer: Self-pay | Admitting: Cardiology

## 2021-07-10 DIAGNOSIS — I714 Abdominal aortic aneurysm, without rupture, unspecified: Secondary | ICD-10-CM

## 2021-07-19 ENCOUNTER — Ambulatory Visit (INDEPENDENT_AMBULATORY_CARE_PROVIDER_SITE_OTHER): Payer: Medicare Other

## 2021-07-19 DIAGNOSIS — I255 Ischemic cardiomyopathy: Secondary | ICD-10-CM

## 2021-07-19 LAB — CUP PACEART REMOTE DEVICE CHECK
Battery Remaining Longevity: 10 mo
Battery Remaining Percentage: 13 %
Battery Voltage: 2.71 V
Brady Statistic AP VP Percent: 1.3 %
Brady Statistic AP VS Percent: 2.7 %
Brady Statistic AS VP Percent: 31 %
Brady Statistic AS VS Percent: 61 %
Brady Statistic RA Percent Paced: 2.4 %
Brady Statistic RV Percent Paced: 33 %
Date Time Interrogation Session: 20230126040021
HighPow Impedance: 35 Ohm
HighPow Impedance: 35 Ohm
Implantable Lead Implant Date: 20140206
Implantable Lead Implant Date: 20140206
Implantable Lead Location: 753859
Implantable Lead Location: 753860
Implantable Pulse Generator Implant Date: 20170206
Lead Channel Impedance Value: 330 Ohm
Lead Channel Impedance Value: 340 Ohm
Lead Channel Pacing Threshold Amplitude: 1 V
Lead Channel Pacing Threshold Amplitude: 3 V
Lead Channel Pacing Threshold Pulse Width: 0.5 ms
Lead Channel Pacing Threshold Pulse Width: 1 ms
Lead Channel Sensing Intrinsic Amplitude: 0.4 mV
Lead Channel Sensing Intrinsic Amplitude: 12 mV
Lead Channel Setting Pacing Amplitude: 1.25 V
Lead Channel Setting Pacing Amplitude: 5 V
Lead Channel Setting Pacing Pulse Width: 0.5 ms
Lead Channel Setting Sensing Sensitivity: 0.5 mV
Pulse Gen Serial Number: 7072725

## 2021-07-30 NOTE — Progress Notes (Signed)
Remote ICD transmission.   

## 2021-08-13 ENCOUNTER — Telehealth: Payer: Self-pay

## 2021-08-13 NOTE — Telephone Encounter (Signed)
[facility] alert for exceed AT/AF AF ongoing from 2/20 @ 01:39, controlled ventricular response Burden 1.2%, Eliquis 2 NSVT, no EGM's Route to triage per protocol [place]  Transmission reviewed. Successful telephone call to patient to address symptoms of AF as patient has a known history and is on [place]. Burden 1.3%. Patient states he feels well however he has only been on his medications for approximately 1 week. This includes eliquis and beta-blocker. Per patient he is receiving [facility] and his medications were recently reconciled. Assisted patient with sending manual transmission. He remains in AF with presenting V rate varying in the 110s. Patient standing during transmission. He is provided device clinic contact if additional symptoms occur. Will continue to monitor alerts.

## 2021-08-24 ENCOUNTER — Encounter (HOSPITAL_COMMUNITY): Payer: Self-pay

## 2021-08-24 ENCOUNTER — Emergency Department (HOSPITAL_COMMUNITY): Payer: Medicare Other

## 2021-08-24 ENCOUNTER — Other Ambulatory Visit (HOSPITAL_COMMUNITY): Payer: Medicare Other

## 2021-08-24 ENCOUNTER — Other Ambulatory Visit: Payer: Self-pay

## 2021-08-24 ENCOUNTER — Inpatient Hospital Stay (HOSPITAL_COMMUNITY)
Admission: EM | Admit: 2021-08-24 | Discharge: 2021-09-07 | DRG: 286 | Disposition: A | Discharge status: Hospice | Payer: Medicare Other | Attending: Internal Medicine | Admitting: Internal Medicine

## 2021-08-24 DIAGNOSIS — Z972 Presence of dental prosthetic device (complete) (partial): Secondary | ICD-10-CM

## 2021-08-24 DIAGNOSIS — Z880 Allergy status to penicillin: Secondary | ICD-10-CM

## 2021-08-24 DIAGNOSIS — Z79899 Other long term (current) drug therapy: Secondary | ICD-10-CM

## 2021-08-24 DIAGNOSIS — N261 Atrophy of kidney (terminal): Secondary | ICD-10-CM | POA: Diagnosis not present

## 2021-08-24 DIAGNOSIS — I110 Hypertensive heart disease with heart failure: Secondary | ICD-10-CM | POA: Diagnosis not present

## 2021-08-24 DIAGNOSIS — N184 Chronic kidney disease, stage 4 (severe): Secondary | ICD-10-CM

## 2021-08-24 DIAGNOSIS — I2510 Atherosclerotic heart disease of native coronary artery without angina pectoris: Secondary | ICD-10-CM | POA: Diagnosis present

## 2021-08-24 DIAGNOSIS — Z888 Allergy status to other drugs, medicaments and biological substances status: Secondary | ICD-10-CM

## 2021-08-24 DIAGNOSIS — Z9115 Patient's noncompliance with renal dialysis: Secondary | ICD-10-CM

## 2021-08-24 DIAGNOSIS — F17290 Nicotine dependence, other tobacco product, uncomplicated: Secondary | ICD-10-CM | POA: Diagnosis present

## 2021-08-24 DIAGNOSIS — D649 Anemia, unspecified: Secondary | ICD-10-CM | POA: Diagnosis not present

## 2021-08-24 DIAGNOSIS — I472 Ventricular tachycardia, unspecified: Secondary | ICD-10-CM | POA: Diagnosis not present

## 2021-08-24 DIAGNOSIS — M109 Gout, unspecified: Secondary | ICD-10-CM | POA: Diagnosis present

## 2021-08-24 DIAGNOSIS — I272 Pulmonary hypertension, unspecified: Secondary | ICD-10-CM | POA: Diagnosis not present

## 2021-08-24 DIAGNOSIS — Z720 Tobacco use: Secondary | ICD-10-CM | POA: Diagnosis not present

## 2021-08-24 DIAGNOSIS — I5043 Acute on chronic combined systolic (congestive) and diastolic (congestive) heart failure: Secondary | ICD-10-CM

## 2021-08-24 DIAGNOSIS — Z20822 Contact with and (suspected) exposure to covid-19: Secondary | ICD-10-CM | POA: Diagnosis not present

## 2021-08-24 DIAGNOSIS — F17210 Nicotine dependence, cigarettes, uncomplicated: Secondary | ICD-10-CM | POA: Diagnosis present

## 2021-08-24 DIAGNOSIS — Z8701 Personal history of pneumonia (recurrent): Secondary | ICD-10-CM

## 2021-08-24 DIAGNOSIS — Z91199 Patient's noncompliance with other medical treatment and regimen due to unspecified reason: Secondary | ICD-10-CM

## 2021-08-24 DIAGNOSIS — I480 Paroxysmal atrial fibrillation: Secondary | ICD-10-CM

## 2021-08-24 DIAGNOSIS — I7143 Infrarenal abdominal aortic aneurysm, without rupture: Secondary | ICD-10-CM | POA: Diagnosis not present

## 2021-08-24 DIAGNOSIS — E785 Hyperlipidemia, unspecified: Secondary | ICD-10-CM | POA: Diagnosis not present

## 2021-08-24 DIAGNOSIS — I252 Old myocardial infarction: Secondary | ICD-10-CM

## 2021-08-24 DIAGNOSIS — I5082 Biventricular heart failure: Secondary | ICD-10-CM | POA: Diagnosis present

## 2021-08-24 DIAGNOSIS — N179 Acute kidney failure, unspecified: Secondary | ICD-10-CM | POA: Diagnosis not present

## 2021-08-24 DIAGNOSIS — Z7901 Long term (current) use of anticoagulants: Secondary | ICD-10-CM | POA: Diagnosis not present

## 2021-08-24 DIAGNOSIS — J9811 Atelectasis: Secondary | ICD-10-CM | POA: Diagnosis not present

## 2021-08-24 DIAGNOSIS — Z515 Encounter for palliative care: Secondary | ICD-10-CM | POA: Diagnosis not present

## 2021-08-24 DIAGNOSIS — I517 Cardiomegaly: Secondary | ICD-10-CM | POA: Diagnosis not present

## 2021-08-24 DIAGNOSIS — I509 Heart failure, unspecified: Secondary | ICD-10-CM | POA: Diagnosis not present

## 2021-08-24 DIAGNOSIS — Z95810 Presence of automatic (implantable) cardiac defibrillator: Secondary | ICD-10-CM

## 2021-08-24 DIAGNOSIS — N186 End stage renal disease: Secondary | ICD-10-CM | POA: Diagnosis not present

## 2021-08-24 DIAGNOSIS — E8779 Other fluid overload: Secondary | ICD-10-CM | POA: Diagnosis not present

## 2021-08-24 DIAGNOSIS — Z955 Presence of coronary angioplasty implant and graft: Secondary | ICD-10-CM

## 2021-08-24 DIAGNOSIS — Z91013 Allergy to seafood: Secondary | ICD-10-CM

## 2021-08-24 DIAGNOSIS — E7800 Pure hypercholesterolemia, unspecified: Secondary | ICD-10-CM | POA: Diagnosis present

## 2021-08-24 DIAGNOSIS — J449 Chronic obstructive pulmonary disease, unspecified: Secondary | ICD-10-CM | POA: Diagnosis not present

## 2021-08-24 DIAGNOSIS — R319 Hematuria, unspecified: Secondary | ICD-10-CM | POA: Diagnosis not present

## 2021-08-24 DIAGNOSIS — I959 Hypotension, unspecified: Secondary | ICD-10-CM | POA: Diagnosis not present

## 2021-08-24 DIAGNOSIS — D696 Thrombocytopenia, unspecified: Secondary | ICD-10-CM

## 2021-08-24 DIAGNOSIS — I132 Hypertensive heart and chronic kidney disease with heart failure and with stage 5 chronic kidney disease, or end stage renal disease: Principal | ICD-10-CM | POA: Diagnosis present

## 2021-08-24 DIAGNOSIS — I130 Hypertensive heart and chronic kidney disease with heart failure and stage 1 through stage 4 chronic kidney disease, or unspecified chronic kidney disease: Secondary | ICD-10-CM | POA: Diagnosis not present

## 2021-08-24 DIAGNOSIS — Z7189 Other specified counseling: Secondary | ICD-10-CM | POA: Diagnosis not present

## 2021-08-24 DIAGNOSIS — N5089 Other specified disorders of the male genital organs: Secondary | ICD-10-CM | POA: Diagnosis not present

## 2021-08-24 DIAGNOSIS — I10 Essential (primary) hypertension: Secondary | ICD-10-CM

## 2021-08-24 DIAGNOSIS — M7989 Other specified soft tissue disorders: Secondary | ICD-10-CM | POA: Diagnosis not present

## 2021-08-24 DIAGNOSIS — R609 Edema, unspecified: Secondary | ICD-10-CM | POA: Diagnosis not present

## 2021-08-24 DIAGNOSIS — Z91030 Bee allergy status: Secondary | ICD-10-CM

## 2021-08-24 DIAGNOSIS — I714 Abdominal aortic aneurysm, without rupture, unspecified: Secondary | ICD-10-CM | POA: Diagnosis present

## 2021-08-24 DIAGNOSIS — N189 Chronic kidney disease, unspecified: Secondary | ICD-10-CM | POA: Diagnosis not present

## 2021-08-24 DIAGNOSIS — M1990 Unspecified osteoarthritis, unspecified site: Secondary | ICD-10-CM | POA: Diagnosis present

## 2021-08-24 DIAGNOSIS — Z9114 Patient's other noncompliance with medication regimen: Secondary | ICD-10-CM

## 2021-08-24 DIAGNOSIS — I255 Ischemic cardiomyopathy: Secondary | ICD-10-CM

## 2021-08-24 LAB — TSH: TSH: 2.135 u[IU]/mL (ref 0.350–4.500)

## 2021-08-24 LAB — MAGNESIUM: Magnesium: 1.7 mg/dL (ref 1.7–2.4)

## 2021-08-24 LAB — COMPREHENSIVE METABOLIC PANEL
ALT: 13 U/L (ref 0–44)
AST: 17 U/L (ref 15–41)
Albumin: 3.4 g/dL — ABNORMAL LOW (ref 3.5–5.0)
Alkaline Phosphatase: 50 U/L (ref 38–126)
Anion gap: 9 (ref 5–15)
BUN: 51 mg/dL — ABNORMAL HIGH (ref 8–23)
CO2: 18 mmol/L — ABNORMAL LOW (ref 22–32)
Calcium: 9.5 mg/dL (ref 8.9–10.3)
Chloride: 114 mmol/L — ABNORMAL HIGH (ref 98–111)
Creatinine, Ser: 3.76 mg/dL — ABNORMAL HIGH (ref 0.61–1.24)
GFR, Estimated: 15 mL/min — ABNORMAL LOW (ref >60)
Glucose, Bld: 104 mg/dL — ABNORMAL HIGH (ref 70–99)
Potassium: 4.9 mmol/L (ref 3.5–5.1)
Sodium: 141 mmol/L (ref 135–145)
Total Bilirubin: 0.9 mg/dL (ref 0.3–1.2)
Total Protein: 6.2 g/dL — ABNORMAL LOW (ref 6.5–8.1)

## 2021-08-24 LAB — CBC WITH DIFFERENTIAL/PLATELET
Abs Immature Granulocytes: 0.03 10*3/uL (ref 0.00–0.07)
Basophils Absolute: 0 10*3/uL (ref 0.0–0.1)
Basophils Relative: 0 %
Eosinophils Absolute: 0.1 10*3/uL (ref 0.0–0.5)
Eosinophils Relative: 1 %
HCT: 44.2 % (ref 39.0–52.0)
Hemoglobin: 13.7 g/dL (ref 13.0–17.0)
Immature Granulocytes: 0 %
Lymphocytes Relative: 18 %
Lymphs Abs: 1.7 10*3/uL (ref 0.7–4.0)
MCH: 27.1 pg (ref 26.0–34.0)
MCHC: 31 g/dL (ref 30.0–36.0)
MCV: 87.5 fL (ref 80.0–100.0)
Monocytes Absolute: 1 10*3/uL (ref 0.1–1.0)
Monocytes Relative: 10 %
Neutro Abs: 6.8 10*3/uL (ref 1.7–7.7)
Neutrophils Relative %: 71 %
Platelets: 130 10*3/uL — ABNORMAL LOW (ref 150–400)
RBC: 5.05 MIL/uL (ref 4.22–5.81)
RDW: 16.1 % — ABNORMAL HIGH (ref 11.5–15.5)
WBC: 9.6 10*3/uL (ref 4.0–10.5)
nRBC: 0 % (ref 0.0–0.2)

## 2021-08-24 LAB — I-STAT CHEM 8, ED
BUN: 57 mg/dL — ABNORMAL HIGH (ref 8–23)
Calcium, Ion: 1.2 mmol/L (ref 1.15–1.40)
Chloride: 114 mmol/L — ABNORMAL HIGH (ref 98–111)
Creatinine, Ser: 3.9 mg/dL — ABNORMAL HIGH (ref 0.61–1.24)
Glucose, Bld: 98 mg/dL (ref 70–99)
HCT: 46 % (ref 39.0–52.0)
Hemoglobin: 15.6 g/dL (ref 13.0–17.0)
Potassium: 5 mmol/L (ref 3.5–5.1)
Sodium: 143 mmol/L (ref 135–145)
TCO2: 23 mmol/L (ref 22–32)

## 2021-08-24 LAB — RESP PANEL BY RT-PCR (FLU A&B, COVID) ARPGX2
Influenza A by PCR: NEGATIVE
Influenza B by PCR: NEGATIVE
SARS Coronavirus 2 by RT PCR: NEGATIVE

## 2021-08-24 LAB — URINALYSIS, ROUTINE W REFLEX MICROSCOPIC
Bilirubin Urine: NEGATIVE
Glucose, UA: NEGATIVE mg/dL
Ketones, ur: NEGATIVE mg/dL
Leukocytes,Ua: NEGATIVE
Nitrite: NEGATIVE
Protein, ur: 100 mg/dL — AB
RBC / HPF: >50 RBC/hpf — ABNORMAL HIGH (ref 0–5)
Specific Gravity, Urine: 1.018 (ref 1.005–1.030)
pH: 5 (ref 5.0–8.0)

## 2021-08-24 LAB — BRAIN NATRIURETIC PEPTIDE: B Natriuretic Peptide: 3914.4 pg/mL — ABNORMAL HIGH (ref 0.0–100.0)

## 2021-08-24 LAB — TROPONIN I (HIGH SENSITIVITY)
Troponin I (High Sensitivity): 113 ng/L (ref <18)
Troponin I (High Sensitivity): 105 ng/L (ref <18)

## 2021-08-24 MED ORDER — EZETIMIBE 10 MG PO TABS
10.0000 mg | ORAL_TABLET | Freq: Every day | ORAL | Status: DC
  Administered 2021-08-24 – 2021-09-07 (×15): 10 mg via ORAL
  Filled 2021-08-24 (×15): qty 1

## 2021-08-24 MED ORDER — FUROSEMIDE 10 MG/ML IJ SOLN: 40.0000 mg | Freq: Two times a day (BID) | INTRAMUSCULAR | Status: DC

## 2021-08-24 MED ORDER — POTASSIUM CHLORIDE CRYS ER 10 MEQ PO TBCR
10.0000 meq | EXTENDED_RELEASE_TABLET | Freq: Every day | ORAL | Status: DC
  Administered 2021-08-25 – 2021-08-26 (×2): 10 meq via ORAL
  Filled 2021-08-24 (×2): qty 1

## 2021-08-24 MED ORDER — APIXABAN 2.5 MG PO TABS
2.5000 mg | ORAL_TABLET | Freq: Every day | ORAL | Status: DC
  Administered 2021-08-24 – 2021-08-27 (×4): 2.5 mg via ORAL
  Filled 2021-08-24 (×4): qty 1

## 2021-08-24 MED ORDER — OXYCODONE-ACETAMINOPHEN 5-325 MG PO TABS
1.0000 | ORAL_TABLET | Freq: Four times a day (QID) | ORAL | Status: DC | PRN
  Administered 2021-08-31 – 2021-09-02 (×4): 1 via ORAL
  Filled 2021-08-24 (×4): qty 1

## 2021-08-24 MED ORDER — FUROSEMIDE 10 MG/ML IJ SOLN
120.0000 mg | Freq: Two times a day (BID) | INTRAVENOUS | Status: DC
  Administered 2021-08-24 – 2021-08-25 (×2): 120 mg via INTRAVENOUS
  Filled 2021-08-24 (×2): qty 12
  Filled 2021-08-24: qty 10

## 2021-08-24 MED ORDER — ATORVASTATIN CALCIUM 80 MG PO TABS
80.0000 mg | ORAL_TABLET | Freq: Every day | ORAL | Status: DC
Start: 2021-08-24 — End: 2021-09-07
  Administered 2021-08-24 – 2021-09-07 (×15): 80 mg via ORAL
  Filled 2021-08-24 (×10): qty 1
  Filled 2021-08-24: qty 2
  Filled 2021-08-24 (×4): qty 1

## 2021-08-24 MED ORDER — ONDANSETRON HCL 4 MG/2ML IJ SOLN: 4.0000 mg | Freq: Four times a day (QID) | INTRAMUSCULAR | Status: DC | PRN

## 2021-08-24 MED ORDER — SODIUM CHLORIDE 0.9 % IV SOLN: 250.0000 mL | INTRAVENOUS | Status: DC | PRN

## 2021-08-24 MED ORDER — SODIUM CHLORIDE 0.9% FLUSH: 3.0000 mL | INTRAVENOUS | Status: DC | PRN

## 2021-08-24 MED ORDER — SODIUM CHLORIDE 0.9% FLUSH
3.0000 mL | Freq: Two times a day (BID) | INTRAVENOUS | Status: DC
  Administered 2021-08-24 – 2021-08-29 (×10): 3 mL via INTRAVENOUS
  Administered 2021-08-31: 10 mL via INTRAVENOUS
  Administered 2021-09-02 – 2021-09-07 (×10): 3 mL via INTRAVENOUS

## 2021-08-24 MED ORDER — MAGNESIUM SULFATE IN D5W 1-5 GM/100ML-% IV SOLN
1.0000 g | Freq: Once | INTRAVENOUS | Status: AC
  Administered 2021-08-24: 1 g via INTRAVENOUS
  Filled 2021-08-24: qty 100

## 2021-08-24 MED ORDER — FUROSEMIDE 10 MG/ML IJ SOLN
80.0000 mg | Freq: Once | INTRAMUSCULAR | Status: AC
  Administered 2021-08-24: 80 mg via INTRAVENOUS
  Filled 2021-08-24: qty 8

## 2021-08-24 MED ORDER — ACETAMINOPHEN 325 MG PO TABS
650.0000 mg | ORAL_TABLET | ORAL | Status: DC | PRN
  Administered 2021-08-31 – 2021-09-06 (×3): 650 mg via ORAL
  Filled 2021-08-24 (×4): qty 2

## 2021-08-24 MED ORDER — ISOSORBIDE MONONITRATE ER 60 MG PO TB24
60.0000 mg | ORAL_TABLET | Freq: Every day | ORAL | Status: DC
Start: 2021-08-25 — End: 2021-08-25
  Administered 2021-08-25: 60 mg via ORAL
  Filled 2021-08-24: qty 1

## 2021-08-24 MED ORDER — CARVEDILOL 12.5 MG PO TABS
25.0000 mg | ORAL_TABLET | Freq: Every day | ORAL | Status: DC
Start: 2021-08-24 — End: 2021-08-25
  Administered 2021-08-24: 25 mg via ORAL
  Filled 2021-08-24: qty 2

## 2021-08-24 NOTE — Progress Notes (Signed)
Patient was reported to have magnesium 1.7 and high-sensitivity troponin of 117.  Patient denies any complaints of chest pain.  Possibly secondary to demand in setting of CHF exacerbation.  Continue to trend cardiac troponin. ?

## 2021-08-24 NOTE — Assessment & Plan Note (Signed)
Patient has a known AAA last evaluated by ultrasound 05/2021 noted to be 4 cm at that time. ?

## 2021-08-24 NOTE — ED Provider Notes (Signed)
[facility] EMERGENCY DEPARTMENT Provider Note   CSN: [id number] Arrival date & time: 08/24/21  0116     History  Chief Complaint  Patient presents with   Leg Swelling    Anthony Foster is a 81 y.o. male.  Patient presents to the emergency department for evaluation of generalized swelling.  Patient reports that he has noticed progressive swelling of his lower extremities over the last 3 days.  Patient reports that the swelling now goes all the way up to his abdomen and his testicles are swollen.  He has had decreased urine output today.  He does not feel any significant urgency to urinate.  No abdominal or bladder pain.  Patient reports that he recently had an acute illness including diarrhea and he stopped taking all of his medications.  He only recently restarted his meds.  He has a history of chronic renal failure.  He is supposed to be taking Lasix, unclear if he is taking it currently.  Patient reports that he does normally urinate several times a day, has not urinated since yesterday morning.      Home Medications Prior to Admission medications   Medication Sig Start Date End Date Taking? Authorizing Provider  allopurinol (ZYLOPRIM) 300 MG tablet Take 300 mg by mouth daily. 07/26/16  Yes [provider]  apixaban (ELIQUIS) 2.5 MG TABS tablet Take 1 tablet (2.5 mg total) by mouth daily. 06/10/19  Yes Lelon Perla, MD  atorvastatin (LIPITOR) 80 MG tablet Take 1 tablet (80 mg total) by mouth daily. 09/12/15  Yes Lelon Perla, MD  carvedilol (COREG) 25 MG tablet Take 25 mg by mouth daily.  09/21/16  Yes [provider]  ezetimibe (ZETIA) 10 MG tablet Take 1 tablet (10 mg total) by mouth daily. 06/08/21 09/06/21 Yes Lelon Perla, MD  furosemide (LASIX) 40 MG tablet Take 40 mg by mouth daily. 07/21/16  Yes [provider]  hydrALAZINE (APRESOLINE) 25 MG tablet Take 1 tablet (25 mg total) by mouth 3 (three) times daily.  09/12/15  Yes Lelon Perla, MD  isosorbide mononitrate (IMDUR) 60 MG 24 hr tablet Take 1 tablet (60 mg total) by mouth daily. 01/22/18  Yes Kilroy, Luke K, PA-C  nitroGLYCERIN (NITROSTAT) 0.4 MG SL tablet Place 1 tablet (0.4 mg total) under the tongue every 5 (five) minutes as needed for chest pain. 01/14/18 08/24/21 Yes Kilroy, Doreene Burke, PA-C  oxyCODONE-acetaminophen (PERCOCET/ROXICET) 5-325 MG tablet Take 1 tablet by mouth every 6 (six) hours as needed. 11/26/18  Yes Laurence Slate M, PA-C  potassium chloride (KLOR-CON M) 10 MEQ tablet Take 10 mEq by mouth daily. 08/01/21  Yes [provider]  PROAIR RESPICLICK 277 (90 Base) MCG/ACT AEPB Inhale 1 puff into the lungs every 4 (four) hours as needed (shortness of breath). 08/02/21  Yes [provider]      Allergies    Hymenoptera venom preparations, Lisinopril, Piroxicam, Shellfish allergy, and Amoxicillin-pot clavulanate    Review of Systems   Review of Systems  Cardiovascular:  Positive for leg swelling.  Genitourinary:  Positive for decreased urine volume.   Physical Exam Updated Vital Signs BP 126/78    Pulse 90    Temp (!) 97.4 F (36.3 C) (Oral)    Resp (!) 23    Ht 5\' 8"  (1.727 m)    SpO2 100%    BMI 25.27 kg/m  Physical Exam Vitals and nursing note reviewed.  Constitutional:      General: He  is not in acute distress.    Appearance: He is well-developed.  HENT:     Head: Normocephalic and atraumatic.     Mouth/Throat:     Mouth: Mucous membranes are moist.  Eyes:     General: Vision grossly intact. Gaze aligned appropriately.     Extraocular Movements: Extraocular movements intact.     Conjunctiva/sclera: Conjunctivae normal.  Cardiovascular:     Rate and Rhythm: Normal rate and regular rhythm.     Pulses: Normal pulses.     Heart sounds: Normal heart sounds, S1 normal and S2 normal. No murmur heard.   No friction rub. No gallop.  Pulmonary:     Effort: Pulmonary effort is normal. No respiratory distress.      Breath sounds: Normal breath sounds.  Abdominal:     Palpations: Abdomen is soft.     Tenderness: There is no abdominal tenderness. There is no guarding or rebound.     Hernia: No hernia is present.  Musculoskeletal:        General: No swelling.     Cervical back: Full passive range of motion without pain, normal range of motion and neck supple. No pain with movement, spinous process tenderness or muscular tenderness. Normal range of motion.     Right lower leg: 3+ Pitting Edema present.     Left lower leg: 3+ Pitting Edema present.  Skin:    General: Skin is warm and dry.     Capillary Refill: Capillary refill takes less than 2 seconds.     Findings: No ecchymosis, erythema, lesion or wound.  Neurological:     Mental Status: He is alert and oriented to person, place, and time.     GCS: GCS eye subscore is 4. GCS verbal subscore is 5. GCS motor subscore is 6.     Cranial Nerves: Cranial nerves 2-12 are intact.     Sensory: Sensation is intact.     Motor: Motor function is intact. No weakness or abnormal muscle tone.     Coordination: Coordination is intact.  Psychiatric:        Mood and Affect: Mood normal.        Speech: Speech normal.        Behavior: Behavior normal.    ED Results / Procedures / Treatments   Labs (all labs ordered are listed, but only abnormal results are displayed) Labs Reviewed  CBC WITH DIFFERENTIAL/PLATELET - Abnormal; Notable for the following components:      Result Value   RDW 16.1 (*)    Platelets 130 (*)    All other components within normal limits  COMPREHENSIVE METABOLIC PANEL - Abnormal; Notable for the following components:   Chloride 114 (*)    CO2 18 (*)    Glucose, Bld 104 (*)    BUN 51 (*)    Creatinine, Ser 3.76 (*)    Total Protein 6.2 (*)    Albumin 3.4 (*)    GFR, Estimated 15 (*)    All other components within normal limits  BRAIN NATRIURETIC PEPTIDE - Abnormal; Notable for the following components:   B Natriuretic Peptide  3,914.4 (*)    All other components within normal limits  URINALYSIS, ROUTINE W REFLEX MICROSCOPIC - Abnormal; Notable for the following components:   Color, Urine AMBER (*)    APPearance HAZY (*)    Hgb urine dipstick LARGE (*)    Protein, ur 100 (*)    RBC / HPF >50 (*)    Bacteria,  UA RARE (*)    All other components within normal limits  I-STAT CHEM 8, ED - Abnormal; Notable for the following components:   Chloride 114 (*)    BUN 57 (*)    Creatinine, Ser 3.90 (*)    All other components within normal limits  RESP PANEL BY RT-PCR (FLU A&B, COVID) [id number]    EKG EKG Interpretation  Date/Time:  Friday August 24 2021 01:32:07 EST Ventricular Rate:  97 PR Interval:    QRS Duration: 99 QT Interval:  377 QTC Calculation: 479 R Axis:   -55 Text Interpretation: Atrial fibrillation Left anterior fascicular block Consider anterior infarct Nonspecific T abnormalities, lateral leads Confirmed by Orpah Greek ([id number]) on 08/24/2021 5:31:57 AM  Radiology DG Chest Port 1 View  Result Date: 08/24/2021 CLINICAL DATA:  Edema. EXAM: PORTABLE CHEST 1 VIEW COMPARISON:  Chest radiograph dated 01/04/2018. FINDINGS: Shallow inspiration. Bibasilar atelectasis. Pneumonia is not excluded. Probable trace left pleural effusion. No pneumothorax. Mild cardiomegaly. Left pectoral AICD device. Atherosclerotic calcification of the aorta. No acute osseous pathology. Degenerative changes of spine. IMPRESSION: Shallow inspiration with bibasilar atelectasis. Pneumonia is not excluded. Possible small left pleural effusion. Electronically Signed   By: Anner Crete M.D.   On: 08/24/2021 03:18    Procedures Procedures    Medications Ordered in ED Medications  furosemide (LASIX) injection 80 mg (80 mg Intravenous Given 08/24/21 0559)    ED Course/ Medical Decision Making/ A&P                           Medical Decision Making Amount and/or Complexity of Data Reviewed External Data Reviewed: labs,  radiology and ECG. Labs: ordered. Decision-making details documented in ED Course. Radiology: ordered and independent interpretation performed. Decision-making details documented in ED Course. ECG/medicine tests: ordered and independent interpretation performed. Decision-making details documented in ED Course.  Risk Prescription drug management.   Patient presents to the emergency department for evaluation of swelling and decreased urine output.  Differential diagnosis considered includes acute decompensated congestive heart failure, acute renal failure, anasarca  Records reviewed reveal the patient has not had an echo since 2017.  He does have a history of ischemic cardiomyopathy with ejection fraction of 35 to 40%.  Additionally, patient has an ICD in place.  EKG reveals atrial fibrillation with frequent PVCs.  No obvious ischemia.  Patient has a known history of atrial fibrillation, anticoagulated on Eliquis.  Patient with significant pitting edema of lower extremities to lower abdomen.  Patient reports that he has been off of his medications for some time secondary to a GI illness that was recent.  He appears to be severely decompensated from a congestive heart failure standpoint.  Will require hospitalization for diuresis.  Patient noted to have blood in his urine.  He reports decreased urine output.  He has a history of chronic renal insufficiency and his creatinine is about normal baseline.  Signs of infection by UA.  CT scan pending.  Will admit to hospitalist for further management of congestive heart failure exacerbation secondary to noncompliance with medications.        Final Clinical Impression(s) / ED Diagnoses Final diagnoses:  Acute on chronic congestive heart failure, unspecified heart failure type [facility])    Rx / DC Orders ED Discharge Orders     None         Orpah Greek, MD 08/24/21 [phone]

## 2021-08-24 NOTE — ED Notes (Addendum)
Pt resting comfortably

## 2021-08-24 NOTE — Consult Note (Addendum)
Cardiology Consultation:   Patient ID: Anthony Foster MRN: [id number]; DOB: 1941-05-31  Admit date: 08/24/2021 Date of Consult: 08/24/2021  PCP:  Wenda Low, MD   [facility] Providers Cardiologist:  Kirk Ruths, MD   Patient Profile:   Anthony Foster is a 81 y.o. male with a hx of PAF on 2.5 mg Eliquis twice daily, CAD, hyperlipidemia, hypertension, ischemic cardiomyopathy, chronic systolic and diastolic heart failure, and AAA who is being seen 08/24/2021 for the evaluation of CHF at the request of Dr. Tamala Julian.  History of Present Illness:   Anthony Foster received care previously in [facility].  He has CAD and had a stent placed in an artery in 2012.  In 2014 he had a heart cath but no stents placed in [facility] followed by a [employer] ICD 07/30/2012.  He was admitted January 2017 with CHF, atrial fibrillation, CAD, ischemic cardiomyopathy with an ejection fraction of 35 to 40%.  Echocardiogram April 2017 showed LVEF 35 to 40%, grade 2 diastolic dysfunction, moderate left atrial enlargement, and mild TR.  Nuclear stress test in July 2019 with an EF of 40% inferior scar versus attenuation and increased pulmonary uptake.  Abdominal ultrasound December 2022 with a 4 cm abdominal aortic aneurysm.  He is followed by Dr. Stanford Breed and was last seen 06/07/2021 and was doing well at that time.  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  He follows with [facility] and has upper extremity fistula but has not yet started HD.   Last evening, he returned home from bowling and noted swollen testicles. Over the last three days he had noted lower extremity swelling. Apparently he had diarrhea in Nov 2022 and stopped taking all of his medications and restarted medications in the last 2-3 weeks.   sCr 3.76 --> 3.90  GFR 15 Albumin 3.4 BNP 3915 Mg 1.7 HS troponin 113, delta pending  CXR with bibasilar atelectasis, small left  pleural effusion CT renal stone protocol: anasarca, cardiomegaly, moderate layering pleural effusions, ascites, body wall edema, enlarged scroum related to bilateral scrotal hydyroceles, infrarenal abdominal aortic aneurysm 40 mm.   Cardiology asked to evaluate for CHF.   During my interview, he confirms that he did not take any medication, including eliquis, for Nov, Dec, Jan, and most of Feb. He restarted medications about 2 weeks ago. He was concerned the medications were giving him diarrhea, which he states he had for a month. He denies chest pain and is unaware of his Afib. He denies recent syncope and orthopnea/PND. He continues to smoke cigarettes, but only about 5 per week.   Past Medical History:  Diagnosis Date   AAA (abdominal aortic aneurysm)    3.5 cm 04/2018   Atrial fibrillation (HCC)    CHF (congestive heart failure) ([place])    Coronary artery disease    Prior stent   Degenerative joint disease    Full dentures    Gout    Hypercholesteremia    Hypertension    ICD (implantable cardioverter-defibrillator) battery depletion    Ischemic cardiomyopathy    Pneumonia    Renal insufficiency    Wears glasses     Past Surgical History:  Procedure Laterality Date   AV FISTULA PLACEMENT Right 11/26/2018   Procedure:     Right arm Brachiocephalic Fistula Creation   ;  Surgeon: Waynetta Sandy, MD;  Location: [place];  Service: Vascular;  Laterality: Right;   BACK  SURGERY     CARDIAC SURGERY     CATARACT EXTRACTION W/ INTRAOCULAR LENS  IMPLANT, BILATERAL     COLONOSCOPY W/ BIOPSIES AND POLYPECTOMY     MASS EXCISION     neck   MULTIPLE TOOTH EXTRACTIONS     PACEMAKER PLACEMENT     SHOULDER SURGERY     left   TOTAL SHOULDER REPLACEMENT     WRIST SURGERY       Home Medications:  Prior to Admission medications   Medication Sig Start Date End Date Taking? Authorizing Provider  allopurinol (ZYLOPRIM) 300 MG tablet Take 300 mg by mouth daily. 07/26/16  Yes [provider]  apixaban (ELIQUIS) 2.5 MG TABS tablet Take 1 tablet (2.5 mg total) by mouth daily. 06/10/19  Yes Lelon Perla, MD  atorvastatin (LIPITOR) 80 MG tablet Take 1 tablet (80 mg total) by mouth daily. 09/12/15  Yes Lelon Perla, MD  carvedilol (COREG) 25 MG tablet Take 25 mg by mouth daily.  09/21/16  Yes [provider]  ezetimibe (ZETIA) 10 MG tablet Take 1 tablet (10 mg total) by mouth daily. 06/08/21 09/06/21 Yes Lelon Perla, MD  furosemide (LASIX) 40 MG tablet Take 40 mg by mouth daily. 07/21/16  Yes [provider]  hydrALAZINE (APRESOLINE) 25 MG tablet Take 1 tablet (25 mg total) by mouth 3 (three) times daily. 09/12/15  Yes Lelon Perla, MD  isosorbide mononitrate (IMDUR) 60 MG 24 hr tablet Take 1 tablet (60 mg total) by mouth daily. 01/22/18  Yes Kilroy, Luke K, PA-C  nitroGLYCERIN (NITROSTAT) 0.4 MG SL tablet Place 1 tablet (0.4 mg total) under the tongue every 5 (five) minutes as needed for chest pain. 01/14/18 08/24/21 Yes Kilroy, Doreene Burke, PA-C  oxyCODONE-acetaminophen (PERCOCET/ROXICET) 5-325 MG tablet Take 1 tablet by mouth every 6 (six) hours as needed. 11/26/18  Yes Laurence Slate M, PA-C  potassium chloride (KLOR-CON M) 10 MEQ tablet Take 10 mEq by mouth daily. 08/01/21  Yes [provider]  PROAIR RESPICLICK 878 (90 Base) MCG/ACT AEPB Inhale 1 puff into the lungs every 4 (four) hours as needed (shortness of breath). 08/02/21  Yes [provider]    Inpatient Medications: Scheduled Meds:  apixaban  2.5 mg Oral Daily   atorvastatin  80 mg Oral Daily   carvedilol  25 mg Oral Daily   ezetimibe  10 mg Oral Daily   furosemide  40 mg Intravenous BID   [START ON 08/25/2021] isosorbide mononitrate  60 mg Oral Daily   [START ON 08/25/2021] potassium chloride  10 mEq Oral Daily   sodium chloride flush  3 mL Intravenous Q12H   Continuous Infusions:  sodium chloride     magnesium sulfate bolus IVPB 1 g (08/24/21 1444)   PRN Meds: sodium  chloride, acetaminophen, ondansetron (ZOFRAN) IV, oxyCODONE-acetaminophen, sodium chloride flush  Allergies:    Allergies  Allergen Reactions   Hymenoptera Venom Preparations Hives    Reaction to wasps   Lisinopril Other (See Comments)    angioedema   Piroxicam Other (See Comments)    burn   Shellfish Allergy Swelling   Amoxicillin-Pot Clavulanate Rash    Did it involve swelling of the face/tongue/throat, SOB, or low BP? No Did it involve sudden or severe rash/hives, skin peeling, or any reaction on the inside of your mouth or nose? Yes Did you need to seek medical attention at a hospital or doctor's office? Yes When did it last happen?      over  10 years If all above answers are NO, may proceed with cephalosporin use.     Social History:   Social History   Socioeconomic History   Marital status: Divorced    Spouse name: Not on file   Number of children: 4   Years of education: Not on file   Highest education level: Not on file  Occupational History   Not on file  Tobacco Use   Smoking status: Some Days    Types: Cigars   Smokeless tobacco: Never  Vaping Use   Vaping Use: Never used  Substance and Sexual Activity   Alcohol use: Not Currently    Alcohol/week: 0.0 standard drinks   Drug use: Never   Sexual activity: Not on file  Other Topics Concern   Not on file  Social History Narrative   Not on file   Social Determinants of Health   Financial Resource Strain: Not on file  Food Insecurity: Not on file  Transportation Needs: Not on file  Physical Activity: Not on file  Stress: Not on file  Social Connections: Not on file  Intimate Partner Violence: Not on file    Family History:    Family History  Problem Relation Age of Onset   Heart disease Other        No family history   Diabetes Sister      ROS:  Please see the history of present illness.   All other ROS reviewed and negative.     Physical Exam/Data:   Vitals:   08/24/21 1100 08/24/21  1200 08/24/21 1300 08/24/21 1426  BP: 127/73 103/67 109/68 127/66  Pulse: 80 81 93   Resp: 17 13 (!) 21 16  Temp:    97.7 F (36.5 C)  TempSrc:    Oral  SpO2: 95% 100% 99% 100%  Weight:    83.2 kg  Height:    5\' 8"  (1.727 m)    Intake/Output Summary (Last 24 hours) at 08/24/2021 1516 Last data filed at 08/24/2021 0411 Gross per 24 hour  Intake --  Output 40 ml  Net -40 ml   Last 3 Weights 08/24/2021 06/07/2021 10/05/2020  Weight (lbs) 183 lb 6.8 oz 166 lb 3.2 oz 181 lb  Weight (kg) 83.2 kg 75.388 kg 82.101 kg     Body mass index is 27.89 kg/m.  General:  Well nourished, well developed, in no acute distress HEENT: normal Neck: +JVD Vascular: No carotid bruits; Distal pulses 2+ bilaterally Cardiac:  irregular rhythm, regular rate, no murmur Lungs:  crackles throughout Abd: soft, nontender, no hepatomegaly  Ext: 3+ B LE edema up through abdomen Musculoskeletal:  No deformities, BUE and BLE strength normal and equal Skin: warm and dry  Neuro:  CNs 2-12 intact, no focal abnormalities noted Psych:  Normal affect   EKG:  The EKG was personally reviewed and demonstrates:  Afib ventricular rate 97, LAFB, PVC, old anterior infarct  Telemetry:  Telemetry was personally reviewed and demonstrates:  afib in the 80-90s  Relevant CV Studies:  Nuclear stress test 12/2017: Nuclear stress EF: 40%. The left ventricular ejection fraction is moderately decreased (30-44%). No T wave inversion was noted during stress. There was no ST segment deviation noted during stress. Defect 1: There is a medium defect of moderate severity. There is prominent right ventricular uptake.   Moderate size and intensity fixed inferior perfusion defect, suggestive of scar. There is adjacent bowel which may attenuate. LVEF 40% with inferior wall hypokinesis. RV uptake noted, suggestive of  increased pulmonary pressure. This is an intermediate risk study.    Echo 09/2015 Study Conclusions  - Left ventricle: The  cavity size was normal. Wall thickness was    increased in a pattern of mild LVH. Systolic function was    moderately reduced. The estimated ejection fraction was in the    range of 35% to 40%. Diffuse hypokinesis. Features are consistent    with a pseudonormal left ventricular filling pattern, with    concomitant abnormal relaxation and increased filling pressure    (grade 2 diastolic dysfunction). Doppler parameters are    consistent with elevated ventricular end-diastolic filling    pressure.  - Aortic valve: Transvalvular velocity was within the normal range.    There was no stenosis. There was no regurgitation.  - Mitral valve: There was no regurgitation.  - Left atrium: The atrium was moderately dilated.  - Right ventricle: The cavity size was normal. Wall thickness was    normal. Systolic function was normal.  - Atrial septum: No defect or patent foramen ovale was identified    by color flow Doppler.  - Tricuspid valve: There was mild regurgitation.  - Pulmonary arteries: Systolic pressure was within the normal    range. PA peak pressure: 24 mm Hg (S).  - Inferior vena cava: The vessel was normal in size. The    respirophasic diameter changes were in the normal range (>= 50%),    consistent with normal central venous pressure.    Laboratory Data:  High Sensitivity Troponin:   Recent Labs  Lab 08/24/21 1126  TROPONINIHS 113*     Chemistry Recent Labs  Lab 08/24/21 0145 08/24/21 0318 08/24/21 1126  NA 141 143  --   K 4.9 5.0  --   CL 114* 114*  --   CO2 18*  --   --   GLUCOSE 104* 98  --   BUN 51* 57*  --   CREATININE 3.76* 3.90*  --   CALCIUM 9.5  --   --   MG  --   --  1.7  GFRNONAA 15*  --   --   ANIONGAP 9  --   --     Recent Labs  Lab 08/24/21 0145  PROT 6.2*  ALBUMIN 3.4*  AST 17  ALT 13  ALKPHOS 50  BILITOT 0.9   Lipids No results for input(s): CHOL, TRIG, HDL, LABVLDL, LDLCALC, CHOLHDL in the last 168 hours.  Hematology Recent Labs  Lab  08/24/21 0145 08/24/21 0318  WBC 9.6  --   RBC 5.05  --   HGB 13.7 15.6  HCT 44.2 46.0  MCV 87.5  --   MCH 27.1  --   MCHC 31.0  --   RDW 16.1*  --   PLT 130*  --    Thyroid  Recent Labs  Lab 08/24/21 0959  TSH 2.135    BNP Recent Labs  Lab 08/24/21 0145  BNP 3,914.4*    DDimer No results for input(s): DDIMER in the last 168 hours.   Radiology/Studies:  DG Chest Port 1 View  Result Date: 08/24/2021 CLINICAL DATA:  Edema. EXAM: PORTABLE CHEST 1 VIEW COMPARISON:  Chest radiograph dated 01/04/2018. FINDINGS: Shallow inspiration. Bibasilar atelectasis. Pneumonia is not excluded. Probable trace left pleural effusion. No pneumothorax. Mild cardiomegaly. Left pectoral AICD device. Atherosclerotic calcification of the aorta. No acute osseous pathology. Degenerative changes of spine. IMPRESSION: Shallow inspiration with bibasilar atelectasis. Pneumonia is not excluded. Possible small left pleural effusion. Electronically Signed  By: Anner Crete M.D.   On: 08/24/2021 03:18   CT RENAL STONE STUDY  Result Date: 08/24/2021 CLINICAL DATA:  81 year old male with difficulty urinating, swollen testicles, lower extremity swelling, hematuria. EXAM: CT ABDOMEN AND PELVIS WITHOUT CONTRAST TECHNIQUE: Multidetector CT imaging of the abdomen and pelvis was performed following the standard protocol without IV contrast. RADIATION DOSE REDUCTION: This exam was performed according to the departmental dose-optimization program which includes automated exposure control, adjustment of the mA and/or kV according to patient size and/or use of iterative reconstruction technique. COMPARISON:  Portable chest 0302 hours today. FINDINGS: Lower chest: Layering bilateral pleural effusions with simple fluid density compatible with transudate. Moderate volume fluid on the right, small to moderate on the left. Mild cardiomegaly. Cardiac pacemaker leads. No pericardial effusion. Confluent compressive atelectasis in the  lower lobes. Hepatobiliary: Negative noncontrast liver and gallbladder. There is trace perihepatic free fluid. Pancreas: Partially atrophied. Spleen: Negative, no splenomegaly. Adrenals/Urinary Tract: Normal adrenal glands. There is a degree of bilateral renal cortical atrophy. Large but simple fluid density exophytic right upper pole renal cyst is about 8 cm diameter. No hydronephrosis. No nephrolithiasis. No hydroureter. Decompressed urinary bladder. Pelvic phleboliths but no urinary calculus identified. Stomach/Bowel: No dilated large or small bowel. Distal large bowel diverticulosis with no active inflammation. Right lower quadrant and pelvic inlet free fluid with simple fluid density, partially obscuring the appendix although the base of the appendix is normal on series 6, image 51. No large bowel wall thickening is evident. No free air. Decompressed stomach and duodenum. Vascular/Lymphatic: Aortoiliac calcified atherosclerosis. Infrarenal abdominal aortic aneurysm measures 40 mm diameter, and the aorta remains enlarged in a fusiform fashion to the aortoiliac bifurcation. 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 Vascular patency is not evaluated in the absence of IV contrast. No lymphadenopathy identified. Reproductive: Enlarged scrotum with evidence of bilateral simple fluid density scrotal hydrocele (series 3, image 22). No scrotal gas. No significant skin thickening. Bilateral proximal lower extremity generalized subcutaneous edema. Calcified femoral artery atherosclerosis. Other: Generalized body wall edema suggesting anasarca. Mild presacral stranding but no pelvic free fluid. Musculoskeletal: Advanced lumbar spine degeneration. Widespread lower thoracic through upper lumbar interbody ankylosis from flowing endplate osteophytes. Bilateral SI joint ankylosis also. But there is inconsistent lumbar ankylosis. No acute osseous abnormality identified. IMPRESSION: 1. Cardiomegaly.  Anasarca, with up to moderate layering pleural effusions, small volume ascites, and generalized body wall edema. 2. Enlarged scrotum appears related to bilateral scrotal hydroceles. No soft tissue gas. 3. No bowel obstruction or convincing bowel inflammation. Partial renal atrophy. No obstructive uropathy or urinary calculus identified. 4. Infrarenal abdominal aortic aneurysm measuring up to 40 mm diameter. Recommend follow-up every 12 months and vascular consultation. Reference: J Am Coll Radiol 5035;46:568-127. Aortic aneurysm NOS (ICD10-I71.9). Aortic Atherosclerosis (ICD10-I70.0). 5. Ankylosed SI joints. Incompletely ankylosed spine. 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 Electronically Signed   By: Genevie Ann M.D.   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 Noncompliance since Nov 2022 - LVEF ~ 40% on nuclear stress test 12/2017, 35-40% on echo 2017 - Corvue on 07/19/2021 was downtrending - Home regimen includes 25 mg hydralazine 3 times daily, 25 mg Coreg twice daily, 40 mg Lasix daily, 60 mg Imdur - pt stopped taking all medications in Nov due to diarrhea and restarted 2 weeks ago - interestingly was  not significantly hypertensive on arrival, but hypervolemic with edema to abdomen - has received 80 mg IV lasix x 1 dose - I&Os incomplete, just came to floor from ER - recommend starting with 120 mg IV lasix BID with strict I&Os and daily weights - will need to be cautious with electrolytes and renal function - sCr 3.90, baseline was 3.2 - repeat an echo to evaluate LVEF  - if he does not start adequately diuresing, may need right heart cath and possibly inotropic support  - would involve nephrology early in his hospitalization - suspect a high likelihood that he may need temporary dialysis to get volume off   Atrial fibrillation Chronic  anticoagulation AICD in place - PAF burden less than 1% on last interrogation - telemetry appears in Afib  with rate control - Maintained on Coreg and Eliquis-low-dose for age and renal function - question if we need to place on heparin if right heart cath needed - can follow diuresis tonight and decide tomorrow, had fistula in place, unclear if useable    CAD status post prior stenting Hyperlipidemia with LDL goal < 55 - Continue Coreg, statin - No aspirin in the setting of Eliquis - he does not complain of chest pain - would avoid angiography for now while not yet on HD   Abdominal aortic aneurysm - 40 mm on CT today - BP control with ideally SBP < 120   Ongoing smoking - has cut back to 5 cigarettes per week when not at home    Risk Assessment/Risk Scores:    New York Heart Association (NYHA) Functional Class NYHA Class IV  CHA2DS2-VASc Score = 5   This indicates a 7.2% annual risk of stroke. The patient's score is based upon: CHF History: 1 HTN History: 1 Diabetes History: 0 Stroke History: 0 Vascular Disease History: 1 Age Score: 2 Gender Score: 0      For questions or updates, please contact [facility] Please consult [email or web address] for contact info under    Signed, Ledora Bottcher, [place]  08/24/2021 3:16 PM   Patient seen and examined. Agree with assessment and plan.  Anthony Foster is a very pleasant 81 year old gentleman who is originally from [place] and has a history of paroxysmal atrial fibrillation on Eliquis 2.5 mg daily, CAD, hyperlipidemia, hypertension, ischemic cardiomyopathy, chronic systolic and diastolic heart failure, and known abdominal aortic aneurysm.  He is status postplacement of a [employer] ICD in February 2014 and is followed by Dr. Lovena Le.  He is followed by Dr. Reatha Armour for chronic kidney disease and sees Dr. Stanford Breed for cardiology.  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   He is status post AV fistula insertion for his potential future need for dialysis.  Creatinines have been in the mid threes.  He has been on a medical regimen consisting of carvedilol, hydralazine, Eliquis, furosemide, and atorvastatin in addition to isosorbide mononitrate but apparently in November had significant issues with recurrent diarrhea.  As result he had stopped all his medications.  When he was seen by Dr. Stanford Breed in December, he apparently he was not taking any of his home meds.  He reinitiated his home meds approximately 2 weeks ago.  Last evening he went to the bowling alley and was keeping score but not bowling but over the last several weeks has had progressive lower extremity edema and scrotal swelling, abdominal bloatedness.  He presented to the emergency room last evening.  He is in atrial fibrillation and has had short bursts of nonsustained VT noted on monitor.  Presently, he is not having any chest pain and is not dyspneic at rest. HEENT reveals arcus senilis.  JVD is increased to approximately 8 cm.  Breath sounds are relatively clear except decreased at bases.  Rhythm is irregular irregular with ventricular rate in the 54M with 1/6 systolic murmur.  Abdomen is protuberant.  Bowel sounds are present.  There is suggestion of ascites.  There is some scrotal edema but he states his testicles have enlarged.  There is 2-3+ lower extremity pitting edema.  Neurologic is grossly nonfocal.  He has normal affect and mood.  Laboratory revealed initial potassium of 4.9, BUN 51 creatinine 3.76.  Albumin 3.4.  Magnesium 1.7.  Subsequent laboratory showed potassium 5.0, BUN 57, creatinine 3.90.  Hemoglobin/hematocrit 15.6/46.0.  Urine positive for greater than 50 RBCs/hpf.  BNP 3914 troponin 105 > 113.  ECG shows atrial fibrillation at 97, left anterior hemiblock, poor R wave progression.  Chest x-ray demonstrates cardiomegaly, oral effusion.  Abdominal CT and pelvis showed cardiomegaly, anasarca with  moderate layering pleural effusions, small volume ascites and generalized body wall edema.  There are bilateral scrotal hydroceles.  There is a 40 mm abdominal aortic aneurysm bilateral pleural effusion.  Presently the patient does not have any chest pain.  His troponin elevation most likely is due to demand ischemia in the setting of his acute on chronic combined heart failure.  Recommend follow-up echo Doppler study.  He has not responded well to Lasix so far may need 120 mg IV dose.  Would strongly recommend nephrology consultation for further evaluation.  If he is unable to diurese will defer to nephrology if patient may need ultrafiltration or dialysis.  Troy Sine, MD, [facility] 08/24/2021 3:56 PM

## 2021-08-24 NOTE — H&P (Addendum)
History and Physical    Patient: Anthony Foster FBP:[id number] DOB: 29-Oct-1940 DOA: 08/24/2021 DOS: the patient was seen and examined on 08/24/2021 PCP: Wenda Low, MD  Patient coming from: Home via EMS  Chief Complaint:  Chief Complaint  Patient presents with   Swelling    HPI: Anthony Foster is a 81 y.o. 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  Patient reports symptoms initially started in his legs and progressively went up into his scrotum.  Last night around 11 PM [place] he noted that his testicles were significantly swollen.  At home he reports that he has had some shortness of breath, mild nonproductive cough, intermittent wheezing, and abdominal distention.  Of note he reports in November of 2022 he had a home bout of diarrhea for which he had stopped taking all of his medicines.  However he recently restarted in the last 2-3 weeks and has been taking them as prescribed.  He reports that he recently had an echocardiogram.  Unclear if this was possibly done at the [place].  He has a right upper extremity fistula in place for about 3 years now, but has not needed to start on dialysis.  He is followed by [facility].  Upon admission to the emergency department patient was noted to be afebrile with respirations 13-28, and all other vital signs maintained.  Chest x-ray noted shallow inspiration with bibasilar atelectasis for which pneumonia was not excluded, and possible small left pleural effusion.  Urinalysis noted large hemoglobin with greater than 50 RBCs, 100 protein, and rare bacteria.  Renal CT noted cardiomegaly with anasarca with moderate layering pleural effusions small volume ascites and  general body wall edema with large scrotum appears related to bilateral scrotal scrotal hydroceles with no soft tissue gas, and infrarenal abdominal aortic aneurysm measuring 4 cm.  Attempts at in and out cath were performed, but patient reported significant pain and was able to urinate thereafter.  Patient has been given Lasix 80 mg IV x1 dose.  Telemetry couple runs of SVT while in the ED. telemetry no  Review of Systems: As mentioned in the history of present illness. All other systems reviewed and are negative. Past Medical History:  Diagnosis Date   AAA (abdominal aortic aneurysm)    3.5 cm 04/2018   Atrial fibrillation (HCC)    CHF (congestive heart failure) ([place])    Coronary artery disease    Prior stent   Degenerative joint disease    Full dentures    Gout    Hypercholesteremia    Hypertension    ICD (implantable cardioverter-defibrillator) battery depletion    Ischemic cardiomyopathy    Pneumonia    Renal insufficiency    Wears glasses    Past Surgical History:  Procedure Laterality Date   AV FISTULA PLACEMENT Right 11/26/2018   Procedure:     Right arm Brachiocephalic Fistula Creation   ;  Surgeon: Waynetta Sandy, MD;  Location: [facility];  Service: Vascular;  Laterality: Right;   BACK SURGERY     CARDIAC SURGERY     CATARACT EXTRACTION W/ INTRAOCULAR LENS  IMPLANT, BILATERAL     COLONOSCOPY W/ BIOPSIES AND POLYPECTOMY     MASS EXCISION  neck   MULTIPLE TOOTH EXTRACTIONS     PACEMAKER PLACEMENT     SHOULDER SURGERY     left   TOTAL SHOULDER REPLACEMENT     WRIST SURGERY     Social History:  reports that he has been smoking cigars. He has never used smokeless tobacco. He reports that he does not currently use alcohol. He reports that he does not use drugs.  Allergies  Allergen Reactions   Hymenoptera Venom Preparations Hives    Reaction to wasps   Lisinopril Other (See Comments)    angioedema   Piroxicam Other (See Comments)    burn   Shellfish  Allergy Swelling   Amoxicillin-Pot Clavulanate Rash    Did it involve swelling of the face/tongue/throat, SOB, or low BP? No Did it involve sudden or severe rash/hives, skin peeling, or any reaction on the inside of your mouth or nose? Yes Did you need to seek medical attention at a hospital or doctor's office? Yes When did it last happen?      over 10 years If all above answers are NO, may proceed with cephalosporin use.     Family History  Problem Relation Age of Onset   Heart disease Other        No family history   Diabetes Sister     Prior to Admission medications   Medication Sig Start Date End Date Taking? Authorizing Provider  allopurinol (ZYLOPRIM) 300 MG tablet Take 300 mg by mouth daily. 07/26/16  Yes [provider]  apixaban (ELIQUIS) 2.5 MG TABS tablet Take 1 tablet (2.5 mg total) by mouth daily. 06/10/19  Yes Lelon Perla, MD  atorvastatin (LIPITOR) 80 MG tablet Take 1 tablet (80 mg total) by mouth daily. 09/12/15  Yes Lelon Perla, MD  carvedilol (COREG) 25 MG tablet Take 25 mg by mouth daily.  09/21/16  Yes [provider]  ezetimibe (ZETIA) 10 MG tablet Take 1 tablet (10 mg total) by mouth daily. 06/08/21 09/06/21 Yes Lelon Perla, MD  furosemide (LASIX) 40 MG tablet Take 40 mg by mouth daily. 07/21/16  Yes [provider]  hydrALAZINE (APRESOLINE) 25 MG tablet Take 1 tablet (25 mg total) by mouth 3 (three) times daily. 09/12/15  Yes Lelon Perla, MD  isosorbide mononitrate (IMDUR) 60 MG 24 hr tablet Take 1 tablet (60 mg total) by mouth daily. 01/22/18  Yes Kilroy, Luke K, PA-C  nitroGLYCERIN (NITROSTAT) 0.4 MG SL tablet Place 1 tablet (0.4 mg total) under the tongue every 5 (five) minutes as needed for chest pain. 01/14/18 08/24/21 Yes Kilroy, Doreene Burke, PA-C  oxyCODONE-acetaminophen (PERCOCET/ROXICET) 5-325 MG tablet Take 1 tablet by mouth every 6 (six) hours as needed. 11/26/18  Yes Laurence Slate M, PA-C  potassium chloride (KLOR-CON  M) 10 MEQ tablet Take 10 mEq by mouth daily. 08/01/21  Yes [provider]  PROAIR RESPICLICK 277 (90 Base) MCG/ACT AEPB Inhale 1 puff into the lungs every 4 (four) hours as needed (shortness of breath). 08/02/21  Yes [provider]    Physical Exam: Vitals:   08/24/21 0445 08/24/21 0500 08/24/21 0615 08/24/21 0745  BP: 102/78 119/66 126/78 119/66  Pulse: 83 80 90 86  Resp: 14 13 (!) 23 12  Temp:      TempSrc:      SpO2: 98% 98% 100% 96%  Height:       Constitutional: Elderly male currently in no acute distress Eyes: PERRL, lids and conjunctivae normal ENMT: Mucous membranes are  moist. Posterior pharynx clear of any exudate or lesions. Neck: normal, supple, JVD appreciated. Respiratory: Normal respiratory effort with decreased aeration of the lower lung fields. Cardiovascular: Irregular irregular with positive systolic murmur.  At least 2+ pitting bilateral lower extremity edema.  Right upper extremity fistula with palpable thrill appreciated. Abdomen: Protuberant abdomen without tenderness to palpation.. Bowel sounds positive.  Musculoskeletal: no clubbing / cyanosis. No joint deformity upper and lower extremities.  Skin: no rashes, lesions, ulcers. No induration Neurologic: CN 2-12 grossly intact.  Strength 5/5 in all 4.  Psychiatric: Normal judgment and insight. Alert and oriented x 3. Normal mood.    Data Reviewed:  EKG noted atrial fibrillation at 97 bpm with left anterior fifth scapular blood  Assessment and Plan: * Acute on chronic combined systolic and diastolic CHF (congestive heart failure) ([place]) Upon admission patient comes in with complaints of progressively worsening swelling of the lower extremities up into his testicles.  Last reported to have urinated 11 p.m. yesterday evening.  BNP was elevated at 3914.4.  CT scan noted signs of anasarca with bilateral moderate pleural effusions.  He reports that he been taking furosemide 40 mg daily as prescribed  here recently.  Patient has been given Lasix 80 mg IV daily.  Last available echocardiogram noted EF of 35 to 40% with grade 2 diastolic dysfunction back in 2017, but patient reports that he had recently had an echocardiogram done for which records cannot currently be found. -Admit to a telemetry bed  -Heart failure order set utilized -Strict I&Os and daily weights -Continue Lasix 40 mg IV twice daily unless otherwise -Continue Coreg as tolerated -Echocardiogram was initially ordered, but canceled due to patient's reports of having this just recently performed -[place] cardiology consultative services, we will follow-up for any further recommendations -May need to consult nephrology if IV diuresis is seen to be ineffective  PAF (paroxysmal atrial fibrillation) ([place]) on chronic anticoagulation Patient is in atrial fibrillation but currently rate controlled.  Medication regimen includes Eliquis and Coreg. CHA2DS2-VASc score =5 -Continue current medication regimen  CRI (chronic renal insufficiency), stage 4 (severe) ([place]) On admission creatinine 3.76 with BUN 51.  Patient with right upper extremity fistula in place with palpable thrill, but not yet started on dialysis.  Patient followed in the outpatient setting by [facility]. -[facility] consulted to evaluate  Essential hypertension On admission blood pressures noted to be 102/7-136/75.  Home blood pressure regimen includes Coreg 25 mg daily, isosorbide mononitrate 60 mg daily, furosemide 40 mg daily, and hydralazine 25 mg 3 times daily. -Continue Coreg -Resume isosorbide mononitrate and hydralazine when medically appropriate  ICD (implantable cardioverter-defibrillator) in place Patient with prior history of ischemic cardiomyopathy status post AICD.  AAA (abdominal aortic aneurysm) Patient has a known AAA last evaluated by ultrasound 05/2021 noted to be 4 cm at that time. -Continue outpatient follow-up  and surveillance  Thrombocytopenia ([place]) Acute.  Platelet count 130.  No reports of bleeding. -Continue to monitor  Tobacco use Chronic.  Patient reports smoking 5 cigarettes or so per week on average.  Declines need of nicotine patch   Advance Care Planning:   Code Status: Full Code   Consults: Cardiology, nephrology  Family Communication: Patient stated that he already updated his family  Severity of Illness: The appropriate patient status for this patient is INPATIENT. Inpatient status is judged to be reasonable and necessary in order to provide the required intensity of service to ensure the patient's safety. The patient's presenting symptoms,  physical exam findings, and initial radiographic and laboratory data in the context of their chronic comorbidities is felt to place them at high risk for further clinical deterioration. Furthermore, it is not anticipated that the patient will be medically stable for discharge from the hospital within 2 midnights of admission.   * I certify that at the point of admission it is my clinical judgment that the patient will require inpatient hospital care spanning beyond 2 midnights from the point of admission due to high intensity of service, high risk for further deterioration and high frequency of surveillance required.*  Author: Norval Morton, MD 08/24/2021 8:52 AM  For on call review [email or web address].

## 2021-08-24 NOTE — Assessment & Plan Note (Addendum)
-  s/p ICD for ischemic cardiomyopathy ?

## 2021-08-24 NOTE — Assessment & Plan Note (Addendum)
Hypomagnesemia  His edema has been improving.  Urine output over last 24 hrs is 2,200   Renal function with serum cr at 4,61 with K at 4,0 and serum bicarbonate at 33. Patient has declined renal replacement therapy.  Follow up renal function in am.  Avoid hypotension and nephrotoxic medications.

## 2021-08-24 NOTE — Assessment & Plan Note (Addendum)
Cell count has been stable with Plt at 136, hgb is 12,7and hct at 37,7.  ?Close follow up cell count.  ? ?

## 2021-08-24 NOTE — ED Notes (Signed)
Patient transported to CT 

## 2021-08-24 NOTE — Assessment & Plan Note (Addendum)
Heart rate has been controlled, continue anticoagulation with apixaban.  ?Transitory non sustained VT, continue telemetry monitoring.  ? ?Not on av blockade due to risk of hypotension. ? ? ?

## 2021-08-24 NOTE — TOC Progression Note (Signed)
Transition of Care (TOC) - Progression Note  ? ? ?Patient Details  ?Name: Anthony Foster ?MRN: [id number] ?Date of Birth: [birth year] ? ?Transition of Care (TOC) CM/SW Contact  ?Zenon Mayo, RN ?Phone Number: ?08/24/2021, 4:17 PM ? ?Clinical Narrative:    ? ?Transition of Care (TOC) Screening Note ? ? ?Patient Details  ?Name: Anthony Foster ?Date of Birth: 1941-06-22 ? ? ?Transition of Care (TOC) CM/SW Contact:    ?Zenon Mayo, RN ?Phone Number: ?08/24/2021, 4:17 PM ? ? ? ?Transition of Care Department [facility]) has reviewed patient and no TOC needs have been identified at this time. We will continue to monitor patient advancement through interdisciplinary progression rounds. If new patient transition needs arise, please place a TOC consult. ?  ? ? ?  ?  ? ?Expected Discharge Plan and Services ?  ?  ?  ?  ?  ?                ?  ?  ?  ?  ?  ?  ?  ?  ?  ?  ? ? ?Social Determinants of Health (SDOH) Interventions ?  ? ?Readmission Risk Interventions ?No flowsheet data found. ? ?

## 2021-08-24 NOTE — Assessment & Plan Note (Addendum)
Echocardiogram with worsening LV systolic function down to 20 to 25%.   Urine output is 2,200  ml over last 24 hrs Continue to improve edema.  Patient has been on milrinone infusion and diuretic therapy has been transitioned to oral therapy with good toleration.   Not able to use ras inhibition due to risk of hypotension, and not on B blockade due to low output heart failure decompensation.   Out of bed to chair tid with meals and encourage mobility.  To consider wean off inotropic support in preparation for discharge home with hospice services.

## 2021-08-24 NOTE — Plan of Care (Signed)
  Problem: Education: Goal: Knowledge of General Education information will improve Description: Including pain rating scale, medication(s)/side effects and non-pharmacologic comfort measures Outcome: Progressing   Problem: Clinical Measurements: Goal: Ability to maintain clinical measurements within normal limits will improve Outcome: Progressing   

## 2021-08-24 NOTE — Assessment & Plan Note (Addendum)
Blood pressure has been stable with systolic at 105 and 113 mmHg. Now off milrinone  Continue with midodrine.  Follow up blood pressure closely.

## 2021-08-24 NOTE — ED Notes (Signed)
Pt has had several short runs of Vtach on monitor. Not sustained and asymptomatic. Dr Tamala Julian rounding on pt at this time, updated of same. ?

## 2021-08-24 NOTE — ED Triage Notes (Addendum)
Pt bib [facility] from home c/o trouble urinating and swollen testicles. Pt last urinated 11 yesterday morning. Hx of CHF, COPD and Afib, ICD in place. EMS noted bilateral leg swelling. No pain reported, pt slightly SOB. Axo x4.  ?

## 2021-08-25 ENCOUNTER — Inpatient Hospital Stay (HOSPITAL_COMMUNITY): Payer: Medicare Other

## 2021-08-25 DIAGNOSIS — I5043 Acute on chronic combined systolic (congestive) and diastolic (congestive) heart failure: Secondary | ICD-10-CM | POA: Diagnosis not present

## 2021-08-25 LAB — ECHOCARDIOGRAM COMPLETE
AR max vel: 1.29 cm2
AV Area VTI: 1.21 cm2
AV Area mean vel: 1.06 cm2
AV Mean grad: 7 mmHg
AV Peak grad: 13.5 mmHg
Ao pk vel: 1.84 m/s
Area-P 1/2: 3.95 cm2
Height: 68 in
MV VTI: 1.82 cm2
S' Lateral: 4.95 cm
Single Plane A4C EF: 30.3 %
Weight: 2945.6 oz

## 2021-08-25 LAB — BASIC METABOLIC PANEL
Anion gap: 8 (ref 5–15)
BUN: 57 mg/dL — ABNORMAL HIGH (ref 8–23)
CO2: 19 mmol/L — ABNORMAL LOW (ref 22–32)
Calcium: 8.9 mg/dL (ref 8.9–10.3)
Chloride: 110 mmol/L (ref 98–111)
Creatinine, Ser: 4.07 mg/dL — ABNORMAL HIGH (ref 0.61–1.24)
GFR, Estimated: 14 mL/min — ABNORMAL LOW (ref >60)
Glucose, Bld: 137 mg/dL — ABNORMAL HIGH (ref 70–99)
Potassium: 4.6 mmol/L (ref 3.5–5.1)
Sodium: 137 mmol/L (ref 135–145)

## 2021-08-25 MED ORDER — FUROSEMIDE 10 MG/ML IJ SOLN
160.0000 mg | Freq: Three times a day (TID) | INTRAVENOUS | Status: DC
  Administered 2021-08-25 – 2021-09-02 (×24): 160 mg via INTRAVENOUS
  Filled 2021-08-25 (×3): qty 16
  Filled 2021-08-25: qty 10
  Filled 2021-08-25 (×4): qty 16
  Filled 2021-08-25: qty 10
  Filled 2021-08-25 (×2): qty 16
  Filled 2021-08-25: qty 10
  Filled 2021-08-25 (×2): qty 16
  Filled 2021-08-25: qty 10
  Filled 2021-08-25: qty 6
  Filled 2021-08-25 (×7): qty 16
  Filled 2021-08-25 (×2): qty 10
  Filled 2021-08-25 (×2): qty 16

## 2021-08-25 MED ORDER — CARVEDILOL 3.125 MG PO TABS
3.1250 mg | ORAL_TABLET | Freq: Every day | ORAL | Status: DC
  Administered 2021-08-26 – 2021-08-28 (×3): 3.125 mg via ORAL
  Filled 2021-08-25 (×3): qty 1

## 2021-08-25 MED ORDER — PERFLUTREN LIPID MICROSPHERE
1.0000 mL | INTRAVENOUS | Status: AC | PRN
  Administered 2021-08-25: 5 mL via INTRAVENOUS
  Filled 2021-08-25: qty 10

## 2021-08-25 MED ORDER — MIDODRINE HCL 5 MG PO TABS
10.0000 mg | ORAL_TABLET | Freq: Two times a day (BID) | ORAL | Status: DC
  Administered 2021-08-25 – 2021-09-07 (×27): 10 mg via ORAL
  Filled 2021-08-25 (×27): qty 2

## 2021-08-25 MED ORDER — CARVEDILOL 6.25 MG PO TABS
6.2500 mg | ORAL_TABLET | Freq: Every day | ORAL | Status: DC
  Administered 2021-08-25: 6.25 mg via ORAL
  Filled 2021-08-25: qty 1

## 2021-08-25 NOTE — Progress Notes (Signed)
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  He is followed by [facility], takes Lasix 40 Mg daily. -In the ED chest x-ray noted small pleural effusions, UA with hematuria, CT abdomen noted cardiomegaly, anasarca,, moderate layering pleural effusions, small volume ascites and generalized body edema, hydroceles and infrarenal AAA. -Labs noted creatinine of 3.9, bicarb of 18, BNP 3914   Subjective: -Complains of swelling, breathing is not too bad  Assessment and Plan:  Acute on chronic combined systolic and diastolic CHF Progressive CKD 4 -Last echo from 2017, noted EF of 35-40% with grade 2 diastolic dysfunction, repeat echo pending -I/os not accurate, continue Lasix 120 Mg twice daily -Continue carvedilol, dose decreased, blood pressure is soft, added midodrine -Hold Imdur and hydralazine -Nephrology consulted, followed by Dr. Marval Regal CKD has AV fistula for few years -Monitor I's/O, daily weights  CKD 4 -Baseline creatinine around 3.5 -See discussion above  PAF (paroxysmal atrial fibrillation) ([place]) on chronic anticoagulation -Rate controlled, continue Eliquis and Coreg  AAA (abdominal aortic aneurysm) Patient has a known AAA last evaluated by ultrasound 05/2021 noted to be 4 cm at that time.  Thrombocytopenia ([place]) Acute.  Platelet count 130.  No reports of bleeding. -Continue to monitor  Essential hypertension -BP soft, adding midodrine, hold Imdur and hydralazine, Coreg dose decreased  ICD (implantable cardioverter-defibrillator) in place -status post AICD  for ischemic cardiomyopathy  DVT prophylaxis: Eliquis Code Status: Full code Family Communication: Discussed with patient in detail, no family at bedside Disposition Plan: Home pending improvement in volume status  Consultants:  Cards, nephrology  Procedures:   Antimicrobials:    Objective: Vitals:   08/24/21 2026 08/25/21 0306 08/25/21 0745 08/25/21 0925  BP: 106/67 (!) 93/51 98/67 (!) 102/52  Pulse: (!) 33 61 75 74  Resp: (!) 25 20    Temp: (!) 97.3 F (36.3 C) (!) 97.2 F (36.2 C)    TempSrc: Oral Oral    SpO2: 100% 99%    Weight:  83.5 kg    Height:        Intake/Output Summary (Last 24 hours) at 08/25/2021 0954 Last data filed at 08/25/2021 1540 Gross per 24 hour  Intake 180 ml  Output 700 ml  Net -520 ml   Filed Weights   08/24/21 1426 08/25/21 0306  Weight: 83.2 kg 83.5 kg    Examination:  General exam: Pleasant male sitting up in bed, AAOx3, no distress HEENT: Positive JVD CVS: S1-S2, regular rate rhythm Lungs: Poor air movement, decreased breath sounds bases Abdomen: Soft, nontender, obese, bowel sounds present Extremities: 2+ edema, Skin: No rashes Psychiatry: Judgement and insight appear normal. Mood & affect appropriate.     Data Reviewed:   CBC: Recent Labs  Lab 08/24/21 0145 08/24/21 0318  WBC 9.6  --   NEUTROABS 6.8  --   HGB 13.7 15.6  HCT 44.2 46.0  MCV 87.5  --   PLT 130*  --    Basic Metabolic Panel: Recent Labs  Lab 08/24/21 0145 08/24/21 0318 08/24/21 1126 08/25/21 0328  NA 141 143  --  137  K 4.9 5.0  --  4.6  CL 114* 114*  --  110  CO2 18*  --   --  19*  GLUCOSE 104* 98  --  137*  BUN 51* 57*  --  57*  CREATININE 3.76* 3.90*  --  4.07*  CALCIUM 9.5  --   --  8.9  MG  --   --  1.7  --    GFR: Estimated Creatinine Clearance: 14.7 mL/min (A) (by C-G formula based on SCr of 4.07 mg/dL (H)). Liver Function Tests: Recent Labs  Lab 08/24/21 0145  AST 17  ALT 13  ALKPHOS 50  BILITOT 0.9  PROT 6.2*  ALBUMIN  3.4*   No results for input(s): LIPASE, AMYLASE in the last 168 hours. No results for input(s): AMMONIA in the last 168 hours. Coagulation Profile: No results for input(s): INR, PROTIME in the last 168 hours. Cardiac Enzymes: No results for input(s): CKTOTAL, CKMB, CKMBINDEX, TROPONINI in the last 168 hours. BNP (last 3 results) No results for input(s): PROBNP in the last 8760 hours. HbA1C: No results for input(s): HGBA1C in the last 72 hours. CBG: No results for input(s): GLUCAP in the last 168 hours. Lipid Profile: No results for input(s): CHOL, HDL, LDLCALC, TRIG, CHOLHDL, LDLDIRECT in the last 72 hours. Thyroid Function Tests: Recent Labs    08/24/21 0959  TSH 2.135   Anemia Panel: No results for input(s): VITAMINB12, FOLATE, FERRITIN, TIBC, IRON, RETICCTPCT in the last 72 hours. Urine analysis:    Component Value Date/Time   COLORURINE AMBER (A) 08/24/2021 0404   APPEARANCEUR HAZY (A) 08/24/2021 0404   LABSPEC 1.018 08/24/2021 0404   PHURINE 5.0 08/24/2021 0404   GLUCOSEU NEGATIVE 08/24/2021 0404   HGBUR LARGE (A) 08/24/2021 0404   BILIRUBINUR NEGATIVE 08/24/2021 0404   KETONESUR NEGATIVE 08/24/2021 0404   PROTEINUR 100 (A) 08/24/2021 0404   NITRITE NEGATIVE 08/24/2021 0404   LEUKOCYTESUR NEGATIVE 08/24/2021 0404   Sepsis Labs: '@LABRCNTIP'$ (procalcitonin:4,lacticidven:4)  ) Recent Results (from the past 240 hour(s))  Resp Panel by RT-PCR (Flu A&B, Covid) Nasopharyngeal Swab     Status: None   Collection Time: 08/24/21  6:07 AM   Specimen: Nasopharyngeal Swab; Nasopharyngeal(NP) swabs in vial transport medium  Result Value Ref Range Status   SARS Coronavirus 2 by RT PCR NEGATIVE NEGATIVE Final    Comment: (NOTE) SARS-CoV-2 target nucleic acids are NOT DETECTED.  The SARS-CoV-2 RNA is generally detectable in upper respiratory specimens during the acute phase of infection. The lowest concentration of SARS-CoV-2 viral copies this assay can detect is 138  copies/mL. A negative result does not preclude SARS-Cov-2 infection and should not be used as the sole basis for treatment or other patient management decisions. A negative result may occur with  improper specimen collection/handling, submission of specimen other than nasopharyngeal swab, presence of viral mutation(s) within the areas targeted by this assay, and inadequate number of viral copies(<138 copies/mL). A negative result must be combined with clinical observations, patient history, and epidemiological information. The expected result is Negative.  Fact Sheet for Patients:  EntrepreneurPulse.com.au  Fact Sheet for Healthcare Providers:  IncredibleEmployment.be  This test is no t yet approved or cleared by the [place] FDA and  has been authorized for detection and/or diagnosis of SARS-CoV-2 by FDA under an Emergency Use Authorization (EUA). This EUA will remain  in effect (meaning this test can be used) for the duration of the COVID-19 declaration under Section 564(b)(1) of the Act, 21 U.S.C.section 360bbb-3(b)(1), unless the authorization is terminated  or revoked  sooner.       Influenza A by PCR NEGATIVE NEGATIVE Final   Influenza B by PCR NEGATIVE NEGATIVE Final    Comment: (NOTE) The Xpert Xpress SARS-CoV-2/FLU/RSV plus assay is intended as an aid in the diagnosis of influenza from Nasopharyngeal swab specimens and should not be used as a sole basis for treatment. Nasal washings and aspirates are unacceptable for Xpert Xpress SARS-CoV-2/FLU/RSV testing.  Fact Sheet for Patients: EntrepreneurPulse.com.au  Fact Sheet for Healthcare Providers: IncredibleEmployment.be  This test is not yet approved or cleared by the [place] FDA and has been authorized for detection and/or diagnosis of SARS-CoV-2 by FDA under an Emergency Use Authorization (EUA). This EUA will remain in effect (meaning  this test can be used) for the duration of the COVID-19 declaration under Section 564(b)(1) of the Act, 21 U.S.C. section 360bbb-3(b)(1), unless the authorization is terminated or revoked.  Performed at [facility], [place] [address], [place], [place] [address]      Radiology Studies: DG Chest Port 1 View  Result Date: 08/24/2021 CLINICAL DATA:  Edema. EXAM: PORTABLE CHEST 1 VIEW COMPARISON:  Chest radiograph dated 01/04/2018. FINDINGS: Shallow inspiration. Bibasilar atelectasis. Pneumonia is not excluded. Probable trace left pleural effusion. No pneumothorax. Mild cardiomegaly. Left pectoral AICD device. Atherosclerotic calcification of the aorta. No acute osseous pathology. Degenerative changes of spine. IMPRESSION: Shallow inspiration with bibasilar atelectasis. Pneumonia is not excluded. Possible small left pleural effusion. Electronically Signed   By: Anner Crete M.D.   On: 08/24/2021 03:18   CT RENAL STONE STUDY  Result Date: 08/24/2021 CLINICAL DATA:  81 year old male with difficulty urinating, swollen testicles, lower extremity swelling, hematuria. EXAM: CT ABDOMEN AND PELVIS WITHOUT CONTRAST TECHNIQUE: Multidetector CT imaging of the abdomen and pelvis was performed following the standard protocol without IV contrast. RADIATION DOSE REDUCTION: This exam was performed according to the departmental dose-optimization program which includes automated exposure control, adjustment of the mA and/or kV according to patient size and/or use of iterative reconstruction technique. COMPARISON:  Portable chest 0302 hours today. FINDINGS: Lower chest: Layering bilateral pleural effusions with simple fluid density compatible with transudate. Moderate volume fluid on the right, small to moderate on the left. Mild cardiomegaly. Cardiac pacemaker leads. No pericardial effusion. Confluent compressive atelectasis in the lower lobes. Hepatobiliary: Negative noncontrast liver and gallbladder. There is  trace perihepatic free fluid. Pancreas: Partially atrophied. Spleen: Negative, no splenomegaly. Adrenals/Urinary Tract: Normal adrenal glands. There is a degree of bilateral renal cortical atrophy. Large but simple fluid density exophytic right upper pole renal cyst is about 8 cm diameter. No hydronephrosis. No nephrolithiasis. No hydroureter. Decompressed urinary bladder. Pelvic phleboliths but no urinary calculus identified. Stomach/Bowel: No dilated large or small bowel. Distal large bowel diverticulosis with no active inflammation. Right lower quadrant and pelvic inlet free fluid with simple fluid density, partially obscuring the appendix although the base of the appendix is normal on series 6, image 51. No large bowel wall thickening is evident. No free air. Decompressed stomach and duodenum. Vascular/Lymphatic: Aortoiliac calcified atherosclerosis. Infrarenal abdominal aortic aneurysm measures 40 mm diameter, and the aorta remains enlarged in a fusiform fashion to the aortoiliac bifurcation. 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 Vascular patency is not evaluated in the absence of IV contrast. No lymphadenopathy identified. Reproductive: Enlarged scrotum with evidence of bilateral simple fluid density scrotal hydrocele (series 3, image 22). No scrotal gas. No significant skin thickening. Bilateral proximal lower extremity generalized subcutaneous edema. Calcified femoral  artery atherosclerosis. Other: Generalized body wall edema suggesting anasarca. Mild presacral stranding but no pelvic free fluid. Musculoskeletal: Advanced lumbar spine degeneration. Widespread lower thoracic through upper lumbar interbody ankylosis from flowing endplate osteophytes. Bilateral SI joint ankylosis also. But there is inconsistent lumbar ankylosis. No acute osseous abnormality identified. IMPRESSION: 1. Cardiomegaly. Anasarca, with up to moderate layering pleural effusions, small volume ascites,  and generalized body wall edema. 2. Enlarged scrotum appears related to bilateral scrotal hydroceles. No soft tissue gas. 3. No bowel obstruction or convincing bowel inflammation. Partial renal atrophy. No obstructive uropathy or urinary calculus identified. 4. Infrarenal abdominal aortic aneurysm measuring up to 40 mm diameter. Recommend follow-up every 12 months and vascular consultation. Reference: J Am Coll Radiol 9833;82:505-397. Aortic aneurysm NOS (ICD10-I71.9). Aortic Atherosclerosis (ICD10-I70.0). 5. Ankylosed SI joints. Incompletely ankylosed spine. 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 Electronically Signed   By: Genevie Ann M.D.   On: 08/24/2021 06:56     Scheduled Meds:  apixaban  2.5 mg Oral Daily   atorvastatin  80 mg Oral Daily   carvedilol  6.25 mg Oral Daily   ezetimibe  10 mg Oral Daily   isosorbide mononitrate  60 mg Oral Daily   midodrine  10 mg Oral BID WC   potassium chloride  10 mEq Oral Daily   sodium chloride flush  3 mL Intravenous Q12H   Continuous Infusions:  sodium chloride     furosemide 120 mg (08/25/21 0928)     LOS: 1 day    Time spent: 74mn  PDomenic Polite MD [facility]   08/25/2021, 9:54 AM

## 2021-08-25 NOTE — Progress Notes (Signed)
*  PRELIMINARY RESULTS* ?Echocardiogram ?2D Echocardiogram has been performed. ? ?Anthony Foster ?08/25/2021, 2:52 PM ?

## 2021-08-25 NOTE — Consult Note (Signed)
Anthony Foster ?Admit Date: 08/24/2021 ?08/25/2021 ?Rexene Agent ?Requesting Physician:  Fanny Bien MD ? ?Reason for Consult:  Progressive renal failure, hypervolemia, AoC S/D CHF exacerbation ? ?HPI:  ?41M admitted overnight after presenting to the ED with progressive lower extremity edema and swelling in the lower back and abdomen.  His past history includes CKD 4 followed in our office by Dr. Marval Regal with a right upper extremity fistula in place; chronic systolic and diastolic heart failure; paroxysmal atrial fibrillation with history of AICD; history of PVD and AAA. ? ?Patient states that he was sick in November and discontinued all of his diuretics, not resumed since.  He developed 4 days of progressive edema which prompted his presentation.  His baseline creatinine is around 3.1-3.7.  He last saw Dr. Marval Regal in August of last year. ? ?Presenting creatinine is 4.07, K4.6, HCO3 19.  Hemoglobin is 15.6.  He is on room air, blood pressures are soft.  CT of the abdomen in the ER demonstrated anasarca with moderate pleural effusions, ascites, generalized body wall edema, bilateral scrotal hydroceles.  He has been placed on furosemide 120 mg twice daily.  Thus far he is -0.6 L from presentation. ? ? ?Creatinine, Ser (mg/dL)  ?Date Value  ?08/25/2021 4.07 (H)  ?08/24/2021 3.90 (H)  ?08/24/2021 3.76 (H)  ?11/26/2018 3.35 (H)  ?03/08/2018 3.70 (H)  ?03/08/2018 3.43 (H)  ?01/04/2018 3.23 (H)  ?12/28/2015 2.44 (H)  ?07/08/2015 2.70 (H)  ?07/07/2015 2.73 (H)  ?] ?I/Os: ?I/O last 3 completed shifts: ?In: -  ?Out: 565 [Urine:565]  ? ?ROS ?NSAIDS: no exposure identified ?IV Contrast no exposure ?TMP/SMX no exposure ?Hypotension mild hypotension present ?Balance of 12 systems is negative w/ exceptions as above ? ?PMH  ?Past Medical History:  ?Diagnosis Date  ? AAA (abdominal aortic aneurysm)   ? 3.5 cm 04/2018  ? Atrial fibrillation ([place])   ? CHF (congestive heart failure) ([place])   ? Coronary artery disease   ? Prior  stent  ? Degenerative joint disease   ? Full dentures   ? Gout   ? Hypercholesteremia   ? Hypertension   ? ICD (implantable cardioverter-defibrillator) battery depletion   ? Ischemic cardiomyopathy   ? Pneumonia   ? Renal insufficiency   ? Wears glasses   ? ?PSH  ?Past Surgical History:  ?Procedure Laterality Date  ? AV FISTULA PLACEMENT Right 11/26/2018  ? Procedure:     Right arm Brachiocephalic Fistula Creation   ;  Surgeon: Waynetta Sandy, MD;  Location: [facility];  Service: Vascular;  Laterality: Right;  ? BACK SURGERY    ? CARDIAC SURGERY    ? CATARACT EXTRACTION W/ INTRAOCULAR LENS  IMPLANT, BILATERAL    ? COLONOSCOPY W/ BIOPSIES AND POLYPECTOMY    ? MASS EXCISION    ? neck  ? MULTIPLE TOOTH EXTRACTIONS    ? PACEMAKER PLACEMENT    ? SHOULDER SURGERY    ? left  ? TOTAL SHOULDER REPLACEMENT    ? WRIST SURGERY    ? ?FH  ?Family History  ?Problem Relation Age of Onset  ? Heart disease Other   ?     No family history  ? Diabetes Sister   ? ?SH  reports that he has been smoking cigars. He has never used smokeless tobacco. He reports that he does not currently use alcohol. He reports that he does not use drugs. ?Allergies  ?Allergies  ?Allergen Reactions  ? Hymenoptera Venom Preparations Hives  ?  Reaction to  wasps  ? Lisinopril Other (See Comments)  ?  angioedema  ? Piroxicam Other (See Comments)  ?  burn  ? Shellfish Allergy Swelling  ? Amoxicillin-Pot Clavulanate Rash  ?  Did it involve swelling of the face/tongue/throat, SOB, or low BP? No ?Did it involve sudden or severe rash/hives, skin peeling, or any reaction on the inside of your mouth or nose? Yes ?Did you need to seek medical attention at a hospital or doctor's office? Yes ?When did it last happen?      over 10 years ?If all above answers are ?NO?, may proceed with cephalosporin use. ?  ? ?Home medications ?Prior to Admission medications   ?Medication Sig Start Date End Date Taking? Authorizing Provider  ?allopurinol (ZYLOPRIM) 300 MG tablet Take 300  mg by mouth daily. 07/26/16  Yes [provider]  ?apixaban (ELIQUIS) 2.5 MG TABS tablet Take 1 tablet (2.5 mg total) by mouth daily. 06/10/19  Yes Lelon Perla, MD  ?atorvastatin (LIPITOR) 80 MG tablet Take 1 tablet (80 mg total) by mouth daily. 09/12/15  Yes Lelon Perla, MD  ?carvedilol (COREG) 25 MG tablet Take 25 mg by mouth daily.  09/21/16  Yes [provider]  ?ezetimibe (ZETIA) 10 MG tablet Take 1 tablet (10 mg total) by mouth daily. 06/08/21 09/06/21 Yes Lelon Perla, MD  ?furosemide (LASIX) 40 MG tablet Take 40 mg by mouth daily. 07/21/16  Yes [provider]  ?hydrALAZINE (APRESOLINE) 25 MG tablet Take 1 tablet (25 mg total) by mouth 3 (three) times daily. 09/12/15  Yes Lelon Perla, MD  ?isosorbide mononitrate (IMDUR) 60 MG 24 hr tablet Take 1 tablet (60 mg total) by mouth daily. 01/22/18  Yes Kilroy, Doreene Burke, PA-C  ?nitroGLYCERIN (NITROSTAT) 0.4 MG SL tablet Place 1 tablet (0.4 mg total) under the tongue every 5 (five) minutes as needed for chest pain. 01/14/18 08/24/21 Yes Kilroy, Doreene Burke, PA-C  ?oxyCODONE-acetaminophen (PERCOCET/ROXICET) 5-325 MG tablet Take 1 tablet by mouth every 6 (six) hours as needed. 11/26/18  Yes Ulyses Amor, PA-C  ?potassium chloride (KLOR-CON M) 10 MEQ tablet Take 10 mEq by mouth daily. 08/01/21  Yes [provider]  ?PROAIR RESPICLICK 268 (90 Base) MCG/ACT AEPB Inhale 1 puff into the lungs every 4 (four) hours as needed (shortness of breath). 08/02/21  Yes [provider]  ? ? ?Current Medications ?Scheduled Meds: ? apixaban  2.5 mg Oral Daily  ? atorvastatin  80 mg Oral Daily  ? carvedilol  6.25 mg Oral Daily  ? ezetimibe  10 mg Oral Daily  ? midodrine  10 mg Oral BID WC  ? potassium chloride  10 mEq Oral Daily  ? sodium chloride flush  3 mL Intravenous Q12H  ? ?Continuous Infusions: ? sodium chloride    ? furosemide 120 mg (08/25/21 0928)  ? ?PRN Meds:.sodium chloride, acetaminophen, ondansetron (ZOFRAN) IV,  oxyCODONE-acetaminophen, sodium chloride flush ? ?CBC ?Recent Labs  ?Lab 08/24/21 ?0145 08/24/21 ?3419  ?WBC 9.6  --   ?NEUTROABS 6.8  --   ?HGB 13.7 15.6  ?HCT 44.2 46.0  ?MCV 87.5  --   ?PLT 130*  --   ? ?Basic Metabolic Panel ?Recent Labs  ?Lab 08/24/21 ?0145 08/24/21 ?0318 08/25/21 ?0328  ?NA 141 143 137  ?K 4.9 5.0 4.6  ?CL 114* 114* 110  ?CO2 18*  --  19*  ?GLUCOSE 104* 98 137*  ?BUN 51* 57* 57*  ?CREATININE 3.76* 3.90* 4.07*  ?CALCIUM 9.5  --  8.9  ? ? ?  Physical Exam   ?Blood pressure (!) 102/52, pulse 74, temperature (!) 97.2 ?F (36.2 ?C), temperature source Oral, resp. rate 20, height '5\' 8"'$  (1.727 m), weight 83.5 kg, SpO2 99 %. ?GEN: Elderly male, no acute distress ?ENT: NCAT ?EYES: EOMI ?CV: Regular, normal S1 and S2 ?PULM: Diminished in the bases, scattered wheezing throughout ?ABD: No tenderness to palpation, hypoactive bowel sounds, protuberant ?SKIN: No rashes or lesions ?EXT: 3+ edema in the legs extending into the abdominal wall ?VASCULAR: RUE AVF with bruit and thrill ?NEURO: intact globally ? ? ?Assessment ?36M With AoC CHF exacerbation, volume overload, advanced CKD4/5; off diuretics prior to presentation. ? ?AoCKD4/5: May just be progression. No immediate indication to initiate dialysis but is very possible. ?AoC CHF exaceration, S/D, Hypervolemic, resp status stable ?Soft BPs ? ?Plan ?Inc lasix to 160 TID ?Reduce carvedilol dosing to allow BPs to come up some  ?Cont midodrine ?Will follow closely ?Low Na fluid restrict diet ?Daily weights, Daily Renal Panel, Strict I/Os, Avoid nephrotoxins (NSAIDs, judicious IV Contrast)  ? ? ?Rexene Agent  ?[phone] pgr ?08/25/2021, 11:32 AM ? ? ?  ?

## 2021-08-25 NOTE — Progress Notes (Signed)
Progress Note  Patient Name: Anthony Foster Date of Encounter: 08/25/2021  [facility] Cardiologist: Kirk Ruths, MD   Subjective   Professional Bowler AV fistula  Inpatient Medications    Scheduled Meds:  apixaban  2.5 mg Oral Daily   atorvastatin  80 mg Oral Daily   carvedilol  6.25 mg Oral Daily   ezetimibe  10 mg Oral Daily   midodrine  10 mg Oral BID WC   potassium chloride  10 mEq Oral Daily   sodium chloride flush  3 mL Intravenous Q12H   Continuous Infusions:  sodium chloride     furosemide 120 mg (08/25/21 0928)   PRN Meds: sodium chloride, acetaminophen, ondansetron (ZOFRAN) IV, oxyCODONE-acetaminophen, sodium chloride flush   Vital Signs    Vitals:   08/24/21 2026 08/25/21 0306 08/25/21 0745 08/25/21 0925  BP: 106/67 (!) 93/51 98/67 (!) 102/52  Pulse: (!) 33 61 75 74  Resp: (!) 25 20    Temp: (!) 97.3 F (36.3 C) (!) 97.2 F (36.2 C)    TempSrc: Oral Oral    SpO2: 100% 99%    Weight:  83.5 kg    Height:        Intake/Output Summary (Last 24 hours) at 08/25/2021 1131 Last data filed at 08/25/2021 3007 Gross per 24 hour  Intake 180 ml  Output 700 ml  Net -520 ml   Last 3 Weights 08/25/2021 08/24/2021 06/07/2021  Weight (lbs) 184 lb 1.6 oz 183 lb 6.8 oz 166 lb 3.2 oz  Weight (kg) 83.507 kg 83.2 kg 75.388 kg      Telemetry    Likely sinus rhythm ventricular pacing- Personally Reviewed   Physical Exam   GEN: No acute distress.   Neck: No JVD Cardiac: RRR, no murmurs, rubs, or gallops.  Respiratory: Clear to auscultation bilaterally. GI: Soft, nontender, non-distended  MS: 3+ lower extremity edema; No deformity. Neuro:  Nonfocal  Psych: Normal affect   Labs    High Sensitivity Troponin:   Recent Labs  Lab 08/24/21 1126 08/24/21 1455  TROPONINIHS 113* 105*     Chemistry Recent Labs  Lab 08/24/21 0145 08/24/21 0318 08/24/21 1126 08/25/21 0328  NA 141 143  --  137  K 4.9 5.0  --  4.6  CL 114* 114*  --  110  CO2 18*   --   --  19*  GLUCOSE 104* 98  --  137*  BUN 51* 57*  --  57*  CREATININE 3.76* 3.90*  --  4.07*  CALCIUM 9.5  --   --  8.9  MG  --   --  1.7  --   PROT 6.2*  --   --   --   ALBUMIN 3.4*  --   --   --   AST 17  --   --   --   ALT 13  --   --   --   ALKPHOS 50  --   --   --   BILITOT 0.9  --   --   --   GFRNONAA 15*  --   --  14*  ANIONGAP 9  --   --  8    Lipids No results for input(s): CHOL, TRIG, HDL, LABVLDL, LDLCALC, CHOLHDL in the last 168 hours.  Hematology Recent Labs  Lab 08/24/21 0145 08/24/21 0318  WBC 9.6  --   RBC 5.05  --   HGB 13.7 15.6  HCT 44.2 46.0  MCV 87.5  --  MCH 27.1  --   MCHC 31.0  --   RDW 16.1*  --   PLT 130*  --    Thyroid  Recent Labs  Lab 08/24/21 0959  TSH 2.135    BNP Recent Labs  Lab 08/24/21 0145  BNP 3,914.4*    DDimer No results for input(s): DDIMER in the last 168 hours.   Radiology    DG Chest Port 1 View  Result Date: 08/24/2021 CLINICAL DATA:  Edema. EXAM: PORTABLE CHEST 1 VIEW COMPARISON:  Chest radiograph dated 01/04/2018. FINDINGS: Shallow inspiration. Bibasilar atelectasis. Pneumonia is not excluded. Probable trace left pleural effusion. No pneumothorax. Mild cardiomegaly. Left pectoral AICD device. Atherosclerotic calcification of the aorta. No acute osseous pathology. Degenerative changes of spine. IMPRESSION: Shallow inspiration with bibasilar atelectasis. Pneumonia is not excluded. Possible small left pleural effusion. Electronically Signed   By: Anner Crete M.D.   On: 08/24/2021 03:18   CT RENAL STONE STUDY  Result Date: 08/24/2021 CLINICAL DATA:  81 year old male with difficulty urinating, swollen testicles, lower extremity swelling, hematuria. EXAM: CT ABDOMEN AND PELVIS WITHOUT CONTRAST TECHNIQUE: Multidetector CT imaging of the abdomen and pelvis was performed following the standard protocol without IV contrast. RADIATION DOSE REDUCTION: This exam was performed according to the departmental dose-optimization  program which includes automated exposure control, adjustment of the mA and/or kV according to patient size and/or use of iterative reconstruction technique. COMPARISON:  Portable chest 0302 hours today. FINDINGS: Lower chest: Layering bilateral pleural effusions with simple fluid density compatible with transudate. Moderate volume fluid on the right, small to moderate on the left. Mild cardiomegaly. Cardiac pacemaker leads. No pericardial effusion. Confluent compressive atelectasis in the lower lobes. Hepatobiliary: Negative noncontrast liver and gallbladder. There is trace perihepatic free fluid. Pancreas: Partially atrophied. Spleen: Negative, no splenomegaly. Adrenals/Urinary Tract: Normal adrenal glands. There is a degree of bilateral renal cortical atrophy. Large but simple fluid density exophytic right upper pole renal cyst is about 8 cm diameter. No hydronephrosis. No nephrolithiasis. No hydroureter. Decompressed urinary bladder. Pelvic phleboliths but no urinary calculus identified. Stomach/Bowel: No dilated large or small bowel. Distal large bowel diverticulosis with no active inflammation. Right lower quadrant and pelvic inlet free fluid with simple fluid density, partially obscuring the appendix although the base of the appendix is normal on series 6, image 51. No large bowel wall thickening is evident. No free air. Decompressed stomach and duodenum. Vascular/Lymphatic: Aortoiliac calcified atherosclerosis. Infrarenal abdominal aortic aneurysm measures 40 mm diameter, and the aorta remains enlarged in a fusiform fashion to the aortoiliac bifurcation. 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 Vascular patency is not evaluated in the absence of IV contrast. No lymphadenopathy identified. Reproductive: Enlarged scrotum with evidence of bilateral simple fluid density scrotal hydrocele (series 3, image 22). No scrotal gas. No significant skin thickening. Bilateral proximal lower  extremity generalized subcutaneous edema. Calcified femoral artery atherosclerosis. Other: Generalized body wall edema suggesting anasarca. Mild presacral stranding but no pelvic free fluid. Musculoskeletal: Advanced lumbar spine degeneration. Widespread lower thoracic through upper lumbar interbody ankylosis from flowing endplate osteophytes. Bilateral SI joint ankylosis also. But there is inconsistent lumbar ankylosis. No acute osseous abnormality identified. IMPRESSION: 1. Cardiomegaly. Anasarca, with up to moderate layering pleural effusions, small volume ascites, and generalized body wall edema. 2. Enlarged scrotum appears related to bilateral scrotal hydroceles. No soft tissue gas. 3. No bowel obstruction or convincing bowel inflammation. Partial renal atrophy. No obstructive uropathy or urinary calculus identified. 4. Infrarenal abdominal  aortic aneurysm measuring up to 40 mm diameter. Recommend follow-up every 12 months and vascular consultation. Reference: J Am Coll Radiol 8115;72:620-355. Aortic aneurysm NOS (ICD10-I71.9). Aortic Atherosclerosis (ICD10-I70.0). 5. Ankylosed SI joints. Incompletely ankylosed spine. 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 Electronically Signed   By: Genevie Ann M.D.   On: 08/24/2021 06:56      Patient Profile     81 y.o. 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  Patient stopped taking all of his medications November because of diarrhea.  Developed edema. -Lasix 120 mg IV twice daily -Midodrine for blood pressure support -Creatinine has increased mildly to 4.0.  To be expected in the setting of ongoing diuresis.  Atrial fibrillation Chronic anticoagulation ICD  -Low atrial fibrillation burden 1% on last interrogation.   Currently telemetry appears to be atrial fibrillation with rate control. -On carvedilol as well as Eliquis.  Abdominal aortic aneurysm - 40 mm control blood pressure.  Smoker - 5 cigarettes/week when not at home.  High risk for readmission, high risk medical decision making  For questions or updates, please contact [place] Please consult [email or web address] for contact info under        Signed, Candee Furbish, MD  08/25/2021, 11:31 AM

## 2021-08-26 DIAGNOSIS — I5043 Acute on chronic combined systolic (congestive) and diastolic (congestive) heart failure: Secondary | ICD-10-CM | POA: Diagnosis not present

## 2021-08-26 LAB — BASIC METABOLIC PANEL
Anion gap: 9 (ref 5–15)
BUN: 61 mg/dL — ABNORMAL HIGH (ref 8–23)
CO2: 20 mmol/L — ABNORMAL LOW (ref 22–32)
Calcium: 9 mg/dL (ref 8.9–10.3)
Chloride: 110 mmol/L (ref 98–111)
Creatinine, Ser: 4.45 mg/dL — ABNORMAL HIGH (ref 0.61–1.24)
GFR, Estimated: 13 mL/min — ABNORMAL LOW (ref >60)
Glucose, Bld: 93 mg/dL (ref 70–99)
Potassium: 4.6 mmol/L (ref 3.5–5.1)
Sodium: 139 mmol/L (ref 135–145)

## 2021-08-26 NOTE — Progress Notes (Signed)
Patient have 5 beats of V.TACH asymptomatic, VSS. Cardiology PA notified. No new order given will continue to monitor. ?

## 2021-08-26 NOTE — Progress Notes (Signed)
Admit: 08/24/2021 ?LOS: 2 ? ?83M With AoC CHF exacerbation, volume overload, advanced CKD4/5; off diuretics prior to presentation. ? ?Subjective:  ?At least 1.2L UOP yesterday ?Weights nto much changed ?Remains edematous, no SOB ?SCR up to 4.5, BUN 61, K 4.6 ?No uremia ?TTE yest LVEF 20-25% ? ?03/04 0701 - 03/05 0700 ?In: 476.8 [P.O.:280; I.V.:3; IV Piggyback:193.8] ?Out: 1175 [Urine:1175] ? ?Filed Weights  ? 08/24/21 1426 08/25/21 0306 08/26/21 0427  ?Weight: 83.2 kg 83.5 kg 83.1 kg  ? ? ?Scheduled Meds: ? apixaban  2.5 mg Oral Daily  ? atorvastatin  80 mg Oral Daily  ? carvedilol  3.125 mg Oral Daily  ? ezetimibe  10 mg Oral Daily  ? midodrine  10 mg Oral BID WC  ? potassium chloride  10 mEq Oral Daily  ? sodium chloride flush  3 mL Intravenous Q12H  ? ?Continuous Infusions: ? sodium chloride    ? furosemide 160 mg (08/26/21 0630)  ? ?PRN Meds:.sodium chloride, acetaminophen, ondansetron (ZOFRAN) IV, oxyCODONE-acetaminophen, sodium chloride flush ? ?Current Labs: reviewed  ? ? ?Physical Exam:  Blood pressure 107/74, pulse 77, temperature 97.9 ?F (36.6 ?C), temperature source Oral, resp. rate 18, height '5\' 8"'$  (1.727 m), weight 83.1 kg, SpO2 100 %. ?GEN: Elderly male, no acute distress ?ENT: NCAT ?EYES: EOMI ?CV: Regular, normal S1 and S2 ?PULM: Diminished in the bases, scattered wheezing throughout ?ABD: No tenderness to palpation, hypoactive bowel sounds, protuberant ?SKIN: No rashes or lesions ?EXT: 3+ edema in the legs extending into the abdominal wall ?VASCULAR: RUE AVF with bruit and thrill ?NEURO: intact globally ? ?A ?AoCKD4/5: May just be progression. No immediate indication to initiate dialysis but is very possible. ?AoC CHF exaceration, S/D, Hypervolemic, resp status stable ?Soft BPs ?Metabolic acidosis, trend for now ? ?P ?Cont diuretics at current dosing for another 24h ?No current HD indication ?Cont BB and midodrine current doses ?Daily weights, Daily Renal Panel, Strict I/Os, Avoid nephrotoxins  (NSAIDs, judicious IV Contrast)  ? ?Pearson Grippe MD ?08/26/2021, 9:38 AM ? ?Recent Labs  ?Lab 08/24/21 ?0145 08/24/21 ?0318 08/25/21 ?0328 08/26/21 ?0225  ?NA 141 143 137 139  ?K 4.9 5.0 4.6 4.6  ?CL 114* 114* 110 110  ?CO2 18*  --  19* 20*  ?GLUCOSE 104* 98 137* 93  ?BUN 51* 57* 57* 61*  ?CREATININE 3.76* 3.90* 4.07* 4.45*  ?CALCIUM 9.5  --  8.9 9.0  ? ?Recent Labs  ?Lab 08/24/21 ?0145 08/24/21 ?6962  ?WBC 9.6  --   ?NEUTROABS 6.8  --   ?HGB 13.7 15.6  ?HCT 44.2 46.0  ?MCV 87.5  --   ?PLT 130*  --   ? ? ? ? ? ? ? ? ? ?  ?

## 2021-08-26 NOTE — Progress Notes (Signed)
Progress Note  Patient Name: Anthony Foster Date of Encounter: 08/26/2021  [facility] Cardiologist: Kirk Ruths, MD   Subjective   Professional Bowler AV fistula Overall fairly comfortable currently.  Still with significant edema.  Inpatient Medications    Scheduled Meds:  apixaban  2.5 mg Oral Daily   atorvastatin  80 mg Oral Daily   carvedilol  3.125 mg Oral Daily   ezetimibe  10 mg Oral Daily   midodrine  10 mg Oral BID WC   potassium chloride  10 mEq Oral Daily   sodium chloride flush  3 mL Intravenous Q12H   Continuous Infusions:  sodium chloride     furosemide 160 mg (08/26/21 0630)   PRN Meds: sodium chloride, acetaminophen, ondansetron (ZOFRAN) IV, oxyCODONE-acetaminophen, sodium chloride flush   Vital Signs    Vitals:   08/25/21 1936 08/26/21 0012 08/26/21 0427 08/26/21 0903  BP: (!) 102/53 (!) 100/53 106/75 107/74  Pulse: 75 73 72 77  Resp: '16 16 16 18  '$ Temp: (!) 97.4 F (36.3 C) (!) 97.5 F (36.4 C) (!) 97.3 F (36.3 C) 97.9 F (36.6 C)  TempSrc: Oral Oral Oral Oral  SpO2: 99% 100% 100% 100%  Weight:   83.1 kg   Height:        Intake/Output Summary (Last 24 hours) at 08/26/2021 1003 Last data filed at 08/26/2021 0844 Gross per 24 hour  Intake 416.78 ml  Output 1000 ml  Net -583.22 ml   Last 3 Weights 08/26/2021 08/25/2021 08/24/2021  Weight (lbs) 183 lb 3.2 oz 184 lb 1.6 oz 183 lb 6.8 oz  Weight (kg) 83.099 kg 83.507 kg 83.2 kg      Telemetry    Sinus rhythm ventricular pacing- Personally Reviewed   Physical Exam   GEN: No acute distress.   Neck: No JVD Cardiac: RRR, no murmurs, rubs, or gallops.  Respiratory: Clear to auscultation bilaterally. GI: Soft, nontender, non-distended  MS: 3+ lower extremity edema; No deformity. Neuro:  Nonfocal  Psych: Normal affect   Labs    High Sensitivity Troponin:   Recent Labs  Lab 08/24/21 1126 08/24/21 1455  TROPONINIHS 113* 105*     Chemistry Recent Labs  Lab 08/24/21 0145  08/24/21 0318 08/24/21 1126 08/25/21 0328 08/26/21 0225  NA 141 143  --  137 139  K 4.9 5.0  --  4.6 4.6  CL 114* 114*  --  110 110  CO2 18*  --   --  19* 20*  GLUCOSE 104* 98  --  137* 93  BUN 51* 57*  --  57* 61*  CREATININE 3.76* 3.90*  --  4.07* 4.45*  CALCIUM 9.5  --   --  8.9 9.0  MG  --   --  1.7  --   --   PROT 6.2*  --   --   --   --   ALBUMIN 3.4*  --   --   --   --   AST 17  --   --   --   --   ALT 13  --   --   --   --   ALKPHOS 50  --   --   --   --   BILITOT 0.9  --   --   --   --   GFRNONAA 15*  --   --  14* 13*  ANIONGAP 9  --   --  8 9    Lipids No results for input(s):  CHOL, TRIG, HDL, LABVLDL, LDLCALC, CHOLHDL in the last 168 hours.  Hematology Recent Labs  Lab 08/24/21 0145 08/24/21 0318  WBC 9.6  --   RBC 5.05  --   HGB 13.7 15.6  HCT 44.2 46.0  MCV 87.5  --   MCH 27.1  --   MCHC 31.0  --   RDW 16.1*  --   PLT 130*  --    Thyroid  Recent Labs  Lab 08/24/21 0959  TSH 2.135    BNP Recent Labs  Lab 08/24/21 0145  BNP 3,914.4*    DDimer No results for input(s): DDIMER in the last 168 hours.   Radiology    ECHOCARDIOGRAM COMPLETE  Result Date: 08/25/2021    ECHOCARDIOGRAM REPORT   Patient Name:   Anthony Foster [facility] Date of Exam: 08/25/2021 Medical Rec #:  [id number]             Height:       68.0 in Accession #:    [id number]            Weight:       184.1 lb Date of Birth:  22-Nov-1940             BSA:          1.973 m Patient Age:    81 years              BP:           81/44 mmHg Patient Gender: M                     HR:           65 bpm. Exam Location:  Inpatient Procedure: 2D Echo, Cardiac Doppler, Color Doppler and Intracardiac            Opacification Agent Indications:    CHF  History:        Patient has prior history of Echocardiogram examinations, most                 recent 09/27/2015. CHF, CAD, Defibrillator, Arrythmias:Atrial                 Fibrillation, Signs/Symptoms:Chest Pain; Risk                 Factors:Hypertension, Dyslipidemia  and Former Smoker.  Sonographer:    Wenda Low Referring Phys: [id number] Martin  1. Left ventricular ejection fraction, by estimation, is 20 to 25%. The left ventricle has severely decreased function. The left ventricle demonstrates global hypokinesis. The left ventricular internal cavity size was moderately to severely dilated. Left ventricular diastolic function could not be evaluated.  2. Right ventricular systolic function is low normal. The right ventricular size is mildly enlarged. There is moderately elevated pulmonary artery systolic pressure.  3. Left atrial size was moderately dilated.  4. Right atrial size was severely dilated.  5. The mitral valve is grossly normal. Mild mitral valve regurgitation.  6. Tricuspid valve regurgitation is moderate.  7. The aortic valve is tricuspid. Aortic valve regurgitation is not visualized. FINDINGS  Left Ventricle: Left ventricular ejection fraction, by estimation, is 20 to 25%. The left ventricle has severely decreased function. The left ventricle demonstrates global hypokinesis. Definity contrast agent was given IV to delineate the left ventricular endocardial borders. The left ventricular internal cavity size was moderately to severely dilated. There is borderline concentric left ventricular hypertrophy. Left ventricular diastolic function could not be evaluated due  to atrial fibrillation. Left ventricular diastolic function could not be evaluated. Right Ventricle: The right ventricular size is mildly enlarged. Right vetricular wall thickness was not well visualized. Right ventricular systolic function is low normal. There is moderately elevated pulmonary artery systolic pressure. The tricuspid regurgitant velocity is 2.98 m/s, and with an assumed right atrial pressure of 15 mmHg, the estimated right ventricular systolic pressure is 85.0 mmHg. Left Atrium: Left atrial size was moderately dilated. Right Atrium: Right atrial size was  severely dilated. Pericardium: There is no evidence of pericardial effusion. Mitral Valve: The mitral valve is grossly normal. Mild mitral valve regurgitation. MV peak gradient, 3.4 mmHg. The mean mitral valve gradient is 2.0 mmHg. Tricuspid Valve: The tricuspid valve is normal in structure. Tricuspid valve regurgitation is moderate. Aortic Valve: The aortic valve is tricuspid. Aortic valve regurgitation is not visualized. Aortic valve mean gradient measures 7.0 mmHg. Aortic valve peak gradient measures 13.5 mmHg. Aortic valve area, by VTI measures 1.21 cm. Pulmonic Valve: The pulmonic valve was normal in structure. Pulmonic valve regurgitation is mild. Aorta: The aortic root and ascending aorta are structurally normal, with no evidence of dilitation. IAS/Shunts: The atrial septum is grossly normal. Additional Comments: A device lead is visualized.  LEFT VENTRICLE PLAX 2D LVIDd:         5.30 cm LVIDs:         4.95 cm LV PW:         1.30 cm LV IVS:        1.20 cm LVOT diam:     2.00 cm LV SV:         48 LV SV Index:   25 LVOT Area:     3.14 cm  LV Volumes (MOD) LV vol d, MOD A2C: 80.7 ml LV vol d, MOD A4C: 111.0 ml LV vol s, MOD A4C: 77.4 ml LV SV MOD A4C:     111.0 ml RIGHT VENTRICLE RV Basal diam:  4.50 cm RV Mid diam:    4.70 cm RV S prime:     7.62 cm/s TAPSE (M-mode): 1.7 cm LEFT ATRIUM           Index        RIGHT ATRIUM           Index LA diam:      4.00 cm 2.03 cm/m   RA Area:     29.00 cm LA Vol (A4C): 81.9 ml 41.51 ml/m  RA Volume:   115.00 ml 58.29 ml/m  AORTIC VALVE                     PULMONIC VALVE AV Area (Vmax):    1.29 cm      PV Vmax:       0.89 m/s AV Area (Vmean):   1.06 cm      PV Peak grad:  3.2 mmHg AV Area (VTI):     1.21 cm AV Vmax:           184.00 cm/s AV Vmean:          124.000 cm/s AV VTI:            0.401 m AV Peak Grad:      13.5 mmHg AV Mean Grad:      7.0 mmHg LVOT Vmax:         75.80 cm/s LVOT Vmean:        41.900 cm/s LVOT VTI:          0.154  m LVOT/AV VTI ratio: 0.38   AORTA Ao Root diam: 2.80 cm Ao Asc diam:  2.70 cm MITRAL VALVE               TRICUSPID VALVE MV Area (PHT): 3.95 cm    TR Peak grad:   35.5 mmHg MV Area VTI:   1.82 cm    TR Vmax:        298.00 cm/s MV Peak grad:  3.4 mmHg MV Mean grad:  2.0 mmHg    SHUNTS MV Vmax:       0.92 m/s    Systemic VTI:  0.15 m MV Vmean:      56.0 cm/s   Systemic Diam: 2.00 cm MV Decel Time: 192 msec MV E velocity: 73.30 cm/s MV A velocity: 42.70 cm/s MV E/A ratio:  1.72 Mertie Moores MD Electronically signed by Mertie Moores MD Signature Date/Time: 08/25/2021/3:48:21 PM    Final       Patient Profile     81 y.o. 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  Patient stopped taking all of his medications November because of diarrhea.  Developed edema. -Lasix 120 mg IV twice daily.  Appreciate nephrology's assistance.  We will continue for the next 24 hours.  No current HD indication. -Midodrine for blood pressure support -Creatinine has increased mildly to 4.0.  To be expected in the setting of ongoing diuresis.  Atrial fibrillation Chronic anticoagulation ICD  -Low atrial fibrillation burden 1% on last interrogation.  Currently telemetry appears to be sinus rhythm with ventricular pacing -On carvedilol as well as Eliquis.  Abdominal aortic aneurysm - 40 mm control blood pressure.  Hopefully will never be of clinical concern  Smoker - 5 cigarettes/week when not at home.  High risk for readmission, high risk medical decision making  For questions or updates, please contact [place] Please consult [email or web address] for contact info under        Signed, Candee Furbish, MD  08/26/2021, 10:03 AM

## 2021-08-26 NOTE — Progress Notes (Signed)
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  -In the ED chest x-ray noted small pleural effusions, UA with hematuria, CT abdomen noted cardiomegaly, anasarca,, moderate layering pleural effusions, small volume ascites and generalized body edema, hydroceles and infrarenal AAA. -Labs noted creatinine of 3.9, bicarb of 18, BNP 3914 -Cardiology, nephrology following, started on high-dose diuretics    Subjective: -Feels about the same, no events overnight, still has significant swelling and some dyspnea  Assessment and Plan:  * Acute on chronic combined systolic and diastolic CHF (congestive heart failure) (HCC) Progressive CKD 4 -Last echo from 2017, noted EF of 35-40% with grade 2 diastolic dysfunction, repeat echo with EF down to 20-25% -Appreciate nephrology input, started on Lasix 160 mg 3 times daily,, continue current meds, poor response thus far -Carvedilol dose decreased, continue midodrine -No indications for dialysis yet  CRI (chronic renal insufficiency), stage 4 (severe) (HCC) -Baseline creatinine around 3.5, followed by [facility], has a AV fistula for a few years  -See discussion above  PAF (paroxysmal atrial fibrillation) ([place]) on chronic anticoagulation - Rate controlled continue Eliquis and Coreg  AAA (abdominal aortic aneurysm) Patient has a known AAA last evaluated by ultrasound 05/2021 noted to be 4 cm at that time.  Thrombocytopenia (HCC) -Mild, monitor   Essential hypertension - Soft but stable, continue midodrine, Imdur and hydralazine on  hold -Coreg dose decreased  ICD (implantable cardioverter-defibrillator) in place -s/p ICD for ischemic cardiomyopathy   DVT prophylaxis: Eliquis Code Status: Full code Family Communication: Discussed with patient in detail, no family at bedside Disposition Plan: Home pending improvement in volume status   Consultants:  Cards, nephrology    Procedures:   Antimicrobials:    Objective: Vitals:   08/25/21 1936 08/26/21 0012 08/26/21 0427 08/26/21 0903  BP: (!) 102/53 (!) 100/53 106/75 107/74  Pulse: 75 73 72 77  Resp: '16 16 16 18  '$ Temp: (!) 97.4 F (36.3 C) (!) 97.5 F (36.4 C) (!) 97.3 F (36.3 C) 97.9 F (36.6 C)  TempSrc: Oral Oral Oral Oral  SpO2: 99% 100% 100% 100%  Weight:   83.1 kg   Height:        Intake/Output Summary (Last 24 hours) at 08/26/2021 1113 Last data filed at 08/26/2021 0844 Gross per 24 hour  Intake 416.78 ml  Output 1000 ml  Net -583.22 ml   Filed Weights   08/24/21 1426 08/25/21 0306 08/26/21 0427  Weight: 83.2 kg 83.5 kg 83.1 kg    Examination:  General exam: Appears calm and comfortable, AAOx3, no distress HEENT: Positive JVD CVS: S1-S2, regular rhythm Lungs: Decreased breath sounds the bases Abdomen: Soft, obese, nontender, abdominal wall edema Extremities: 2+ edema Skin: No rashes Psychiatry:  Mood & affect appropriate.     Data Reviewed:   CBC: Recent Labs  Lab 08/24/21 0145 08/24/21 0318  WBC 9.6  --   NEUTROABS 6.8  --   HGB 13.7 15.6  HCT 44.2 46.0  MCV 87.5  --   PLT 130*  --    Basic Metabolic Panel: Recent Labs  Lab 08/24/21 0145 08/24/21 0318 08/24/21 1126 08/25/21  9924 08/26/21 0225  NA 141 143  --  137 139  K 4.9 5.0  --  4.6 4.6  CL 114* 114*  --  110 110  CO2 18*  --   --  19* 20*  GLUCOSE 104* 98  --  137* 93  BUN 51* 57*  --  57* 61*  CREATININE 3.76* 3.90*  --  4.07* 4.45*  CALCIUM 9.5  --   --  8.9 9.0  MG  --   --  1.7  --   --    GFR: Estimated Creatinine Clearance: 13.5 mL/min (A)  (by C-G formula based on SCr of 4.45 mg/dL (H)). Liver Function Tests: Recent Labs  Lab 08/24/21 0145  AST 17  ALT 13  ALKPHOS 50  BILITOT 0.9  PROT 6.2*  ALBUMIN 3.4*   No results for input(s): LIPASE, AMYLASE in the last 168 hours. No results for input(s): AMMONIA in the last 168 hours. Coagulation Profile: No results for input(s): INR, PROTIME in the last 168 hours. Cardiac Enzymes: No results for input(s): CKTOTAL, CKMB, CKMBINDEX, TROPONINI in the last 168 hours. BNP (last 3 results) No results for input(s): PROBNP in the last 8760 hours. HbA1C: No results for input(s): HGBA1C in the last 72 hours. CBG: No results for input(s): GLUCAP in the last 168 hours. Lipid Profile: No results for input(s): CHOL, HDL, LDLCALC, TRIG, CHOLHDL, LDLDIRECT in the last 72 hours. Thyroid Function Tests: Recent Labs    08/24/21 0959  TSH 2.135   Anemia Panel: No results for input(s): VITAMINB12, FOLATE, FERRITIN, TIBC, IRON, RETICCTPCT in the last 72 hours. Urine analysis:    Component Value Date/Time   COLORURINE AMBER (A) 08/24/2021 0404   APPEARANCEUR HAZY (A) 08/24/2021 0404   LABSPEC 1.018 08/24/2021 0404   PHURINE 5.0 08/24/2021 0404   GLUCOSEU NEGATIVE 08/24/2021 0404   HGBUR LARGE (A) 08/24/2021 0404   BILIRUBINUR NEGATIVE 08/24/2021 0404   KETONESUR NEGATIVE 08/24/2021 0404   PROTEINUR 100 (A) 08/24/2021 0404   NITRITE NEGATIVE 08/24/2021 0404   LEUKOCYTESUR NEGATIVE 08/24/2021 0404   Sepsis Labs: '@LABRCNTIP'$ (procalcitonin:4,lacticidven:4)  ) Recent Results (from the past 240 hour(s))  Resp Panel by RT-PCR (Flu A&B, Covid) Nasopharyngeal Swab     Status: None   Collection Time: 08/24/21  6:07 AM   Specimen: Nasopharyngeal Swab; Nasopharyngeal(NP) swabs in vial transport medium  Result Value Ref Range Status   SARS Coronavirus 2 by RT PCR NEGATIVE NEGATIVE Final    Comment: (NOTE) SARS-CoV-2 target nucleic acids are NOT DETECTED.  The SARS-CoV-2 RNA is generally  detectable in upper respiratory specimens during the acute phase of infection. The lowest concentration of SARS-CoV-2 viral copies this assay can detect is 138 copies/mL. A negative result does not preclude SARS-Cov-2 infection and should not be used as the sole basis for treatment or other patient management decisions. A negative result may occur with  improper specimen collection/handling, submission of specimen other than nasopharyngeal swab, presence of viral mutation(s) within the areas targeted by this assay, and inadequate number of viral copies(<138 copies/mL). A negative result must be combined with clinical observations, patient history, and epidemiological information. The expected result is Negative.  Fact Sheet for Patients:  EntrepreneurPulse.com.au  Fact Sheet for Healthcare Providers:  IncredibleEmployment.be  This test is no t yet approved or cleared by the [place] FDA and  has been authorized for detection and/or diagnosis of SARS-CoV-2 by FDA under an Emergency Use Authorization (EUA). This EUA will remain  in effect (meaning  this test can be used) for the duration of the COVID-19 declaration under Section 564(b)(1) of the Act, 21 U.S.C.section 360bbb-3(b)(1), unless the authorization is terminated  or revoked sooner.       Influenza A by PCR NEGATIVE NEGATIVE Final   Influenza B by PCR NEGATIVE NEGATIVE Final    Comment: (NOTE) The Xpert Xpress SARS-CoV-2/FLU/RSV plus assay is intended as an aid in the diagnosis of influenza from Nasopharyngeal swab specimens and should not be used as a sole basis for treatment. Nasal washings and aspirates are unacceptable for Xpert Xpress SARS-CoV-2/FLU/RSV testing.  Fact Sheet for Patients: EntrepreneurPulse.com.au  Fact Sheet for Healthcare Providers: IncredibleEmployment.be  This test is not yet approved or cleared by the [place] FDA  and has been authorized for detection and/or diagnosis of SARS-CoV-2 by FDA under an Emergency Use Authorization (EUA). This EUA will remain in effect (meaning this test can be used) for the duration of the COVID-19 declaration under Section 564(b)(1) of the Act, 21 U.S.C. section 360bbb-3(b)(1), unless the authorization is terminated or revoked.  Performed at [facility], [place] [address], [place], [place] [address]      Radiology Studies: ECHOCARDIOGRAM COMPLETE  Result Date: 08/25/2021    ECHOCARDIOGRAM REPORT   Patient Name:   CLEATUS GABRIEL [facility] Date of Exam: 08/25/2021 Medical Rec #:  [id number]             Height:       68.0 in Accession #:    [id number]            Weight:       184.1 lb Date of Birth:  [birth year]             BSA:          1.973 m Patient Age:    81 years              BP:           81/44 mmHg Patient Gender: M                     HR:           65 bpm. Exam Location:  Inpatient Procedure: 2D Echo, Cardiac Doppler, Color Doppler and Intracardiac            Opacification Agent Indications:    CHF  History:        Patient has prior history of Echocardiogram examinations, most                 recent 09/27/2015. CHF, CAD, Defibrillator, Arrythmias:Atrial                 Fibrillation, Signs/Symptoms:Chest Pain; Risk                 Factors:Hypertension, Dyslipidemia and Former Smoker.  Sonographer:    Wenda Low Referring Phys: [id number] [place]  1. Left ventricular ejection fraction, by estimation, is 20 to 25%. The left ventricle has severely decreased function. The left ventricle demonstrates global hypokinesis. The left ventricular internal cavity size was moderately to severely dilated. Left ventricular diastolic function could not be evaluated.  2. Right ventricular systolic function is low normal. The right ventricular size is mildly enlarged. There is moderately elevated pulmonary artery systolic pressure.  3. Left atrial size was moderately  dilated.  4. Right atrial size was severely dilated.  5. The mitral valve is grossly normal. Mild mitral valve regurgitation.  6.  Tricuspid valve regurgitation is moderate.  7. The aortic valve is tricuspid. Aortic valve regurgitation is not visualized. FINDINGS  Left Ventricle: Left ventricular ejection fraction, by estimation, is 20 to 25%. The left ventricle has severely decreased function. The left ventricle demonstrates global hypokinesis. Definity contrast agent was given IV to delineate the left ventricular endocardial borders. The left ventricular internal cavity size was moderately to severely dilated. There is borderline concentric left ventricular hypertrophy. Left ventricular diastolic function could not be evaluated due to atrial fibrillation. Left ventricular diastolic function could not be evaluated. Right Ventricle: The right ventricular size is mildly enlarged. Right vetricular wall thickness was not well visualized. Right ventricular systolic function is low normal. There is moderately elevated pulmonary artery systolic pressure. The tricuspid regurgitant velocity is 2.98 m/s, and with an assumed right atrial pressure of 15 mmHg, the estimated right ventricular systolic pressure is 25.0 mmHg. Left Atrium: Left atrial size was moderately dilated. Right Atrium: Right atrial size was severely dilated. Pericardium: There is no evidence of pericardial effusion. Mitral Valve: The mitral valve is grossly normal. Mild mitral valve regurgitation. MV peak gradient, 3.4 mmHg. The mean mitral valve gradient is 2.0 mmHg. Tricuspid Valve: The tricuspid valve is normal in structure. Tricuspid valve regurgitation is moderate. Aortic Valve: The aortic valve is tricuspid. Aortic valve regurgitation is not visualized. Aortic valve mean gradient measures 7.0 mmHg. Aortic valve peak gradient measures 13.5 mmHg. Aortic valve area, by VTI measures 1.21 cm. Pulmonic Valve: The pulmonic valve was normal in structure.  Pulmonic valve regurgitation is mild. Aorta: The aortic root and ascending aorta are structurally normal, with no evidence of dilitation. IAS/Shunts: The atrial septum is grossly normal. Additional Comments: A device lead is visualized.  LEFT VENTRICLE PLAX 2D LVIDd:         5.30 cm LVIDs:         4.95 cm LV PW:         1.30 cm LV IVS:        1.20 cm LVOT diam:     2.00 cm LV SV:         48 LV SV Index:   25 LVOT Area:     3.14 cm  LV Volumes (MOD) LV vol d, MOD A2C: 80.7 ml LV vol d, MOD A4C: 111.0 ml LV vol s, MOD A4C: 77.4 ml LV SV MOD A4C:     111.0 ml RIGHT VENTRICLE RV Basal diam:  4.50 cm RV Mid diam:    4.70 cm RV S prime:     7.62 cm/s TAPSE (M-mode): 1.7 cm LEFT ATRIUM           Index        RIGHT ATRIUM           Index LA diam:      4.00 cm 2.03 cm/m   RA Area:     29.00 cm LA Vol (A4C): 81.9 ml 41.51 ml/m  RA Volume:   115.00 ml 58.29 ml/m  AORTIC VALVE                     PULMONIC VALVE AV Area (Vmax):    1.29 cm      PV Vmax:       0.89 m/s AV Area (Vmean):   1.06 cm      PV Peak grad:  3.2 mmHg AV Area (VTI):     1.21 cm AV Vmax:  184.00 cm/s AV Vmean:          124.000 cm/s AV VTI:            0.401 m AV Peak Grad:      13.5 mmHg AV Mean Grad:      7.0 mmHg LVOT Vmax:         75.80 cm/s LVOT Vmean:        41.900 cm/s LVOT VTI:          0.154 m LVOT/AV VTI ratio: 0.38  AORTA Ao Root diam: 2.80 cm Ao Asc diam:  2.70 cm MITRAL VALVE               TRICUSPID VALVE MV Area (PHT): 3.95 cm    TR Peak grad:   35.5 mmHg MV Area VTI:   1.82 cm    TR Vmax:        298.00 cm/s MV Peak grad:  3.4 mmHg MV Mean grad:  2.0 mmHg    SHUNTS MV Vmax:       0.92 m/s    Systemic VTI:  0.15 m MV Vmean:      56.0 cm/s   Systemic Diam: 2.00 cm MV Decel Time: 192 msec MV E velocity: 73.30 cm/s MV A velocity: 42.70 cm/s MV E/A ratio:  1.72 Mertie Moores MD Electronically signed by Mertie Moores MD Signature Date/Time: 08/25/2021/3:48:21 PM    Final      Scheduled Meds:  apixaban  2.5 mg Oral Daily    atorvastatin  80 mg Oral Daily   carvedilol  3.125 mg Oral Daily   ezetimibe  10 mg Oral Daily   midodrine  10 mg Oral BID WC   potassium chloride  10 mEq Oral Daily   sodium chloride flush  3 mL Intravenous Q12H   Continuous Infusions:  sodium chloride     furosemide 160 mg (08/26/21 0630)     LOS: 2 days    Time spent: 78mn  PDomenic Polite MD [facility]   08/26/2021, 11:13 AM

## 2021-08-27 DIAGNOSIS — Z7901 Long term (current) use of anticoagulants: Secondary | ICD-10-CM | POA: Diagnosis not present

## 2021-08-27 DIAGNOSIS — I7143 Infrarenal abdominal aortic aneurysm, without rupture: Secondary | ICD-10-CM | POA: Diagnosis not present

## 2021-08-27 DIAGNOSIS — N184 Chronic kidney disease, stage 4 (severe): Secondary | ICD-10-CM | POA: Diagnosis not present

## 2021-08-27 DIAGNOSIS — I5043 Acute on chronic combined systolic (congestive) and diastolic (congestive) heart failure: Secondary | ICD-10-CM | POA: Diagnosis not present

## 2021-08-27 LAB — BASIC METABOLIC PANEL
Anion gap: 12 (ref 5–15)
BUN: 64 mg/dL — ABNORMAL HIGH (ref 8–23)
CO2: 18 mmol/L — ABNORMAL LOW (ref 22–32)
Calcium: 9.1 mg/dL (ref 8.9–10.3)
Chloride: 108 mmol/L (ref 98–111)
Creatinine, Ser: 4.63 mg/dL — ABNORMAL HIGH (ref 0.61–1.24)
GFR, Estimated: 12 mL/min — ABNORMAL LOW (ref >60)
Glucose, Bld: 86 mg/dL (ref 70–99)
Potassium: 5.1 mmol/L (ref 3.5–5.1)
Sodium: 138 mmol/L (ref 135–145)

## 2021-08-27 LAB — CBC
HCT: 40.8 % (ref 39.0–52.0)
Hemoglobin: 13.3 g/dL (ref 13.0–17.0)
MCH: 27.1 pg (ref 26.0–34.0)
MCHC: 32.6 g/dL (ref 30.0–36.0)
MCV: 83.3 fL (ref 80.0–100.0)
Platelets: 124 10*3/uL — ABNORMAL LOW (ref 150–400)
RBC: 4.9 MIL/uL (ref 4.22–5.81)
RDW: 15.9 % — ABNORMAL HIGH (ref 11.5–15.5)
WBC: 10.2 10*3/uL (ref 4.0–10.5)
nRBC: 0 % (ref 0.0–0.2)

## 2021-08-27 LAB — MAGNESIUM: Magnesium: 1.8 mg/dL (ref 1.7–2.4)

## 2021-08-27 MED ORDER — MAGNESIUM SULFATE 2 GM/50ML IV SOLN
2.0000 g | Freq: Once | INTRAVENOUS | Status: AC
  Administered 2021-08-27: 2 g via INTRAVENOUS
  Filled 2021-08-27: qty 50

## 2021-08-27 MED ORDER — APIXABAN 2.5 MG PO TABS
2.5000 mg | ORAL_TABLET | Freq: Two times a day (BID) | ORAL | Status: DC
  Administered 2021-08-27 – 2021-09-07 (×22): 2.5 mg via ORAL
  Filled 2021-08-27 (×22): qty 1

## 2021-08-27 NOTE — Progress Notes (Signed)
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  -In the ED chest x-ray noted small pleural effusions, UA with hematuria, CT abdomen noted cardiomegaly, anasarca,, moderate layering pleural effusions, small volume ascites and generalized body edema, hydroceles and infrarenal AAA. -Labs noted creatinine of 3.9, bicarb of 18, BNP 3914 -Cardiology, nephrology following, started on high-dose diuretics    Subjective: -Feels about the same, moving a little better, urinating some  Assessment and Plan:  * Acute on chronic combined systolic and diastolic CHF (congestive heart failure) (HCC) Progressive CKD 4 -Last echo from 2017, noted EF of 35-40% with grade 2 diastolic dysfunction, repeat echo with EF down to 20-25% -Appreciate nephrology input, started on Lasix 160 mg TID, poor response to diuretics thus far -Carvedilol dose decreased, continue midodrine -Will discuss with cards regarding inotropes, likely limited utility given advanced CKD -No indications for dialysis yet, monitor urine output, BMP daily  CRI (chronic renal insufficiency), stage 4 (severe) ([place]) -Baseline creatinine around 3.5, followed by [facility], has a AV fistula for a few years  -See discussion above  PAF (paroxysmal atrial fibrillation) ([place]) on chronic anticoagulation - Rate controlled continue Eliquis and Coreg  AAA (abdominal aortic aneurysm) Patient has a known AAA last evaluated by ultrasound 05/2021 noted to be 4 cm at that time.  Thrombocytopenia (HCC) -Mild, monitor   Essential  hypertension - Soft but stable, continue midodrine, -Imdur and hydralazine discontinued -Coreg dose decreased  ICD (implantable cardioverter-defibrillator) in place -s/p ICD for ischemic cardiomyopathy   DVT prophylaxis: Eliquis Code Status: Full code Family Communication: Discussed with patient in detail, no family at bedside Disposition Plan: Home pending improvement in volume status   Consultants:  Cards, nephrology    Procedures:   Antimicrobials:    Objective: Vitals:   08/26/21 1937 08/27/21 0301 08/27/21 1146 08/27/21 1313  BP: (!) 91/59 113/67 98/67   Pulse: 76 74 75 72  Resp: '16 16 18   '$ Temp: 97.7 F (36.5 C) 97.7 F (36.5 C) 98.1 F (36.7 C) 97.6 F (36.4 C)  TempSrc: Oral Oral Oral Oral  SpO2: 100% 100% 95%   Weight:  83.0 kg    Height:        Intake/Output Summary (Last 24 hours) at 08/27/2021 1357 Last data filed at 08/27/2021 1300 Gross per 24 hour  Intake 509.47 ml  Output 1425 ml  Net -915.53 ml   Filed Weights   08/25/21 0306 08/26/21 0427 08/27/21 0301  Weight: 83.5 kg 83.1 kg 82.2 kg    Examination:  General exam: Chronically ill male sitting up in bed, AAOx3, no distress HEENT: Positive JVD CVS: S1-S2, regular rhythm Lungs: Decreased breath sounds at the bases Abdomen: Soft, nontender, bowel sounds present, abdominal wall edema Extremities: 2+ edema Skin: No rashes Psychiatry:  Mood & affect appropriate.     Data Reviewed:   CBC: Recent Labs  Lab 08/24/21 0145 08/24/21 0318 08/27/21 0357  WBC 9.6  --  10.2  NEUTROABS 6.8  --   --   HGB 13.7 15.6 13.3  HCT 44.2 46.0 40.8  MCV 87.5  --  83.3  PLT  130*  --  202*   Basic Metabolic Panel: Recent Labs  Lab 08/24/21 0145 08/24/21 0318 08/24/21 1126 08/25/21 0328 08/26/21 0225 08/27/21 0357  NA 141 143  --  137 139 138  K 4.9 5.0  --  4.6 4.6 5.1  CL 114* 114*  --  110 110 108  CO2 18*  --   --  19* 20* 18*  GLUCOSE 104* 98  --  137* 93 86  BUN 51* 57*  --  57* 61*  64*  CREATININE 3.76* 3.90*  --  4.07* 4.45* 4.63*  CALCIUM 9.5  --   --  8.9 9.0 9.1  MG  --   --  1.7  --   --   --    GFR: Estimated Creatinine Clearance: 12.9 mL/min (A) (by C-G formula based on SCr of 4.63 mg/dL (H)). Liver Function Tests: Recent Labs  Lab 08/24/21 0145  AST 17  ALT 13  ALKPHOS 50  BILITOT 0.9  PROT 6.2*  ALBUMIN 3.4*   No results for input(s): LIPASE, AMYLASE in the last 168 hours. No results for input(s): AMMONIA in the last 168 hours. Coagulation Profile: No results for input(s): INR, PROTIME in the last 168 hours. Cardiac Enzymes: No results for input(s): CKTOTAL, CKMB, CKMBINDEX, TROPONINI in the last 168 hours. BNP (last 3 results) No results for input(s): PROBNP in the last 8760 hours. HbA1C: No results for input(s): HGBA1C in the last 72 hours. CBG: No results for input(s): GLUCAP in the last 168 hours. Lipid Profile: No results for input(s): CHOL, HDL, LDLCALC, TRIG, CHOLHDL, LDLDIRECT in the last 72 hours. Thyroid Function Tests: No results for input(s): TSH, T4TOTAL, FREET4, T3FREE, THYROIDAB in the last 72 hours.  Anemia Panel: No results for input(s): VITAMINB12, FOLATE, FERRITIN, TIBC, IRON, RETICCTPCT in the last 72 hours. Urine analysis:    Component Value Date/Time   COLORURINE AMBER (A) 08/24/2021 0404   APPEARANCEUR HAZY (A) 08/24/2021 0404   LABSPEC 1.018 08/24/2021 0404   PHURINE 5.0 08/24/2021 0404   GLUCOSEU NEGATIVE 08/24/2021 0404   HGBUR LARGE (A) 08/24/2021 0404   BILIRUBINUR NEGATIVE 08/24/2021 0404   KETONESUR NEGATIVE 08/24/2021 0404   PROTEINUR 100 (A) 08/24/2021 0404   NITRITE NEGATIVE 08/24/2021 0404   LEUKOCYTESUR NEGATIVE 08/24/2021 0404   Sepsis Labs: '@LABRCNTIP'$ (procalcitonin:4,lacticidven:4)  ) Recent Results (from the past 240 hour(s))  Resp Panel by RT-PCR (Flu A&B, Covid) Nasopharyngeal Swab     Status: None   Collection Time: 08/24/21  6:07 AM   Specimen: Nasopharyngeal Swab; Nasopharyngeal(NP)  swabs in vial transport medium  Result Value Ref Range Status   SARS Coronavirus 2 by RT PCR NEGATIVE NEGATIVE Final    Comment: (NOTE) SARS-CoV-2 target nucleic acids are NOT DETECTED.  The SARS-CoV-2 RNA is generally detectable in upper respiratory specimens during the acute phase of infection. The lowest concentration of SARS-CoV-2 viral copies this assay can detect is 138 copies/mL. A negative result does not preclude SARS-Cov-2 infection and should not be used as the sole basis for treatment or other patient management decisions. A negative result may occur with  improper specimen collection/handling, submission of specimen other than nasopharyngeal swab, presence of viral mutation(s) within the areas targeted by this assay, and inadequate number of viral copies(<138 copies/mL). A negative result must be combined with clinical observations, patient history, and epidemiological information. The expected result is Negative.  Fact Sheet for Patients:  EntrepreneurPulse.com.au  Fact Sheet for Healthcare Providers:  IncredibleEmployment.be  This test is no  t yet approved or cleared by the [place] and  has been authorized for detection and/or diagnosis of SARS-CoV-2 by FDA under an Emergency Use Authorization (EUA). This EUA will remain  in effect (meaning this test can be used) for the duration of the COVID-19 declaration under Section 564(b)(1) of the Act, 21 U.S.C.section 360bbb-3(b)(1), unless the authorization is terminated  or revoked sooner.       Influenza A by PCR NEGATIVE NEGATIVE Final   Influenza B by PCR NEGATIVE NEGATIVE Final    Comment: (NOTE) The Xpert Xpress SARS-CoV-2/FLU/RSV plus assay is intended as an aid in the diagnosis of influenza from Nasopharyngeal swab specimens and should not be used as a sole basis for treatment. Nasal washings and aspirates are unacceptable for Xpert Xpress  SARS-CoV-2/FLU/RSV testing.  Fact Sheet for Patients: EntrepreneurPulse.com.au  Fact Sheet for Healthcare Providers: IncredibleEmployment.be  This test is not yet approved or cleared by the [place] FDA and has been authorized for detection and/or diagnosis of SARS-CoV-2 by FDA under an Emergency Use Authorization (EUA). This EUA will remain in effect (meaning this test can be used) for the duration of the COVID-19 declaration under Section 564(b)(1) of the Act, 21 U.S.C. section 360bbb-3(b)(1), unless the authorization is terminated or revoked.  Performed at [facility], [place] [address], [place], [place] [address]      Radiology Studies: ECHOCARDIOGRAM COMPLETE  Result Date: 08/25/2021    ECHOCARDIOGRAM REPORT   Patient Name:   Anthony Foster [facility] Date of Exam: 08/25/2021 Medical Rec #:  [id number]             Height:       68.0 in Accession #:    [id number]            Weight:       184.1 lb Date of Birth:  1941-03-14             BSA:          1.973 m Patient Age:    63 years              BP:           81/44 mmHg Patient Gender: M                     HR:           65 bpm. Exam Location:  Inpatient Procedure: 2D Echo, Cardiac Doppler, Color Doppler and Intracardiac            Opacification Agent Indications:    CHF  History:        Patient has prior history of Echocardiogram examinations, most                 recent 09/27/2015. CHF, CAD, Defibrillator, Arrythmias:Atrial                 Fibrillation, Signs/Symptoms:Chest Pain; Risk                 Factors:Hypertension, Dyslipidemia and Former Smoker.  Sonographer:    Wenda Low Referring Phys: [id number] [place]  1. Left ventricular ejection fraction, by estimation, is 20 to 25%. The left ventricle has severely decreased function. The left ventricle demonstrates global hypokinesis. The left ventricular internal cavity size was moderately to severely dilated. Left ventricular  diastolic function could not be evaluated.  2. Right ventricular systolic function is low normal. The right ventricular size is mildly enlarged. There  is moderately elevated pulmonary artery systolic pressure.  3. Left atrial size was moderately dilated.  4. Right atrial size was severely dilated.  5. The mitral valve is grossly normal. Mild mitral valve regurgitation.  6. Tricuspid valve regurgitation is moderate.  7. The aortic valve is tricuspid. Aortic valve regurgitation is not visualized. FINDINGS  Left Ventricle: Left ventricular ejection fraction, by estimation, is 20 to 25%. The left ventricle has severely decreased function. The left ventricle demonstrates global hypokinesis. Definity contrast agent was given IV to delineate the left ventricular endocardial borders. The left ventricular internal cavity size was moderately to severely dilated. There is borderline concentric left ventricular hypertrophy. Left ventricular diastolic function could not be evaluated due to atrial fibrillation. Left ventricular diastolic function could not be evaluated. Right Ventricle: The right ventricular size is mildly enlarged. Right vetricular wall thickness was not well visualized. Right ventricular systolic function is low normal. There is moderately elevated pulmonary artery systolic pressure. The tricuspid regurgitant velocity is 2.98 m/s, and with an assumed right atrial pressure of 15 mmHg, the estimated right ventricular systolic pressure is 36.6 mmHg. Left Atrium: Left atrial size was moderately dilated. Right Atrium: Right atrial size was severely dilated. Pericardium: There is no evidence of pericardial effusion. Mitral Valve: The mitral valve is grossly normal. Mild mitral valve regurgitation. MV peak gradient, 3.4 mmHg. The mean mitral valve gradient is 2.0 mmHg. Tricuspid Valve: The tricuspid valve is normal in structure. Tricuspid valve regurgitation is moderate. Aortic Valve: The aortic valve is tricuspid.  Aortic valve regurgitation is not visualized. Aortic valve mean gradient measures 7.0 mmHg. Aortic valve peak gradient measures 13.5 mmHg. Aortic valve area, by VTI measures 1.21 cm. Pulmonic Valve: The pulmonic valve was normal in structure. Pulmonic valve regurgitation is mild. Aorta: The aortic root and ascending aorta are structurally normal, with no evidence of dilitation. IAS/Shunts: The atrial septum is grossly normal. Additional Comments: A device lead is visualized.  LEFT VENTRICLE PLAX 2D LVIDd:         5.30 cm LVIDs:         4.95 cm LV PW:         1.30 cm LV IVS:        1.20 cm LVOT diam:     2.00 cm LV SV:         48 LV SV Index:   25 LVOT Area:     3.14 cm  LV Volumes (MOD) LV vol d, MOD A2C: 80.7 ml LV vol d, MOD A4C: 111.0 ml LV vol s, MOD A4C: 77.4 ml LV SV MOD A4C:     111.0 ml RIGHT VENTRICLE RV Basal diam:  4.50 cm RV Mid diam:    4.70 cm RV S prime:     7.62 cm/s TAPSE (M-mode): 1.7 cm LEFT ATRIUM           Index        RIGHT ATRIUM           Index LA diam:      4.00 cm 2.03 cm/m   RA Area:     29.00 cm LA Vol (A4C): 81.9 ml 41.51 ml/m  RA Volume:   115.00 ml 58.29 ml/m  AORTIC VALVE                     PULMONIC VALVE AV Area (Vmax):    1.29 cm      PV Vmax:       0.89 m/s AV Area (  Vmean):   1.06 cm      PV Peak grad:  3.2 mmHg AV Area (VTI):     1.21 cm AV Vmax:           184.00 cm/s AV Vmean:          124.000 cm/s AV VTI:            0.401 m AV Peak Grad:      13.5 mmHg AV Mean Grad:      7.0 mmHg LVOT Vmax:         75.80 cm/s LVOT Vmean:        41.900 cm/s LVOT VTI:          0.154 m LVOT/AV VTI ratio: 0.38  AORTA Ao Root diam: 2.80 cm Ao Asc diam:  2.70 cm MITRAL VALVE               TRICUSPID VALVE MV Area (PHT): 3.95 cm    TR Peak grad:   35.5 mmHg MV Area VTI:   1.82 cm    TR Vmax:        298.00 cm/s MV Peak grad:  3.4 mmHg MV Mean grad:  2.0 mmHg    SHUNTS MV Vmax:       0.92 m/s    Systemic VTI:  0.15 m MV Vmean:      56.0 cm/s   Systemic Diam: 2.00 cm MV Decel Time: 192 msec MV  E velocity: 73.30 cm/s MV A velocity: 42.70 cm/s MV E/A ratio:  1.72 Mertie Moores MD Electronically signed by Mertie Moores MD Signature Date/Time: 08/25/2021/3:48:21 PM    Final      Scheduled Meds:  apixaban  2.5 mg Oral BID   atorvastatin  80 mg Oral Daily   carvedilol  3.125 mg Oral Daily   ezetimibe  10 mg Oral Daily   midodrine  10 mg Oral BID WC   sodium chloride flush  3 mL Intravenous Q12H   Continuous Infusions:  sodium chloride     furosemide 66 mL/hr at 08/27/21 0545   magnesium sulfate bolus IVPB       LOS: 3 days    Time spent: 56mn  PDomenic Polite MD [facility]   08/27/2021, 1:57 PM

## 2021-08-27 NOTE — Progress Notes (Signed)
Patient have 12 beats of V.tach asymptomatic, vital stable. Card PA notified see epic for intervention. Will continue to monitor. ?

## 2021-08-27 NOTE — Progress Notes (Signed)
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  History of paroxysmal atrial fibrillation and AICD placement.  History of COPD.  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  Creatinine 3.9 mg/dL when last noted.  Was evaluated for progression of renal disease.  Recommendations to continue diuretics.  Blood pressure 130/67 pulse 74 temperature 97.7 O2 sats 100% room air  Sodium 138 potassium 5.1 chloride 108 CO2 18 BUN 64 creatinine 4.63 glucose 86 calcium 9.1 hemoglobin 13.3  Objective:  Vital signs in last 24 hours:  Temp:  [97.7 F (36.5 C)-97.9 F (36.6 C)] 97.7 F (36.5 C) (03/06 0301) Pulse Rate:  [68-77] 74 (03/06 0301) Resp:  [16-18] 16 (03/06 0301) BP: (91-113)/(57-74) 113/67 (03/06 0301) SpO2:  [100 %] 100 % (03/06 0301) Weight:  [82.2 kg] 82.2 kg (03/06 0301)  Weight change: -0.862 kg Filed Weights   08/25/21 0306 08/26/21 0427 08/27/21 0301  Weight: 83.5 kg 83.1 kg 82.2 kg    Intake/Output: I/O last 3 completed shifts: In: 447.6 [P.O.:180; I.V.:3; IV Piggyback:264.6] Out: 1850 [Urine:1850]   Intake/Output this shift:  Total I/O In: 0.9 [P.O.:0.9] Out: -   CVS- RRR RS- CTA ABD- BS present soft non-distended EXT-3+ lower extremity edema   Basic Metabolic Panel: Recent Labs  Lab 08/24/21 0145 08/24/21 0318 08/24/21 1126 08/25/21 0328 08/26/21 0225 08/27/21 0357  NA 141 143  --  137 139 138  K 4.9 5.0  --  4.6 4.6 5.1  CL 114* 114*  --  110 110 108  CO2 18*  --   --  19* 20* 18*  GLUCOSE 104* 98  --  137* 93 86  BUN 51* 57*  --  57* 61* 64*  CREATININE 3.76* 3.90*  --  4.07* 4.45* 4.63*  CALCIUM 9.5  --   --  8.9 9.0 9.1  MG  --   --  1.7  --   --   --     Liver Function Tests: Recent Labs  Lab 08/24/21 0145  AST 17  ALT 13   ALKPHOS 50  BILITOT 0.9  PROT 6.2*  ALBUMIN 3.4*   No results for input(s): LIPASE, AMYLASE in the last 168 hours. No results for input(s): AMMONIA in the last 168 hours.  CBC: Recent Labs  Lab 08/24/21 0145 08/24/21 0318 08/27/21 0357  WBC 9.6  --  10.2  NEUTROABS 6.8  --   --   HGB 13.7 15.6 13.3  HCT 44.2 46.0 40.8  MCV 87.5  --  83.3  PLT 130*  --  124*    Cardiac Enzymes: No results for input(s): CKTOTAL, CKMB, CKMBINDEX, TROPONINI in the last 168 hours.  BNP: Invalid input(s): POCBNP  CBG: No results for input(s): GLUCAP in the last 168 hours.  Microbiology: Results for orders placed or performed during the hospital encounter of 08/24/21  Resp Panel by RT-PCR (Flu A&B, Covid) Nasopharyngeal Swab     Status: None   Collection Time: 08/24/21  6:07 AM   Specimen: Nasopharyngeal Swab; Nasopharyngeal(NP) swabs in vial transport medium  Result Value Ref Range Status   SARS Coronavirus 2 by RT PCR NEGATIVE NEGATIVE Final    Comment: (NOTE) SARS-CoV-2 target nucleic acids are NOT DETECTED.  The SARS-CoV-2 RNA is generally detectable in upper respiratory specimens during the acute  phase of infection. The lowest concentration of SARS-CoV-2 viral copies this assay can detect is 138 copies/mL. A negative result does not preclude SARS-Cov-2 infection and should not be used as the sole basis for treatment or other patient management decisions. A negative result may occur with  improper specimen collection/handling, submission of specimen other than nasopharyngeal swab, presence of viral mutation(s) within the areas targeted by this assay, and inadequate number of viral copies(<138 copies/mL). A negative result must be combined with clinical observations, patient history, and epidemiological information. The expected result is Negative.  Fact Sheet for Patients:  BloggerCourse.com  Fact Sheet for Healthcare Providers:   SeriousBroker.it  This test is no t yet approved or cleared by the [place] FDA and  has been authorized for detection and/or diagnosis of SARS-CoV-2 by FDA under an Emergency Use Authorization (EUA). This EUA will remain  in effect (meaning this test can be used) for the duration of the COVID-19 declaration under Section 564(b)(1) of the Act, 21 U.S.C.section 360bbb-3(b)(1), unless the authorization is terminated  or revoked sooner.       Influenza A by PCR NEGATIVE NEGATIVE Final   Influenza B by PCR NEGATIVE NEGATIVE Final    Comment: (NOTE) The Xpert Xpress SARS-CoV-2/FLU/RSV plus assay is intended as an aid in the diagnosis of influenza from Nasopharyngeal swab specimens and should not be used as a sole basis for treatment. Nasal washings and aspirates are unacceptable for Xpert Xpress SARS-CoV-2/FLU/RSV testing.  Fact Sheet for Patients: BloggerCourse.com  Fact Sheet for Healthcare Providers: SeriousBroker.it  This test is not yet approved or cleared by the [place] FDA and has been authorized for detection and/or diagnosis of SARS-CoV-2 by FDA under an Emergency Use Authorization (EUA). This EUA will remain in effect (meaning this test can be used) for the duration of the COVID-19 declaration under Section 564(b)(1) of the Act, 21 U.S.C. section 360bbb-3(b)(1), unless the authorization is terminated or revoked.  Performed at [facility], [address], [place], [place] [address]     Coagulation Studies: No results for input(s): LABPROT, INR in the last 72 hours.  Urinalysis: No results for input(s): COLORURINE, LABSPEC, PHURINE, GLUCOSEU, HGBUR, BILIRUBINUR, KETONESUR, PROTEINUR, UROBILINOGEN, NITRITE, LEUKOCYTESUR in the last 72 hours.  Invalid input(s): APPERANCEUR    Imaging: ECHOCARDIOGRAM COMPLETE  Result Date: 08/25/2021    ECHOCARDIOGRAM REPORT   Patient Name:    Anthony Foster [facility] Date of Exam: 08/25/2021 Medical Rec #:  [id number]             Height:       68.0 in Accession #:    [id number]            Weight:       184.1 lb Date of Birth:  1941/04/11             BSA:          1.973 m Patient Age:    81 years              BP:           81/44 mmHg Patient Gender: M                     HR:           65 bpm. Exam Location:  Inpatient Procedure: 2D Echo, Cardiac Doppler, Color Doppler and Intracardiac            Opacification Agent Indications:    CHF  History:        Patient has prior history of Echocardiogram examinations, most                 recent 09/27/2015. CHF, CAD, Defibrillator, Arrythmias:Atrial                 Fibrillation, Signs/Symptoms:Chest Pain; Risk                 Factors:Hypertension, Dyslipidemia and Former Smoker.  Sonographer:    Mikki Harbor Referring Phys: [id number] ANGELA NICOLE DUKE IMPRESSIONS  1. Left ventricular ejection fraction, by estimation, is 20 to 25%. The left ventricle has severely decreased function. The left ventricle demonstrates global hypokinesis. The left ventricular internal cavity size was moderately to severely dilated. Left ventricular diastolic function could not be evaluated.  2. Right ventricular systolic function is low normal. The right ventricular size is mildly enlarged. There is moderately elevated pulmonary artery systolic pressure.  3. Left atrial size was moderately dilated.  4. Right atrial size was severely dilated.  5. The mitral valve is grossly normal. Mild mitral valve regurgitation.  6. Tricuspid valve regurgitation is moderate.  7. The aortic valve is tricuspid. Aortic valve regurgitation is not visualized. FINDINGS  Left Ventricle: Left ventricular ejection fraction, by estimation, is 20 to 25%. The left ventricle has severely decreased function. The left ventricle demonstrates global hypokinesis. Definity contrast agent was given IV to delineate the left ventricular endocardial borders. The left ventricular  internal cavity size was moderately to severely dilated. There is borderline concentric left ventricular hypertrophy. Left ventricular diastolic function could not be evaluated due to atrial fibrillation. Left ventricular diastolic function could not be evaluated. Right Ventricle: The right ventricular size is mildly enlarged. Right vetricular wall thickness was not well visualized. Right ventricular systolic function is low normal. There is moderately elevated pulmonary artery systolic pressure. The tricuspid regurgitant velocity is 2.98 m/s, and with an assumed right atrial pressure of 15 mmHg, the estimated right ventricular systolic pressure is 50.5 mmHg. Left Atrium: Left atrial size was moderately dilated. Right Atrium: Right atrial size was severely dilated. Pericardium: There is no evidence of pericardial effusion. Mitral Valve: The mitral valve is grossly normal. Mild mitral valve regurgitation. MV peak gradient, 3.4 mmHg. The mean mitral valve gradient is 2.0 mmHg. Tricuspid Valve: The tricuspid valve is normal in structure. Tricuspid valve regurgitation is moderate. Aortic Valve: The aortic valve is tricuspid. Aortic valve regurgitation is not visualized. Aortic valve mean gradient measures 7.0 mmHg. Aortic valve peak gradient measures 13.5 mmHg. Aortic valve area, by VTI measures 1.21 cm. Pulmonic Valve: The pulmonic valve was normal in structure. Pulmonic valve regurgitation is mild. Aorta: The aortic root and ascending aorta are structurally normal, with no evidence of dilitation. IAS/Shunts: The atrial septum is grossly normal. Additional Comments: A device lead is visualized.  LEFT VENTRICLE PLAX 2D LVIDd:         5.30 cm LVIDs:         4.95 cm LV PW:         1.30 cm LV IVS:        1.20 cm LVOT diam:     2.00 cm LV SV:         48 LV SV Index:   25 LVOT Area:     3.14 cm  LV Volumes (MOD) LV vol d, MOD A2C: 80.7 ml LV vol d, MOD A4C: 111.0 ml LV vol s, MOD A4C: 77.4 ml LV SV MOD A4C:  111.0 ml  RIGHT VENTRICLE RV Basal diam:  4.50 cm RV Mid diam:    4.70 cm RV S prime:     7.62 cm/s TAPSE (M-mode): 1.7 cm LEFT ATRIUM           Index        RIGHT ATRIUM           Index LA diam:      4.00 cm 2.03 cm/m   RA Area:     29.00 cm LA Vol (A4C): 81.9 ml 41.51 ml/m  RA Volume:   115.00 ml 58.29 ml/m  AORTIC VALVE                     PULMONIC VALVE AV Area (Vmax):    1.29 cm      PV Vmax:       0.89 m/s AV Area (Vmean):   1.06 cm      PV Peak grad:  3.2 mmHg AV Area (VTI):     1.21 cm AV Vmax:           184.00 cm/s AV Vmean:          124.000 cm/s AV VTI:            0.401 m AV Peak Grad:      13.5 mmHg AV Mean Grad:      7.0 mmHg LVOT Vmax:         75.80 cm/s LVOT Vmean:        41.900 cm/s LVOT VTI:          0.154 m LVOT/AV VTI ratio: 0.38  AORTA Ao Root diam: 2.80 cm Ao Asc diam:  2.70 cm MITRAL VALVE               TRICUSPID VALVE MV Area (PHT): 3.95 cm    TR Peak grad:   35.5 mmHg MV Area VTI:   1.82 cm    TR Vmax:        298.00 cm/s MV Peak grad:  3.4 mmHg MV Mean grad:  2.0 mmHg    SHUNTS MV Vmax:       0.92 m/s    Systemic VTI:  0.15 m MV Vmean:      56.0 cm/s   Systemic Diam: 2.00 cm MV Decel Time: 192 msec MV E velocity: 73.30 cm/s MV A velocity: 42.70 cm/s MV E/A ratio:  1.72 Kristeen Miss MD Electronically signed by Kristeen Miss MD Signature Date/Time: 08/25/2021/3:48:21 PM    Final      Medications:    sodium chloride     furosemide 66 mL/hr at 08/27/21 0545    apixaban  2.5 mg Oral Daily   atorvastatin  80 mg Oral Daily   carvedilol  3.125 mg Oral Daily   ezetimibe  10 mg Oral Daily   midodrine  10 mg Oral BID WC   sodium chloride flush  3 mL Intravenous Q12H   sodium chloride, acetaminophen, ondansetron (ZOFRAN) IV, oxyCODONE-acetaminophen, sodium chloride flush  Assessment/ Plan:  Chronic kidney disease stage IV/V.  Does not appear to be in any need of urgent dialysis at this point. ANEMIA-stable. MBD-stable HTN/VOL-requires midodrine.  Continue IV Lasix for  diuresis. ACCESS-has AV fistula in place.    LOS: 3 Garnetta Buddy @TODAY @8 :26 AM

## 2021-08-27 NOTE — TOC Progression Note (Signed)
Transition of Care (TOC) - Progression Note  ? ? ?Patient Details  ?Name: Anthony Foster ?MRN: [id number] ?Date of Birth: [birth year] ? ?Transition of Care (TOC) CM/SW Contact  ?Zenon Mayo, RN ?Phone Number: ?08/27/2021, 2:17 PM ? ?Clinical Narrative:    ?from home with grand kids, CKD 4, has a AV fistula, CHF, afib, on eliquis, lasix 160 mg bid.  TOC will continue to follow for dc needs. ? ? ?  ?  ? ?Expected Discharge Plan and Services ?  ?  ?  ?  ?  ?                ?  ?  ?  ?  ?  ?  ?  ?  ?  ?  ? ? ?Social Determinants of Health (SDOH) Interventions ?  ? ?Readmission Risk Interventions ?No flowsheet data found. ? ?

## 2021-08-27 NOTE — Progress Notes (Signed)
Progress Note  Patient Name: Anthony Foster Date of Encounter: 08/27/2021  [facility] Cardiologist: Kirk Ruths, MD   Subjective   Feeling well.  Breathing much better.   Inpatient Medications    Scheduled Meds:  apixaban  2.5 mg Oral BID   atorvastatin  80 mg Oral Daily   carvedilol  3.125 mg Oral Daily   ezetimibe  10 mg Oral Daily   midodrine  10 mg Oral BID WC   sodium chloride flush  3 mL Intravenous Q12H   Continuous Infusions:  sodium chloride     furosemide 66 mL/hr at 08/27/21 0545   PRN Meds: sodium chloride, acetaminophen, ondansetron (ZOFRAN) IV, oxyCODONE-acetaminophen, sodium chloride flush   Vital Signs    Vitals:   08/26/21 1538 08/26/21 1541 08/26/21 1937 08/27/21 0301  BP: (!) 105/57 101/61 (!) 91/59 113/67  Pulse: 70 68 76 74  Resp:   16 16  Temp:   97.7 F (36.5 C) 97.7 F (36.5 C)  TempSrc:   Oral Oral  SpO2: 100% 100% 100% 100%  Weight:    82.2 kg  Height:        Intake/Output Summary (Last 24 hours) at 08/27/2021 1136 Last data filed at 08/27/2021 1000 Gross per 24 hour  Intake 509.47 ml  Output 1600 ml  Net -1090.53 ml   Last 3 Weights 08/27/2021 08/26/2021 08/25/2021  Weight (lbs) 181 lb 4.8 oz 183 lb 3.2 oz 184 lb 1.6 oz  Weight (kg) 82.237 kg 83.099 kg 83.507 kg      Telemetry    Atrial fibrillation.  Rate 60s.  PVCs.  12 beats NSVT.  - Personally Reviewed  ECG    N/a - Personally Reviewed  Physical Exam   VS:  BP 113/67 (BP Location: Left Arm)    Pulse 74    Temp 97.7 F (36.5 C) (Oral)    Resp 16    Ht '5\' 8"'$  (1.727 m)    Wt 82.2 kg    SpO2 100%    BMI 27.57 kg/m  , BMI Body mass index is 27.57 kg/m. GENERAL:  Well appearing.  Lying comfortably in bed.  HEENT: Pupils equal round and reactive, fundi not visualized, oral mucosa unremarkable NECK:  +jugular venous distention, waveform within normal limits, carotid upstroke brisk and symmetric, no bruits, no thyromegaly LUNGS:  Clear to auscultation  bilaterally HEART:  RRR.  PMI not displaced or sustained,S1 and S2 within normal limits, no S3, no S4, no clicks, no rubs, no murmurs ABD:  Flat, positive bowel sounds normal in frequency in pitch, no bruits, no rebound, no guarding, no midline pulsatile mass, no hepatomegaly, no splenomegaly EXT:  2 plus pulses throughout, 1+ LE edema to mid tibia bilaterally, no cyanosis no clubbing SKIN:  No rashes no nodules NEURO:  Cranial nerves II through XII grossly intact, motor grossly intact throughout PSYCH:  Cognitively intact, oriented to person place and time   Labs    High Sensitivity Troponin:   Recent Labs  Lab 08/24/21 1126 08/24/21 1455  TROPONINIHS 113* 105*     Chemistry Recent Labs  Lab 08/24/21 0145 08/24/21 0318 08/24/21 1126 08/25/21 0328 08/26/21 0225 08/27/21 0357  NA 141   < >  --  137 139 138  K 4.9   < >  --  4.6 4.6 5.1  CL 114*   < >  --  110 110 108  CO2 18*  --   --  19* 20* 18*  GLUCOSE 104*   < >  --  137* 93 86  BUN 51*   < >  --  57* 61* 64*  CREATININE 3.76*   < >  --  4.07* 4.45* 4.63*  CALCIUM 9.5  --   --  8.9 9.0 9.1  MG  --   --  1.7  --   --   --   PROT 6.2*  --   --   --   --   --   ALBUMIN 3.4*  --   --   --   --   --   AST 17  --   --   --   --   --   ALT 13  --   --   --   --   --   ALKPHOS 50  --   --   --   --   --   BILITOT 0.9  --   --   --   --   --   GFRNONAA 15*  --   --  14* 13* 12*  ANIONGAP 9  --   --  '8 9 12   '$ < > = values in this interval not displayed.    Lipids No results for input(s): CHOL, TRIG, HDL, LABVLDL, LDLCALC, CHOLHDL in the last 168 hours.  Hematology Recent Labs  Lab 08/24/21 0145 08/24/21 0318 08/27/21 0357  WBC 9.6  --  10.2  RBC 5.05  --  4.90  HGB 13.7 15.6 13.3  HCT 44.2 46.0 40.8  MCV 87.5  --  83.3  MCH 27.1  --  27.1  MCHC 31.0  --  32.6  RDW 16.1*  --  15.9*  PLT 130*  --  124*   Thyroid  Recent Labs  Lab 08/24/21 0959  TSH 2.135    BNP Recent Labs  Lab 08/24/21 0145  BNP  3,914.4*    DDimer No results for input(s): DDIMER in the last 168 hours.   Radiology    ECHOCARDIOGRAM COMPLETE  Result Date: 08/25/2021    ECHOCARDIOGRAM REPORT   Patient Name:   THEODOROS STJAMES [facility] Date of Exam: 08/25/2021 Medical Rec #:  [id number]             Height:       68.0 in Accession #:    [id number]            Weight:       184.1 lb Date of Birth:  1941-03-07             BSA:          1.973 m Patient Age:    63 years              BP:           81/44 mmHg Patient Gender: M                     HR:           65 bpm. Exam Location:  Inpatient Procedure: 2D Echo, Cardiac Doppler, Color Doppler and Intracardiac            Opacification Agent Indications:    CHF  History:        Patient has prior history of Echocardiogram examinations, most                 recent 09/27/2015. CHF, CAD, Defibrillator, Arrythmias:Atrial                 Fibrillation,  Signs/Symptoms:Chest Pain; Risk                 Factors:Hypertension, Dyslipidemia and Former Smoker.  Sonographer:    Wenda Low Referring Phys: [id number] Leonardo  1. Left ventricular ejection fraction, by estimation, is 20 to 25%. The left ventricle has severely decreased function. The left ventricle demonstrates global hypokinesis. The left ventricular internal cavity size was moderately to severely dilated. Left ventricular diastolic function could not be evaluated.  2. Right ventricular systolic function is low normal. The right ventricular size is mildly enlarged. There is moderately elevated pulmonary artery systolic pressure.  3. Left atrial size was moderately dilated.  4. Right atrial size was severely dilated.  5. The mitral valve is grossly normal. Mild mitral valve regurgitation.  6. Tricuspid valve regurgitation is moderate.  7. The aortic valve is tricuspid. Aortic valve regurgitation is not visualized. FINDINGS  Left Ventricle: Left ventricular ejection fraction, by estimation, is 20 to 25%. The left ventricle has  severely decreased function. The left ventricle demonstrates global hypokinesis. Definity contrast agent was given IV to delineate the left ventricular endocardial borders. The left ventricular internal cavity size was moderately to severely dilated. There is borderline concentric left ventricular hypertrophy. Left ventricular diastolic function could not be evaluated due to atrial fibrillation. Left ventricular diastolic function could not be evaluated. Right Ventricle: The right ventricular size is mildly enlarged. Right vetricular wall thickness was not well visualized. Right ventricular systolic function is low normal. There is moderately elevated pulmonary artery systolic pressure. The tricuspid regurgitant velocity is 2.98 m/s, and with an assumed right atrial pressure of 15 mmHg, the estimated right ventricular systolic pressure is 46.5 mmHg. Left Atrium: Left atrial size was moderately dilated. Right Atrium: Right atrial size was severely dilated. Pericardium: There is no evidence of pericardial effusion. Mitral Valve: The mitral valve is grossly normal. Mild mitral valve regurgitation. MV peak gradient, 3.4 mmHg. The mean mitral valve gradient is 2.0 mmHg. Tricuspid Valve: The tricuspid valve is normal in structure. Tricuspid valve regurgitation is moderate. Aortic Valve: The aortic valve is tricuspid. Aortic valve regurgitation is not visualized. Aortic valve mean gradient measures 7.0 mmHg. Aortic valve peak gradient measures 13.5 mmHg. Aortic valve area, by VTI measures 1.21 cm. Pulmonic Valve: The pulmonic valve was normal in structure. Pulmonic valve regurgitation is mild. Aorta: The aortic root and ascending aorta are structurally normal, with no evidence of dilitation. IAS/Shunts: The atrial septum is grossly normal. Additional Comments: A device lead is visualized.  LEFT VENTRICLE PLAX 2D LVIDd:         5.30 cm LVIDs:         4.95 cm LV PW:         1.30 cm LV IVS:        1.20 cm LVOT diam:     2.00  cm LV SV:         48 LV SV Index:   25 LVOT Area:     3.14 cm  LV Volumes (MOD) LV vol d, MOD A2C: 80.7 ml LV vol d, MOD A4C: 111.0 ml LV vol s, MOD A4C: 77.4 ml LV SV MOD A4C:     111.0 ml RIGHT VENTRICLE RV Basal diam:  4.50 cm RV Mid diam:    4.70 cm RV S prime:     7.62 cm/s TAPSE (M-mode): 1.7 cm LEFT ATRIUM           Index  RIGHT ATRIUM           Index LA diam:      4.00 cm 2.03 cm/m   RA Area:     29.00 cm LA Vol (A4C): 81.9 ml 41.51 ml/m  RA Volume:   115.00 ml 58.29 ml/m  AORTIC VALVE                     PULMONIC VALVE AV Area (Vmax):    1.29 cm      PV Vmax:       0.89 m/s AV Area (Vmean):   1.06 cm      PV Peak grad:  3.2 mmHg AV Area (VTI):     1.21 cm AV Vmax:           184.00 cm/s AV Vmean:          124.000 cm/s AV VTI:            0.401 m AV Peak Grad:      13.5 mmHg AV Mean Grad:      7.0 mmHg LVOT Vmax:         75.80 cm/s LVOT Vmean:        41.900 cm/s LVOT VTI:          0.154 m LVOT/AV VTI ratio: 0.38  AORTA Ao Root diam: 2.80 cm Ao Asc diam:  2.70 cm MITRAL VALVE               TRICUSPID VALVE MV Area (PHT): 3.95 cm    TR Peak grad:   35.5 mmHg MV Area VTI:   1.82 cm    TR Vmax:        298.00 cm/s MV Peak grad:  3.4 mmHg MV Mean grad:  2.0 mmHg    SHUNTS MV Vmax:       0.92 m/s    Systemic VTI:  0.15 m MV Vmean:      56.0 cm/s   Systemic Diam: 2.00 cm MV Decel Time: 192 msec MV E velocity: 73.30 cm/s MV A velocity: 42.70 cm/s MV E/A ratio:  1.72 Mertie Moores MD Electronically signed by Mertie Moores MD Signature Date/Time: 08/25/2021/3:48:21 PM    Final     Cardiac Studies   Echo 08/25/21:  1. Left ventricular ejection fraction, by estimation, is 20 to 25%. The  left ventricle has severely decreased function. The left ventricle  demonstrates global hypokinesis. The left ventricular internal cavity size  was moderately to severely dilated.  Left ventricular diastolic function could not be evaluated.   2. Right ventricular systolic function is low normal. The right  ventricular  size is mildly enlarged. There is moderately elevated  pulmonary artery systolic pressure.   3. Left atrial size was moderately dilated.   4. Right atrial size was severely dilated.   5. The mitral valve is grossly normal. Mild mitral valve regurgitation.   6. Tricuspid valve regurgitation is moderate.   7. The aortic valve is tricuspid. Aortic valve regurgitation is not  visualized.   Patient Profile     81 y.o. male with chronic systolic and diastolic heart failure and CKD IV/V admitted with acute on chronic heart failure in the setting of nonadherence to medications.   Assessment & Plan    #  Acute on chronic systolic and diastolic heart failure:  Continues to slowly diurese.  Symptoms much better.  He understands the importance of taking his meds.  On midodrine for BP support.  BP too  low for GDMT. Continue carvedilol.   # Atrial fibrillation:  Rate stable on carvedilol.  Continue Eliquis.  # Aortic aneurysm:  BP is low.  Continue surveillance as outpatient.   # Tobacco abuse:  Continue to support working on cessation.  Continue beta blocker.       For questions or updates, please contact [place] Please consult [email or web address] for contact info under        Signed, Skeet Latch, MD  08/27/2021, 11:36 AM

## 2021-08-27 NOTE — Progress Notes (Signed)
Heart Failure Nurse Navigator Progress Note ? ?Following this hospitalization to assess for HV TOC readiness. Currently SCr increased from baseline of 3.5, will continue to monitor to assess for appropriateness pending significant renal improvement. Possibility of need for HD, pt has prior AVF (not used).  ? ?Earnestine Leys, BSN, RN ?Heart Failure Nurse Navigator ?[phone] ? ?

## 2021-08-28 ENCOUNTER — Inpatient Hospital Stay (HOSPITAL_COMMUNITY)
Admission: EM | Disposition: A | Discharge status: Hospice | Payer: Self-pay | Source: Home / Self Care | Attending: Internal Medicine

## 2021-08-28 ENCOUNTER — Encounter (HOSPITAL_COMMUNITY): Payer: Self-pay | Admitting: Internal Medicine

## 2021-08-28 DIAGNOSIS — N184 Chronic kidney disease, stage 4 (severe): Secondary | ICD-10-CM | POA: Diagnosis not present

## 2021-08-28 DIAGNOSIS — I10 Essential (primary) hypertension: Secondary | ICD-10-CM | POA: Diagnosis not present

## 2021-08-28 DIAGNOSIS — Z7901 Long term (current) use of anticoagulants: Secondary | ICD-10-CM | POA: Diagnosis not present

## 2021-08-28 DIAGNOSIS — I7143 Infrarenal abdominal aortic aneurysm, without rupture: Secondary | ICD-10-CM | POA: Diagnosis not present

## 2021-08-28 DIAGNOSIS — I5043 Acute on chronic combined systolic (congestive) and diastolic (congestive) heart failure: Secondary | ICD-10-CM | POA: Diagnosis not present

## 2021-08-28 HISTORY — PX: CENTRAL LINE INSERTION: CATH118232

## 2021-08-28 HISTORY — PX: RIGHT HEART CATH: CATH118263

## 2021-08-28 LAB — POCT I-STAT EG7
Acid-base deficit: 5 mmol/L — ABNORMAL HIGH (ref 0.0–2.0)
Acid-base deficit: 5 mmol/L — ABNORMAL HIGH (ref 0.0–2.0)
Bicarbonate: 19.7 mmol/L — ABNORMAL LOW (ref 20.0–28.0)
Bicarbonate: 19.9 mmol/L — ABNORMAL LOW (ref 20.0–28.0)
Calcium, Ion: 1.22 mmol/L (ref 1.15–1.40)
Calcium, Ion: 1.23 mmol/L (ref 1.15–1.40)
HCT: 43 % (ref 39.0–52.0)
HCT: 43 % (ref 39.0–52.0)
Hemoglobin: 14.6 g/dL (ref 13.0–17.0)
Hemoglobin: 14.6 g/dL (ref 13.0–17.0)
O2 Saturation: 70 %
O2 Saturation: 71 %
Potassium: 4.6 mmol/L (ref 3.5–5.1)
Potassium: 4.6 mmol/L (ref 3.5–5.1)
Sodium: 138 mmol/L (ref 135–145)
Sodium: 138 mmol/L (ref 135–145)
TCO2: 21 mmol/L — ABNORMAL LOW (ref 22–32)
TCO2: 21 mmol/L — ABNORMAL LOW (ref 22–32)
pCO2, Ven: 35.2 mmHg — ABNORMAL LOW (ref 44–60)
pCO2, Ven: 36 mmHg — ABNORMAL LOW (ref 44–60)
pH, Ven: 7.351 (ref 7.25–7.43)
pH, Ven: 7.356 (ref 7.25–7.43)
pO2, Ven: 38 mmHg (ref 32–45)
pO2, Ven: 39 mmHg (ref 32–45)

## 2021-08-28 LAB — BASIC METABOLIC PANEL
Anion gap: 13 (ref 5–15)
BUN: 72 mg/dL — ABNORMAL HIGH (ref 8–23)
CO2: 18 mmol/L — ABNORMAL LOW (ref 22–32)
Calcium: 9.2 mg/dL (ref 8.9–10.3)
Chloride: 107 mmol/L (ref 98–111)
Creatinine, Ser: 4.78 mg/dL — ABNORMAL HIGH (ref 0.61–1.24)
GFR, Estimated: 11 mL/min — ABNORMAL LOW (ref >60)
Glucose, Bld: 96 mg/dL (ref 70–99)
Potassium: 4.7 mmol/L (ref 3.5–5.1)
Sodium: 138 mmol/L (ref 135–145)

## 2021-08-28 SURGERY — RIGHT HEART CATH

## 2021-08-28 MED ORDER — HYDRALAZINE HCL 20 MG/ML IJ SOLN: 10.0000 mg | INTRAMUSCULAR | Status: AC | PRN

## 2021-08-28 MED ORDER — SODIUM CHLORIDE 0.9% FLUSH: 3.0000 mL | Freq: Two times a day (BID) | INTRAVENOUS | Status: DC

## 2021-08-28 MED ORDER — SODIUM CHLORIDE 0.9% FLUSH
3.0000 mL | Freq: Two times a day (BID) | INTRAVENOUS | Status: DC
  Administered 2021-08-28 – 2021-09-07 (×13): 3 mL via INTRAVENOUS

## 2021-08-28 MED ORDER — LIDOCAINE HCL (PF) 1 % IJ SOLN
INTRAMUSCULAR | Status: DC | PRN
  Administered 2021-08-28: 5 mL via INTRADERMAL

## 2021-08-28 MED ORDER — MAGNESIUM SULFATE 2 GM/50ML IV SOLN
2.0000 g | Freq: Once | INTRAVENOUS | Status: AC
  Administered 2021-08-28: 2 g via INTRAVENOUS
  Filled 2021-08-28: qty 50

## 2021-08-28 MED ORDER — HEPARIN (PORCINE) IN NACL 1000-0.9 UT/500ML-% IV SOLN
INTRAVENOUS | Status: AC
  Filled 2021-08-28: qty 500

## 2021-08-28 MED ORDER — SODIUM CHLORIDE 0.9% FLUSH: 3.0000 mL | INTRAVENOUS | Status: DC | PRN

## 2021-08-28 MED ORDER — SODIUM CHLORIDE 0.9 % IV SOLN: INTRAVENOUS | Status: DC

## 2021-08-28 MED ORDER — ONDANSETRON HCL 4 MG/2ML IJ SOLN: 4.0000 mg | Freq: Four times a day (QID) | INTRAMUSCULAR | Status: DC | PRN

## 2021-08-28 MED ORDER — SODIUM CHLORIDE 0.9 % IV SOLN: 250.0000 mL | INTRAVENOUS | Status: DC | PRN

## 2021-08-28 MED ORDER — DIPHENOXYLATE-ATROPINE 2.5-0.025 MG PO TABS
2.0000 | ORAL_TABLET | Freq: Once | ORAL | Status: AC
  Administered 2021-08-28: 2 via ORAL
  Filled 2021-08-28: qty 2

## 2021-08-28 MED ORDER — ACETAMINOPHEN 325 MG PO TABS: 650.0000 mg | ORAL_TABLET | ORAL | Status: DC | PRN

## 2021-08-28 MED ORDER — MILRINONE LACTATE IN DEXTROSE 20-5 MG/100ML-% IV SOLN
0.1250 ug/kg/min | INTRAVENOUS | Status: DC
  Administered 2021-08-28 – 2021-09-02 (×5): 0.125 ug/kg/min via INTRAVENOUS
  Filled 2021-08-28 (×5): qty 100

## 2021-08-28 MED ORDER — CHLORHEXIDINE GLUCONATE CLOTH 2 % EX PADS
6.0000 | MEDICATED_PAD | Freq: Every day | CUTANEOUS | Status: DC
  Administered 2021-08-29 – 2021-09-07 (×10): 6 via TOPICAL

## 2021-08-28 MED ORDER — LABETALOL HCL 5 MG/ML IV SOLN: 10.0000 mg | INTRAVENOUS | Status: AC | PRN

## 2021-08-28 MED ORDER — SODIUM CHLORIDE 0.9% FLUSH
3.0000 mL | INTRAVENOUS | Status: DC | PRN
  Administered 2021-09-04 – 2021-09-07 (×3): 3 mL via INTRAVENOUS

## 2021-08-28 MED ORDER — LIDOCAINE HCL (PF) 1 % IJ SOLN
INTRAMUSCULAR | Status: AC
  Filled 2021-08-28: qty 30

## 2021-08-28 SURGICAL SUPPLY — 8 items
CATH SWAN GANZ 7F STRAIGHT (CATHETERS) ×1 IMPLANT
KIT CV 3L 7FR 20CM SULFAFREE (SET/KITS/TRAYS/PACK) ×1 IMPLANT
PACK CARDIAC CATHETERIZATION (CUSTOM PROCEDURE TRAY) ×3 IMPLANT
SHEATH PINNACLE 7F 10CM (SHEATH) ×1 IMPLANT
SLEEVE REPOSITIONING LENGTH 30 (MISCELLANEOUS) ×1 IMPLANT
TRANSDUCER W/STOPCOCK (MISCELLANEOUS) ×3 IMPLANT
TUBING ART PRESS 72  MALE/FEM (TUBING) ×1
TUBING ART PRESS 72 MALE/FEM (TUBING) IMPLANT

## 2021-08-28 NOTE — Progress Notes (Signed)
Heart Failure Nurse Navigator Progress Note ? ?No plans for HV TOC d/t declining kidney function with possible need for HD this hospitalization. Renal following.  ? ?HF Navigation team will sign-off.  ? ?Please re-consult if significant improvement in renal function to tolerate GDMT.  ? ?Pricilla Holm, MSN, RN ?Heart Failure Nurse Navigator ?[phone] ? ?

## 2021-08-28 NOTE — H&P (View-Only) (Signed)
? ?Progress Note ? ?Patient Name: Anthony Foster ?Date of Encounter: 08/28/2021 ? ?[facility] Cardiologist: Kirk Ruths, MD  ? ?Subjective  ? ?Feeling OK.  Continues to have loose stools each AM.  Notes htat his volume removal is slow.  ? ?Inpatient Medications  ?  ?Scheduled Meds: ? apixaban  2.5 mg Oral BID  ? atorvastatin  80 mg Oral Daily  ? carvedilol  3.125 mg Oral Daily  ? ezetimibe  10 mg Oral Daily  ? midodrine  10 mg Oral BID WC  ? sodium chloride flush  3 mL Intravenous Q12H  ? ?Continuous Infusions: ? sodium chloride    ? sodium chloride    ? furosemide 160 mg (08/28/21 0543)  ? ?PRN Meds: ?sodium chloride, acetaminophen, ondansetron (ZOFRAN) IV, oxyCODONE-acetaminophen, sodium chloride flush  ? ?Vital Signs  ?  ?Vitals:  ? 08/27/21 1930 08/28/21 0455 08/28/21 0727 08/28/21 1027  ?BP: (!) 92/57 (!) 105/56 (!) 111/58 (!) 112/59  ?Pulse: 67 73 72 69  ?Resp: '18 18 19   '$ ?Temp: (!) 97.3 ?F (36.3 ?C) (!) 97.4 ?F (36.3 ?C) (!) 97.3 ?F (36.3 ?C) (!) 97.4 ?F (36.3 ?C)  ?TempSrc: Oral Oral Oral Oral  ?SpO2: 100% 100% 94%   ?Weight:  81.6 kg    ?Height:      ? ? ?Intake/Output Summary (Last 24 hours) at 08/28/2021 1109 ?Last data filed at 08/28/2021 0849 ?Gross per 24 hour  ?Intake 555.55 ml  ?Output 1425 ml  ?Net -869.45 ml  ? ?Last 3 Weights 08/28/2021 08/27/2021 08/26/2021  ?Weight (lbs) 180 lb 181 lb 4.8 oz 183 lb 3.2 oz  ?Weight (kg) 81.647 kg 82.237 kg 83.099 kg  ?   ? ?Telemetry  ?  ?Atrial fibrillation.  Rate 60s.  PVCs.  12 beats NSVT.  - Personally Reviewed ? ?ECG  ?  ?N/a - Personally Reviewed ? ?Physical Exam  ? ?VS:  BP (!) 112/59 (BP Location: Left Arm)   Pulse 69   Temp (!) 97.4 ?F (36.3 ?C) (Oral)   Resp 19   Ht '5\' 8"'$  (1.727 m)   Wt 81.6 kg   SpO2 94%   BMI 27.37 kg/m?  , BMI Body mass index is 27.37 kg/m?. ?GENERAL:  Well appearing.  Lying comfortably in bed.  ?HEENT: Pupils equal round and reactive, fundi not visualized, oral mucosa unremarkable ?NECK:  +jugular venous distention,  waveform within normal limits, carotid upstroke brisk and symmetric, no bruits, no thyromegaly ?LUNGS:  Clear to auscultation bilaterally ?HEART:  RRR.  PMI not displaced or sustained,S1 and S2 within normal limits, no S3, no S4, no clicks, no rubs, no murmurs ?ABD:  Flat, positive bowel sounds normal in frequency in pitch, no bruits, no rebound, no guarding, no midline pulsatile mass, no hepatomegaly, no splenomegaly ?EXT:  2 plus pulses throughout, 1+ LE edema to mid tibia bilaterally, no cyanosis no clubbing ?SKIN:  No rashes no nodules ?NEURO:  Cranial nerves II through XII grossly intact, motor grossly intact throughout ?PSYCH:  Cognitively intact, oriented to person place and time ? ? ?Labs  ?  ?High Sensitivity Troponin:   ?Recent Labs  ?Lab 08/24/21 ?1126 08/24/21 ?1455  ?TROPONINIHS 113* 105*  ?   ?Chemistry ?Recent Labs  ?Lab 08/24/21 ?0145 08/24/21 ?1191 08/24/21 ?1126 08/25/21 ?4782 08/26/21 ?0225 08/27/21 ?9562 08/28/21 ?1308  ?NA 141   < >  --    < > 139 138 138  ?K 4.9   < >  --    < >  4.6 5.1 4.7  ?CL 114*   < >  --    < > 110 108 107  ?CO2 18*  --   --    < > 20* 18* 18*  ?GLUCOSE 104*   < >  --    < > 93 86 96  ?BUN 51*   < >  --    < > 61* 64* 72*  ?CREATININE 3.76*   < >  --    < > 4.45* 4.63* 4.78*  ?CALCIUM 9.5  --   --    < > 9.0 9.1 9.2  ?MG  --   --  1.7  --   --  1.8  --   ?PROT 6.2*  --   --   --   --   --   --   ?ALBUMIN 3.4*  --   --   --   --   --   --   ?AST 17  --   --   --   --   --   --   ?ALT 13  --   --   --   --   --   --   ?ALKPHOS 50  --   --   --   --   --   --   ?BILITOT 0.9  --   --   --   --   --   --   ?GFRNONAA 15*  --   --    < > 13* 12* 11*  ?ANIONGAP 9  --   --    < > '9 12 13  '$ ? < > = values in this interval not displayed.  ?  ?Lipids No results for input(s): CHOL, TRIG, HDL, LABVLDL, LDLCALC, CHOLHDL in the last 168 hours.  ?Hematology ?Recent Labs  ?Lab 08/24/21 ?0145 08/24/21 ?0318 08/27/21 ?9924  ?WBC 9.6  --  10.2  ?RBC 5.05  --  4.90  ?HGB 13.7 15.6 13.3  ?HCT  44.2 46.0 40.8  ?MCV 87.5  --  83.3  ?MCH 27.1  --  27.1  ?MCHC 31.0  --  32.6  ?RDW 16.1*  --  15.9*  ?PLT 130*  --  124*  ? ?Thyroid  ?Recent Labs  ?Lab 08/24/21 ?2683  ?TSH 2.135  ?  ?BNP ?Recent Labs  ?Lab 08/24/21 ?0145  ?BNP 3,914.4*  ?  ?DDimer No results for input(s): DDIMER in the last 168 hours.  ? ?Radiology  ?  ?No results found. ? ?Cardiac Studies  ? ?Echo 08/25/21: ? 1. Left ventricular ejection fraction, by estimation, is 20 to 25%. The  ?left ventricle has severely decreased function. The left ventricle  ?demonstrates global hypokinesis. The left ventricular internal cavity size  ?was moderately to severely dilated.  ?Left ventricular diastolic function could not be evaluated.  ? 2. Right ventricular systolic function is low normal. The right  ?ventricular size is mildly enlarged. There is moderately elevated  ?pulmonary artery systolic pressure.  ? 3. Left atrial size was moderately dilated.  ? 4. Right atrial size was severely dilated.  ? 5. The mitral valve is grossly normal. Mild mitral valve regurgitation.  ? 6. Tricuspid valve regurgitation is moderate.  ? 7. The aortic valve is tricuspid. Aortic valve regurgitation is not  ?visualized.  ? ?Patient Profile  ?   ?81 y.o. 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  ? ?Assessment & Plan  ?  ?#  Acute on chronic systolic and diastolic heart failure:  ?LVEF 20-25% .  Patient notes that he is not taking his medications as prescribed for quite some time prior to admission.  He continues to slowly diurese.  Symptoms much better but he remains volume overloaded.  Renal function is worsening with diuresis though he is not yet euvolemic.  I am concerned that if we keep pushing will significantly worsen his renal function.  He understands the importance of taking his meds.  He is on midodrine for BP support.  BP too low for GDMT.  We will get  a right heart cath today to better understand his volume status and asked the advanced heart failure team to see him.  Given his nonadherence I think home inotropes would be a challenge.  However he may need additional blood pressure support while in the hospital to allow for diuresis without further affecting his renal function.  Continue carvedilol and midodrine for now.  Renal function will not allow for digoxin. ? ?Risks and benefits of cardiac catheterization have been discussed with the patient.  The patient understands that risks included but are not limited to stroke (1 in 1000), death (1 in 74), bleeding (1 in 200).  The patient understands and agrees to proceed.  ? ?#Acute on chronic renal failure: ?Renal function continues to worsen with diuresis as above.  Nephrology is following.  Right heart cath as above. ? ?# Atrial fibrillation:  ?Rate stable on carvedilol.  Continue Eliquis. ? ?# Aortic aneurysm:  ?BP is low and he is on a beta-blocker.  Continue surveillance as outpatient.  ? ?# Tobacco abuse:  ?Continue to support working on cessation.  Continue beta blocker.  ? ?   ? ?For questions or updates, please contact [place] ?Please consult [email or web address] for contact info under  ? ?  ?   ?Signed, ?Skeet Latch, MD  ?08/28/2021, 11:09 AM   ? ?

## 2021-08-28 NOTE — Progress Notes (Signed)
PROGRESS NOTE    Anthony Foster  TGY:[id number] DOB: 1941-04-17 DOA: 08/24/2021 PCP: Wenda Low, MD  Brief Narrative:80/M with history of chronic combined systolic and diastolic CHF, EF 28-76%, grade 2 diastolic dysfunction, paroxysmal A-fib, history of AICD, COPD,, CKD 4 with hemodialysis fistula in place, AAA presented to the ED with progressive edema for the last 3 days, starting in his legs extending up to his belly including scrotum.  -In the ED chest x-ray noted small pleural effusions, UA with hematuria, CT abdomen noted cardiomegaly, anasarca,, moderate layering pleural effusions, small volume ascites and generalized body edema, hydroceles and infrarenal AAA. -Labs noted creatinine of 3.9, bicarb of 18, BNP 3914 -Cardiology, nephrology following, started on high-dose diuretics -Slow, poor response to diuretics, creatinine slowly worsening    Subjective: -Feels "so-so ", breathing a little better, denies nausea or vomiting   Assessment and Plan:  * Acute on chronic combined systolic and diastolic CHF (congestive heart failure) (HCC) Progressive CKD 4 -Last echo from 2017, noted EF of 35-40% with grade 2 diastolic dysfunction, repeat echo with EF down to 20-25% -Appreciate nephrology input, started on Lasix 160 mg TID, very slow response to diuretics so far, 3 L negative in last 3 days -Carvedilol dose decreased, continue midodrine -Concern for low output state, discussed with cardiology, plan for possible right heart cath, evaluate for inotropes -No indications for dialysis yet, monitor urine output, BMP daily  CRI (chronic renal insufficiency), stage 4 (severe) (HCC) -Baseline creatinine around 3.5, followed by [facility], has a AV fistula for a few years  -See discussion above  PAF (paroxysmal atrial fibrillation) ([place]) on chronic anticoagulation - Rate controlled continue Eliquis and Coreg  AAA (abdominal aortic aneurysm) Patient has a known AAA  last evaluated by ultrasound 05/2021 noted to be 4 cm at that time.  Thrombocytopenia (HCC) -Mild, monitor   Essential hypertension - Soft but stable, continue midodrine, -Imdur and hydralazine discontinued -Coreg dose decreased  ICD (implantable cardioverter-defibrillator) in place -s/p ICD for ischemic cardiomyopathy   DVT prophylaxis: Eliquis Code Status: Full code Family Communication: Discussed with patient in detail, no family at bedside Disposition Plan: Home pending improvement in volume status   Consultants:  Cards, nephrology    Procedures:   Antimicrobials:    Objective: Vitals:   08/27/21 1930 08/28/21 0455 08/28/21 0727 08/28/21 1027  BP: (!) 92/57 (!) 105/56 (!) 111/58 (!) 112/59  Pulse: 67 73 72 69  Resp: '18 18 19   '$ Temp: (!) 97.3 F (36.3 C) (!) 97.4 F (36.3 C) (!) 97.3 F (36.3 C) (!) 97.4 F (36.3 C)  TempSrc: Oral Oral Oral Oral  SpO2: 100% 100% 94%   Weight:  81.6 kg    Height:        Intake/Output Summary (Last 24 hours) at 08/28/2021 1128 Last data filed at 08/28/2021 0849 Gross per 24 hour  Intake 555.55 ml  Output 1425 ml  Net -869.45 ml   Filed Weights   08/26/21 0427 08/27/21 0301 08/28/21 0455  Weight: 83.1 kg 82.2 kg 81.6 kg    Examination:  General exam: Chronically ill male sitting up in bed, AAOx3, no distress HEENT: Positive JVD CVS: S1-S2, regular rhythm Lungs: Decreased breath sounds to bases Abdomen: Soft, nontender, bowel sounds present, lateral abdominal wall edema Extremities: 1-2+ edema Skin: No rashes Psychiatry:  Mood & affect appropriate.     Data Reviewed:   CBC: Recent Labs  Lab 08/24/21 0145 08/24/21 0318 08/27/21 0357  WBC 9.6  --  10.2  NEUTROABS 6.8  --   --   HGB 13.7 15.6 13.3  HCT 44.2 46.0 40.8  MCV 87.5  --  83.3  PLT 130*  --  269*   Basic Metabolic Panel: Recent Labs  Lab 08/24/21 0145 08/24/21 0318 08/24/21 1126 08/25/21 0328 08/26/21 0225 08/27/21 0357 08/28/21 0322   NA 141 143  --  137 139 138 138  K 4.9 5.0  --  4.6 4.6 5.1 4.7  CL 114* 114*  --  110 110 108 107  CO2 18*  --   --  19* 20* 18* 18*  GLUCOSE 104* 98  --  137* 93 86 96  BUN 51* 57*  --  57* 61* 64* 72*  CREATININE 3.76* 3.90*  --  4.07* 4.45* 4.63* 4.78*  CALCIUM 9.5  --   --  8.9 9.0 9.1 9.2  MG  --   --  1.7  --   --  1.8  --    GFR: Estimated Creatinine Clearance: 11.5 mL/min (A) (by C-G formula based on SCr of 4.78 mg/dL (H)). Liver Function Tests: Recent Labs  Lab 08/24/21 0145  AST 17  ALT 13  ALKPHOS 50  BILITOT 0.9  PROT 6.2*  ALBUMIN 3.4*   No results for input(s): LIPASE, AMYLASE in the last 168 hours. No results for input(s): AMMONIA in the last 168 hours. Coagulation Profile: No results for input(s): INR, PROTIME in the last 168 hours. Cardiac Enzymes: No results for input(s): CKTOTAL, CKMB, CKMBINDEX, TROPONINI in the last 168 hours. BNP (last 3 results) No results for input(s): PROBNP in the last 8760 hours. HbA1C: No results for input(s): HGBA1C in the last 72 hours. CBG: No results for input(s): GLUCAP in the last 168 hours. Lipid Profile: No results for input(s): CHOL, HDL, LDLCALC, TRIG, CHOLHDL, LDLDIRECT in the last 72 hours. Thyroid Function Tests: No results for input(s): TSH, T4TOTAL, FREET4, T3FREE, THYROIDAB in the last 72 hours.  Anemia Panel: No results for input(s): VITAMINB12, FOLATE, FERRITIN, TIBC, IRON, RETICCTPCT in the last 72 hours. Urine analysis:    Component Value Date/Time   COLORURINE AMBER (A) 08/24/2021 0404   APPEARANCEUR HAZY (A) 08/24/2021 0404   LABSPEC 1.018 08/24/2021 0404   PHURINE 5.0 08/24/2021 0404   GLUCOSEU NEGATIVE 08/24/2021 0404   HGBUR LARGE (A) 08/24/2021 0404   BILIRUBINUR NEGATIVE 08/24/2021 0404   KETONESUR NEGATIVE 08/24/2021 0404   PROTEINUR 100 (A) 08/24/2021 0404   NITRITE NEGATIVE 08/24/2021 0404   LEUKOCYTESUR NEGATIVE 08/24/2021 0404   Sepsis  Labs: '@LABRCNTIP'$ (procalcitonin:4,lacticidven:4)  ) Recent Results (from the past 240 hour(s))  Resp Panel by RT-PCR (Flu A&B, Covid) Nasopharyngeal Swab     Status: None   Collection Time: 08/24/21  6:07 AM   Specimen: Nasopharyngeal Swab; Nasopharyngeal(NP) swabs in vial transport medium  Result Value Ref Range Status   SARS Coronavirus 2 by RT PCR NEGATIVE NEGATIVE Final    Comment: (NOTE) SARS-CoV-2 target nucleic acids are NOT DETECTED.  The SARS-CoV-2 RNA is generally detectable in upper respiratory specimens during the acute phase of infection. The lowest concentration of SARS-CoV-2 viral copies this assay can detect is 138 copies/mL. A negative result does not preclude SARS-Cov-2 infection and should not be used as the sole basis for treatment or other patient management decisions. A negative result may occur with  improper specimen collection/handling, submission of specimen other than nasopharyngeal swab, presence of viral mutation(s) within the areas targeted by this assay, and inadequate number of viral copies(<138  copies/mL). A negative result must be combined with clinical observations, patient history, and epidemiological information. The expected result is Negative.  Fact Sheet for Patients:  EntrepreneurPulse.com.au  Fact Sheet for Healthcare Providers:  IncredibleEmployment.be  This test is no t yet approved or cleared by the [place] FDA and  has been authorized for detection and/or diagnosis of SARS-CoV-2 by FDA under an Emergency Use Authorization (EUA). This EUA will remain  in effect (meaning this test can be used) for the duration of the COVID-19 declaration under Section 564(b)(1) of the Act, 21 U.S.C.section 360bbb-3(b)(1), unless the authorization is terminated  or revoked sooner.       Influenza A by PCR NEGATIVE NEGATIVE Final   Influenza B by PCR NEGATIVE NEGATIVE Final    Comment: (NOTE) The Xpert  Xpress SARS-CoV-2/FLU/RSV plus assay is intended as an aid in the diagnosis of influenza from Nasopharyngeal swab specimens and should not be used as a sole basis for treatment. Nasal washings and aspirates are unacceptable for Xpert Xpress SARS-CoV-2/FLU/RSV testing.  Fact Sheet for Patients: EntrepreneurPulse.com.au  Fact Sheet for Healthcare Providers: IncredibleEmployment.be  This test is not yet approved or cleared by the [place] FDA and has been authorized for detection and/or diagnosis of SARS-CoV-2 by FDA under an Emergency Use Authorization (EUA). This EUA will remain in effect (meaning this test can be used) for the duration of the COVID-19 declaration under Section 564(b)(1) of the Act, 21 U.S.C. section 360bbb-3(b)(1), unless the authorization is terminated or revoked.  Performed at [facility], [place] [address] [place], [place] [address]      Radiology Studies: No results found.   Scheduled Meds:  apixaban  2.5 mg Oral BID   atorvastatin  80 mg Oral Daily   carvedilol  3.125 mg Oral Daily   ezetimibe  10 mg Oral Daily   midodrine  10 mg Oral BID WC   sodium chloride flush  3 mL Intravenous Q12H   Continuous Infusions:  sodium chloride     sodium chloride     furosemide 160 mg (08/28/21 0543)     LOS: 4 days    Time spent: 32mn  PDomenic Polite MD [facility]   08/28/2021, 11:28 AM

## 2021-08-28 NOTE — TOC Initial Note (Addendum)
Transition of Care (TOC) - Initial/Assessment Note  ? ? ?Patient Details  ?Name: Anthony Foster ?MRN: [id number] ?Date of Birth: 1941/01/10 ? ?Transition of Care [facility]) CM/SW Contact:    ?Marcheta Grammes Rexene Alberts, RN ?Phone Number: [phone] ?08/28/2021, 5:43 PM ? ?Clinical Narrative:                 ?HF TOC CM spoke to pt at bedside. Lives in home with grandson. States he was independent prior to hospital stay. Drives to his appts. Pt will purchase a scale. States he usually eat one a day and eats out. Discussed ways to decrease sodium in diet. Offered choice for [facility]. Pt agreeable to [facility]. Contacted rep, Corene Cornea with new referral. Will continue to follow for dc needs.  ? ?Expected Discharge Plan: Home/Self Care ?Barriers to Discharge: Continued Medical Work up ? ? ?Patient Goals and CMS Choice ?Patient states their goals for this hospitalization and ongoing recovery are:: remain independent ?CMS Medicare.gov Compare Post Acute Care list provided to:: Patient ?Choice offered to / list presented to : Patient ? ?Expected Discharge Plan and Services ?Expected Discharge Plan: Home/Self Care ?  ?Discharge Planning Services: CM Consult ?  ?Living arrangements for the past 2 months: [place] ?                ?  ?  ?  ?  ?  ?  ?  ?  ?  ?  ? ?Prior Living Arrangements/Services ?Living arrangements for the past 2 months: [place] ?Lives with:: Adult Children ?Patient language and need for interpreter reviewed:: No ?Do you feel safe going back to the place where you live?: Yes      ?Need for Family Participation in Patient Care: No (Comment) ?Care giver support system in place?: No (comment) ?Current home services: DME ([facility]) ?Criminal Activity/Legal Involvement Pertinent to Current Situation/Hospitalization: No - Comment as needed ? ?Activities of Daily Living ?  ?  ? ?Permission Sought/Granted ?Permission sought to share information with : Case Manager, Family Supports,  PCP ?Permission granted to share information with : Yes, Verbal Permission Granted ? Share Information with NAME: Braylon Grenda ?   ? Permission granted to share info w Relationship: grandson ? Permission granted to share info w Contact Information: [phone] ? ?Emotional Assessment ?Appearance:: Appears younger than stated age ?Attitude/Demeanor/Rapport: Gracious ?Affect (typically observed): Accepting ?Orientation: : Oriented to Self, Oriented to Place, Oriented to  Time, Oriented to Situation ?  ?Psych Involvement: No (comment) ? ?Admission diagnosis:  Acute CHF (congestive heart failure) ([place]) [I50.9] ?Acute on chronic congestive heart failure, unspecified heart failure type ([place]) [I50.9] ?Patient Active Problem List  ? Diagnosis Date Noted  ? Acute on chronic combined systolic and diastolic CHF (congestive heart failure) ([place]) 08/24/2021  ? Thrombocytopenia ([place]) 08/24/2021  ? AAA (abdominal aortic aneurysm) 08/24/2021  ? Tobacco use 08/24/2021  ? Chest pain with moderate risk of acute coronary syndrome 01/14/2018  ? Chronic anticoagulation 01/14/2018  ? CRI (chronic renal insufficiency), stage 4 (severe) ([place]) 01/14/2018  ? Ischemic cardiomyopathy 01/14/2018  ? CAD S/P percutaneous coronary angioplasty 09/12/2015  ? Renal insufficiency 09/12/2015  ? Essential hypertension 09/12/2015  ? Hyperlipidemia 09/12/2015  ? CHF (congestive heart failure) ([place]) 07/07/2015  ? Right knee pain 07/07/2015  ? Pedal edema 07/07/2015  ? Fluid overload 07/07/2015  ? PAF (paroxysmal atrial fibrillation) ([place]) on chronic anticoagulation 07/07/2015  ? Gout 07/07/2015  ? ICD (implantable cardioverter-defibrillator) in place 07/07/2015  ? ?  PCP:  Wenda Low, MD ?Pharmacy:   ?[place] [place] ?[address] ?[place] [address] ?Phone: [phone] Fax: [phone] ? ?[facility] [phone] - [place], [place] ?[place] [address] ?Phone: [phone] Fax: [phone] ? ?[facility] ?[address] ?[place] [address] ?Phone: [phone] Fax: [phone] ? ? ? ? ?Social Determinants of Health (SDOH) Interventions ?  ? ?Readmission Risk Interventions ?No flowsheet data found. ? ? ?

## 2021-08-28 NOTE — Care Management Important Message (Signed)
Important Message ? ?Patient Details  ?Name: Anthony Foster ?MRN: [id number] ?Date of Birth: 1941-03-26 ? ? ?Medicare Important Message Given:  Yes ? ? ? ? ?[name] ?08/28/2021, 8:12 AM ?

## 2021-08-28 NOTE — Interval H&P Note (Signed)
History and Physical Interval Note: ? ?08/28/2021 ?12:17 PM ? ?Anthony Foster  has presented today for surgery, with the diagnosis of heart failure.  The various methods of treatment have been discussed with the patient and family. After consideration of risks, benefits and other options for treatment, the patient has consented to  Procedure(s): ?RIGHT HEART CATH (N/A) and central line placement as a surgical intervention.  The patient's history has been reviewed, patient examined, no change in status, stable for surgery.  I have reviewed the patient's chart and labs.  Questions were answered to the patient's satisfaction.   ? ? ?Michelangelo Rindfleisch ? ? ?

## 2021-08-28 NOTE — Progress Notes (Signed)
Heart Failure Navigator Progress Note ? ?Assessed for Heart & Vascular TOC clinic readiness.  ?Patient does not meet criteria due to AHF rounding team consulted this hospitalization.  ?  ? ?Pricilla Holm, MSN, RN ?Heart Failure Nurse Navigator ?[phone] ? ? ?

## 2021-08-28 NOTE — Progress Notes (Addendum)
? ?Progress Note ? ?Patient Name: Anthony Foster ?Date of Encounter: 08/28/2021 ? ?[facility] Cardiologist: Kirk Ruths, MD  ? ?Subjective  ? ?Feeling OK.  Continues to have loose stools each AM.  Notes htat his volume removal is slow.  ? ?Inpatient Medications  ?  ?Scheduled Meds: ? apixaban  2.5 mg Oral BID  ? atorvastatin  80 mg Oral Daily  ? carvedilol  3.125 mg Oral Daily  ? ezetimibe  10 mg Oral Daily  ? midodrine  10 mg Oral BID WC  ? sodium chloride flush  3 mL Intravenous Q12H  ? ?Continuous Infusions: ? sodium chloride    ? sodium chloride    ? furosemide 160 mg (08/28/21 0543)  ? ?PRN Meds: ?sodium chloride, acetaminophen, ondansetron (ZOFRAN) IV, oxyCODONE-acetaminophen, sodium chloride flush  ? ?Vital Signs  ?  ?Vitals:  ? 08/27/21 1930 08/28/21 0455 08/28/21 0727 08/28/21 1027  ?BP: (!) 92/57 (!) 105/56 (!) 111/58 (!) 112/59  ?Pulse: 67 73 72 69  ?Resp: '18 18 19   '$ ?Temp: (!) 97.3 ?F (36.3 ?C) (!) 97.4 ?F (36.3 ?C) (!) 97.3 ?F (36.3 ?C) (!) 97.4 ?F (36.3 ?C)  ?TempSrc: Oral Oral Oral Oral  ?SpO2: 100% 100% 94%   ?Weight:  81.6 kg    ?Height:      ? ? ?Intake/Output Summary (Last 24 hours) at 08/28/2021 1109 ?Last data filed at 08/28/2021 0849 ?Gross per 24 hour  ?Intake 555.55 ml  ?Output 1425 ml  ?Net -869.45 ml  ? ?Last 3 Weights 08/28/2021 08/27/2021 08/26/2021  ?Weight (lbs) 180 lb 181 lb 4.8 oz 183 lb 3.2 oz  ?Weight (kg) 81.647 kg 82.237 kg 83.099 kg  ?   ? ?Telemetry  ?  ?Atrial fibrillation.  Rate 60s.  PVCs.  12 beats NSVT.  - Personally Reviewed ? ?ECG  ?  ?N/a - Personally Reviewed ? ?Physical Exam  ? ?VS:  BP (!) 112/59 (BP Location: Left Arm)   Pulse 69   Temp (!) 97.4 ?F (36.3 ?C) (Oral)   Resp 19   Ht '5\' 8"'$  (1.727 m)   Wt 81.6 kg   SpO2 94%   BMI 27.37 kg/m?  , BMI Body mass index is 27.37 kg/m?. ?GENERAL:  Well appearing.  Lying comfortably in bed.  ?HEENT: Pupils equal round and reactive, fundi not visualized, oral mucosa unremarkable ?NECK:  +jugular venous distention,  waveform within normal limits, carotid upstroke brisk and symmetric, no bruits, no thyromegaly ?LUNGS:  Clear to auscultation bilaterally ?HEART:  RRR.  PMI not displaced or sustained,S1 and S2 within normal limits, no S3, no S4, no clicks, no rubs, no murmurs ?ABD:  Flat, positive bowel sounds normal in frequency in pitch, no bruits, no rebound, no guarding, no midline pulsatile mass, no hepatomegaly, no splenomegaly ?EXT:  2 plus pulses throughout, 1+ LE edema to mid tibia bilaterally, no cyanosis no clubbing ?SKIN:  No rashes no nodules ?NEURO:  Cranial nerves II through XII grossly intact, motor grossly intact throughout ?PSYCH:  Cognitively intact, oriented to person place and time ? ? ?Labs  ?  ?High Sensitivity Troponin:   ?Recent Labs  ?Lab 08/24/21 ?1126 08/24/21 ?1455  ?TROPONINIHS 113* 105*  ?   ?Chemistry ?Recent Labs  ?Lab 08/24/21 ?0145 08/24/21 ?4081 08/24/21 ?1126 08/25/21 ?4481 08/26/21 ?0225 08/27/21 ?8563 08/28/21 ?1497  ?NA 141   < >  --    < > 139 138 138  ?K 4.9   < >  --    < >  4.6 5.1 4.7  ?CL 114*   < >  --    < > 110 108 107  ?CO2 18*  --   --    < > 20* 18* 18*  ?GLUCOSE 104*   < >  --    < > 93 86 96  ?BUN 51*   < >  --    < > 61* 64* 72*  ?CREATININE 3.76*   < >  --    < > 4.45* 4.63* 4.78*  ?CALCIUM 9.5  --   --    < > 9.0 9.1 9.2  ?MG  --   --  1.7  --   --  1.8  --   ?PROT 6.2*  --   --   --   --   --   --   ?ALBUMIN 3.4*  --   --   --   --   --   --   ?AST 17  --   --   --   --   --   --   ?ALT 13  --   --   --   --   --   --   ?ALKPHOS 50  --   --   --   --   --   --   ?BILITOT 0.9  --   --   --   --   --   --   ?GFRNONAA 15*  --   --    < > 13* 12* 11*  ?ANIONGAP 9  --   --    < > '9 12 13  '$ ? < > = values in this interval not displayed.  ?  ?Lipids No results for input(s): CHOL, TRIG, HDL, LABVLDL, LDLCALC, CHOLHDL in the last 168 hours.  ?Hematology ?Recent Labs  ?Lab 08/24/21 ?0145 08/24/21 ?0318 08/27/21 ?2633  ?WBC 9.6  --  10.2  ?RBC 5.05  --  4.90  ?HGB 13.7 15.6 13.3  ?HCT  44.2 46.0 40.8  ?MCV 87.5  --  83.3  ?MCH 27.1  --  27.1  ?MCHC 31.0  --  32.6  ?RDW 16.1*  --  15.9*  ?PLT 130*  --  124*  ? ?Thyroid  ?Recent Labs  ?Lab 08/24/21 ?3545  ?TSH 2.135  ?  ?BNP ?Recent Labs  ?Lab 08/24/21 ?0145  ?BNP 3,914.4*  ?  ?DDimer No results for input(s): DDIMER in the last 168 hours.  ? ?Radiology  ?  ?No results found. ? ?Cardiac Studies  ? ?Echo 08/25/21: ? 1. Left ventricular ejection fraction, by estimation, is 20 to 25%. The  ?left ventricle has severely decreased function. The left ventricle  ?demonstrates global hypokinesis. The left ventricular internal cavity size  ?was moderately to severely dilated.  ?Left ventricular diastolic function could not be evaluated.  ? 2. Right ventricular systolic function is low normal. The right  ?ventricular size is mildly enlarged. There is moderately elevated  ?pulmonary artery systolic pressure.  ? 3. Left atrial size was moderately dilated.  ? 4. Right atrial size was severely dilated.  ? 5. The mitral valve is grossly normal. Mild mitral valve regurgitation.  ? 6. Tricuspid valve regurgitation is moderate.  ? 7. The aortic valve is tricuspid. Aortic valve regurgitation is not  ?visualized.  ? ?Patient Profile  ?   ?81 y.o. 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  ? ?Assessment & Plan  ?  ?#  Acute on chronic systolic and diastolic heart failure:  ?LVEF 20-25% .  Patient notes that he is not taking his medications as prescribed for quite some time prior to admission.  He continues to slowly diurese.  Symptoms much better but he remains volume overloaded.  Renal function is worsening with diuresis though he is not yet euvolemic.  I am concerned that if we keep pushing will significantly worsen his renal function.  He understands the importance of taking his meds.  He is on midodrine for BP support.  BP too low for GDMT.  We will get  a right heart cath today to better understand his volume status and asked the advanced heart failure team to see him.  Given his nonadherence I think home inotropes would be a challenge.  However he may need additional blood pressure support while in the hospital to allow for diuresis without further affecting his renal function.  Continue carvedilol and midodrine for now.  Renal function will not allow for digoxin. ? ?Risks and benefits of cardiac catheterization have been discussed with the patient.  The patient understands that risks included but are not limited to stroke (1 in 1000), death (1 in 44), bleeding (1 in 200).  The patient understands and agrees to proceed.  ? ?#Acute on chronic renal failure: ?Renal function continues to worsen with diuresis as above.  Nephrology is following.  Right heart cath as above. ? ?# Atrial fibrillation:  ?Rate stable on carvedilol.  Continue Eliquis. ? ?# Aortic aneurysm:  ?BP is low and he is on a beta-blocker.  Continue surveillance as outpatient.  ? ?# Tobacco abuse:  ?Continue to support working on cessation.  Continue beta blocker.  ? ?   ? ?For questions or updates, please contact [facility] ?Please consult [email or web address] for contact info under  ? ?  ?   ?Signed, ?Skeet Latch, MD  ?08/28/2021, 11:09 AM   ? ?

## 2021-08-28 NOTE — Progress Notes (Signed)
[facility] ?ROUNDING NOTE  ? ?Subjective:  ? 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  History of paroxysmal atrial fibrillation and AICD placement.  History of COPD.  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  Creatinine 3.9 mg/dL when last noted.  Was evaluated for progression of renal disease.  Recommendations to continue diuretics. ? ?Blood pressure 111/58 pulse 73 temperature 97.3 O2 sats 94% room air ? ?Sodium 138 potassium 4.7 chloride 107 CO2 18 BUN 72 creatinine 4.78 glucose 96 calcium 9.2 hemoglobin 13.3 ? ?Objective:  ?Vital signs in last 24 hours:  ?Temp:  [97.3 ?F (36.3 ?C)-98.1 ?F (36.7 ?C)] 97.3 ?F (36.3 ?C) (03/07 8315) ?Pulse Rate:  [67-75] 72 (03/07 0727) ?Resp:  [18-19] 19 (03/07 0727) ?BP: (92-111)/(46-67) 111/58 (03/07 0727) ?SpO2:  [94 %-100 %] 94 % (03/07 0727) ?Weight:  [81.6 kg] 81.6 kg (03/07 0455) ? ?Weight change: -0.59 kg ?Filed Weights  ? 08/26/21 0427 08/27/21 0301 08/28/21 0455  ?Weight: 83.1 kg 82.2 kg 81.6 kg  ? ? ?Intake/Output: ?I/O last 3 completed shifts: ?In: 176 [P.O.:570.9; IV Piggyback:314.1] ?Out: 2325 [Urine:2325] ?  ?Intake/Output this shift: ? No intake/output data recorded. ? ?CVS- RRR ?RS- CTA ?ABD- BS present soft non-distended ?EXT-3+ lower extremity edema ? ? ?Basic Metabolic Panel: ?Recent Labs  ?Lab 08/24/21 ?0145 08/24/21 ?1607 08/24/21 ?1126 08/25/21 ?3710 08/26/21 ?0225 08/27/21 ?6269 08/28/21 ?4854  ?NA 141 143  --  137 139 138 138  ?K 4.9 5.0  --  4.6 4.6 5.1 4.7  ?CL 114* 114*  --  110 110 108 107  ?CO2 18*  --   --  19* 20* 18* 18*  ?GLUCOSE 104* 98  --  137* 93 86 96  ?BUN 51* 57*  --  57* 61* 64* 72*  ?CREATININE 3.76* 3.90*  --  4.07* 4.45* 4.63* 4.78*  ?CALCIUM 9.5  --   --  8.9 9.0 9.1 9.2  ?MG  --   --  1.7  --   --  1.8  --   ? ? ? ?Liver Function Tests: ?Recent Labs   ?Lab 08/24/21 ?0145  ?AST 17  ?ALT 13  ?ALKPHOS 50  ?BILITOT 0.9  ?PROT 6.2*  ?ALBUMIN 3.4*  ? ? ?No results for input(s): LIPASE, AMYLASE in the last 168 hours. ?No results for input(s): AMMONIA in the last 168 hours. ? ?CBC: ?Recent Labs  ?Lab 08/24/21 ?0145 08/24/21 ?0318 08/27/21 ?6270  ?WBC 9.6  --  10.2  ?NEUTROABS 6.8  --   --   ?HGB 13.7 15.6 13.3  ?HCT 44.2 46.0 40.8  ?MCV 87.5  --  83.3  ?PLT 130*  --  124*  ? ? ? ?Cardiac Enzymes: ?No results for input(s): CKTOTAL, CKMB, CKMBINDEX, TROPONINI in the last 168 hours. ? ?BNP: ?Invalid input(s): POCBNP ? ?CBG: ?No results for input(s): GLUCAP in the last 168 hours. ? ?Microbiology: ?Results for orders placed or performed during the hospital encounter of 08/24/21  ?Resp Panel by RT-PCR (Flu A&B, Covid) Nasopharyngeal Swab     Status: None  ? Collection Time: 08/24/21  6:07 AM  ? Specimen: Nasopharyngeal Swab; Nasopharyngeal(NP) swabs in vial transport medium  ?Result Value Ref Range Status  ? SARS Coronavirus 2 by RT PCR NEGATIVE NEGATIVE Final  ?  Comment: (NOTE) ?SARS-CoV-2 target nucleic acids are NOT DETECTED. ? ?The SARS-CoV-2 RNA  is generally detectable in upper respiratory ?specimens during the acute phase of infection. The lowest ?concentration of SARS-CoV-2 viral copies this assay can detect is ?138 copies/mL. A negative result does not preclude SARS-Cov-2 ?infection and should not be used as the sole basis for treatment or ?other patient management decisions. A negative result may occur with  ?improper specimen collection/handling, submission of specimen other ?than nasopharyngeal swab, presence of viral mutation(s) within the ?areas targeted by this assay, and inadequate number of viral ?copies(<138 copies/mL). A negative result must be combined with ?clinical observations, patient history, and epidemiological ?information. The expected result is Negative. ? ?Fact Sheet for Patients:  ?EntrepreneurPulse.com.au ? ?Fact Sheet for  Healthcare Providers:  ?IncredibleEmployment.be ? ?This test is no t yet approved or cleared by the [place] FDA and  ?has been authorized for detection and/or diagnosis of SARS-CoV-2 by ?FDA under an Emergency Use Authorization (EUA). This EUA will remain  ?in effect (meaning this test can be used) for the duration of the ?COVID-19 declaration under Section 564(b)(1) of the Act, 21 ?U.S.C.section 360bbb-3(b)(1), unless the authorization is terminated  ?or revoked sooner.  ? ? ?  ? Influenza A by PCR NEGATIVE NEGATIVE Final  ? Influenza B by PCR NEGATIVE NEGATIVE Final  ?  Comment: (NOTE) ?The Xpert Xpress SARS-CoV-2/FLU/RSV plus assay is intended as an aid ?in the diagnosis of influenza from Nasopharyngeal swab specimens and ?should not be used as a sole basis for treatment. Nasal washings and ?aspirates are unacceptable for Xpert Xpress SARS-CoV-2/FLU/RSV ?testing. ? ?Fact Sheet for Patients: ?EntrepreneurPulse.com.au ? ?Fact Sheet for Healthcare Providers: ?IncredibleEmployment.be ? ?This test is not yet approved or cleared by the [place] FDA and ?has been authorized for detection and/or diagnosis of SARS-CoV-2 by ?FDA under an Emergency Use Authorization (EUA). This EUA will remain ?in effect (meaning this test can be used) for the duration of the ?COVID-19 declaration under Section 564(b)(1) of the Act, 21 U.S.C. ?section 360bbb-3(b)(1), unless the authorization is terminated or ?revoked. ? ?Performed at [facility], [place] [address], [place], [place] ?[address] ?  ? ? ?Coagulation Studies: ?No results for input(s): LABPROT, INR in the last 72 hours. ? ?Urinalysis: ?No results for input(s): COLORURINE, LABSPEC, [place], GLUCOSEU, HGBUR, BILIRUBINUR, KETONESUR, PROTEINUR, UROBILINOGEN, NITRITE, LEUKOCYTESUR in the last 72 hours. ? ?Invalid input(s): APPERANCEUR  ? ? ?Imaging: ?No results found. ? ? ?Medications:  ? ? sodium chloride    ?  furosemide 160 mg (08/28/21 0543)  ? ? apixaban  2.5 mg Oral BID  ? atorvastatin  80 mg Oral Daily  ? carvedilol  3.125 mg Oral Daily  ? ezetimibe  10 mg Oral Daily  ? midodrine  10 mg Oral BID WC  ? sodium chloride flush  3 mL Intravenous Q12H  ? ?sodium chloride, acetaminophen, ondansetron (ZOFRAN) IV, oxyCODONE-acetaminophen, sodium chloride flush ? ?Assessment/ Plan:  ?Chronic kidney disease stage IV/V.  Does not appear to be in any need of urgent dialysis at this point. ?ANEMIA-stable. ?MBD-stable ?HTN/VOL-requires midodrine.  Continue IV Lasix for diuresis.  Responding with 1.7 L diuresis 08/27/2021 ?ACCESS-has AV fistula in place. ? ? ? LOS: 4 ?Sherril Croon ?'@TODAY''@7'$ :41 AM ?  ?

## 2021-08-28 NOTE — Consult Note (Addendum)
Advanced Heart Failure Team Consult Note   Primary Physician: Wenda Low, MD PCP-Cardiologist:  Kirk Ruths, MD  Reason for Consultation: Acute on chronic systolic CHF  HPI:    Anthony Foster is seen today for evaluation of acute on chronic systolic CHF at the request of Dr. Oval Linsey with Cardiology. 81 y.o. 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   He follows with Dr. Stanford Breed in outpatient setting. EF 35-40% in 2014 and 2017.   Admitted on 03/03 with acute on chronic systolic CHF. Also noted to be back in AF with controlled rate. Had stopped taking all of his medications in November 2022 d/t concern they were causing diarrhea and restarted them 2-3 weeks ago. Scr up to 3.90 (baseline 3.2). CT A/P w/o contrast with evidence of anasarca, b/l pleural effusions, ankylosed SI joints, b/l scrotal hydroceles. Started on IV lasix 120 mg BID, subsequently has been increased to 160 mg TID. Renal function has worsening with attempts to diurese. Scr has trended up to 4.78 today. Weight down 4 lb total. UOP suoptimal despite high doses of diuretics. Has required addition of midodrine for SBP support. Unable to add GDMT. Growing concern for possible low-output HF. Echo this admit with EF 20-25%, RV function low normal, RVSP 50 mmHg, mild MR, moderate TR. Going for RHC today to assess filling pressures and CO.    Review of Systems: [y] = yes, '[ ]'$  = no   General: Weight gain '[ ]'$ ; Weight loss '[ ]'$ ; Anorexia '[ ]'$ ; Fatigue '[ ]'$ ; Fever '[ ]'$ ; Chills '[ ]'$ ; Weakness '[ ]'$   Cardiac: Chest pain/pressure '[ ]'$ ; Resting SOB '[ ]'$ ; Exertional SOB [Y]; Orthopnea '[ ]'$ ; Pedal Edema [Y]; Palpitations '[ ]'$ ; Syncope '[ ]'$ ; Presyncope '[ ]'$ ; Paroxysmal nocturnal dyspnea'[ ]'$   Pulmonary: Cough '[ ]'$ ; Wheezing'[ ]'$ ; Hemoptysis'[ ]'$ ; Sputum '[ ]'$ ; Snoring '[ ]'$   GI: Vomiting'[ ]'$ ; Dysphagia'[ ]'$ ; Melena[  ]; Hematochezia '[ ]'$ ; Heartburn'[ ]'$ ; Abdominal pain '[ ]'$ ; Constipation '[ ]'$ ; Diarrhea [Y]; BRBPR '[ ]'$   GU: Hematuria'[ ]'$ ; Dysuria '[ ]'$ ; Nocturia'[ ]'$   Vascular: Pain in legs with walking '[ ]'$ ; Pain in feet with lying flat '[ ]'$ ; Non-healing sores '[ ]'$ ; Stroke '[ ]'$ ; TIA '[ ]'$ ; Slurred speech '[ ]'$ ;  Neuro: Headaches'[ ]'$ ; Vertigo'[ ]'$ ; Seizures'[ ]'$ ; Paresthesias'[ ]'$ ;Blurred vision '[ ]'$ ; Diplopia '[ ]'$ ; Vision changes '[ ]'$   Ortho/Skin: Arthritis '[ ]'$ ; Joint pain '[ ]'$ ; Muscle pain '[ ]'$ ; Joint swelling '[ ]'$ ; Back Pain '[ ]'$ ; Rash '[ ]'$   Psych: Depression'[ ]'$ ; Anxiety'[ ]'$   Heme: Bleeding problems '[ ]'$ ; Clotting disorders '[ ]'$ ; Anemia '[ ]'$   Endocrine: Diabetes '[ ]'$ ; Thyroid dysfunction'[ ]'$   Home Medications Prior to Admission medications   Medication Sig Start Date End Date Taking? Authorizing Provider  allopurinol (ZYLOPRIM) 300 MG tablet Take 300 mg by mouth daily. 07/26/16  Yes [provider]  apixaban (ELIQUIS) 2.5 MG TABS tablet Take 1 tablet (2.5 mg total) by mouth daily. 06/10/19  Yes Lelon Perla, MD  atorvastatin (LIPITOR) 80 MG tablet Take 1 tablet (80 mg total) by mouth daily. 09/12/15  Yes Lelon Perla, MD  carvedilol (COREG) 25 MG tablet Take 25 mg by mouth daily.  09/21/16  Yes [provider]  ezetimibe (ZETIA) 10 MG tablet Take 1 tablet (10 mg total) by mouth daily. 06/08/21 09/06/21 Yes Kirk Ruths  S, MD  furosemide (LASIX) 40 MG tablet Take 40 mg by mouth daily. 07/21/16  Yes [provider]  hydrALAZINE (APRESOLINE) 25 MG tablet Take 1 tablet (25 mg total) by mouth 3 (three) times daily. 09/12/15  Yes Lelon Perla, MD  isosorbide mononitrate (IMDUR) 60 MG 24 hr tablet Take 1 tablet (60 mg total) by mouth daily. 01/22/18  Yes Kilroy, Luke K, PA-C  nitroGLYCERIN (NITROSTAT) 0.4 MG SL tablet Place 1 tablet (0.4 mg total) under the tongue every 5 (five) minutes as needed for chest pain. 01/14/18 08/24/21 Yes Kilroy, Doreene Burke, PA-C  oxyCODONE-acetaminophen (PERCOCET/ROXICET) 5-325 MG tablet Take 1  tablet by mouth every 6 (six) hours as needed. 11/26/18  Yes Laurence Slate M, PA-C  potassium chloride (KLOR-CON M) 10 MEQ tablet Take 10 mEq by mouth daily. 08/01/21  Yes [provider]  PROAIR RESPICLICK 854 (90 Base) MCG/ACT AEPB Inhale 1 puff into the lungs every 4 (four) hours as needed (shortness of breath). 08/02/21  Yes [provider]    Past Medical History: Past Medical History:  Diagnosis Date   AAA (abdominal aortic aneurysm)    3.5 cm 04/2018   Atrial fibrillation (HCC)    CHF (congestive heart failure) (HCC)    Coronary artery disease    Prior stent   Degenerative joint disease    Full dentures    Gout    Hypercholesteremia    Hypertension    ICD (implantable cardioverter-defibrillator) battery depletion    Ischemic cardiomyopathy    Pneumonia    Renal insufficiency    Wears glasses     Past Surgical History: Past Surgical History:  Procedure Laterality Date   AV FISTULA PLACEMENT Right 11/26/2018   Procedure:     Right arm Brachiocephalic Fistula Creation   ;  Surgeon: Waynetta Sandy, MD;  Location: [facility];  Service: Vascular;  Laterality: Right;   BACK SURGERY     CARDIAC SURGERY     CATARACT EXTRACTION W/ INTRAOCULAR LENS  IMPLANT, BILATERAL     COLONOSCOPY W/ BIOPSIES AND POLYPECTOMY     MASS EXCISION     neck   MULTIPLE TOOTH EXTRACTIONS     PACEMAKER PLACEMENT     SHOULDER SURGERY     left   TOTAL SHOULDER REPLACEMENT     WRIST SURGERY      Family History: Family History  Problem Relation Age of Onset   Heart disease Other        No family history   Diabetes Sister     Social History: Social History   Socioeconomic History   Marital status: Divorced    Spouse name: Not on file   Number of children: 4   Years of education: Not on file   Highest education level: Not on file  Occupational History   Not on file  Tobacco Use   Smoking status: Some Days    Types: Cigars   Smokeless tobacco: Never  Vaping Use    Vaping Use: Never used  Substance and Sexual Activity   Alcohol use: Not Currently    Alcohol/week: 0.0 standard drinks   Drug use: Never   Sexual activity: Not on file  Other Topics Concern   Not on file  Social History Narrative   Not on file   Social Determinants of Health   Financial Resource Strain: Not on file  Food Insecurity: Not on file  Transportation Needs: Not on file  Physical Activity: Not on file  Stress:  Not on file  Social Connections: Not on file    Allergies:  Allergies  Allergen Reactions   Hymenoptera Venom Preparations Hives    Reaction to wasps   Lisinopril Other (See Comments)    angioedema   Piroxicam Other (See Comments)    burn   Shellfish Allergy Swelling   Amoxicillin-Pot Clavulanate Rash    Did it involve swelling of the face/tongue/throat, SOB, or low BP? No Did it involve sudden or severe rash/hives, skin peeling, or any reaction on the inside of your mouth or nose? Yes Did you need to seek medical attention at a hospital or doctor's office? Yes When did it last happen?      over 10 years If all above answers are NO, may proceed with cephalosporin use.     Objective:    Vital Signs:   Temp:  [97.3 F (36.3 C)-97.6 F (36.4 C)] 97.4 F (36.3 C) (03/07 1027) Pulse Rate:  [67-73] 69 (03/07 1027) Resp:  [18-19] 19 (03/07 0727) BP: (92-112)/(46-62) 112/59 (03/07 1027) SpO2:  [94 %-100 %] 94 % (03/07 0727) Weight:  [81.6 kg] 81.6 kg (03/07 0455) Last BM Date : 08/28/21  Weight change: Filed Weights   08/26/21 0427 08/27/21 0301 08/28/21 0455  Weight: 83.1 kg 82.2 kg 81.6 kg    Intake/Output:   Intake/Output Summary (Last 24 hours) at 08/28/2021 1151 Last data filed at 08/28/2021 0849 Gross per 24 hour  Intake 555.55 ml  Output 1125 ml  Net -569.45 ml      Physical Exam    General: Lying in bed. HEENT: normal Neck: supple. + JVD. Carotids 2+ bilat; no bruits.  Cor: PMI nondisplaced. Regular rate & rhythm. No rubs,  gallops or murmurs. Lungs: bibasilar rales Abdomen: soft, nontender, nondistended. No hepatosplenomegaly.  Extremities: no cyanosis, clubbing, rash, 2+ edema Neuro: alert & orientedx3, cranial nerves grossly intact. moves all 4 extremities w/o difficulty. Affect pleasant  EKG    AF 97 bpm, poor R wave progression  Labs   Basic Metabolic Panel: Recent Labs  Lab 08/24/21 0145 08/24/21 0318 08/24/21 1126 08/25/21 0328 08/26/21 0225 08/27/21 0357 08/28/21 0322  NA 141 143  --  137 139 138 138  K 4.9 5.0  --  4.6 4.6 5.1 4.7  CL 114* 114*  --  110 110 108 107  CO2 18*  --   --  19* 20* 18* 18*  GLUCOSE 104* 98  --  137* 93 86 96  BUN 51* 57*  --  57* 61* 64* 72*  CREATININE 3.76* 3.90*  --  4.07* 4.45* 4.63* 4.78*  CALCIUM 9.5  --   --  8.9 9.0 9.1 9.2  MG  --   --  1.7  --   --  1.8  --     Liver Function Tests: Recent Labs  Lab 08/24/21 0145  AST 17  ALT 13  ALKPHOS 50  BILITOT 0.9  PROT 6.2*  ALBUMIN 3.4*   No results for input(s): LIPASE, AMYLASE in the last 168 hours. No results for input(s): AMMONIA in the last 168 hours.  CBC: Recent Labs  Lab 08/24/21 0145 08/24/21 0318 08/27/21 0357  WBC 9.6  --  10.2  NEUTROABS 6.8  --   --   HGB 13.7 15.6 13.3  HCT 44.2 46.0 40.8  MCV 87.5  --  83.3  PLT 130*  --  124*    Cardiac Enzymes: No results for input(s): CKTOTAL, CKMB, CKMBINDEX, TROPONINI in the last 168  hours.  BNP: BNP (last 3 results) Recent Labs    08/24/21 0145  BNP 3,914.4*    ProBNP (last 3 results) No results for input(s): PROBNP in the last 8760 hours.   CBG: No results for input(s): GLUCAP in the last 168 hours.  Coagulation Studies: No results for input(s): LABPROT, INR in the last 72 hours.   Imaging   No results found.   Medications:     Current Medications:  apixaban  2.5 mg Oral BID   atorvastatin  80 mg Oral Daily   carvedilol  3.125 mg Oral Daily   ezetimibe  10 mg Oral Daily   midodrine  10 mg Oral BID WC    sodium chloride flush  3 mL Intravenous Q12H   sodium chloride flush  3 mL Intravenous Q12H    Infusions:  sodium chloride     sodium chloride     sodium chloride     furosemide 160 mg (08/28/21 0543)      Patient Profile   81 y.o male with history of CAD s/p MI and stenting, chronic systolic CHF/ICM s/p ICD, paroxysmal AF with recurrence, CKD IV with HD fistula in place. Admitted with acute on chronic CHF in setting of noncompliance with medications.  Assessment/Plan   Acute on chronic systolic CHF -EF previously 35-40% on echo 2014 and 2017 -Hx ICD 2014 -Felt to be ICM. Had MI and stent in 2012 per chart review (no prior cath report available). Prior care in [place].  - Echo this admit with EF 20-25%, RV low normal, PASP 50 mmHg, mild MR, moderate TR - Admitted with a/c CHF in setting of noncompliance with medications. Had been off his medications for at least 3 months and restarted 2-3 weeks ago. Not sure how much recurrent AF contributing. Had < 1% burden on device check in January. In AF on admit. - BNP > 3,900 on admit - Very sluggish diuresis with IV lasix and worsening renal function. Going for RHC today to assess filling pressures and CO. Will leave central line at end of case for closer hemodynamic monitoring. -Will need to stop coreg if low-output confirmed -GDMT limited by hypotension and AKI on CKD -On midodrine 10 mg TID for BP support  2. CAD - Hx MI and stent in 2012 per chart review - On atorvastatin 80 + zetia - No aspirin d/t anticoagulation  3. Atrial fibrillation -1% burden on last device check in January -in AF this admit, rate controlled.  -not sure how much contributing to #1 -TSH okay -On eliquis 5 mg BID  4. AKI on CKD IV - Scr baseline 3.2, up to 4.8 with attempts to diurese - Has AV fistula in place.  - Nephrology following. No urgent need for HD - On midodrine for BP support  5. Thrombocytopenia - Platelets 130>124 - Monitor  6.  AAA - 4.0 cm on CT   7. Tobacco use - Smoking cessation recommended  Length of Stay: 4  [name], [name], PA-C  08/28/2021, 11:51 AM  Advanced Heart Failure Team Pager [phone] (M-F; 7a - 5p)  Please contact [facility] for night-coverage after hours (4p -7a ) and weekends on [email or web address]   Patient seen and examined with the above-signed Advanced Practice Provider and/or Housestaff. I personally reviewed laboratory data, imaging studies and relevant notes. I independently examined the patient and formulated the important aspects of the plan. I have edited the note to reflect any of my changes or salient points. I have  personally discussed the plan with the patient and/or family.  81 y/o male with CKD IV, CAD with ischemic CM. PAF  Admitted with worsening HF in setting of non-compliance. Echo with EF 35%-> 20-25%. Poor response to IV lasix with persistent fluid overload and worsening AKI.   + SOB, edema, orthopnea and PND. No CP. BP slow. Requiring midodrine   General:  Elderly male  No resp difficulty HEENT: normal Neck: supple. JVP to ear . Carotids 2+ bilat; no bruits. No lymphadenopathy or thryomegaly appreciated. Cor: PMI nondisplaced. Regular rate & rhythm. 2/6 TR Lungs: clear Abdomen: soft, nontender, + distended. No hepatosplenomegaly. No bruits or masses. Good bowel sounds. Extremities: no cyanosis, clubbing, rash, 2-3+ edema Neuro: alert & orientedx3, cranial nerves grossly intact. moves all 4 extremities w/o difficulty. Affect pleasant   RHC today.   RA = 19 RV = 61/17 PA = 57/29 (41) PCW = 24 Fick cardiac output/index = 5.1/2.6 Thermo CO/CI = 3.4/1.7 PVR (Fick) = 3.3 WU PVR (Thermo) = 5.1WU Ao sat = 98% PA sat = 70%, 71% PAPi = 1.5   1. Biventricular failure with marked overload 2. Low cardiac output by Thermo (ok by Fick)  3. Mixed pulmonary HTN    Very tenuous. I think this is likely end-stage. Will increase lasix. If not responding could do empiric  trial of low-dose dobutamine. Will d/w Renal. Low likelihood of tolerating iHD. We should involve Palliative Care for [place] discussion.   Glori Bickers, MD  1:21 PM

## 2021-08-29 DIAGNOSIS — I7143 Infrarenal abdominal aortic aneurysm, without rupture: Secondary | ICD-10-CM | POA: Diagnosis not present

## 2021-08-29 DIAGNOSIS — N179 Acute kidney failure, unspecified: Secondary | ICD-10-CM | POA: Diagnosis not present

## 2021-08-29 DIAGNOSIS — N189 Chronic kidney disease, unspecified: Secondary | ICD-10-CM

## 2021-08-29 DIAGNOSIS — I5043 Acute on chronic combined systolic (congestive) and diastolic (congestive) heart failure: Secondary | ICD-10-CM | POA: Diagnosis not present

## 2021-08-29 DIAGNOSIS — I480 Paroxysmal atrial fibrillation: Secondary | ICD-10-CM | POA: Diagnosis not present

## 2021-08-29 LAB — COOXEMETRY PANEL
Carboxyhemoglobin: 1.6 % — ABNORMAL HIGH (ref 0.5–1.5)
Carboxyhemoglobin: 2.2 % — ABNORMAL HIGH (ref 0.5–1.5)
Methemoglobin: 0.9 % (ref 0.0–1.5)
Methemoglobin: <0.7 % (ref 0.0–1.5)
O2 Saturation: 80.4 %
O2 Saturation: 90.3 %
Total hemoglobin: 12.5 g/dL (ref 12.0–16.0)
Total hemoglobin: 12.2 g/dL (ref 12.0–16.0)

## 2021-08-29 LAB — BASIC METABOLIC PANEL
Anion gap: 9 (ref 5–15)
BUN: 72 mg/dL — ABNORMAL HIGH (ref 8–23)
CO2: 22 mmol/L (ref 22–32)
Calcium: 9.1 mg/dL (ref 8.9–10.3)
Chloride: 106 mmol/L (ref 98–111)
Creatinine, Ser: 4.68 mg/dL — ABNORMAL HIGH (ref 0.61–1.24)
GFR, Estimated: 12 mL/min — ABNORMAL LOW (ref >60)
Glucose, Bld: 97 mg/dL (ref 70–99)
Potassium: 4.2 mmol/L (ref 3.5–5.1)
Sodium: 137 mmol/L (ref 135–145)

## 2021-08-29 MED FILL — Heparin Sod (Porcine)-NaCl IV Soln 1000 Unit/500ML-0.9%: INTRAVENOUS | Qty: 500 | Status: AC

## 2021-08-29 NOTE — Progress Notes (Addendum)
? ? Advanced Heart Failure Rounding Note ? ?PCP-Cardiologist: Kirk Ruths, MD  ? ?Subjective:   ? ?08/28/21- RHC with biventricular failure, marked volume overload, low CO FIck (thermo ok), and mixed pulmonary HTN.  Started on milrinone 0.125 mcg. Diuresed with 160 mg IV lasix every 8 hours.   ? ?Remains on milrinone 0.125 mcg. CO-OX 80% ? ?Weight trending down another 3 pounds.  ? ?Creatinine 4.8>4.7  ? ?No complaints. Denies sob/nausea.  ? ?Objective:   ?Weight Range: ?80.5 kg ?Body mass index is 26.97 kg/m?.  ? ?Vital Signs:   ?Temp:  [97.3 ?F (36.3 ?C)-97.7 ?F (36.5 ?C)] 97.3 ?F (36.3 ?C) (03/08 3419) ?Pulse Rate:  [69-87] 87 (03/08 0513) ?Resp:  [18-35] 18 (03/07 1938) ?BP: (99-126)/(56-70) 99/56 (03/08 0513) ?SpO2:  [97 %-100 %] 99 % (03/08 0513) ?Weight:  [80.5 kg] 80.5 kg (03/08 0513) ?Last BM Date : 08/28/21 ? ?Weight change: ?Filed Weights  ? 08/27/21 0301 08/28/21 0455 08/29/21 0513  ?Weight: 82.2 kg 81.6 kg 80.5 kg  ? ? ?Intake/Output:  ? ?Intake/Output Summary (Last 24 hours) at 08/29/2021 0738 ?Last data filed at 08/29/2021 0600 ?Gross per 24 hour  ?Intake 120 ml  ?Output 800 ml  ?Net -680 ml  ?  ? ? ?Physical Exam  ?  ?General:  No resp difficulty. Sitting on the side of the bed.  ?HEENT: Normal ?Neck: Supple. JVP 12-13 . Carotids 2+ bilat; no bruits. No lymphadenopathy or thyromegaly appreciated. RIJ ?Cor: PMI nondisplaced. Irregular rate & rhythm. No rubs, gallops or murmurs. ?Lungs: Clear ?Abdomen: Soft, nontender, nondistended. No hepatosplenomegaly. No bruits or masses. Good bowel sounds. ?Extremities: No cyanosis, clubbing, rash, R and LLE 2+ edema. RUE AV fistula ?Neuro: Alert & orientedx3, cranial nerves grossly intact. moves all 4 extremities w/o difficulty. Affect pleasant ? ? ?Telemetry  ? ?Afib 60-80s with occasional PVCs.  ? ?EKG  ?  ?N/A ? ?Labs  ?  ?CBC ?Recent Labs  ?  08/27/21 ?0357 08/28/21 ?1233  ?WBC 10.2  --   ?HGB 13.3 14.6  14.6  ?HCT 40.8 43.0  43.0  ?MCV 83.3  --   ?PLT 124*   --   ? ?Basic Metabolic Panel ?Recent Labs  ?  08/27/21 ?0357 08/28/21 ?0322 08/28/21 ?1233 08/29/21 ?0507  ?NA 138 138 138  138 137  ?K 5.1 4.7 4.6  4.6 4.2  ?CL 108 107  --  106  ?CO2 18* 18*  --  22  ?GLUCOSE 86 96  --  97  ?BUN 64* 72*  --  72*  ?CREATININE 4.63* 4.78*  --  4.68*  ?CALCIUM 9.1 9.2  --  9.1  ?MG 1.8  --   --   --   ? ?Liver Function Tests ?No results for input(s): AST, ALT, ALKPHOS, BILITOT, PROT, ALBUMIN in the last 72 hours. ?No results for input(s): LIPASE, AMYLASE in the last 72 hours. ?Cardiac Enzymes ?No results for input(s): CKTOTAL, CKMB, CKMBINDEX, TROPONINI in the last 72 hours. ? ?BNP: ?BNP (last 3 results) ?Recent Labs  ?  08/24/21 ?0145  ?BNP 3,914.4*  ? ? ?ProBNP (last 3 results) ?No results for input(s): PROBNP in the last 8760 hours. ? ? ?D-Dimer ?No results for input(s): DDIMER in the last 72 hours. ?Hemoglobin A1C ?No results for input(s): HGBA1C in the last 72 hours. ?Fasting Lipid Panel ?No results for input(s): CHOL, HDL, LDLCALC, TRIG, CHOLHDL, LDLDIRECT in the last 72 hours. ?Thyroid Function Tests ?No results for input(s): TSH, T4TOTAL, T3FREE, THYROIDAB in the last  72 hours. ? ?Invalid input(s): FREET3 ? ?Other results: ? ? ?Imaging  ? ? ?CARDIAC CATHETERIZATION ? ?Result Date: 08/28/2021 ?Findings: RA = 19 RV = 61/17 PA = 57/29 (41) PCW = 24 Fick cardiac output/index = 5.1/2.6 Thermo CO/CI = 3.4/1.7 PVR (Fick) = 3.3 WU PVR (Thermo) = 5.1WU Ao sat = 98% PA sat = 70%, 71% PAPi = 1.5 Assessment: 1. Biventricular failure with marked overload 2. Low cardiac output by Thermo (ok by Fick) 3. Mixed pulmonary HTN Plan/Discussion: Very tenuous. I think this is likely end-stage. Will increase lasix. If not responding could do empiric trial of low-dose dobutamine. Will d/w Renal. Glori Bickers, MD 1:14 PM  ? ? ?Medications:   ? ? ?Scheduled Medications: ? apixaban  2.5 mg Oral BID  ? atorvastatin  80 mg Oral Daily  ? Chlorhexidine Gluconate Cloth  6 each Topical Daily  ?  ezetimibe  10 mg Oral Daily  ? midodrine  10 mg Oral BID WC  ? sodium chloride flush  3 mL Intravenous Q12H  ? sodium chloride flush  3 mL Intravenous Q12H  ? ? ?Infusions: ? sodium chloride    ? sodium chloride    ? furosemide 160 mg (08/29/21 0513)  ? milrinone 0.125 mcg/kg/min (08/28/21 1749)  ? ? ?PRN Medications: ?sodium chloride, sodium chloride, acetaminophen, ondansetron (ZOFRAN) IV, oxyCODONE-acetaminophen, sodium chloride flush, sodium chloride flush ? ? ? ?Patient Profile  ? ?  ?81 y/o male with CKD IV, CAD with ischemic CM. PAF ?  ?Admitted with worsening HF in setting of non-compliance. Echo with EF 35%-> 20-25%. Poor response to IV lasix with persistent fluid overload and worsening AKI ? ?Assessment/Plan  ?Acute on chronic systolic CHF ?-EF previously 35-40% on echo 2014 and 2017 ?-Hx ICD 2014 ?-Felt to be ICM. Had MI and stent in 2012 per chart review (no prior cath report available). Prior care in [place].  ?- Echo this admit with EF 20-25%, RV low normal, PASP 50 mmHg, mild MR, moderate TR ?- Admitted with a/c CHF in setting of noncompliance with medications. Had been off his medications for at least 3 months and restarted 2-3 weeks ago. Not sure how much recurrent AF contributing. Had < 1% burden on device check in January. In AF on admit. BNP > 3,900 on admit ?- RHC marked volume overload, low CO by thermo and mixed pulmonary HTN. Started on milrinone 0.125 mcg. Will continue to augment diuresis. Weight down another 3 pounds.  ?- Set up CVP. Continue high dose IV lasix 160 mg every hours.  ?-Will need to stop coreg if low-output confirmed ?-GDMT limited by hypotension and AKI on CKD ?-On midodrine 10 mg TID for BP support ?  ?2. CAD ?- Hx MI and stent in 2012 per chart review ?- No chest pain.  ?- On atorvastatin 80 + zetia ?- No aspirin d/t anticoagulation ?  ?3. Atrial fibrillation ?-1% burden on last device check in January ?-in AF this admit, rate controlled.   ?-not sure how much  contributing to #1 ?-TSH okay ?-On eliquis 5 mg BID ?  ?4. AKI on CKD IV ?- Scr baseline 3.2, up to 4.8 with attempts to diurese ?- Has AV fistula in place.  ?- Nephrology following.  ?- No indication for HD.  ?- Continue midodrine for BP support ?  ?5. Thrombocytopenia ?- Platelets 130>124> no CBC today  ?- Monitor ?  ?6. AAA ?- 4.0 cm on CT  ?  ?7. Tobacco use ?- Smoking cessation  recommended ? ?8. GOC ?Palliative Care was consulted.  ? ? ? ?Length of Stay: 5 ? ?Darrick Grinder, NP  ?08/29/2021, 7:38 AM ? ?Advanced Heart Failure Team ?Pager [phone] (M-F; 7a - 5p)  ?Please contact [facility] for night-coverage after hours (5p -7a ) and weekends on [email or web address] ? ?Patient seen and examined with the above-signed Advanced Practice Provider and/or Housestaff. I personally reviewed laboratory data, imaging studies and relevant notes. I independently examined the patient and formulated the important aspects of the plan. I have edited the note to reflect any of my changes or salient points. I have personally discussed the plan with the patient and/or family. ? ?Diuresing well on milrinone and high-dose lasix. Co-ox high. Scr stable.  ? ?General:  Elderly weak appearing. No resp difficulty ?HEENT: normal ?Neck: supple. RIJ swan. Carotids 2+ bilat; no bruits. No lymphadenopathy or thryomegaly appreciated. ?Cor: PMI nondisplaced. irregular rate & rhythm. 2/6 TR ?Lungs: clear ?Abdomen: soft, nontender, nondistended. No hepatosplenomegaly. No bruits or masses. Good bowel sounds. ?Extremities: no cyanosis, clubbing, rash, 2+ edema ?Neuro: alert & orientedx3, cranial nerves grossly intact. moves all 4 extremities w/o difficulty. Affect pleasant ? ?Remains volume overloaded. Diuresing well on IV lasix and mirlinone. Will continue for now. Spoke with Nephrology. Patient felt to be poor candidate for outpatient HD. (I agree). Suspect Pallaitive Care may be best option. They are following.  ? ?Glori Bickers, MD  ?5:14 PM ? ? ? ? ?

## 2021-08-29 NOTE — Progress Notes (Signed)
This chaplain responded to PMT consult for spiritual care. The Pt. is awake and shares he was not expecting the chaplain until tomorrow. ? ?The chaplain opened the door for story telling. The chaplain understands the Pt. religious beliefs in God started as a child and guides his identity today and purpose for life. The Pt. smiles as he shares his hope for tomorrows family meeting,  "a setting for positive communication among all the participants."  ? ?The Pt. welcomed the chaplain's invitation for prayer and F/U spiritual care. ? ?Chaplain Sallyanne Kuster ?[phone] ?

## 2021-08-29 NOTE — Progress Notes (Signed)
?Progress Note ? ? ?Patient: Anthony Foster WCB:[id number] DOB: 11-Feb-1941 DOA: 08/24/2021     5 ?DOS: the patient was seen and examined on 08/29/2021 ?  ?Brief hospital course: ?Anthony Foster was admitted to the hospital with the working diagnosis of decompensated heart failure.  ? ?81 yo male with the past medical history of heart failure, atrial fibrillation, CKD stage IV who presented with edema. Reported worsening lower extremity edema, progressing to scrotal edema. He has not been compliant with his medications. On his initial physical examination his blood pressure was 102/78, HR 83, RR 14 and oxygen saturation 98%, lungs with decreased breath sounds, heart with S1 and S2 present irregularly irregular, with no gallops, abdomen protuberant and positive lower extremity edema.  ?Right upper extremity fistula in place.  ? ?Na 141, K 4.9 CL 114 bicarbonate 18, glucose 104 ,bun 51,cr 3,76  ?BNP 3,914 ?Wbc 9,6, hgb 13,7 hct 44,2 plt 130  ?Sars covid 19 negative  ?Urine analysis with sg 1,019 with > 50 rbc and 0-5 wbc  ? ?Ct renal with bilateral scrotal hydroceles, partial renal atrophy, no hydronephrosis, infrarenal aortic aneurysm 40 mm diameter.  ? ?Chest radiograph with cardiomegaly with no infiltrates. Mild hilar vascular congestion.  ? ?EKG 97 bpm, left axis deviation, left anterior fascicular block, with atrial fibrillation rhythm, with positive PVCs, no significant ST segment or T wave changes.  ? ?Patient was placed on diuretic therapy for volume overload.  ?Echocardiogram with reduced LV systolic function.  ? ?Right heart catheterization with low cardiac output.  ?Started on milrinone for inotropic support.  ? ? ?Assessment and Plan: ?* Acute on chronic combined systolic and diastolic CHF (congestive heart failure) ([place]) ?Echocardiogram with worsening LV systolic function down to 20 to 25%.  ? ?Continue to have edema at his lower extremities.  ?Documented urine output is 800 ml over last 24 hrs ?Blood  pressure 99 to 616 mmHg systolic.  ? ?Plan to continue with aggressive diuresis with furosemide drip, ?Blood pressure support with midodrine and milrinone.  ?Not able to use ras inhibition due to risk of hypotension, and not on B blockade due to low output heart failure decompensation.  ? ?Acute kidney injury superimposed on chronic kidney disease ([place]) ?Renal function with serum cr at 4.68 with K at 4,2 and serum bicarbonate at 22. ?Continue with diuresis for congestion ?Patient has a fistula for HD access but low blood pressure will be a limiting factor for renal replacement therapy.  ? ?Follow up on renal function in am ?Avoid hypotension and nephrotoxic medications.,  ? ?PAF (paroxysmal atrial fibrillation) (HCC) on chronic anticoagulation ?Heart rate has been controlled, continue anticoagulation with apixaban.  ? ?AAA (abdominal aortic aneurysm) ?Patient has a known AAA last evaluated by ultrasound 05/2021 noted to be 4 cm at that time. ? ?Thrombocytopenia ([place]) ?Reactive thrombocytopenia, with PLT down to 124.  ?Plan to check cell count in am.  ? ? ?Essential hypertension ?Patient with hypotension, continue with milrinone and midodrine for blood pressure support.  ? ?ICD (implantable cardioverter-defibrillator) in place ?-s/p ICD for ischemic cardiomyopathy ? ? ? ? ?  ? ?Subjective: patient continue to have dyspnea and lower extremity edema, not at his baseline  ? ?Physical Exam: ?Vitals:  ? 08/28/21 1248 08/28/21 1357 08/28/21 1938 08/29/21 0513  ?BP: 113/70 (!) 101/59 (!) 126/59 (!) 99/56  ?Pulse: 74 73 77 87  ?Resp: (!) '25 18 18   '$ ?Temp:  (!) 97.5 ?F (36.4 ?C) 97.7 ?F (36.5 ?C) (!) 97.3 ?  F (36.3 ?C)  ?TempSrc:  Oral Oral Oral  ?SpO2: 98%  97% 99%  ?Weight:    80.5 kg  ?Height:      ? ?Neurology awake and alert ?ENT with mild pallor ?Cardiovascular with S1 and S2 present irregularly irregular with no gallops or rubs ?No JVD ?Positive lower extremity edema + pitting ?Respiratory with no wheezing or  rhonchi ?Abdomen soft  ?Data Reviewed: ? ? ? ?Family Communication: no family at the bedside  ? ?Disposition: ?Status is: Inpatient ?Remains inpatient appropriate because: heart and renal failure  ? Planned Discharge Destination: Home ? ? ? ?Author: ?Tawni Millers, MD ?08/29/2021 4:39 PM ? ?For on call review [email or web address].  ?

## 2021-08-29 NOTE — Consult Note (Cosign Needed)
Consultation Note Date: 08/29/2021   Patient Name: Anthony Foster  DOB: 1941/03/14  MRN: [id number]  Age / Sex: 81 y.o., male  PCP: Wenda Low, MD Referring Physician: Domenic Polite, MD  Reason for Consultation: Establishing goals of care and Psychosocial/spiritual support  HPI/Patient Profile: 81 y.o. male  admitted on 08/24/2021 with  Past medical history significant of combined systolic and diastolic CHF last EF 62-83% with grade 2 diastolic dysfunction, A-fib,  s/p AICD, COPD, AAA, CKD stage IV with fistula in place not yet on dialysis, and tobacco use who presents with complaints of swelling over the last 3 days.  Patient reports initial swelling in his legs progressing into his scrotum.  Increasing shortness of breath and abdominal distention.  Of note he reports in November of 2022 he had a home bout of diarrhea for which he had stopped taking all of his medicines.  However he recently restarted in the last 2-3 weeks and has been taking them as prescribed.  He reports that he recently had an echocardiogram.  Unclear if this was possibly done at the [place].  He has a right upper extremity fistula in place for about 3 years now, but has not needed to start on dialysis.  He is followed by [facility].  Admitted through the emergency room for treatment stabilization.   Marked fluid volume overload, mixed pulmonary hypertension.  Started on milrinone and diuresed with IV Lasix.  Chronic kidney disease, per nephrology not an urgent need of dialysis but patient would not be a good dialysis patient's secondary to hypotension and heart failure.  Patient faces treatment option decisions, advanced directive decisions and anticipatory care needs.    Clinical Assessment and Goals of Care:  This NP Wadie Lessen reviewed medical records, received report from team, assessed the patient and then meet  at the patient's bedside  to discuss diagnosis, prognosis, GOC, EOL wishes disposition and options.   Concept of Palliative Care was introduced as specialized medical care for people and their families living with serious illness.  If focuses on providing relief from the symptoms and stress of a serious illness.  The goal is to improve quality of life for both the patient and the family.  Values and goals of care important to patient and family were attempted to be elicited.  Created space and opportunity for patient to explore thoughts and feelings regarding current medical situation.  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 He needs time to process the situation, but states he has never wanted to initiate dialysis in the past.     A  discussion was had today regarding advanced directives.  Concepts specific to code status, artifical feeding and hydration, continued IV antibiotics and rehospitalization was had.     The difference between a aggressive medical intervention path  and a palliative comfort care path for this patient at this time was had.  Education offered on hospice benefit; philosophy and eligibility.  MOST form reintroduced.    A documented MOST form is in [place] dated 04-05-21 signed by Dr. Deforest Hoyles   Questions and concerns addressed.  Patient  encouraged to call with questions or concerns.     PMT will continue to support holistically.  Plan is to met tomorrow in patient 's room at 1000 am, contacting son/Sharon  # [phone] and grandson [name] by conference call for continued family discussion regarding GOCs and anticipatory care needs.          No documented healthcare power of attorney, however patient tells me today that his grandson Herminio Kniskern is his main support and decision maker in the event that he cannot make decisions for  himself.        SUMMARY OF RECOMMENDATIONS    Code Status/Advance Care Planning: Full code Educated patient/family to consider DNR/DNI status understanding evidenced based poor outcomes in similar hospitalized patient, as the cause of arrest is likely associated with advanced chronic illness rather than an easily reversible acute cardio-pulmonary event.     Palliative Prophylaxis:  Delirium Protocol and Frequent Pain Assessment  Additional Recommendations (Limitations, Scope, Preferences): Full Scope Treatment  Psycho-social/Spiritual:  Desire for further Chaplaincy support:yes Additional Recommendations: Education on Hospice  Prognosis:  Unable to determine  Discharge Planning: To Be Determined      Primary Diagnoses: Present on Admission:  Acute on chronic combined systolic and diastolic CHF (congestive heart failure) (HCC)  Thrombocytopenia (HCC)  ICD (implantable cardioverter-defibrillator) in place  AAA (abdominal aortic aneurysm)  PAF (paroxysmal atrial fibrillation) (HCC) on chronic anticoagulation  Ischemic cardiomyopathy  Essential hypertension  CRI (chronic renal insufficiency), stage 4 (severe) ([place])  Tobacco use   I have reviewed the medical record, interviewed the patient and family, and examined the patient. The following aspects are pertinent.  Past Medical History:  Diagnosis Date   AAA (abdominal aortic aneurysm)    3.5 cm 04/2018   Atrial fibrillation (HCC)    CHF (congestive heart failure) (HCC)    Coronary artery disease    Prior stent   Degenerative joint disease    Full dentures    Gout    Hypercholesteremia    Hypertension    ICD (implantable cardioverter-defibrillator) battery depletion    Ischemic cardiomyopathy    Pneumonia    Renal insufficiency    Wears glasses    Social History   Socioeconomic History   Marital status: Divorced    Spouse name: Not on file   Number of children: 4   Years of education: Not on file    Highest education level: Not on file  Occupational History   Not on file  Tobacco Use   Smoking status: Some Days    Types: Cigars   Smokeless tobacco: Never  Vaping Use   Vaping Use: Never used  Substance and Sexual Activity   Alcohol use: Not Currently    Alcohol/week: 0.0 standard drinks   Drug use: Never   Sexual activity: Not on file  Other Topics Concern   Not on file  Social History Narrative   Not on file   Social Determinants of Health   Financial Resource Strain: Not on file  Food Insecurity: Not on file  Transportation Needs: Not on file  Physical Activity: Not on file  Stress: Not on file  Social Connections: Not on file   Family History  Problem Relation Age of Onset   Heart disease Other  No family history   Diabetes Sister    Scheduled Meds:  apixaban  2.5 mg Oral BID   atorvastatin  80 mg Oral Daily   Chlorhexidine Gluconate Cloth  6 each Topical Daily   ezetimibe  10 mg Oral Daily   midodrine  10 mg Oral BID WC   sodium chloride flush  3 mL Intravenous Q12H   sodium chloride flush  3 mL Intravenous Q12H   Continuous Infusions:  sodium chloride     sodium chloride     furosemide 160 mg (08/29/21 0513)   milrinone 0.125 mcg/kg/min (08/28/21 1749)   PRN Meds:.sodium chloride, sodium chloride, acetaminophen, ondansetron (ZOFRAN) IV, oxyCODONE-acetaminophen, sodium chloride flush, sodium chloride flush Medications Prior to Admission:  Prior to Admission medications   Medication Sig Start Date End Date Taking? Authorizing Provider  allopurinol (ZYLOPRIM) 300 MG tablet Take 300 mg by mouth daily. 07/26/16  Yes [provider]  apixaban (ELIQUIS) 2.5 MG TABS tablet Take 1 tablet (2.5 mg total) by mouth daily. 06/10/19  Yes Lelon Perla, MD  atorvastatin (LIPITOR) 80 MG tablet Take 1 tablet (80 mg total) by mouth daily. 09/12/15  Yes Lelon Perla, MD  carvedilol (COREG) 25 MG tablet Take 25 mg by mouth daily.  09/21/16  Yes  [provider]  ezetimibe (ZETIA) 10 MG tablet Take 1 tablet (10 mg total) by mouth daily. 06/08/21 09/06/21 Yes Lelon Perla, MD  furosemide (LASIX) 40 MG tablet Take 40 mg by mouth daily. 07/21/16  Yes [provider]  hydrALAZINE (APRESOLINE) 25 MG tablet Take 1 tablet (25 mg total) by mouth 3 (three) times daily. 09/12/15  Yes Lelon Perla, MD  isosorbide mononitrate (IMDUR) 60 MG 24 hr tablet Take 1 tablet (60 mg total) by mouth daily. 01/22/18  Yes Kilroy, Luke K, PA-C  nitroGLYCERIN (NITROSTAT) 0.4 MG SL tablet Place 1 tablet (0.4 mg total) under the tongue every 5 (five) minutes as needed for chest pain. 01/14/18 08/24/21 Yes Kilroy, Doreene Burke, PA-C  oxyCODONE-acetaminophen (PERCOCET/ROXICET) 5-325 MG tablet Take 1 tablet by mouth every 6 (six) hours as needed. 11/26/18  Yes Laurence Slate M, PA-C  potassium chloride (KLOR-CON M) 10 MEQ tablet Take 10 mEq by mouth daily. 08/01/21  Yes [provider]  PROAIR RESPICLICK 197 (90 Base) MCG/ACT AEPB Inhale 1 puff into the lungs every 4 (four) hours as needed (shortness of breath). 08/02/21  Yes [provider]   Allergies  Allergen Reactions   Hymenoptera Venom Preparations Hives    Reaction to wasps   Lisinopril Other (See Comments)    angioedema   Piroxicam Other (See Comments)    burn   Shellfish Allergy Swelling   Amoxicillin-Pot Clavulanate Rash    Did it involve swelling of the face/tongue/throat, SOB, or low BP? No Did it involve sudden or severe rash/hives, skin peeling, or any reaction on the inside of your mouth or nose? Yes Did you need to seek medical attention at a hospital or doctor's office? Yes When did it last happen?      over 10 years If all above answers are NO, may proceed with cephalosporin use.    Review of Systems  Constitutional:  Positive for fatigue.  Neurological:  Positive for weakness.   Physical Exam Cardiovascular:     Rate and Rhythm: Normal rate.  Pulmonary:      Effort: Pulmonary effort is normal.  Musculoskeletal:     Left lower leg: Edema present.  Skin:  General: Skin is warm and dry.  Neurological:     Mental Status: He is alert.     Motor: Weakness present.    Vital Signs: BP (!) 99/56    Pulse 87    Temp (!) 97.3 F (36.3 C) (Oral)    Resp 18    Ht '5\' 8"'  (1.727 m)    Wt 80.5 kg    SpO2 99%    BMI 26.97 kg/m  Pain Scale: 0-10   Pain Score: 0-No pain   SpO2: SpO2: 99 % O2 Device:SpO2: 99 % O2 Flow Rate: .O2 Flow Rate (L/min): 2 L/min  IO: Intake/output summary:  Intake/Output Summary (Last 24 hours) at 08/29/2021 0689 Last data filed at 08/29/2021 3406 Gross per 24 hour  Intake 120 ml  Output 500 ml  Net -380 ml    LBM: Last BM Date : 08/28/21 Baseline Weight: Weight: 83.2 kg Most recent weight: Weight: 80.5 kg     Palliative Assessment/Data:     Discussed with Dr. Justin Mend  Time In: 1100 Time Out: 1230 Time Total: 90  minutes Greater than 50%  of this time was spent counseling and coordinating care related to the above assessment and plan.  Signed by: Wadie Lessen, NP   Please contact Palliative Medicine Team phone at [phone] for questions and concerns.  For individual provider: See Shea Evans

## 2021-08-29 NOTE — Hospital Course (Signed)
Anthony Foster was admitted to the hospital with the working diagnosis of decompensated heart failure.  ? ?81 yo male with the past medical history of heart failure, atrial fibrillation, CKD stage IV who presented with edema. Reported worsening lower extremity edema, progressing to scrotal edema. He has not been compliant with his medications. On his initial physical examination his blood pressure was 102/78, HR 83, RR 14 and oxygen saturation 98%, lungs with decreased breath sounds, heart with S1 and S2 present irregularly irregular, with no gallops, abdomen protuberant and positive lower extremity edema.  ?Right upper extremity fistula in place.  ? ?Na 141, K 4.9 CL 114 bicarbonate 18, glucose 104 ,bun 51,cr 3,76  ?BNP 3,914 ?Wbc 9,6, hgb 13,7 hct 44,2 plt 130  ?Sars covid 19 negative  ?Urine analysis with sg 1,019 with > 50 rbc and 0-5 wbc  ? ?Ct renal with bilateral scrotal hydroceles, partial renal atrophy, no hydronephrosis, infrarenal aortic aneurysm 40 mm diameter.  ? ?Chest radiograph with cardiomegaly with no infiltrates. Mild hilar vascular congestion.  ? ?EKG 97 bpm, left axis deviation, left anterior fascicular block, with atrial fibrillation rhythm, with positive PVCs, no significant ST segment or T wave changes.  ? ?Patient was placed on diuretic therapy for volume overload.  ?Echocardiogram with reduced LV systolic function.  ? ?Right heart catheterization with low cardiac output.  ?Started on milrinone for inotropic support.  ? ?

## 2021-08-29 NOTE — Progress Notes (Signed)
[facility] ?ROUNDING NOTE  ? ?Subjective:  ? 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  History of paroxysmal atrial fibrillation and AICD placement.  History of COPD.  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  Creatinine 3.9 mg/dL when last noted.  Was evaluated for progression of renal disease.  Recommendations to continue diuretics.  Patient underwent right-sided heart catheterization, stent to high filling pressures with increased PA pressures.  Decision was made to stop milrinone. ? ?Urine output 500 cc  08/29/2020 weight down to 80.5 kg.   ? ? ?Blood pressure 99/56 pulse 89 temperature 97.6 O2 sats 99% ? ?Sodium 137 potassium 4.2 chloride 106 CO2 22 BUN 72 creatinine 4.68 glucose 97 calcium 9.1 ? ?Objective:  ?Vital signs in last 24 hours:  ?Temp:  [97.3 ?F (36.3 ?C)-97.7 ?F (36.5 ?C)] 97.3 ?F (36.3 ?C) (03/08 0109) ?Pulse Rate:  [69-87] 87 (03/08 0513) ?Resp:  [18-35] 18 (03/07 1938) ?BP: (99-126)/(56-70) 99/56 (03/08 0513) ?SpO2:  [94 %-100 %] 99 % (03/08 0513) ?Weight:  [80.5 kg] 80.5 kg (03/08 0513) ? ?Weight change: -1.179 kg ?Filed Weights  ? 08/27/21 0301 08/28/21 0455 08/29/21 0513  ?Weight: 82.2 kg 81.6 kg 80.5 kg  ? ? ?Intake/Output: ?I/O last 3 completed shifts: ?In: 120 [P.O.:120] ?Out: 1400 [Urine:1400] ?  ?Intake/Output this shift: ? No intake/output data recorded. ? ?CVS- RRR ?RS- CTA ?ABD- BS present soft non-distended ?EXT-3+ lower extremity edema ? ? ?Basic Metabolic Panel: ?Recent Labs  ?Lab 08/24/21 ?1126 08/25/21 ?0328 08/26/21 ?0225 08/27/21 ?0357 08/28/21 ?3235 08/28/21 ?1233 08/29/21 ?0507  ?NA  --  137 139 138 138 138  138 137  ?K  --  4.6 4.6 5.1 4.7 4.6  4.6 4.2  ?CL  --  110 110 108 107  --  106  ?CO2  --  19* 20* 18* 18*  --  22  ?GLUCOSE  --  137* 93 86 96  --  97  ?BUN  --  57* 61* 64* 72*  --  72*   ?CREATININE  --  4.07* 4.45* 4.63* 4.78*  --  4.68*  ?CALCIUM  --  8.9 9.0 9.1 9.2  --  9.1  ?MG 1.7  --   --  1.8  --   --   --   ? ? ? ?Liver Function Tests: ?Recent Labs  ?Lab 08/24/21 ?0145  ?AST 17  ?ALT 13  ?ALKPHOS 50  ?BILITOT 0.9  ?PROT 6.2*  ?ALBUMIN 3.4*  ? ? ?No results for input(s): LIPASE, AMYLASE in the last 168 hours. ?No results for input(s): AMMONIA in the last 168 hours. ? ?CBC: ?Recent Labs  ?Lab 08/24/21 ?0145 08/24/21 ?0318 08/27/21 ?0357 08/28/21 ?1233  ?WBC 9.6  --  10.2  --   ?NEUTROABS 6.8  --   --   --   ?HGB 13.7 15.6 13.3 14.6  14.6  ?HCT 44.2 46.0 40.8 43.0  43.0  ?MCV 87.5  --  83.3  --   ?PLT 130*  --  124*  --   ? ? ? ?Cardiac Enzymes: ?No results for input(s): CKTOTAL, CKMB, CKMBINDEX, TROPONINI in the last 168 hours. ? ?BNP: ?Invalid input(s): POCBNP ? ?CBG: ?No results for input(s): GLUCAP in the last 168 hours. ? ?Microbiology: ?Results for orders placed or performed during the hospital encounter of 08/24/21  ?Resp Panel by RT-PCR (  Flu A&B, Covid) Nasopharyngeal Swab     Status: None  ? Collection Time: 08/24/21  6:07 AM  ? Specimen: Nasopharyngeal Swab; Nasopharyngeal(NP) swabs in vial transport medium  ?Result Value Ref Range Status  ? SARS Coronavirus 2 by RT PCR NEGATIVE NEGATIVE Final  ?  Comment: (NOTE) ?SARS-CoV-2 target nucleic acids are NOT DETECTED. ? ?The SARS-CoV-2 RNA is generally detectable in upper respiratory ?specimens during the acute phase of infection. The lowest ?concentration of SARS-CoV-2 viral copies this assay can detect is ?138 copies/mL. A negative result does not preclude SARS-Cov-2 ?infection and should not be used as the sole basis for treatment or ?other patient management decisions. A negative result may occur with  ?improper specimen collection/handling, submission of specimen other ?than nasopharyngeal swab, presence of viral mutation(s) within the ?areas targeted by this assay, and inadequate number of viral ?copies(<138 copies/mL). A  negative result must be combined with ?clinical observations, patient history, and epidemiological ?information. The expected result is Negative. ? ?Fact Sheet for Patients:  ?EntrepreneurPulse.com.au ? ?Fact Sheet for Healthcare Providers:  ?IncredibleEmployment.be ? ?This test is no t yet approved or cleared by the [place] FDA and  ?has been authorized for detection and/or diagnosis of SARS-CoV-2 by ?FDA under an Emergency Use Authorization (EUA). This EUA will remain  ?in effect (meaning this test can be used) for the duration of the ?COVID-19 declaration under Section 564(b)(1) of the Act, 21 ?U.S.C.section 360bbb-3(b)(1), unless the authorization is terminated  ?or revoked sooner.  ? ? ?  ? Influenza A by PCR NEGATIVE NEGATIVE Final  ? Influenza B by PCR NEGATIVE NEGATIVE Final  ?  Comment: (NOTE) ?The Xpert Xpress SARS-CoV-2/FLU/RSV plus assay is intended as an aid ?in the diagnosis of influenza from Nasopharyngeal swab specimens and ?should not be used as a sole basis for treatment. Nasal washings and ?aspirates are unacceptable for Xpert Xpress SARS-CoV-2/FLU/RSV ?testing. ? ?Fact Sheet for Patients: ?EntrepreneurPulse.com.au ? ?Fact Sheet for Healthcare Providers: ?IncredibleEmployment.be ? ?This test is not yet approved or cleared by the [place] FDA and ?has been authorized for detection and/or diagnosis of SARS-CoV-2 by ?FDA under an Emergency Use Authorization (EUA). This EUA will remain ?in effect (meaning this test can be used) for the duration of the ?COVID-19 declaration under Section 564(b)(1) of the Act, 21 U.S.C. ?section 360bbb-3(b)(1), unless the authorization is terminated or ?revoked. ? ?Performed at [facility], [place] [address] [place], [place] ?[address] ?  ? ? ?Coagulation Studies: ?No results for input(s): LABPROT, INR in the last 72 hours. ? ?Urinalysis: ?No results for input(s): COLORURINE, LABSPEC,  [place], GLUCOSEU, HGBUR, BILIRUBINUR, KETONESUR, PROTEINUR, UROBILINOGEN, NITRITE, LEUKOCYTESUR in the last 72 hours. ? ?Invalid input(s): APPERANCEUR  ? ? ?Imaging: ?CARDIAC CATHETERIZATION ? ?Result Date: 08/28/2021 ?Findings: RA = 19 RV = 61/17 PA = 57/29 (41) PCW = 24 Fick cardiac output/index = 5.1/2.6 Thermo CO/CI = 3.4/1.7 PVR (Fick) = 3.3 WU PVR (Thermo) = 5.1WU Ao sat = 98% PA sat = 70%, 71% PAPi = 1.5 Assessment: 1. Biventricular failure with marked overload 2. Low cardiac output by Thermo (ok by Fick) 3. Mixed pulmonary HTN Plan/Discussion: Very tenuous. I think this is likely end-stage. Will increase lasix. If not responding could do empiric trial of low-dose dobutamine. Will d/w Renal. Glori Bickers, MD 1:14 PM  ? ? ?Medications:  ? ? sodium chloride    ? sodium chloride    ? furosemide 160 mg (08/29/21 0513)  ? milrinone 0.125 mcg/kg/min (08/28/21 1749)  ? ?  apixaban  2.5 mg Oral BID  ? atorvastatin  80 mg Oral Daily  ? Chlorhexidine Gluconate Cloth  6 each Topical Daily  ? ezetimibe  10 mg Oral Daily  ? midodrine  10 mg Oral BID WC  ? sodium chloride flush  3 mL Intravenous Q12H  ? sodium chloride flush  3 mL Intravenous Q12H  ? ?sodium chloride, sodium chloride, acetaminophen, ondansetron (ZOFRAN) IV, oxyCODONE-acetaminophen, sodium chloride flush, sodium chloride flush ? ?Assessment/ Plan:  ?Chronic kidney disease stage IV/V.  Does not appear to be in any need of urgent dialysis at this point.  Hypotension may be a limiting factor.  Patient has already been started on milrinone.  Very difficult situation may wish to consider consulting palliative medicine ?ANEMIA-stable. ?MBD-stable ?HTN/VOL-requires midodrine.  Continue IV Lasix for diuresis.  Responding with 1.7 L diuresis 08/27/2021.  However not a brisk diuresis that would be helpful.  Hypotension seems to be a limiting factor.  Not sure that patient is going to tolerate hemodialysis. ?ACCESS-has AV fistula in place. ? ? ? LOS: 5 ?Sherril Croon ?'@TODAY''@7'$ :24 AM ?  ?

## 2021-08-29 NOTE — Evaluation (Signed)
Physical Therapy Evaluation ?Patient Details ?Name: Anthony Foster ?MRN: [id number] ?DOB: Aug 30, 1940 ?Today's Date: 08/29/2021 ? ?History of Present Illness ? Pt is an 81 y.o. male who presented 08/24/21 with x3 day hx of edema in legs and scrotum along with SOB, mild nonproductive cough, abdominal distention, and intermittent wheezing. Pt admitted with acute on chronic combined systolic and diastolic CHF and AKI superimposed on CKD. S/p R heart cath 3/7. PMH: heart failure, afib, CKD stage IV, AAA, CAD, gout, HTN ?  ?Clinical Impression ? Pt presents with condition above and deficits mentioned below, see PT Problem List. PTA, he was mod I, intermittently using a SPC and intermittently no AD for mobility. Pt lives with relatives in a 2-level house with a level entry onto the main floor in which his bedroom and bathroom are on. Pt does navigate the flight of stairs inside intermittently though. Currently, pt displays some mild balance deficits and decreased activity tolerance/endurance. Pt is able to perform all functional mobility slowly, but without LOB, physical assistance, or UE support at this time. Pt would benefit from further acute PT services to maximize his return to baseline and educate him on managing his CHF and energy conservation techniques.    ?   ? ?Recommendations for follow up therapy are one component of a multi-disciplinary discharge planning process, led by the attending physician.  Recommendations may be updated based on patient status, additional functional criteria and insurance authorization. ? ?Follow Up Recommendations No PT follow up ? ?  ?Assistance Recommended at Discharge PRN  ?Patient can return home with the following ? Assistance with cooking/housework;Help with stairs or ramp for entrance ? ?  ?Equipment Recommendations None recommended by PT  ?Recommendations for Other Services ?    ?  ?Functional Status Assessment Patient has had a recent decline in their functional status and  demonstrates the ability to make significant improvements in function in a reasonable and predictable amount of time.  ? ?  ?Precautions / Restrictions Precautions ?Precautions: Fall (low risk) ?Precaution Comments: monitor BP ?Restrictions ?Weight Bearing Restrictions: No  ? ?  ? ?Mobility ? Bed Mobility ?Overal bed mobility: Modified Independent ?  ?  ?  ?  ?  ?  ?General bed mobility comments: Pt able to transition supine <> sit EOB without assistance, HOB elevated. ?  ? ?Transfers ?Overall transfer level: Needs assistance ?Equipment used: None ?Transfers: Sit to/from Stand ?Sit to Stand: Supervision ?  ?  ?  ?  ?  ?General transfer comment: Supervision for safety, no LOB. ?  ? ?Ambulation/Gait ?Ambulation/Gait assistance: Min guard ?Gait Distance (Feet): 100 Feet ?Assistive device: None ?Gait Pattern/deviations: Step-through pattern, Decreased stride length ?Gait velocity: reduced ?Gait velocity interpretation: <1.8 ft/sec, indicate of risk for recurrent falls ?  ?General Gait Details: Pt with slow, but mostly steady gait, no LOB, min guard for safety. ? ?Stairs ?  ?  ?  ?  ?  ? ?Wheelchair Mobility ?  ? ?Modified Rankin (Stroke Patients Only) ?  ? ?  ? ?Balance Overall balance assessment: Mild deficits observed, not formally tested ?  ?  ?  ?  ?  ?  ?  ?  ?  ?  ?  ?  ?  ?  ?  ?  ?  ?  ?   ? ? ? ?Pertinent Vitals/Pain Pain Assessment ?Pain Assessment: Faces ?Faces Pain Scale: No hurt ?Pain Intervention(s): Monitored during session  ? ? ?Home Living Family/patient expects to be discharged to:: Private  residence ?Living Arrangements: Other relatives (grandson and great granddaughter) ?Available Help at Discharge: Family;Available PRN/intermittently ?Type of Home: House ?Home Access: Level entry ?  ?  ?Alternate Level Stairs-Number of Steps: flight ?Home Layout: Two level;Able to live on main level with bedroom/bathroom;Other (Comment) (grandson's room downstairs) ?Home Equipment: Kasandra Knudsen - single point ?   ?  ?Prior  Function Prior Level of Function : Independent/Modified Independent;Driving ?  ?  ?  ?  ?  ?  ?Mobility Comments: Uses SPC intermittently. Denies any recent falls. ?ADLs Comments: Likes to bowl. ?  ? ? ?Hand Dominance  ?   ? ?  ?Extremity/Trunk Assessment  ? Upper Extremity Assessment ?Upper Extremity Assessment: Overall WFL for tasks assessed ?  ? ?Lower Extremity Assessment ?Lower Extremity Assessment: Overall WFL for tasks assessed ?  ? ?Cervical / Trunk Assessment ?Cervical / Trunk Assessment: Normal  ?Communication  ? Communication: No difficulties  ?Cognition Arousal/Alertness: Awake/alert ?Behavior During Therapy: [facility] for tasks assessed/performed ?Overall Cognitive Status: Within Functional Limits for tasks assessed ?  ?  ?  ?  ?  ?  ?  ?  ?  ?  ?  ?  ?  ?  ?  ?  ?  ?  ?  ? ?  ?General Comments General comments (skin integrity, edema, etc.): BP 100/46 supine, 109/45 sitting, 110/56 standing, 100/43 standing ~3 min, 94/37 sitting after gait bout, 94/73 after several min sitting after gait bout ? ?  ?Exercises    ? ?Assessment/Plan  ?  ?PT Assessment Patient needs continued PT services  ?PT Problem List Decreased activity tolerance;Decreased balance;Decreased mobility;Cardiopulmonary status limiting activity ? ?   ?  ?PT Treatment Interventions DME instruction;Gait training;Stair training;Functional mobility training;Therapeutic activities;Therapeutic exercise;Balance training;Neuromuscular re-education;Patient/family education   ? ?PT Goals (Current goals can be found in the Care Plan section)  ?Acute Rehab PT Goals ?Patient Stated Goal: to go home and go bowling ?PT Goal Formulation: With patient ?Time For Goal Achievement: 09/12/21 ?Potential to Achieve Goals: Good ? ?  ?Frequency Min 3X/week ?  ? ? ?Co-evaluation   ?  ?  ?  ?  ? ? ?  ?AM-PAC PT "6 Clicks" Mobility  ?Outcome Measure Help needed turning from your back to your side while in a flat bed without using bedrails?: None ?Help needed moving from  lying on your back to sitting on the side of a flat bed without using bedrails?: None ?Help needed moving to and from a bed to a chair (including a wheelchair)?: A Little ?Help needed standing up from a chair using your arms (e.g., wheelchair or bedside chair)?: A Little ?Help needed to walk in hospital room?: A Little ?Help needed climbing 3-5 steps with a railing? : A Little ?6 Click Score: 20 ? ?  ?End of Session   ?Activity Tolerance: Patient tolerated treatment well ?Patient left: in bed;with call bell/phone within reach ?Nurse Communication: Mobility status;Other (comment) (BP) ?PT Visit Diagnosis: Unsteadiness on feet (R26.81);Other abnormalities of gait and mobility (R26.89);Difficulty in walking, not elsewhere classified (R26.2) ?  ? ?Time: 5883-2549 ?PT Time Calculation (min) (ACUTE ONLY): 23 min ? ? ?Charges:   PT Evaluation ?$PT Eval Moderate Complexity: 1 Mod ?PT Treatments ?$Gait Training: 8-22 mins ?  ?   ? ? ?Anthony Foster, PT, DPT ?Acute Rehabilitation Services  ?Pager: [phone] ?Office: [phone] ? ? ?Maretta Bees Pettis ?08/29/2021, 5:06 PM ? ?

## 2021-08-30 DIAGNOSIS — I10 Essential (primary) hypertension: Secondary | ICD-10-CM | POA: Diagnosis not present

## 2021-08-30 DIAGNOSIS — I480 Paroxysmal atrial fibrillation: Secondary | ICD-10-CM | POA: Diagnosis not present

## 2021-08-30 DIAGNOSIS — I7143 Infrarenal abdominal aortic aneurysm, without rupture: Secondary | ICD-10-CM | POA: Diagnosis not present

## 2021-08-30 DIAGNOSIS — I5043 Acute on chronic combined systolic (congestive) and diastolic (congestive) heart failure: Secondary | ICD-10-CM | POA: Diagnosis not present

## 2021-08-30 DIAGNOSIS — N179 Acute kidney failure, unspecified: Secondary | ICD-10-CM | POA: Diagnosis not present

## 2021-08-30 LAB — BASIC METABOLIC PANEL
Anion gap: 9 (ref 5–15)
BUN: 74 mg/dL — ABNORMAL HIGH (ref 8–23)
CO2: 24 mmol/L (ref 22–32)
Calcium: 9 mg/dL (ref 8.9–10.3)
Chloride: 104 mmol/L (ref 98–111)
Creatinine, Ser: 4.86 mg/dL — ABNORMAL HIGH (ref 0.61–1.24)
GFR, Estimated: 11 mL/min — ABNORMAL LOW (ref >60)
Glucose, Bld: 109 mg/dL — ABNORMAL HIGH (ref 70–99)
Potassium: 4.1 mmol/L (ref 3.5–5.1)
Sodium: 137 mmol/L (ref 135–145)

## 2021-08-30 LAB — CBC
HCT: 35.9 % — ABNORMAL LOW (ref 39.0–52.0)
Hemoglobin: 12.1 g/dL — ABNORMAL LOW (ref 13.0–17.0)
MCH: 27.2 pg (ref 26.0–34.0)
MCHC: 33.7 g/dL (ref 30.0–36.0)
MCV: 80.7 fL (ref 80.0–100.0)
Platelets: 133 10*3/uL — ABNORMAL LOW (ref 150–400)
RBC: 4.45 MIL/uL (ref 4.22–5.81)
RDW: 15.2 % (ref 11.5–15.5)
WBC: 8.3 10*3/uL (ref 4.0–10.5)
nRBC: 0 % (ref 0.0–0.2)

## 2021-08-30 LAB — COOXEMETRY PANEL
Carboxyhemoglobin: 1.4 % (ref 0.5–1.5)
Methemoglobin: 3.1 % — ABNORMAL HIGH (ref 0.0–1.5)
O2 Saturation: 86.7 %
Total hemoglobin: 12.6 g/dL (ref 12.0–16.0)

## 2021-08-30 LAB — MAGNESIUM: Magnesium: 2.2 mg/dL (ref 1.7–2.4)

## 2021-08-30 MED ORDER — ACETAMINOPHEN 10 MG/ML IV SOLN
INTRAVENOUS | Status: AC
  Filled 2021-08-30: qty 100

## 2021-08-30 NOTE — Assessment & Plan Note (Signed)
Continue with atorvastatin 

## 2021-08-30 NOTE — Progress Notes (Signed)
Patient ID: DANTHONY KENDRIX, male   DOB: Nov 30, 1940, 81 y.o.   MRN: [id number] ? ? ? ?Progress Note from the Palliative Medicine Team at [facility] ? ? ?Patient Name: Anthony Foster        ?Date: 08/30/2021 ?DOB: [birth year]  Age: 81 y.o. MRN#: [id number] ?Attending Physician: Tawni Millers,* ?Primary Care Physician: Wenda Low, MD ?Admit Date: 08/24/2021 ? ? ?Medical records reviewed  ? ? 81 y.o. male  admitted on 08/24/2021 with  ?Past medical history significant of combined systolic and diastolic CHF last EF 78-29% with grade 2 diastolic dysfunction, A-fib,  s/p AICD, COPD, AAA, CKD stage IV with fistula in place not yet on dialysis, and tobacco use who presents with complaints of swelling over the last 3 days.  Patient reports initial swelling in his legs progressing into his scrotum.  Increasing shortness of breath and abdominal distention. ?  ?Of note he reports in November of 2022 he had a home bout of diarrhea for which he had stopped taking all of his medicines.  However he recently restarted in the last 2-3 weeks and has been taking them as prescribed.  He reports that he recently had an echocardiogram.  Unclear if this was possibly done at the [place].  He has a right upper extremity fistula in place for about 3 years now, but has not needed to start on dialysis.  He is followed by [facility]. ?  ?Admitted through the emergency room for treatment stabilization.   Marked fluid volume overload, mixed pulmonary hypertension.  Started on milrinone and diuresed with IV Lasix. ?  ?Chronic kidney disease, per nephrology not an urgent need of dialysis but patient would not be a good dialysis patient's secondary to hypotension and heart failure. ?  ?Patient faces treatment option decisions, advanced directive decisions and anticipatory care needs. ? ?This NP visited patient at the bedside as a follow up to  yesterday's [place], and to have scheduled telephone meeting with both his  grandson/ Lelon Mast  and his son/ Khyren Hing for continued conversation regarding current medical situation, goals of care, treatment option decisions and anticipatory care needs. ? ?Educate and offered on the seriousness of not only his end-stage heart failure but his end-stage renal disease, and the associated limited prognosis with cardiorenal syndrome.      ?All verbalize an understanding of the complex medical situation.  Many family work in the Physicist, medical. ? ?Patient clearly verbalizes NO interest in dialysis now or in the future, family support his decision. ? ? ?Plan of care ?-Full code ?    -Educated patient/family to consider DNR/DNI status understanding evidenced based poor outcomes in similar hospitalized patient, as the cause of arrest is likely associated with advanced chronic illness rather than an easily reversible acute cardio-pulmonary event.  ?      Patient was unable to make decision regarding DNR/DNI today. ?-No dialysis now or in the future ?-Patient hopes for continued medical maximization, he is open to home milrinone if offered, ultimately he hopes for more time with his family ? ? ?MOST form reintroduced, document from 04/05/2021 in [facility]/Dr Hussain--educated patient that it will be important to complete new document prior to disposition depending on his decisions pending outcomes of this hospitalization. ? ?No documented healthcare power of attorney, patient is able to verbalize that he would like for his grandson [name] and his son Ziggy to work together to make decisions in the event the patient cannot make decisions for  himself. ? ?Education offered on hospice benefit; philosophy and eligibility both in the home and residential. ? ?Patient and his family wish to continue conversation amongst themselves as they process the seriousness of patient's current medical situation and limited prognosis.    ? ?Anticipatory care needs to be considered, currently patient lives  with his grandson and 77 year old granddaughter, however grandson works outside the home, and granddaughter is busy with school. ? ?F/u meeting is scheduled for Monday at 10:30 am with telephone conference call with patient and his family. ? ? ?Questions and concerns addressed   Discussed with Dr Haroldine Laws, Dr. Cathlean Sauer and Dr. Justin Mend via secure chat ? ?PMT will continue to support holistically ? ? ?Wadie Lessen NP  ?Palliative Medicine Team Team Phone # [phone] ?Pager [phone] ?  ?

## 2021-08-30 NOTE — Progress Notes (Signed)
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  History of paroxysmal atrial fibrillation and AICD placement.  History of COPD.  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  Creatinine 3.9 mg/dL when last noted.  Was evaluated for progression of renal disease.  Recommendations to continue diuretics.  Patient underwent right-sided heart catheterization, stent to high filling pressures with increased PA pressures.  Decision was made to try milrinone.  Urine output 1.8 L  08/29/2020 weight down to 79.5   Blood pressure 99/56 pulse 89 temperature 97.6 O2 sats 99%  Sodium 137 potassium 4.2 chloride 106 CO2 22 BUN 72 creatinine 4.68 glucose 97 calcium 9.1  Objective:  Vital signs in last 24 hours:  Temp:  [97.5 F (36.4 C)-98.2 F (36.8 C)] 97.6 F (36.4 C) (03/09 0729) Pulse Rate:  [49-86] 79 (03/09 0729) Resp:  [18] 18 (03/09 0729) BP: (94-118)/(37-73) 105/57 (03/09 0729) SpO2:  [95 %-100 %] 100 % (03/09 0729) Weight:  [79.5 kg] 79.5 kg (03/09 0441)  Weight change: -0.998 kg Filed Weights   08/28/21 0455 08/29/21 0513 08/30/21 0441  Weight: 81.6 kg 80.5 kg 79.5 kg    Intake/Output: I/O last 3 completed shifts: In: 867.1 [P.O.:600; I.V.:48.2; IV Piggyback:219] Out: 3200 [Urine:3200]   Intake/Output this shift:  Total I/O In: 120 [P.O.:120] Out: 600 [Urine:600]  CVS- RRR RS- CTA ABD- BS present soft non-distended EXT- lower extremity edema   Basic Metabolic Panel: Recent Labs  Lab 08/24/21 1126 08/25/21 0328 08/26/21 0225 08/27/21 0357 08/28/21 0322 08/28/21 1233 08/29/21 0507 08/30/21 0444  NA  --    < > 139 138 138 138   138 137 137  K  --    < > 4.6 5.1 4.7 4.6   4.6 4.2 4.1  CL  --    < > 110 108 107  --  106 104  CO2  --    < > 20* 18* 18*  --   22 24  GLUCOSE  --    < > 93 86 96  --  97 109*  BUN  --    < > 61* 64* 72*  --  72* 74*  CREATININE  --    < > 4.45* 4.63* 4.78*  --  4.68* 4.86*  CALCIUM  --    < > 9.0 9.1 9.2  --  9.1 9.0  MG 1.7  --   --  1.8  --   --   --  2.2   < > = values in this interval not displayed.     Liver Function Tests: Recent Labs  Lab 08/24/21 0145  AST 17  ALT 13  ALKPHOS 50  BILITOT 0.9  PROT 6.2*  ALBUMIN 3.4*    No results for input(s): LIPASE, AMYLASE in the last 168 hours. No results for input(s): AMMONIA in the last 168 hours.  CBC: Recent Labs  Lab 08/24/21 0145 08/24/21 0318 08/27/21 0357 08/28/21 1233 08/30/21 0444  WBC 9.6  --  10.2  --  8.3  NEUTROABS 6.8  --   --   --   --   HGB 13.7 15.6 13.3 14.6   14.6 12.1*  HCT 44.2 46.0 40.8 43.0   43.0 35.9*  MCV 87.5  --  83.3  --  80.7  PLT  130*  --  124*  --  133*     Cardiac Enzymes: No results for input(s): CKTOTAL, CKMB, CKMBINDEX, TROPONINI in the last 168 hours.  BNP: Invalid input(s): POCBNP  CBG: No results for input(s): GLUCAP in the last 168 hours.  Microbiology: Results for orders placed or performed during the hospital encounter of 08/24/21  Resp Panel by RT-PCR (Flu A&B, Covid) Nasopharyngeal Swab     Status: None   Collection Time: 08/24/21  6:07 AM   Specimen: Nasopharyngeal Swab; Nasopharyngeal(NP) swabs in vial transport medium  Result Value Ref Range Status   SARS Coronavirus 2 by RT PCR NEGATIVE NEGATIVE Final    Comment: (NOTE) SARS-CoV-2 target nucleic acids are NOT DETECTED.  The SARS-CoV-2 RNA is generally detectable in upper respiratory specimens during the acute phase of infection. The lowest concentration of SARS-CoV-2 viral copies this assay can detect is 138 copies/mL. A negative result does not preclude SARS-Cov-2 infection and should not be used as the sole basis for treatment or other patient management decisions. A negative result may occur with  improper specimen  collection/handling, submission of specimen other than nasopharyngeal swab, presence of viral mutation(s) within the areas targeted by this assay, and inadequate number of viral copies(<138 copies/mL). A negative result must be combined with clinical observations, patient history, and epidemiological information. The expected result is Negative.  Fact Sheet for Patients:  EntrepreneurPulse.com.au  Fact Sheet for Healthcare Providers:  IncredibleEmployment.be  This test is no t yet approved or cleared by the [place] FDA and  has been authorized for detection and/or diagnosis of SARS-CoV-2 by FDA under an Emergency Use Authorization (EUA). This EUA will remain  in effect (meaning this test can be used) for the duration of the COVID-19 declaration under Section 564(b)(1) of the Act, 21 U.S.C.section 360bbb-3(b)(1), unless the authorization is terminated  or revoked sooner.       Influenza A by PCR NEGATIVE NEGATIVE Final   Influenza B by PCR NEGATIVE NEGATIVE Final    Comment: (NOTE) The Xpert Xpress SARS-CoV-2/FLU/RSV plus assay is intended as an aid in the diagnosis of influenza from Nasopharyngeal swab specimens and should not be used as a sole basis for treatment. Nasal washings and aspirates are unacceptable for Xpert Xpress SARS-CoV-2/FLU/RSV testing.  Fact Sheet for Patients: EntrepreneurPulse.com.au  Fact Sheet for Healthcare Providers: IncredibleEmployment.be  This test is not yet approved or cleared by the [place] FDA and has been authorized for detection and/or diagnosis of SARS-CoV-2 by FDA under an Emergency Use Authorization (EUA). This EUA will remain in effect (meaning this test can be used) for the duration of the COVID-19 declaration under Section 564(b)(1) of the Act, 21 U.S.C. section 360bbb-3(b)(1), unless the authorization is terminated or revoked.  Performed at [facility], [place] [address], [place], [place] [address]     Coagulation Studies: No results for input(s): LABPROT, INR in the last 72 hours.  Urinalysis: No results for input(s): COLORURINE, LABSPEC, PHURINE, GLUCOSEU, HGBUR, BILIRUBINUR, KETONESUR, PROTEINUR, UROBILINOGEN, NITRITE, LEUKOCYTESUR in the last 72 hours.  Invalid input(s): APPERANCEUR    Imaging: CARDIAC CATHETERIZATION  Result Date: 08/28/2021 Findings: RA = 19 RV = 61/17 PA = 57/29 (41) PCW = 24 Fick cardiac output/index = 5.1/2.6 Thermo CO/CI = 3.4/1.7 PVR (Fick) = 3.3 WU PVR (Thermo) = 5.1WU Ao sat = 98% PA sat = 70%, 71% PAPi = 1.5 Assessment: 1. Biventricular failure with marked overload 2. Low cardiac output by Thermo (ok by Fick) 3. Mixed  pulmonary HTN Plan/Discussion: Very tenuous. I think this is likely end-stage. Will increase lasix. If not responding could do empiric trial of low-dose dobutamine. Will d/w Renal. Glori Bickers, MD 1:14 PM    Medications:    sodium chloride     sodium chloride     furosemide 160 mg (08/30/21 0446)   milrinone 0.125 mcg/kg/min (08/30/21 0158)    apixaban  2.5 mg Oral BID   atorvastatin  80 mg Oral Daily   Chlorhexidine Gluconate Cloth  6 each Topical Daily   ezetimibe  10 mg Oral Daily   midodrine  10 mg Oral BID WC   sodium chloride flush  3 mL Intravenous Q12H   sodium chloride flush  3 mL Intravenous Q12H   sodium chloride, sodium chloride, acetaminophen, ondansetron (ZOFRAN) IV, oxyCODONE-acetaminophen, sodium chloride flush, sodium chloride flush  Assessment/ Plan:  Chronic kidney disease stage IV/V.  Does not appear to be in any need of urgent dialysis at this point.  Hypotension may be a limiting factor.  Patient has already been started on milrinone.  Palliative medicine involved.  I believe proceeding with hospice and palliative care at home would be appropriate ANEMIA-stable. MBD-stable HTN/VOL-requires midodrine.  Continuing on IV Lasix for diuresis.   Responding with 1.8 L diuresis 08/29/2021 ,appears to have had an improved diuresis with milrinone..     Hypotension seems to be a limiting factor.  Discussed dialysis with patient.  He does not wish to proceed.  We shall need to reach a decision about transitioning to oral diuretics.  There may be an indication for home IV milrinone and I will defer this to Dr. Haroldine Laws ACCESS-has AV fistula in place.    LOS: [place] '@TODAY''@8'$ :27 AM

## 2021-08-30 NOTE — Progress Notes (Signed)
?Progress Note ? ? ?Patient: Anthony Foster MPN:[id number] DOB: 06-Nov-1940 DOA: 08/24/2021     6 ?DOS: the patient was seen and examined on 08/30/2021 ?  ?Brief hospital course: ?[name] was admitted to the hospital with the working diagnosis of decompensated heart failure.  ? ?81 yo male with the past medical history of heart failure, atrial fibrillation, CKD stage IV who presented with edema. Reported worsening lower extremity edema, progressing to scrotal edema. He has not been compliant with his medications. On his initial physical examination his blood pressure was 102/78, HR 83, RR 14 and oxygen saturation 98%, lungs with decreased breath sounds, heart with S1 and S2 present irregularly irregular, with no gallops, abdomen protuberant and positive lower extremity edema.  ?Right upper extremity fistula in place.  ? ?Na 141, K 4.9 CL 114 bicarbonate 18, glucose 104 ,bun 51,cr 3,76  ?BNP 3,914 ?Wbc 9,6, hgb 13,7 hct 44,2 plt 130  ?Sars covid 19 negative  ?Urine analysis with sg 1,019 with > 50 rbc and 0-5 wbc  ? ?Ct renal with bilateral scrotal hydroceles, partial renal atrophy, no hydronephrosis, infrarenal aortic aneurysm 40 mm diameter.  ? ?Chest radiograph with cardiomegaly with no infiltrates. Mild hilar vascular congestion.  ? ?EKG 97 bpm, left axis deviation, left anterior fascicular block, with atrial fibrillation rhythm, with positive PVCs, no significant ST segment or T wave changes.  ? ?Patient was placed on diuretic therapy for volume overload.  ?Echocardiogram with reduced LV systolic function.  ? ?Right heart catheterization with low cardiac output.  ?Started on milrinone for inotropic support.  ? ? ?Assessment and Plan: ?* Acute on chronic combined systolic and diastolic CHF (congestive heart failure) ([place]) ?Echocardiogram with worsening LV systolic function down to 20 to 25%.  ? ?Continue to have edema at his lower extremities ++ pitting  ?Documented urine output is 2,550 ml over last 24  hrs ?Systolic blood pressure 008 to 118 mmHg  ? ?Plan to continue with aggressive diuresis with furosemide drip, ?Blood pressure support with midodrine and milrinone.  ?Not able to use ras inhibition due to risk of hypotension, and not on B blockade due to low output heart failure decompensation.  ? ?Plan to wean off milrinone in the next 24 hrs per cardiology recommendations.  ? ?Acute kidney injury superimposed on chronic kidney disease ([place]) ?Renal function with serum cr at 4,86 with k at 4,1 and serum bicarbonate at 24.   ? ?Continue diuresis for volume overload and blood pressure support with milrinone and midodrine.  ?Avoid hypotension and nephrotoxic agents. ? ?Patient continue to decline renal replacement therapy.  Palliative care has been consulted.  ? ?PAF (paroxysmal atrial fibrillation) (H[facility]) on chronic anticoagulation ?Heart rate has been controlled, continue anticoagulation with apixaban.  ? ?Hyperlipidemia ?Continue with atorvastatin.  ? ?Essential hypertension ?Blood pressure has been more stable in the low 676'P systolic. ?Plan to continue blood pressure support with midodrine and IV milrinone.  ?Continue blood pressure monitoring.  ? ?AAA (abdominal aortic aneurysm) ?Patient has a known AAA last evaluated by ultrasound 05/2021 noted to be 4 cm at that time. ? ?Thrombocytopenia ([place]) ?Plt has been improving, today is 133.  ?No signs of bleeding, continue close follow up. ?Likely reactive thrombocytopenia.  ? ? ?Tobacco use ?Smoking cessation  ? ? ? ? ?  ? ?Subjective: Patient is feeling better but continue to have edema of  his lower extremities.  ? ? ?Physical Exam: ?Vitals:  ? 08/29/21 1800 08/29/21 1949 08/30/21 0441 08/30/21 0729  ?  BP: (!) 105/51 (!) 114/37 118/65 (!) 105/57  ?Pulse: 60 86 86 79  ?Resp:  '18 18 18  '$ ?Temp:  98.2 ?F (36.8 ?C) 97.6 ?F (36.4 ?C) 97.6 ?F (36.4 ?C)  ?TempSrc:  Oral Oral Oral  ?SpO2:  96% 100% 100%  ?Weight:   79.5 kg   ?Height:      ? ?Neurology awake and alert ?ENT  with no pallor ?Cardiovascular with S1 and S2 present, irregular with no gallops or murmurs ?Moderate JVD ?Positive lower extremity edema ++ pitting ?Respiratory with rales bilaterally at bases no wheezing or rhonchi ?Abdomen soft  ?Data Reviewed: ? ? ? ?Family Communication: no family at the bedside  ? ?Disposition: ?Status is: Inpatient ?Remains inpatient appropriate because: heart failure management  ? Planned Discharge Destination: Home ? ? ? ?Author: ?Anthony Millers, MD ?08/30/2021 3:40 PM ? ?For on call review [email or web address].  ?

## 2021-08-30 NOTE — Assessment & Plan Note (Signed)
Smoking cessation  

## 2021-08-30 NOTE — Progress Notes (Addendum)
? ? Advanced Heart Failure Rounding Note ? ?PCP-Cardiologist: Kirk Ruths, MD  ? ?Subjective:   ? ?08/28/21- RHC with biventricular failure, marked volume overload, low CO by Fick (thermo ok), and mixed pulmonary HTN.  Started on milrinone 0.125 mcg.  ? ?Co-ox 87% on 0.125 milrinone ? ?2.5L UOP yesterday with 160 mg IV lasix every 8 hours.  Scr 4.86 today. ? ?CVP 9. Weight down another 2 lb overnight, 9 since admit.  ? ?Feeling much better. Dyspnea improving.  ? ?Met with Palliative Care today. Planning to discuss code status with his family. ? ? ?Objective:   ?Weight Range: ?79.5 kg ?Body mass index is 26.64 kg/m?.  ? ?Vital Signs:   ?Temp:  [97.5 ?F (36.4 ?C)-98.2 ?F (36.8 ?C)] 97.6 ?F (36.4 ?C) (03/09 0729) ?Pulse Rate:  [49-86] 79 (03/09 0729) ?Resp:  [18] 18 (03/09 0729) ?BP: (94-118)/(37-73) 105/57 (03/09 0729) ?SpO2:  [95 %-100 %] 100 % (03/09 0729) ?Weight:  [79.5 kg] 79.5 kg (03/09 0441) ?Last BM Date : 08/28/21 ? ?Weight change: ?Filed Weights  ? 08/28/21 0455 08/29/21 0513 08/30/21 0441  ?Weight: 81.6 kg 80.5 kg 79.5 kg  ? ? ?Intake/Output:  ? ?Intake/Output Summary (Last 24 hours) at 08/30/2021 1122 ?Last data filed at 08/30/2021 8469 ?Gross per 24 hour  ?Intake 600 ml  ?Output 3150 ml  ?Net -2550 ml  ?  ? ? ?Physical Exam  ?General:  Elderly male lying comfortably in bed. ?HEENT: normal ?Neck: supple. JVP 8-10. Carotids 2+ bilat; no bruits.  ?Cor: PMI nondisplaced. Irregular rhythm. No rubs, gallops, 2/6 TR murmur ?Lungs: clear ?Abdomen: soft, nontender, + distended. No hepatosplenomegaly. ?Extremities: no cyanosis, clubbing, rash, 2+ edema, RUE AV fistula ?Neuro: alert & orientedx3, cranial nerves grossly intact. moves all 4 extremities w/o difficulty. Affect pleasant ? ? ? ?Telemetry  ? ?AF 70s-80s, 5-10 PVCs/min, short runs NSVT up to 4 beats ? ?EKG  ?  ?N/A ? ?Labs  ?  ?CBC ?Recent Labs  ?  08/28/21 ?1233 08/30/21 ?0444  ?WBC  --  8.3  ?HGB 14.6  14.6 12.1*  ?HCT 43.0  43.0 35.9*  ?MCV  --  80.7   ?PLT  --  133*  ? ?Basic Metabolic Panel ?Recent Labs  ?  08/29/21 ?0507 08/30/21 ?0444  ?NA 137 137  ?K 4.2 4.1  ?CL 106 104  ?CO2 22 24  ?GLUCOSE 97 109*  ?BUN 72* 74*  ?CREATININE 4.68* 4.86*  ?CALCIUM 9.1 9.0  ?MG  --  2.2  ? ?Liver Function Tests ?No results for input(s): AST, ALT, ALKPHOS, BILITOT, PROT, ALBUMIN in the last 72 hours. ?No results for input(s): LIPASE, AMYLASE in the last 72 hours. ?Cardiac Enzymes ?No results for input(s): CKTOTAL, CKMB, CKMBINDEX, TROPONINI in the last 72 hours. ? ?BNP: ?BNP (last 3 results) ?Recent Labs  ?  08/24/21 ?0145  ?BNP 3,914.4*  ? ? ?ProBNP (last 3 results) ?No results for input(s): PROBNP in the last 8760 hours. ? ? ?D-Dimer ?No results for input(s): DDIMER in the last 72 hours. ?Hemoglobin A1C ?No results for input(s): HGBA1C in the last 72 hours. ?Fasting Lipid Panel ?No results for input(s): CHOL, HDL, LDLCALC, TRIG, CHOLHDL, LDLDIRECT in the last 72 hours. ?Thyroid Function Tests ?No results for input(s): TSH, T4TOTAL, T3FREE, THYROIDAB in the last 72 hours. ? ?Invalid input(s): FREET3 ? ?Other results: ? ? ?Imaging  ? ? ?No results found. ? ? ?Medications:   ? ? ?Scheduled Medications: ? apixaban  2.5 mg Oral BID  ? atorvastatin  80 mg Oral Daily  ? Chlorhexidine Gluconate Cloth  6 each Topical Daily  ? ezetimibe  10 mg Oral Daily  ? midodrine  10 mg Oral BID WC  ? sodium chloride flush  3 mL Intravenous Q12H  ? sodium chloride flush  3 mL Intravenous Q12H  ? ? ?Infusions: ? sodium chloride    ? sodium chloride    ? furosemide 160 mg (08/30/21 0446)  ? milrinone 0.125 mcg/kg/min (08/30/21 0158)  ? ? ?PRN Medications: ?sodium chloride, sodium chloride, acetaminophen, ondansetron (ZOFRAN) IV, oxyCODONE-acetaminophen, sodium chloride flush, sodium chloride flush ? ? ? ?Patient Profile  ? ?  ?81 y/o male with CKD IV, CAD with ischemic CM. PAF ?  ?Admitted with worsening HF in setting of non-compliance. Echo with EF 35%-> 20-25%. Poor response to IV lasix with  persistent fluid overload and worsening AKI ? ?Assessment/Plan  ?Acute on chronic systolic CHF ?-EF previously 35-40% on echo 2014 and 2017 ?-Hx ICD 2014 ?-Felt to be ICM. Had MI and stent in 2012 per chart review (no prior cath report available). Prior care in [place].  ?- Echo this admit with EF 20-25%, RV low normal, PASP 50 mmHg, mild MR, moderate TR ?- Admitted with a/c CHF in setting of noncompliance with medications. Had been off his medications for at least 3 months and restarted 2-3 weeks ago. Not sure how much recurrent AF contributing. Had < 1% burden on device check in January. In AF on admit. BNP > 3,900 on admit ?- RHC marked volume overload, low CO by thermo and mixed pulmonary HTN. Started on milrinone 0.125 mcg. Will continue milrinone to augment diuresis then stop. Not ideal for home inotrope. ?- CVP 9. Still appears volume up. Continue high dose IV lasix 160 mg q 8 hrs. Likely needs another 1-2 days of diuresis. ?-No BB with low-output HF ?-GDMT limited by hypotension and AKI on CKD ?-On midodrine 10 mg TID for BP support ?  ?2. CAD ?- Hx MI and stent in 2012 per chart review ?- No chest pain.  ?- On atorvastatin 80 + zetia ?- No aspirin d/t anticoagulation ?  ?3. Atrial fibrillation ?-1% burden on last device check in January ?-in AF this admit, rate controlled.   ?-not sure how much contributing to #1 ?-TSH okay ?-On eliquis 5 mg BID ?  ?4. AKI on CKD IV ?- Scr baseline 3.2, up to 4.8 with attempts to diurese ?- Has AV fistula in place.  ?- Nephrology following. Patient does not want HD. ?- Continue midodrine for BP support ?  ?5. Thrombocytopenia ?- Platelets 130>124>133  ?- Monitor ?  ?6. AAA ?- 4.0 cm on CT  ?  ?7. Tobacco use ?- Smoking cessation recommended ? ?8. [place] ?-Palliative Care consulted, appreciate assistance ?-Remains full code. Patient planning to discuss code status with family later today. Explained there would not likely be a good outcome from a code. ?-He does not want  HD ?-Not ideal candidate for home inotrope ? ? ? ?Length of Stay: 6 ? ?[name], [name], PA-C  ?08/30/2021, 11:22 AM ? ?Advanced Heart Failure Team ?Pager [phone] (M-F; 7a - 5p)  ?Please contact [facility] for night-coverage after hours (5p -7a ) and weekends on [email or web address] ? ?Patient seen and examined with the above-signed Advanced Practice Provider and/or Housestaff. I personally reviewed laboratory data, imaging studies and relevant notes. I independently examined the patient and formulated the important aspects of the plan. I have edited the note to reflect any  of my changes or salient points. I have personally discussed the plan with the patient and/or family. ? ?Feeling better with milrinone and diuresis. Breathing better. No SOB, orthopnea or PND. Co-ox ok. Creatinine up to 4.9. He does not want HD ? ?General:  Elderly . No resp difficulty ?HEENT: normal ?Neck: supple. JVP 9. Carotids 2+ bilat; no bruits. No lymphadenopathy or thryomegaly appreciated. ?Cor: PMI nondisplaced. Regular rate & rhythm. No rubs, gallops or murmurs. ?Lungs: clear ?Abdomen: soft, nontender, nondistended. No hepatosplenomegaly. No bruits or masses. Good bowel sounds. ?Extremities: no cyanosis, clubbing, rash, 1-2+ edema ?Neuro: alert & orientedx3, cranial nerves grossly intact. moves all 4 extremities w/o difficulty. Affect pleasant ? ?Diuresing well on milrinone and high-dose lasix. Feeling better but Scr still close to 5. He has decided against HD. He is not candidate for home milrinone with degree of renal dysfunction and I doubt it would have much impact on his outcome (suspect he feels better mostly due to diuresis). Will continue milrinone one more day until fully diuresed then stop without plans to restart. Strongly recommend Hospice care if HD not an option.  ? ?Glori Bickers, MD  ?12:25 PM ? ? ? ?

## 2021-08-31 DIAGNOSIS — I5043 Acute on chronic combined systolic (congestive) and diastolic (congestive) heart failure: Secondary | ICD-10-CM | POA: Diagnosis not present

## 2021-08-31 DIAGNOSIS — I480 Paroxysmal atrial fibrillation: Secondary | ICD-10-CM | POA: Diagnosis not present

## 2021-08-31 DIAGNOSIS — N179 Acute kidney failure, unspecified: Secondary | ICD-10-CM | POA: Diagnosis not present

## 2021-08-31 DIAGNOSIS — I10 Essential (primary) hypertension: Secondary | ICD-10-CM | POA: Diagnosis not present

## 2021-08-31 LAB — BASIC METABOLIC PANEL
Anion gap: 10 (ref 5–15)
BUN: 73 mg/dL — ABNORMAL HIGH (ref 8–23)
CO2: 27 mmol/L (ref 22–32)
Calcium: 9.1 mg/dL (ref 8.9–10.3)
Chloride: 103 mmol/L (ref 98–111)
Creatinine, Ser: 4.51 mg/dL — ABNORMAL HIGH (ref 0.61–1.24)
GFR, Estimated: 12 mL/min — ABNORMAL LOW (ref >60)
Glucose, Bld: 79 mg/dL (ref 70–99)
Potassium: 4 mmol/L (ref 3.5–5.1)
Sodium: 140 mmol/L (ref 135–145)

## 2021-08-31 LAB — COOXEMETRY PANEL
Carboxyhemoglobin: 1.5 % (ref 0.5–1.5)
Methemoglobin: 0.7 % (ref 0.0–1.5)
O2 Saturation: 77.1 %
Total hemoglobin: 13.3 g/dL (ref 12.0–16.0)

## 2021-08-31 LAB — MAGNESIUM: Magnesium: 2.1 mg/dL (ref 1.7–2.4)

## 2021-08-31 MED ORDER — POTASSIUM CHLORIDE CRYS ER 20 MEQ PO TBCR
20.0000 meq | EXTENDED_RELEASE_TABLET | Freq: Once | ORAL | Status: AC
  Administered 2021-08-31: 20 meq via ORAL
  Filled 2021-08-31: qty 1

## 2021-08-31 NOTE — Progress Notes (Signed)
PT Cancellation Note ? ?Patient Details ?Name: Anthony Foster ?MRN: [id number] ?DOB: 04-Sep-1940 ? ? ?Cancelled Treatment:    Reason Eval/Treat Not Completed: Fatigue/lethargy limiting ability to participate at this time. The pt states he has been ambulating in the room earlier today, and is too fatigued to attempt OOB mobility at this time. Will continue to follow and attempt to progress stair training and OOB mobility as pt able.  ? ?[name], PT, DPT  ? ?Acute Rehabilitation Department ?Pager #: [phone] - [phone] ? ? ?[name] ?08/31/2021, 4:51 PM ?

## 2021-08-31 NOTE — Progress Notes (Signed)
[facility] ?ROUNDING NOTE  ? ?Subjective:  ? 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  History of paroxysmal atrial fibrillation and AICD placement.  History of COPD.  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  Creatinine 3.9 mg/dL when last noted.  Was evaluated for progression of renal disease.  Recommendations to continue diuretics.  Patient underwent right-sided heart catheterization, stent to high filling pressures with increased PA pressures.  Decision was made to try milrinone. ? ?Urine output 2.9 L 08/30/2021 ? ? ?Blood pressure 105/55 pulse 85 temperature 97.7 O2 sats 99% room air ? ?Sodium 140 potassium 4 chloride 103 CO2 27 BUN 73 creatinine 4.5 glucose 79 calcium 9.1 magnesium 2.1 hemoglobin 12.1 ? ?Objective:  ?Vital signs in last 24 hours:  ?Temp:  [97.5 ?F (36.4 ?C)-97.7 ?F (36.5 ?C)] 97.7 ?F (36.5 ?C) (03/10 0543) ?Pulse Rate:  [87] 87 (03/09 1933) ?Resp:  [18-20] 20 (03/10 0543) ?BP: (105-129)/(55-57) 105/55 (03/10 0543) ?SpO2:  [99 %] 99 % (03/10 0543) ?Weight:  [75.7 kg] 75.7 kg (03/10 0551) ? ?Weight change: -3.765 kg ?Filed Weights  ? 08/29/21 0513 08/30/21 0441 08/31/21 0551  ?Weight: 80.5 kg 79.5 kg 75.7 kg  ? ? ?Intake/Output: ?I/O last 3 completed shifts: ?In: 340 [P.O.:240; IV Piggyback:100] ?Out: 5450 [[id number]] ?  ?Intake/Output this shift: ? No intake/output data recorded. ? ?CVS- RRR ?RS- CTA ?ABD- BS present soft non-distended ?EXT- lower extremity edema ? ? ?Basic Metabolic Panel: ?Recent Labs  ?Lab 08/24/21 ?1126 08/25/21 ?0328 08/27/21 ?0357 08/28/21 ?0322 08/28/21 ?1233 08/29/21 ?0507 08/30/21 ?0444 08/31/21 ?3785  ?NA  --    < > 138 138 138  138 137 137 140  ?K  --    < > 5.1 4.7 4.6  4.6 4.2 4.1 4.0  ?CL  --    < > 108 107  --  106 104 103  ?CO2  --    < > 18* 18*  --  '22 24 27  '$ ?GLUCOSE  --    <  > 86 96  --  97 109* 79  ?BUN  --    < > 64* 72*  --  72* 74* 73*  ?CREATININE  --    < > 4.63* 4.78*  --  4.68* 4.86* 4.51*  ?CALCIUM  --    < > 9.1 9.2  --  9.1 9.0 9.1  ?MG 1.7  --  1.8  --   --   --  2.2 2.1  ? < > = values in this interval not displayed.  ? ? ? ?Liver Function Tests: ?No results for input(s): AST, ALT, ALKPHOS, BILITOT, PROT, ALBUMIN in the last 168 hours. ? ?No results for input(s): LIPASE, AMYLASE in the last 168 hours. ?No results for input(s): AMMONIA in the last 168 hours. ? ?CBC: ?Recent Labs  ?Lab 08/27/21 ?8850 08/28/21 ?1233 08/30/21 ?0444  ?WBC 10.2  --  8.3  ?HGB 13.3 14.6  14.6 12.1*  ?HCT 40.8 43.0  43.0 35.9*  ?MCV 83.3  --  80.7  ?PLT 124*  --  133*  ? ? ? ?Cardiac Enzymes: ?No results for input(s): CKTOTAL, CKMB, CKMBINDEX, TROPONINI in the last 168 hours. ? ?BNP: ?Invalid input(s): POCBNP ? ?CBG: ?No results for input(s): GLUCAP in the last 168 hours. ? ?Microbiology: ?Results for orders placed or performed during the hospital encounter  of 08/24/21  ?Resp Panel by RT-PCR (Flu A&B, Covid) Nasopharyngeal Swab     Status: None  ? Collection Time: 08/24/21  6:07 AM  ? Specimen: Nasopharyngeal Swab; Nasopharyngeal(NP) swabs in vial transport medium  ?Result Value Ref Range Status  ? SARS Coronavirus 2 by RT PCR NEGATIVE NEGATIVE Final  ?  Comment: (NOTE) ?SARS-CoV-2 target nucleic acids are NOT DETECTED. ? ?The SARS-CoV-2 RNA is generally detectable in upper respiratory ?specimens during the acute phase of infection. The lowest ?concentration of SARS-CoV-2 viral copies this assay can detect is ?138 copies/mL. A negative result does not preclude SARS-Cov-2 ?infection and should not be used as the sole basis for treatment or ?other patient management decisions. A negative result may occur with  ?improper specimen collection/handling, submission of specimen other ?than nasopharyngeal swab, presence of viral mutation(s) within the ?areas targeted by this assay, and inadequate number  of viral ?copies(<138 copies/mL). A negative result must be combined with ?clinical observations, patient history, and epidemiological ?information. The expected result is Negative. ? ?Fact Sheet for Patients:  ?EntrepreneurPulse.com.au ? ?Fact Sheet for Healthcare Providers:  ?IncredibleEmployment.be ? ?This test is no t yet approved or cleared by the [place] FDA and  ?has been authorized for detection and/or diagnosis of SARS-CoV-2 by ?FDA under an Emergency Use Authorization (EUA). This EUA will remain  ?in effect (meaning this test can be used) for the duration of the ?COVID-19 declaration under Section 564(b)(1) of the Act, 21 ?U.S.C.section 360bbb-3(b)(1), unless the authorization is terminated  ?or revoked sooner.  ? ? ?  ? Influenza A by PCR NEGATIVE NEGATIVE Final  ? Influenza B by PCR NEGATIVE NEGATIVE Final  ?  Comment: (NOTE) ?The Xpert Xpress SARS-CoV-2/FLU/RSV plus assay is intended as an aid ?in the diagnosis of influenza from Nasopharyngeal swab specimens and ?should not be used as a sole basis for treatment. Nasal washings and ?aspirates are unacceptable for Xpert Xpress SARS-CoV-2/FLU/RSV ?testing. ? ?Fact Sheet for Patients: ?EntrepreneurPulse.com.au ? ?Fact Sheet for Healthcare Providers: ?IncredibleEmployment.be ? ?This test is not yet approved or cleared by the [place] FDA and ?has been authorized for detection and/or diagnosis of SARS-CoV-2 by ?FDA under an Emergency Use Authorization (EUA). This EUA will remain ?in effect (meaning this test can be used) for the duration of the ?COVID-19 declaration under Section 564(b)(1) of the Act, 21 U.S.C. ?section 360bbb-3(b)(1), unless the authorization is terminated or ?revoked. ? ?Performed at [facility], [place] [address] [place], [place] ?[address] ?  ? ? ?Coagulation Studies: ?No results for input(s): LABPROT, INR in the last 72 hours. ? ?Urinalysis: ?No results  for input(s): COLORURINE, LABSPEC, [place], GLUCOSEU, HGBUR, BILIRUBINUR, KETONESUR, PROTEINUR, UROBILINOGEN, NITRITE, LEUKOCYTESUR in the last 72 hours. ? ?Invalid input(s): APPERANCEUR  ? ? ?Imaging: ?No results found. ? ? ?Medications:  ? ? sodium chloride    ? sodium chloride    ? furosemide 160 mg (08/31/21 0651)  ? milrinone 0.125 mcg/kg/min (08/30/21 0158)  ? ? apixaban  2.5 mg Oral BID  ? atorvastatin  80 mg Oral Daily  ? Chlorhexidine Gluconate Cloth  6 each Topical Daily  ? ezetimibe  10 mg Oral Daily  ? midodrine  10 mg Oral BID WC  ? sodium chloride flush  3 mL Intravenous Q12H  ? sodium chloride flush  3 mL Intravenous Q12H  ? ?sodium chloride, sodium chloride, acetaminophen, ondansetron (ZOFRAN) IV, oxyCODONE-acetaminophen, sodium chloride flush, sodium chloride flush ? ?Assessment/ Plan:  ?Chronic kidney disease stage IV/V.  Does  not appear to be in any need of urgent dialysis at this point.  Hypotension may be a limiting factor.  Patient has already been started on milrinone.  Palliative medicine involved.  I believe proceeding with hospice and palliative care at home would be appropriate ?ANEMIA-stable. ?MBD-stable ?HTN/VOL-requires midodrine.  Continuing on IV Lasix for diuresis.  Responding with tremendous diuresis 2.9 L 08/30/2021,appears to have had an improved diuresis with milrinone..     Hypotension seems to be a limiting factor.  Discussed dialysis with patient.  He does not wish to proceed.  We shall need to reach a decision about transitioning to oral diuretics.  There may be an indication for home IV milrinone and I will defer this to Dr. Haroldine Laws ?ACCESS-has AV fistula in place. ? ? ? LOS: 7 ?Sherril Croon ?'@TODAY''@7'$ :15 AM ?  ?

## 2021-08-31 NOTE — Care Management Important Message (Signed)
Important Message ? ?Patient Details  ?Name: Anthony Foster ?MRN: [id number] ?Date of Birth: [birth year] ? ? ?Medicare Important Message Given:  Yes ? ? ? ? ?[name] ?08/31/2021, 9:24 AM ?

## 2021-08-31 NOTE — Progress Notes (Signed)
?Progress Note ? ? ?Patient: Anthony Foster WCB:[id number] DOB: 02-10-1941 DOA: 08/24/2021     7 ?DOS: the patient was seen and examined on 08/31/2021 ?  ?Brief hospital course: ?Anthony Foster was admitted to the hospital with the working diagnosis of decompensated heart failure.  ? ?81 yo male with the past medical history of heart failure, atrial fibrillation, CKD stage IV who presented with edema. Reported worsening lower extremity edema, progressing to scrotal edema. He has not been compliant with his medications. On his initial physical examination his blood pressure was 102/78, HR 83, RR 14 and oxygen saturation 98%, lungs with decreased breath sounds, heart with S1 and S2 present irregularly irregular, with no gallops, abdomen protuberant and positive lower extremity edema.  ?Right upper extremity fistula in place.  ? ?Na 141, K 4.9 CL 114 bicarbonate 18, glucose 104 ,bun 51,cr 3,76  ?BNP 3,914 ?Wbc 9,6, hgb 13,7 hct 44,2 plt 130  ?Sars covid 19 negative  ?Urine analysis with sg 1,019 with > 50 rbc and 0-5 wbc  ? ?Ct renal with bilateral scrotal hydroceles, partial renal atrophy, no hydronephrosis, infrarenal aortic aneurysm 40 mm diameter.  ? ?Chest radiograph with cardiomegaly with no infiltrates. Mild hilar vascular congestion.  ? ?EKG 97 bpm, left axis deviation, left anterior fascicular block, with atrial fibrillation rhythm, with positive PVCs, no significant ST segment or T wave changes.  ? ?Patient was placed on diuretic therapy for volume overload.  ?Echocardiogram with reduced LV systolic function.  ? ?Right heart catheterization with low cardiac output.  ?Started on milrinone for inotropic support.  ? ? ?Assessment and Plan: ?* Acute on chronic combined systolic and diastolic CHF (congestive heart failure) ([place]) ?Echocardiogram with worsening LV systolic function down to 20 to 25%.  ? ?Continue to have edema at his lower extremities ++ pitting bilaterally.  ?Documented urine output is 3,650  ml over last 24 hrs ?Systolic blood pressure continue to be stable at 105 mmHg  ? ?Plan to continue with aggressive diuresis with furosemide drip, ?Blood pressure support with midodrine and milrinone.  ?Not able to use ras inhibition due to risk of hypotension, and not on B blockade due to low output heart failure decompensation.  ? ?Continue with milrinone and midodrine  ? ?Acute kidney injury superimposed on chronic kidney disease ([place]) ?Volume slowly improving. ?Renal function with serum cr at 4,51 with K at 4,0 and serum bicarbonate at 27.  BUN is 73  ? ?Continue diuresis for volume overload and blood pressure support with milrinone and midodrine.  ?Avoid hypotension and nephrotoxic agents. ? ?Continue close follow up on renal function and electrolytes.  ? ?PAF (paroxysmal atrial fibrillation) (HCC) on chronic anticoagulation ?Heart rate has been controlled, continue anticoagulation with apixaban.  ? ?Hyperlipidemia ?Continue with atorvastatin.  ? ?Essential hypertension ?Blood pressure support with midodrine and IV milrinone.  ?Continue blood pressure monitoring.  ? ?AAA (abdominal aortic aneurysm) ?Patient has a known AAA last evaluated by ultrasound 05/2021 noted to be 4 cm at that time. ? ?Thrombocytopenia ([place]) ? No signs of bleeding, continue close follow up. ?Likely reactive thrombocytopenia.  ?Follow cell count in 48 hrs.  ? ? ?Tobacco use ?Smoking cessation  ? ? ? ? ?  ? ?Subjective: Patient is feeling better, no chest pain, dyspnea and edema have improved.  ? ?Physical Exam: ?Vitals:  ? 08/30/21 0729 08/30/21 1933 08/31/21 0543 08/31/21 0551  ?BP: (!) 105/57 (!) 129/57 (!) 105/55   ?Pulse: 79 87    ?Resp: 18 18  20   ?Temp: 97.6 ?F (36.4 ?C) (!) 97.5 ?F (36.4 ?C) 97.7 ?F (36.5 ?C)   ?TempSrc: Oral Oral Oral   ?SpO2: 100% 99% 99%   ?Weight:    75.7 kg  ?Height:      ? ?Neurology awake and alert ?ENT with mild pallor ?Cardiovascular with S1 and S2 present with no gallops or rubs, positive systolic murmur  at the apex ?No JVD ?Positive lower extremity edema ++ pitting bilaterally ?Abdomen soft  ?Data Reviewed: ? ? ? ?Family Communication: no family at the bedside  ? ?Disposition: ?Status is: Inpatient ?Remains inpatient appropriate because: heart failure and renal failure  ? Planned Discharge Destination: Home ? ? ? ?Author: ?Tawni Millers, MD ?08/31/2021 11:01 AM ? ?For on call review [email or web address].  ?

## 2021-08-31 NOTE — Progress Notes (Addendum)
? ? Advanced Heart Failure Rounding Note ? ?PCP-Cardiologist: Kirk Ruths, MD  ? ?Subjective:   ? ?08/28/21- RHC with biventricular failure, marked volume overload, low CO by Fick (thermo ok), and mixed pulmonary HTN.  Started on milrinone 0.125 mcg.  ? ?Co-ox 77% on 0.125 milrinone ? ?Diuresed well with high dose IV lasix yesterday. Down 9 lb overnight. CVP 11 ? ?Scr 4.68>4.86>4.51 ? ?Feeling better today. No dyspnea at rest. Has been up walking around the room. Feels like he still has a little fluid. ? ? ? ?Objective:   ?Weight Range: ?75.7 kg ?Body mass index is 25.38 kg/m?.  ? ?Vital Signs:   ?Temp:  [97.5 ?F (36.4 ?C)-97.7 ?F (36.5 ?C)] 97.7 ?F (36.5 ?C) (03/10 0543) ?Pulse Rate:  [87] 87 (03/09 1933) ?Resp:  [18-20] 20 (03/10 0543) ?BP: (105-129)/(55-57) 105/55 (03/10 0543) ?SpO2:  [99 %] 99 % (03/10 0543) ?Weight:  [75.7 kg] 75.7 kg (03/10 0551) ?Last BM Date : 08/31/21 ? ?Weight change: ?Filed Weights  ? 08/29/21 0513 08/30/21 0441 08/31/21 0551  ?Weight: 80.5 kg 79.5 kg 75.7 kg  ? ? ?Intake/Output:  ? ?Intake/Output Summary (Last 24 hours) at 08/31/2021 1001 ?Last data filed at 08/31/2021 0900 ?Gross per 24 hour  ?Intake 835.42 ml  ?Output 3050 ml  ?Net -2214.58 ml  ?  ? ? ?Physical Exam  ?General:  Elderly male, lying comfortably in bed. ?HEENT: normal ?Neck: supple. Jvp 10-12 cm. Carotids 2+ bilat; no bruits.  ?Cor: PMI nondisplaced. Rhythm irregular. No rubs, gallops or murmurs. ?Lungs: clear ?Abdomen: soft, nontender, + distended.  ?Extremities: no cyanosis, clubbing, rash, 2-3+ edema, RUE AVF ?Neuro: alert & orientedx3, cranial nerves grossly intact. moves all 4 extremities w/o difficulty. Affect pleasant ? ? ? ? ?Telemetry  ? ?AF 80s, 10-120 PVCs/min ? ?EKG  ?  ?N/A ? ?Labs  ?  ?CBC ?Recent Labs  ?  08/28/21 ?1233 08/30/21 ?0444  ?WBC  --  8.3  ?HGB 14.6  14.6 12.1*  ?HCT 43.0  43.0 35.9*  ?MCV  --  80.7  ?PLT  --  133*  ? ?Basic Metabolic Panel ?Recent Labs  ?  08/30/21 ?0444 08/31/21 ?6644  ?NA  137 140  ?K 4.1 4.0  ?CL 104 103  ?CO2 24 27  ?GLUCOSE 109* 79  ?BUN 74* 73*  ?CREATININE 4.86* 4.51*  ?CALCIUM 9.0 9.1  ?MG 2.2 2.1  ? ?Liver Function Tests ?No results for input(s): AST, ALT, ALKPHOS, BILITOT, PROT, ALBUMIN in the last 72 hours. ?No results for input(s): LIPASE, AMYLASE in the last 72 hours. ?Cardiac Enzymes ?No results for input(s): CKTOTAL, CKMB, CKMBINDEX, TROPONINI in the last 72 hours. ? ?BNP: ?BNP (last 3 results) ?Recent Labs  ?  08/24/21 ?0145  ?BNP 3,914.4*  ? ? ?ProBNP (last 3 results) ?No results for input(s): PROBNP in the last 8760 hours. ? ? ?D-Dimer ?No results for input(s): DDIMER in the last 72 hours. ?Hemoglobin A1C ?No results for input(s): HGBA1C in the last 72 hours. ?Fasting Lipid Panel ?No results for input(s): CHOL, HDL, LDLCALC, TRIG, CHOLHDL, LDLDIRECT in the last 72 hours. ?Thyroid Function Tests ?No results for input(s): TSH, T4TOTAL, T3FREE, THYROIDAB in the last 72 hours. ? ?Invalid input(s): FREET3 ? ?Other results: ? ? ?Imaging  ? ? ?No results found. ? ? ?Medications:   ? ? ?Scheduled Medications: ? apixaban  2.5 mg Oral BID  ? atorvastatin  80 mg Oral Daily  ? Chlorhexidine Gluconate Cloth  6 each Topical Daily  ? ezetimibe  10 mg Oral Daily  ? midodrine  10 mg Oral BID WC  ? sodium chloride flush  3 mL Intravenous Q12H  ? sodium chloride flush  3 mL Intravenous Q12H  ? ? ?Infusions: ? sodium chloride    ? sodium chloride    ? furosemide 160 mg (08/31/21 0651)  ? milrinone 0.125 mcg/kg/min (08/30/21 0158)  ? ? ?PRN Medications: ?sodium chloride, sodium chloride, acetaminophen, ondansetron (ZOFRAN) IV, oxyCODONE-acetaminophen, sodium chloride flush, sodium chloride flush ? ? ? ?Patient Profile  ? ?  ?81 y/o male with CKD IV, CAD with ischemic CM. PAF ?  ?Admitted with worsening HF in setting of non-compliance. Echo with EF 35%-> 20-25%. Poor response to IV lasix with persistent fluid overload and worsening AKI ? ?Assessment/Plan  ?Acute on chronic systolic CHF ?-EF  previously 35-40% on echo 2014 and 2017 ?-Hx ICD 2014 ?-Felt to be ICM. Had MI and stent in 2012 per chart review (no prior cath report available). Prior care in [place].  ?- Echo this admit with EF 20-25%, RV low normal, PASP 50 mmHg, mild MR, moderate TR ?- Admitted with a/c CHF in setting of noncompliance with medications. Had been off his medications for at least 3 months and restarted 2-3 weeks ago. Not sure how much recurrent AF contributing. Had < 1% burden on device check in January. In AF on admit. BNP > 3,900 on admit ?- RHC marked volume overload, low CO by thermo and mixed pulmonary HTN. Started on milrinone 0.125 mcg. Will continue milrinone to augment diuresis then stop. Not ideal for home inotrope. ?- CVP 11. Still appears volume up. Continue high dose IV lasix 160 mg q 8 hrs for today. Likely needs another 1-2 days of diuresis. ?-No BB with low-output HF ?-GDMT limited by hypotension and AKI on CKD ?-On midodrine 10 mg TID for BP support ?-TED hose ?  ?2. CAD ?- Hx MI and stent in 2012 per chart review ?- No chest pain.  ?- On atorvastatin 80 + zetia ?- No aspirin d/t anticoagulation ?  ?3. Atrial fibrillation ?-1% burden on last device check in January ?-in AF this admit, rate controlled.   ?-not sure how much contributing to #1 ?-TSH okay ?-On eliquis 2.5 mg BID ?  ?4. AKI on CKD IV ?- Scr baseline 3.2, up to 4.8 with attempts to diurese. Scr improved to 4.5 today. ?- Has AV fistula in place.  ?- Nephrology following. Patient does not want HD. ?- Continue midodrine for BP support ?  ?5. Thrombocytopenia ?- Platelets 130>124>133  ?- Monitor ?  ?6. AAA ?- 4.0 cm on CT  ?  ?7. Tobacco use ?- Smoking cessation recommended ? ?8. [place] ?-Palliative Care consulted, appreciate assistance ?-Remains full code. Family continuing [place] discussions. Palliative planning to meet with family again on 03/13. ?-He does not want HD ?-Not a candidate for home milrinone ? ? ? ?Length of Stay: 7 ? ?[name], [name], PA-C   ?08/31/2021, 10:01 AM ? ?Advanced Heart Failure Team ?Pager [phone] (M-F; 7a - 5p)  ?Please contact [facility] for night-coverage after hours (5p -7a ) and weekends on [email or web address] ? ? ?Patient seen and examined with the above-signed Advanced Practice Provider and/or Housestaff. I personally reviewed laboratory data, imaging studies and relevant notes. I independently examined the patient and formulated the important aspects of the plan. I have edited the note to reflect any of my changes or salient points. I have personally discussed the plan with the patient and/or family. ? ?  Continues to diurese well on high-dose IV lasix and IV milrinone. Weight down 17 pounds. Co-ox 77% CVP 11.  SCR stable at 4.6. Denies CP or SOB. Does not want HD.  ? ?General:  Elderly No resp difficulty ?HEENT: normal ?Neck: supple. RIJ TLC. CVP to jaw  Carotids 2+ bilat; no bruits. No lymphadenopathy or thryomegaly appreciated. ?Cor: PMI nondisplaced. Regular rate & rhythm. 2/6 TR ?Lungs: clear ?Abdomen: soft, nontender, nondistended. No hepatosplenomegaly. No bruits or masses. Good bowel sounds. ?Extremities: no cyanosis, clubbing, rash, 1-2+ edema ?Neuro: alert & orientedx3, cranial nerves grossly intact. moves all 4 extremities w/o difficulty. Affect pleasant ? ?Diuresing well on high dose lasix and milrinone. Will continue 1-2 more days until CVP in 5-6 range. Once fully diuresed will stop milrinone and see how he does. He is not interested in HD and not candidate for home milrinone so we will not plan on restarting inotropes once stopped.  ? ?Glori Bickers, MD  ?7:00 PM ? ? ? ?

## 2021-09-01 DIAGNOSIS — I10 Essential (primary) hypertension: Secondary | ICD-10-CM | POA: Diagnosis not present

## 2021-09-01 DIAGNOSIS — N179 Acute kidney failure, unspecified: Secondary | ICD-10-CM | POA: Diagnosis not present

## 2021-09-01 DIAGNOSIS — E7800 Pure hypercholesterolemia, unspecified: Secondary | ICD-10-CM

## 2021-09-01 DIAGNOSIS — I5043 Acute on chronic combined systolic (congestive) and diastolic (congestive) heart failure: Secondary | ICD-10-CM | POA: Diagnosis not present

## 2021-09-01 DIAGNOSIS — I480 Paroxysmal atrial fibrillation: Secondary | ICD-10-CM | POA: Diagnosis not present

## 2021-09-01 LAB — BASIC METABOLIC PANEL
Anion gap: 10 (ref 5–15)
BUN: 73 mg/dL — ABNORMAL HIGH (ref 8–23)
CO2: 28 mmol/L (ref 22–32)
Calcium: 8.9 mg/dL (ref 8.9–10.3)
Chloride: 99 mmol/L (ref 98–111)
Creatinine, Ser: 4.57 mg/dL — ABNORMAL HIGH (ref 0.61–1.24)
GFR, Estimated: 12 mL/min — ABNORMAL LOW (ref >60)
Glucose, Bld: 105 mg/dL — ABNORMAL HIGH (ref 70–99)
Potassium: 4.1 mmol/L (ref 3.5–5.1)
Sodium: 137 mmol/L (ref 135–145)

## 2021-09-01 LAB — CBC
HCT: 37.7 % — ABNORMAL LOW (ref 39.0–52.0)
Hemoglobin: 12.7 g/dL — ABNORMAL LOW (ref 13.0–17.0)
MCH: 27 pg (ref 26.0–34.0)
MCHC: 33.7 g/dL (ref 30.0–36.0)
MCV: 80 fL (ref 80.0–100.0)
Platelets: 136 10*3/uL — ABNORMAL LOW (ref 150–400)
RBC: 4.71 MIL/uL (ref 4.22–5.81)
RDW: 14.8 % (ref 11.5–15.5)
WBC: 8.6 10*3/uL (ref 4.0–10.5)
nRBC: 0 % (ref 0.0–0.2)

## 2021-09-01 LAB — COOXEMETRY PANEL
Carboxyhemoglobin: 2.1 % — ABNORMAL HIGH (ref 0.5–1.5)
Methemoglobin: <0.7 % (ref 0.0–1.5)
O2 Saturation: 85.9 %
Total hemoglobin: 12.4 g/dL (ref 12.0–16.0)

## 2021-09-01 LAB — MAGNESIUM: Magnesium: 1.8 mg/dL (ref 1.7–2.4)

## 2021-09-01 MED ORDER — MAGNESIUM SULFATE IN D5W 1-5 GM/100ML-% IV SOLN
1.0000 g | Freq: Once | INTRAVENOUS | Status: AC
  Administered 2021-09-01: 1 g via INTRAVENOUS
  Filled 2021-09-01: qty 100

## 2021-09-01 MED ORDER — MAGNESIUM SULFATE 2 GM/50ML IV SOLN
2.0000 g | Freq: Once | INTRAVENOUS | Status: AC
  Administered 2021-09-01: 2 g via INTRAVENOUS
  Filled 2021-09-01: qty 50

## 2021-09-01 NOTE — Progress Notes (Addendum)
?Progress Note ? ? ?Patient: Anthony Foster XIP:[id number] DOB: August 27, 1940 DOA: 08/24/2021     8 ?DOS: the patient was seen and examined on 09/01/2021 ?  ?Brief hospital course: ?[name] was admitted to the hospital with the working diagnosis of decompensated heart failure.  ? ?81 yo male with the past medical history of heart failure, atrial fibrillation, CKD stage IV who presented with edema. Reported worsening lower extremity edema, progressing to scrotal edema. He has not been compliant with his medications. On his initial physical examination his blood pressure was 102/78, HR 83, RR 14 and oxygen saturation 98%, lungs with decreased breath sounds, heart with S1 and S2 present irregularly irregular, with no gallops, abdomen protuberant and positive lower extremity edema.  ?Right upper extremity fistula in place.  ? ?Na 141, K 4.9 CL 114 bicarbonate 18, glucose 104 ,bun 51,cr 3,76  ?BNP 3,914 ?Wbc 9,6, hgb 13,7 hct 44,2 plt 130  ?Sars covid 19 negative  ?Urine analysis with sg 1,019 with > 50 rbc and 0-5 wbc  ? ?Ct renal with bilateral scrotal hydroceles, partial renal atrophy, no hydronephrosis, infrarenal aortic aneurysm 40 mm diameter.  ? ?Chest radiograph with cardiomegaly with no infiltrates. Mild hilar vascular congestion.  ? ?EKG 97 bpm, left axis deviation, left anterior fascicular block, with atrial fibrillation rhythm, with positive PVCs, no significant ST segment or T wave changes.  ? ?Patient was placed on diuretic therapy for volume overload.  ?Echocardiogram with reduced LV systolic function.  ? ?Right heart catheterization with low cardiac output.  ?Started on milrinone for inotropic support.  ? ? ?Assessment and Plan: ?* Acute on chronic combined systolic and diastolic CHF (congestive heart failure) ([place]) ?Echocardiogram with worsening LV systolic function down to 20 to 25%.  ? ?Improving edema,  ?Urine output is 4,350 ml over last 24 hrs ?Systolic blood pressure has been 100 range.   ? ?Responding to aggressive diuresis infusion.  ?Blood pressure support with midodrine and milrinone.  ? ?Not able to use ras inhibition due to risk of hypotension, and not on B blockade due to low output heart failure decompensation.  ? ? ? ?Acute kidney injury superimposed on chronic kidney disease ([place]) ?Hypomagnesemia  ?Edema continue to improve and patient with good urine output more than 4 L over last 24 hrs. ? ?Renal function with serum cr at 4,57 with K at 4,1 and serum bicarbonate at 28, mg 1,8 ?Plan to continue diuresis with furosemide and follow up renal function in am ?Avoid hypotension and nephrotoxic medications.  ?Add Mag sulfate IV  ? ?PAF (paroxysmal atrial fibrillation) (HCC) on chronic anticoagulation ?Heart rate has been controlled, continue anticoagulation with apixaban.  ?Transitory non sustained VT, continue telemetry monitoring.  ?Keep K at 4 and Mg at 2.  ? ?Hyperlipidemia ?Continue with atorvastatin.  ? ?Essential hypertension ?Blood pressure support with midodrine and IV milrinone.  ?Continue blood pressure monitoring.  ? ?AAA (abdominal aortic aneurysm) ?Patient has a known AAA last evaluated by ultrasound 05/2021 noted to be 4 cm at that time. ? ?Thrombocytopenia ([place]) ?Cell count has been stable with Plt at 136, hgb is 12,7and hct at 37,7.  ?Close follow up cell count.  ? ? ?Tobacco use ?Smoking cessation  ? ? ? ? ?  ? ?Subjective: patient is feeling better, dyspnea and edema are improving, no chest pain  ? ?Physical Exam: ?Vitals:  ? 08/31/21 1126 08/31/21 2001 09/01/21 0334 09/01/21 1120  ?BP: (!) 118/102 (!) 124/54 109/85 (!) 100/36  ?Pulse: 82  97 91 85  ?Resp: '20 20 20   '$ ?Temp: 97.9 ?F (36.6 ?C) 97.7 ?F (36.5 ?C) 97.8 ?F (36.6 ?C) 97.7 ?F (36.5 ?C)  ?TempSrc: Oral Oral Oral Oral  ?SpO2: 99% 96% 97% 98%  ?Weight:   73.3 kg   ?Height:      ? ?Neurology awake and alert ?ENT with no pallor ?Cardiovascular with S1 and S2 present and rhythmic with no gallops, positive systolic murmur at  the apex. ?No JVD ?Lower extremity edema + pitting bilaterally  ?Respiratory with scattered rales and no wheezing ?Abdomen soft and non tender  ?Data Reviewed: ? ? ? ?Family Communication: no family at the bedside  ? ?Disposition: ?Status is: Inpatient ?Remains inpatient appropriate because: heart failure  ? Planned Discharge Destination: Home ? ? ? ?Author: ?Tawni Millers, MD ?09/01/2021 3:32 PM ? ?For on call review [email or web address].  ?

## 2021-09-01 NOTE — Progress Notes (Signed)
[facility] ?ROUNDING NOTE  ? ?Subjective:  ? 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  History of paroxysmal atrial fibrillation and AICD placement.  History of COPD.  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  Creatinine 3.9 mg/dL when last noted.  Was evaluated for progression of renal disease.  Recommendations to continue diuretics.  Patient underwent right-sided heart catheterization, stent to high filling pressures with increased PA pressures.  Decision was made to try milrinone. ? ?Urine output 4.7 L 08/31/2021 ? ?Blood pressure 109/85 pulse 91 temperature 97.8 O2 sats 100% ? ?IV Lasix ? ?Sodium 137 potassium 4.1 chloride 99 CO2 28 BUN of 73 creatinine 4.57 glucose 105 hemoglobin 12.7 ? ?Objective:  ?Vital signs in last 24 hours:  ?Temp:  [97.7 ?F (36.5 ?C)-97.9 ?F (36.6 ?C)] 97.8 ?F (36.6 ?C) (03/11 0334) ?Pulse Rate:  [82-97] 91 (03/11 0334) ?Resp:  [20] 20 (03/11 0334) ?BP: (109-124)/(54-102) 109/85 (03/11 0334) ?SpO2:  [96 %-99 %] 97 % (03/11 0334) ?Weight:  [73.3 kg] 73.3 kg (03/11 0334) ? ?Weight change: -2.359 kg ?Filed Weights  ? 08/30/21 0441 08/31/21 0551 09/01/21 0334  ?Weight: 79.5 kg 75.7 kg 73.3 kg  ? ? ?Intake/Output: ?I/O last 3 completed shifts: ?In: 2459.1 [P.O.:1420; I.V.:195.1; IV Piggyback:844] ?Out: 6250 [Urine:6250] ?  ?Intake/Output this shift: ? Total I/O ?In: 240 [P.O.:240] ?Out: -  ? ?CVS- RRR ?RS- CTA ?ABD- BS present soft non-distended ?EXT- lower extremity edema ? ? ?Basic Metabolic Panel: ?Recent Labs  ?Lab 08/27/21 ?3532 08/28/21 ?0322 08/28/21 ?1233 08/29/21 ?0507 08/30/21 ?0444 08/31/21 ?9924 09/01/21 ?0154  ?NA 138 138 138  138 137 137 140 137  ?K 5.1 4.7 4.6  4.6 4.2 4.1 4.0 4.1  ?CL 108 107  --  106 104 103 99  ?CO2 18* 18*  --  '22 24 27 28  '$ ?GLUCOSE 86 96  --  97 109* 79 105*  ?BUN 64* 72*   --  72* 74* 73* 73*  ?CREATININE 4.63* 4.78*  --  4.68* 4.86* 4.51* 4.57*  ?CALCIUM 9.1 9.2  --  9.1 9.0 9.1 8.9  ?MG 1.8  --   --   --  2.2 2.1 1.8  ? ? ? ?Liver Function Tests: ?No results for input(s): AST, ALT, ALKPHOS, BILITOT, PROT, ALBUMIN in the last 168 hours. ? ?No results for input(s): LIPASE, AMYLASE in the last 168 hours. ?No results for input(s): AMMONIA in the last 168 hours. ? ?CBC: ?Recent Labs  ?Lab 08/27/21 ?2683 08/28/21 ?1233 08/30/21 ?0444 09/01/21 ?0154  ?WBC 10.2  --  8.3 8.6  ?HGB 13.3 14.6  14.6 12.1* 12.7*  ?HCT 40.8 43.0  43.0 35.9* 37.7*  ?MCV 83.3  --  80.7 80.0  ?PLT 124*  --  133* 136*  ? ? ? ?Cardiac Enzymes: ?No results for input(s): CKTOTAL, CKMB, CKMBINDEX, TROPONINI in the last 168 hours. ? ?BNP: ?Invalid input(s): POCBNP ? ?CBG: ?No results for input(s): GLUCAP in the last 168 hours. ? ?Microbiology: ?Results for orders placed or performed during the hospital encounter of 08/24/21  ?Resp Panel by RT-PCR (Flu A&B, Covid) Nasopharyngeal Swab     Status: None  ? Collection Time: 08/24/21  6:07 AM  ? Specimen: Nasopharyngeal Swab; Nasopharyngeal(NP) swabs in vial transport medium  ?Result Value Ref Range Status  ? SARS Coronavirus 2 by RT PCR NEGATIVE NEGATIVE Final  ?  Comment: (NOTE) ?SARS-CoV-2 target nucleic acids are NOT DETECTED. ? ?The SARS-CoV-2 RNA is generally detectable in upper respiratory ?specimens during the acute phase of infection. The lowest ?concentration of SARS-CoV-2 viral copies this assay can detect is ?138 copies/mL. A negative result does not preclude SARS-Cov-2 ?infection and should not be used as the sole basis for treatment or ?other patient management decisions. A negative result may occur with  ?improper specimen collection/handling, submission of specimen other ?than nasopharyngeal swab, presence of viral mutation(s) within the ?areas targeted by this assay, and inadequate number of viral ?copies(<138 copies/mL). A negative result must be combined  with ?clinical observations, patient history, and epidemiological ?information. The expected result is Negative. ? ?Fact Sheet for Patients:  ?EntrepreneurPulse.com.au ? ?Fact Sheet for Healthcare Providers:  ?IncredibleEmployment.be ? ?This test is no t yet approved or cleared by the [place] FDA and  ?has been authorized for detection and/or diagnosis of SARS-CoV-2 by ?FDA under an Emergency Use Authorization (EUA). This EUA will remain  ?in effect (meaning this test can be used) for the duration of the ?COVID-19 declaration under Section 564(b)(1) of the Act, 21 ?U.S.C.section 360bbb-3(b)(1), unless the authorization is terminated  ?or revoked sooner.  ? ? ?  ? Influenza A by PCR NEGATIVE NEGATIVE Final  ? Influenza B by PCR NEGATIVE NEGATIVE Final  ?  Comment: (NOTE) ?The Xpert Xpress SARS-CoV-2/FLU/RSV plus assay is intended as an aid ?in the diagnosis of influenza from Nasopharyngeal swab specimens and ?should not be used as a sole basis for treatment. Nasal washings and ?aspirates are unacceptable for Xpert Xpress SARS-CoV-2/FLU/RSV ?testing. ? ?Fact Sheet for Patients: ?EntrepreneurPulse.com.au ? ?Fact Sheet for Healthcare Providers: ?IncredibleEmployment.be ? ?This test is not yet approved or cleared by the [place] FDA and ?has been authorized for detection and/or diagnosis of SARS-CoV-2 by ?FDA under an Emergency Use Authorization (EUA). This EUA will remain ?in effect (meaning this test can be used) for the duration of the ?COVID-19 declaration under Section 564(b)(1) of the Act, 21 U.S.C. ?section 360bbb-3(b)(1), unless the authorization is terminated or ?revoked. ? ?Performed at [facility], [address] [place], [place] ?[address] ?  ? ? ?Coagulation Studies: ?No results for input(s): LABPROT, INR in the last 72 hours. ? ?Urinalysis: ?No results for input(s): COLORURINE, LABSPEC, [place], GLUCOSEU, HGBUR,  BILIRUBINUR, KETONESUR, PROTEINUR, UROBILINOGEN, NITRITE, LEUKOCYTESUR in the last 72 hours. ? ?Invalid input(s): APPERANCEUR  ? ? ?Imaging: ?No results found. ? ? ?Medications:  ? ? sodium chloride    ? sodium chloride    ? furosemide 160 mg (09/01/21 0548)  ? milrinone 0.125 mcg/kg/min (08/31/21 1202)  ? ? apixaban  2.5 mg Oral BID  ? atorvastatin  80 mg Oral Daily  ? Chlorhexidine Gluconate Cloth  6 each Topical Daily  ? ezetimibe  10 mg Oral Daily  ? midodrine  10 mg Oral BID WC  ? sodium chloride flush  3 mL Intravenous Q12H  ? sodium chloride flush  3 mL Intravenous Q12H  ? ?sodium chloride, sodium chloride, acetaminophen, ondansetron (ZOFRAN) IV, oxyCODONE-acetaminophen, sodium chloride flush, sodium chloride flush ? ?Assessment/ Plan:  ?Chronic kidney disease stage IV/V.  Does not appear to be in any need of urgent dialysis at this point.  Hypotension may be a limiting factor.  Patient has already been started on milrinone.  Palliative medicine involved.  I believe proceeding with hospice and palliative care at home would be appropriate. ?ANEMIA-stable. ?MBD-stable ?HTN/VOL-requires midodrine.  Continuing on IV Lasix for diuresis.  Responding with tremendous diuresis over 4 L diuresis.  Midodrine for blood pressure support. ?ACCESS-has AV fistula in place although patient has voiced a desire not to proceed with dialysis and continue palliative treatment ? ? ? LOS: 8 ?Sherril Croon ?'@TODAY''@9'$ :37 AM ?  ?

## 2021-09-01 NOTE — Progress Notes (Signed)
? ? Advanced Heart Failure Rounding Note ? ?PCP-Cardiologist: Kirk Ruths, MD  ? ?Subjective:   ? ?08/28/21- RHC with biventricular failure, marked volume overload, low CO by [place] (thermo ok), and mixed pulmonary HTN.  Started on milrinone 0.125 mcg.  ? ?Co-ox 86% on 0.125 milrinone CVP 13-14 ? ?Diuresing well with high dose IV lasix weight down 5 pounds overnight 22 pound total ? ?Scr 4.68>4.86>4.51> 4.57 ? ?Denies SOB, orthopnea or PND.  ? ?Had 32 beat NSVT overnight.  K 4.1 Mg 1.8   ? ? ? ?Objective:   ?Weight Range: ?73.3 kg ?Body mass index is 24.59 kg/m?.  ? ?Vital Signs:   ?Temp:  [97.7 ?F (36.5 ?C)-97.9 ?F (36.6 ?C)] 97.8 ?F (36.6 ?C) (03/11 0334) ?Pulse Rate:  [82-97] 91 (03/11 0334) ?Resp:  [20] 20 (03/11 0334) ?BP: (109-124)/(54-102) 109/85 (03/11 0334) ?SpO2:  [96 %-99 %] 97 % (03/11 0334) ?Weight:  [73.3 kg] 73.3 kg (03/11 0334) ?Last BM Date : 08/31/21 ? ?Weight change: ?Filed Weights  ? 08/30/21 0441 08/31/21 0551 09/01/21 0334  ?Weight: 79.5 kg 75.7 kg 73.3 kg  ? ? ?Intake/Output:  ? ?Intake/Output Summary (Last 24 hours) at 09/01/2021 1042 ?Last data filed at 09/01/2021 0851 ?Gross per 24 hour  ?Intake 1863.7 ml  ?Output 4350 ml  ?Net -2486.3 ml  ? ?  ? ? ?Physical Exam  ?General:  Elderly male, lying comfortably in bed. ? ?HEENT: normal ?Neck: supple. JVP to jaw  Carotids 2+ bilat; no bruits. No lymphadenopathy or thryomegaly appreciated. ?Cor: PMI nondisplaced. Irregular rate & rhythm. No rubs, gallops or murmurs. ?Lungs: clear ?Abdomen: soft, nontender, nondistended. No hepatosplenomegaly. No bruits or masses. Good bowel sounds. ?Extremities: no cyanosis, clubbing, rash, 1-2+ edema ?Neuro: alert & orientedx3, cranial nerves grossly intact. moves all 4 extremities w/o difficulty. Affect pleasan ? ? ?Telemetry  ? ?AF 80s, + PVCs 32 beat run NSVT Personally reviewed ? ? ?Labs  ?  ?CBC ?Recent Labs  ?  08/30/21 ?0444 09/01/21 ?0154  ?WBC 8.3 8.6  ?HGB 12.1* 12.7*  ?HCT 35.9* 37.7*  ?MCV 80.7 80.0   ?PLT 133* 136*  ? ? ?Basic Metabolic Panel ?Recent Labs  ?  08/31/21 ?6160 09/01/21 ?0154  ?NA 140 137  ?K 4.0 4.1  ?CL 103 99  ?CO2 27 28  ?GLUCOSE 79 105*  ?BUN 73* 73*  ?CREATININE 4.51* 4.57*  ?CALCIUM 9.1 8.9  ?MG 2.1 1.8  ? ? ?Liver Function Tests ?No results for input(s): AST, ALT, ALKPHOS, BILITOT, PROT, ALBUMIN in the last 72 hours. ?No results for input(s): LIPASE, AMYLASE in the last 72 hours. ?Cardiac Enzymes ?No results for input(s): CKTOTAL, CKMB, CKMBINDEX, TROPONINI in the last 72 hours. ? ?BNP: ?BNP (last 3 results) ?Recent Labs  ?  08/24/21 ?0145  ?BNP 3,914.4*  ? ? ? ?ProBNP (last 3 results) ?No results for input(s): PROBNP in the last 8760 hours. ? ? ?D-Dimer ?No results for input(s): DDIMER in the last 72 hours. ?Hemoglobin A1C ?No results for input(s): HGBA1C in the last 72 hours. ?Fasting Lipid Panel ?No results for input(s): CHOL, HDL, LDLCALC, TRIG, CHOLHDL, LDLDIRECT in the last 72 hours. ?Thyroid Function Tests ?No results for input(s): TSH, T4TOTAL, T3FREE, THYROIDAB in the last 72 hours. ? ?Invalid input(s): FREET3 ? ?Other results: ? ? ?Imaging  ? ? ?No results found. ? ? ?Medications:   ? ? ?Scheduled Medications: ? apixaban  2.5 mg Oral BID  ? atorvastatin  80 mg Oral Daily  ? Chlorhexidine Gluconate Cloth  6 each Topical Daily  ? ezetimibe  10 mg Oral Daily  ? midodrine  10 mg Oral BID WC  ? sodium chloride flush  3 mL Intravenous Q12H  ? sodium chloride flush  3 mL Intravenous Q12H  ? ? ?Infusions: ? sodium chloride    ? sodium chloride    ? furosemide 160 mg (09/01/21 0548)  ? milrinone 0.125 mcg/kg/min (08/31/21 1202)  ? ? ?PRN Medications: ?sodium chloride, sodium chloride, acetaminophen, ondansetron (ZOFRAN) IV, oxyCODONE-acetaminophen, sodium chloride flush, sodium chloride flush ? ? ? ?Patient Profile  ? ?  ?81 y/o male with CKD IV, CAD with ischemic CM. PAF ?  ?Admitted with worsening HF in setting of non-compliance. Echo with EF 35%-> 20-25%. Poor response to IV lasix with  persistent fluid overload and worsening AKI ? ?Assessment/Plan  ?Acute on chronic systolic CHF ?-EF previously 35-40% on echo 2014 and 2017 ?-Hx ICD 2014 ?-Felt to be ICM. Had MI and stent in 2012 per chart review (no prior cath report available). Prior care in [place].  ?- Echo this admit with EF 20-25%, RV low normal, PASP 50 mmHg, mild MR, moderate TR ?- Admitted with a/c CHF in setting of noncompliance with medications. Had been off his medications for at least 3 months and restarted 2-3 weeks ago. Not sure how much recurrent AF contributing. Had < 1% burden on device check in January. In AF on admit. BNP > 3,900 on admit ?- RHC marked volume overload, low CO by thermo and mixed pulmonary HTN. Started on milrinone 0.125 mcg. Will continue milrinone to augment diuresis then stop. Not ideal for home inotrope. ?- CVP 13-14 . Still appears volume up. Continue high dose IV lasix 160 mg q 8 hrs for today. Likely needs another 1-2 days of diuresis. ?-No BB with low-output HF ?-GDMT limited by hypotension and AKI on CKD ?-On midodrine 10 mg TID for BP support ?-TED hose ?  ?2. CAD ?- Hx MI and stent in 2012 per chart review ?- No s/s angina  ?- On atorvastatin 80 + zetia ?- No aspirin d/t anticoagulation ?  ?3. Atrial fibrillation ?-1% burden on last device check in January ?-in AF this admit, rate controlled.   ?- Suspect this is related to decompensation . Will start amio 200 bid.  ?- Consider DC-CV prior to discharge if he is amenable ?-TSH okay ?-On eliquis 2.5 mg BID ?  ?4. AKI on CKD IV ?- Scr baseline 3.2, up to 4.8 with attempts to diurese. Scr stable at 4.5 today. ?- Has AV fistula in place.  ?- Nephrology following. Patient does not want HD. ?- Continue midodrine for BP support ?  ?5. Thrombocytopenia ?- Platelets 130>124>133  ?- Monitor ?  ?6. AAA ?- 4.0 cm on CT  ?  ?7. Tobacco use ?- Smoking cessation recommended ? ?8. NSVT ?- may be related to milrinone ?- keep K. 4.0 Mg >2.0 ?- Supp mag today ? ?9.  [place] ?-Palliative Care consulted, appreciate assistance ?-Remains full code. Family continuing [place] discussions. Palliative planning to meet with family again on 03/13. ?-He does not want HD ?-Not a candidate for home milrinone ? ? ? ?Length of Stay: 8 ? ?Glori Bickers, MD  ?09/01/2021, 10:42 AM ? ?Advanced Heart Failure Team ?Pager [phone] (M-F; 7a - 5p)  ?Please contact [facility] for night-coverage after hours (5p -7a ) and weekends on [email or web address] ? ? ? ? ?

## 2021-09-01 NOTE — Progress Notes (Signed)
On call hospitalist notified of a 34 beat run of vtach. This run occurred while patient was sleeping; patient reports surprise at learning of this. Labs drawn. 12 lead EKG obtained, results pending interpretation. ? ?On call cardiologist notified as well. No response as of time of writing. ?

## 2021-09-01 NOTE — Progress Notes (Signed)
HOSPITAL MEDICINE OVERNIGHT EVENT NOTE   ? ?Notified by nursing that patient seemed to exhibit a 34 beat run of ventricular tachycardia.  I reviewed the strip and confirmed this finding. ? ?Nursing reports the patient was asymptomatic at the time of the event.  Patient is hemodynamically stable. ? ?As a result we performed the patient's morning labs early including a chemistry and magnesium.  Potassium is noted to be normal with magnesium of 1.8.  Will provide patient with 2 g of intravenous magnesium sulfate. ? ?Since cardiology is already consulted and following this patient nursing is attempting to contact the on-call fellow to notify them of this finding although at this point I do not believe that any further treatment or intervention is necessary. ? ?Vernelle Emerald  MD ?[facility]  ? ? ? ? ? ? ? ? ? ? ?

## 2021-09-01 NOTE — Progress Notes (Signed)
PT Cancellation Note ? ?Patient Details ?Name: Gaige Sebo Benfer ?MRN: [id number] ?DOB: [birth year] ? ? ?Cancelled Treatment:    Reason Eval/Treat Not Completed: Other (comment) (Pt reported he had already been up amb) ? ? ?Shary Decamp [facility] ?09/01/2021, 3:15 PM ?Suanne Marker PT ?Acute Rehabilitation Services ?Pager [phone] ?Office [phone] ? ?

## 2021-09-02 DIAGNOSIS — I10 Essential (primary) hypertension: Secondary | ICD-10-CM | POA: Diagnosis not present

## 2021-09-02 DIAGNOSIS — I5043 Acute on chronic combined systolic (congestive) and diastolic (congestive) heart failure: Secondary | ICD-10-CM | POA: Diagnosis not present

## 2021-09-02 DIAGNOSIS — I7143 Infrarenal abdominal aortic aneurysm, without rupture: Secondary | ICD-10-CM | POA: Diagnosis not present

## 2021-09-02 DIAGNOSIS — E7800 Pure hypercholesterolemia, unspecified: Secondary | ICD-10-CM | POA: Diagnosis not present

## 2021-09-02 DIAGNOSIS — Z720 Tobacco use: Secondary | ICD-10-CM

## 2021-09-02 LAB — BASIC METABOLIC PANEL
Anion gap: 10 (ref 5–15)
BUN: 73 mg/dL — ABNORMAL HIGH (ref 8–23)
CO2: 31 mmol/L (ref 22–32)
Calcium: 9.1 mg/dL (ref 8.9–10.3)
Chloride: 96 mmol/L — ABNORMAL LOW (ref 98–111)
Creatinine, Ser: 4.29 mg/dL — ABNORMAL HIGH (ref 0.61–1.24)
GFR, Estimated: 13 mL/min — ABNORMAL LOW (ref >60)
Glucose, Bld: 94 mg/dL (ref 70–99)
Potassium: 4.1 mmol/L (ref 3.5–5.1)
Sodium: 137 mmol/L (ref 135–145)

## 2021-09-02 LAB — COOXEMETRY PANEL
Carboxyhemoglobin: 2 % — ABNORMAL HIGH (ref 0.5–1.5)
Methemoglobin: 1 % (ref 0.0–1.5)
O2 Saturation: 81 %
Total hemoglobin: 13.7 g/dL (ref 12.0–16.0)

## 2021-09-02 LAB — MAGNESIUM: Magnesium: 2.6 mg/dL — ABNORMAL HIGH (ref 1.7–2.4)

## 2021-09-02 MED ORDER — AMIODARONE HCL 200 MG PO TABS
200.0000 mg | ORAL_TABLET | Freq: Two times a day (BID) | ORAL | Status: DC
  Administered 2021-09-02 – 2021-09-07 (×11): 200 mg via ORAL
  Filled 2021-09-02 (×11): qty 1

## 2021-09-02 MED ORDER — TORSEMIDE 20 MG PO TABS
40.0000 mg | ORAL_TABLET | Freq: Two times a day (BID) | ORAL | Status: DC
  Filled 2021-09-02: qty 2

## 2021-09-02 MED ORDER — TORSEMIDE 20 MG PO TABS
80.0000 mg | ORAL_TABLET | Freq: Two times a day (BID) | ORAL | Status: DC
  Administered 2021-09-02 – 2021-09-04 (×4): 80 mg via ORAL
  Filled 2021-09-02 (×4): qty 4

## 2021-09-02 NOTE — Progress Notes (Addendum)
[facility] ?ROUNDING NOTE  ? ?Subjective:  ? 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  History of paroxysmal atrial fibrillation and AICD placement.  History of COPD.  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  Creatinine 3.9 mg/dL when last noted.  Was evaluated for progression of renal disease.  Recommendations to continue diuretics.  Patient underwent right-sided heart catheterization, stent to high filling pressures with increased PA pressures.    ? ?Urine output 3.8 L 09/01/2021 ? ?Blood pressure 193/67 pulse 93 temperature 98.5 O2 sats 98% ? ?IV Lasix ?IV milrinone ? ?Sodium 137 potassium 4.1 chloride 96 CO2 31 BUN 73 creatinine 4.29 glucose 94 calcium 9.1 hemoglobin 12.7 ? ?Objective:  ?Vital signs in last 24 hours:  ?Temp:  [97.7 ?F (36.5 ?C)-98.5 ?F (36.9 ?C)] 98.5 ?F (36.9 ?C) (03/12 5638) ?Pulse Rate:  [78-98] 98 (03/12 0807) ?Resp:  [20] 20 (03/12 0807) ?BP: (93-112)/(36-67) 93/67 (03/12 7564) ?SpO2:  [95 %-98 %] 98 % (03/12 0807) ?Weight:  [71 kg] 71 kg (03/12 0400) ? ?Weight change: -2.313 kg ?Filed Weights  ? 08/31/21 0551 09/01/21 0334 09/02/21 0400  ?Weight: 75.7 kg 73.3 kg 71 kg  ? ? ?Intake/Output: ?I/O last 3 completed shifts: ?In: 2214.9 [P.O.:1690; I.V.:124.5; IV Piggyback:400.4] ?Out: 5850 [Urine:5850] ?  ?Intake/Output this shift: ? Total I/O ?In: 240 [P.O.:240] ?Out: -  ? ?CVS- RRR ?RS- CTA ?ABD- BS present soft non-distended ?EXT- lower extremity edema ? ? ?Basic Metabolic Panel: ?Recent Labs  ?Lab 08/27/21 ?3329 08/28/21 ?0322 08/29/21 ?0507 08/30/21 ?0444 08/31/21 ?5188 09/01/21 ?0154 09/02/21 ?0410  ?NA 138   < > 137 137 140 137 137  ?K 5.1   < > 4.2 4.1 4.0 4.1 4.1  ?CL 108   < > 106 104 103 99 96*  ?CO2 18*   < > '22 24 27 28 31  '$ ?GLUCOSE 86   < > 97 109* 79 105* 94  ?BUN 64*   < > 72* 74* 73* 73* 73*   ?CREATININE 4.63*   < > 4.68* 4.86* 4.51* 4.57* 4.29*  ?CALCIUM 9.1   < > 9.1 9.0 9.1 8.9 9.1  ?MG 1.8  --   --  2.2 2.1 1.8 2.6*  ? < > = values in this interval not displayed.  ? ? ? ?Liver Function Tests: ?No results for input(s): AST, ALT, ALKPHOS, BILITOT, PROT, ALBUMIN in the last 168 hours. ? ?No results for input(s): LIPASE, AMYLASE in the last 168 hours. ?No results for input(s): AMMONIA in the last 168 hours. ? ?CBC: ?Recent Labs  ?Lab 08/27/21 ?4166 08/28/21 ?1233 08/30/21 ?0444 09/01/21 ?0154  ?WBC 10.2  --  8.3 8.6  ?HGB 13.3 14.6  14.6 12.1* 12.7*  ?HCT 40.8 43.0  43.0 35.9* 37.7*  ?MCV 83.3  --  80.7 80.0  ?PLT 124*  --  133* 136*  ? ? ? ?Cardiac Enzymes: ?No results for input(s): CKTOTAL, CKMB, CKMBINDEX, TROPONINI in the last 168 hours. ? ?BNP: ?Invalid input(s): POCBNP ? ?CBG: ?No results for input(s): GLUCAP in the last 168 hours. ? ?Microbiology: ?Results for orders placed or performed during the hospital encounter of 08/24/21  ?Resp Panel by RT-PCR (Flu A&B, Covid) Nasopharyngeal Swab     Status: None  ? Collection Time: 08/24/21  6:07 AM  ? Specimen: Nasopharyngeal Swab; Nasopharyngeal(NP) swabs in vial transport medium  ?  Result Value Ref Range Status  ? SARS Coronavirus 2 by RT PCR NEGATIVE NEGATIVE Final  ?  Comment: (NOTE) ?SARS-CoV-2 target nucleic acids are NOT DETECTED. ? ?The SARS-CoV-2 RNA is generally detectable in upper respiratory ?specimens during the acute phase of infection. The lowest ?concentration of SARS-CoV-2 viral copies this assay can detect is ?138 copies/mL. A negative result does not preclude SARS-Cov-2 ?infection and should not be used as the sole basis for treatment or ?other patient management decisions. A negative result may occur with  ?improper specimen collection/handling, submission of specimen other ?than nasopharyngeal swab, presence of viral mutation(s) within the ?areas targeted by this assay, and inadequate number of viral ?copies(<138 copies/mL). A  negative result must be combined with ?clinical observations, patient history, and epidemiological ?information. The expected result is Negative. ? ?Fact Sheet for Patients:  ?EntrepreneurPulse.com.au ? ?Fact Sheet for Healthcare Providers:  ?IncredibleEmployment.be ? ?This test is no t yet approved or cleared by the [place] FDA and  ?has been authorized for detection and/or diagnosis of SARS-CoV-2 by ?FDA under an Emergency Use Authorization (EUA). This EUA will remain  ?in effect (meaning this test can be used) for the duration of the ?COVID-19 declaration under Section 564(b)(1) of the Act, 21 ?U.S.C.section 360bbb-3(b)(1), unless the authorization is terminated  ?or revoked sooner.  ? ? ?  ? Influenza A by PCR NEGATIVE NEGATIVE Final  ? Influenza B by PCR NEGATIVE NEGATIVE Final  ?  Comment: (NOTE) ?The Xpert Xpress SARS-CoV-2/FLU/RSV plus assay is intended as an aid ?in the diagnosis of influenza from Nasopharyngeal swab specimens and ?should not be used as a sole basis for treatment. Nasal washings and ?aspirates are unacceptable for Xpert Xpress SARS-CoV-2/FLU/RSV ?testing. ? ?Fact Sheet for Patients: ?EntrepreneurPulse.com.au ? ?Fact Sheet for Healthcare Providers: ?IncredibleEmployment.be ? ?This test is not yet approved or cleared by the [place] FDA and ?has been authorized for detection and/or diagnosis of SARS-CoV-2 by ?FDA under an Emergency Use Authorization (EUA). This EUA will remain ?in effect (meaning this test can be used) for the duration of the ?COVID-19 declaration under Section 564(b)(1) of the Act, 21 U.S.C. ?section 360bbb-3(b)(1), unless the authorization is terminated or ?revoked. ? ?Performed at [facility], [place] [address] [place], [place] ?[address] ?  ? ? ?Coagulation Studies: ?No results for input(s): LABPROT, INR in the last 72 hours. ? ?Urinalysis: ?No results for input(s): COLORURINE, LABSPEC,  [place], GLUCOSEU, HGBUR, BILIRUBINUR, KETONESUR, PROTEINUR, UROBILINOGEN, NITRITE, LEUKOCYTESUR in the last 72 hours. ? ?Invalid input(s): APPERANCEUR  ? ? ?Imaging: ?No results found. ? ? ?Medications:  ? ? sodium chloride    ? sodium chloride    ? furosemide 160 mg (09/02/21 0543)  ? milrinone 0.125 mcg/kg/min (09/01/21 1839)  ? ? apixaban  2.5 mg Oral BID  ? atorvastatin  80 mg Oral Daily  ? Chlorhexidine Gluconate Cloth  6 each Topical Daily  ? ezetimibe  10 mg Oral Daily  ? midodrine  10 mg Oral BID WC  ? sodium chloride flush  3 mL Intravenous Q12H  ? sodium chloride flush  3 mL Intravenous Q12H  ? ?sodium chloride, sodium chloride, acetaminophen, ondansetron (ZOFRAN) IV, oxyCODONE-acetaminophen, sodium chloride flush, sodium chloride flush ? ?Assessment/ Plan:  ?Chronic kidney disease stage IV/V.  Does not appear to be in any need of urgent dialysis at this point.  Hypotension may be a limiting factor.  Patient has already been started on milrinone.  Palliative medicine involved.  I believe proceeding with hospice  and palliative care at home would be appropriate. ?ANEMIA-stable. ?MBD-stable ?HTN/VOL-requires midodrine.  Will transition to oral torsemide 40 mg BID.  Responding with tremendous diuresis    Midodrine for blood pressure support. ?ACCESS-has AV fistula in place although patient has voiced a desire not to proceed with dialysis and continue palliative treatment ? ? ? LOS: 9 ?Sherril Croon ?'@TODAY''@9'$ :28 AM ?  ?

## 2021-09-02 NOTE — Progress Notes (Signed)
? ? Advanced Heart Failure Rounding Note ? ?PCP-Cardiologist: Kirk Ruths, MD  ? ?Subjective:   ? ?08/28/21- RHC with biventricular failure, marked volume overload, low CO by Fick (thermo ok), and mixed pulmonary HTN.  Started on milrinone 0.125 mcg.  ? ?Co-ox 81% on 0.125 milrinone CVP   ? ?Diuresing well with high dose IV lasix weight down another 5 pounds overnight, Now down 28 pound total ? ?Scr 4.68>4.86>4.51> 4.57>4.29 ? ?Denies SOB, orthopnea or PND  ? ?12 beat run NSVT on tele.  ? ? ?Objective:   ?Weight Range: ?71 kg ?Body mass index is 23.81 kg/m?.  ? ?Vital Signs:   ?Temp:  [97.7 ?F (36.5 ?C)-98.5 ?F (36.9 ?C)] 98.5 ?F (36.9 ?C) (03/12 6834) ?Pulse Rate:  [78-98] 98 (03/12 0807) ?Resp:  [20] 20 (03/12 0807) ?BP: (93-112)/(36-67) 93/67 (03/12 1962) ?SpO2:  [95 %-98 %] 98 % (03/12 0807) ?Weight:  [71 kg] 71 kg (03/12 0400) ?Last BM Date : 08/31/21 ? ?Weight change: ?Filed Weights  ? 08/31/21 0551 09/01/21 0334 09/02/21 0400  ?Weight: 75.7 kg 73.3 kg 71 kg  ? ? ?Intake/Output:  ? ?Intake/Output Summary (Last 24 hours) at 09/02/2021 1044 ?Last data filed at 09/02/2021 2297 ?Gross per 24 hour  ?Intake 1352.19 ml  ?Output 3550 ml  ?Net -2197.81 ml  ? ?  ? ? ?Physical Exam  ? ?General:  Sitting up in bed . No resp difficulty ?HEENT: normal ?Neck: supple. JVP 8-9 Carotids 2+ bilat; no bruits. No lymphadenopathy or thryomegaly appreciated. ?Cor: PMI nondisplaced. Irregular rate & rhythm. No rubs, gallops or murmurs. ?Lungs: clear ?Abdomen: soft, nontender, nondistended. No hepatosplenomegaly. No bruits or masses. Good bowel sounds. ?Extremities: no cyanosis, clubbing, rash, tNo edema ?Neuro: alert & orientedx3, cranial nerves grossly intact. moves all 4 extremities w/o difficulty. Affect pleasant ? ? ? ?Telemetry  ? ?AF 80-90s, 12 beat NSVT PVC 25-30/min Personally reviewed ? ? ?Labs  ?  ?CBC ?Recent Labs  ?  09/01/21 ?0154  ?WBC 8.6  ?HGB 12.7*  ?HCT 37.7*  ?MCV 80.0  ?PLT 136*  ? ? ?Basic Metabolic  Panel ?Recent Labs  ?  09/01/21 ?0154 09/02/21 ?0410  ?NA 137 137  ?K 4.1 4.1  ?CL 99 96*  ?CO2 28 31  ?GLUCOSE 105* 94  ?BUN 73* 73*  ?CREATININE 4.57* 4.29*  ?CALCIUM 8.9 9.1  ?MG 1.8 2.6*  ? ? ?Liver Function Tests ?No results for input(s): AST, ALT, ALKPHOS, BILITOT, PROT, ALBUMIN in the last 72 hours. ?No results for input(s): LIPASE, AMYLASE in the last 72 hours. ?Cardiac Enzymes ?No results for input(s): CKTOTAL, CKMB, CKMBINDEX, TROPONINI in the last 72 hours. ? ?BNP: ?BNP (last 3 results) ?Recent Labs  ?  08/24/21 ?0145  ?BNP 3,914.4*  ? ? ? ?ProBNP (last 3 results) ?No results for input(s): PROBNP in the last 8760 hours. ? ? ?D-Dimer ?No results for input(s): DDIMER in the last 72 hours. ?Hemoglobin A1C ?No results for input(s): HGBA1C in the last 72 hours. ?Fasting Lipid Panel ?No results for input(s): CHOL, HDL, LDLCALC, TRIG, CHOLHDL, LDLDIRECT in the last 72 hours. ?Thyroid Function Tests ?No results for input(s): TSH, T4TOTAL, T3FREE, THYROIDAB in the last 72 hours. ? ?Invalid input(s): FREET3 ? ?Other results: ? ? ?Imaging  ? ? ?No results found. ? ? ?Medications:   ? ? ?Scheduled Medications: ? apixaban  2.5 mg Oral BID  ? atorvastatin  80 mg Oral Daily  ? Chlorhexidine Gluconate Cloth  6 each Topical Daily  ? ezetimibe  10  mg Oral Daily  ? midodrine  10 mg Oral BID WC  ? sodium chloride flush  3 mL Intravenous Q12H  ? sodium chloride flush  3 mL Intravenous Q12H  ? ? ?Infusions: ? sodium chloride    ? sodium chloride    ? furosemide 160 mg (09/02/21 0543)  ? milrinone 0.125 mcg/kg/min (09/01/21 1839)  ? ? ?PRN Medications: ?sodium chloride, sodium chloride, acetaminophen, ondansetron (ZOFRAN) IV, oxyCODONE-acetaminophen, sodium chloride flush, sodium chloride flush ? ? ? ?Patient Profile  ? ?  ?81 y/o male with CKD IV, CAD with ischemic CM. PAF ?  ?Admitted with worsening HF in setting of non-compliance. Echo with EF 35%-> 20-25%. Poor response to IV lasix with persistent fluid overload and  worsening AKI ? ?Assessment/Plan  ?Acute on chronic systolic CHF ?-EF previously 35-40% on echo 2014 and 2017 ?-Hx ICD 2014 ?-Felt to be ICM. Had MI and stent in 2012 per chart review (no prior cath report available). Prior care in [place].  ?- Echo this admit with EF 20-25%, RV low normal, PASP 50 mmHg, mild MR, moderate TR ?- Admitted with a/c CHF in setting of noncompliance with medications. Had been off his medications for at least 3 months and restarted 2-3 weeks ago. Not sure how much recurrent AF contributing. Had < 1% burden on device check in January. In AF on admit. BNP > 3,900 on admit ?- RHC marked volume overload, low CO by thermo and mixed pulmonary HTN. Started on milrinone 0.125 mcg. Will continue milrinone to augment diuresis then stop. Not ideal for home inotrope. ?- CVP 8-9. This is probably as good as we are going to get him. Stop IV lasix. Transition to po diuretics. Will stop milrinone in am. D/w Dr. Justin Mend ?-No BB with low-output HF ?-GDMT limited by hypotension and AKI on CKD ?-On midodrine 10 mg TID for BP support ?-TED hose ?  ?2. CAD ?- Hx MI and stent in 2012 per chart review ?- No s/s angina ?- On atorvastatin 80 + zetia ?- No aspirin d/t anticoagulation ?  ?3. Atrial fibrillation. paroxysmal ?-1% burden on last device check in January ?-in AF this admit, rate controlled.   ?- Suspect this is related to decompensation . Continue amio 200 bid.  ?- Consider DC-CV prior to discharge if he is amenable. [place] Tuesday ?-TSH okay ?-On eliquis 2.5 mg BID. No bleeding ?  ?4. AKI on CKD IV ?- Scr baseline 3.2, up to 4.8 with attempts to diurese. Scr stable at 4.5 -> 4.3 today. ?- Has AV fistula in place.  ?- Nephrology following. Patient does not want HD. ?- Continue midodrine for BP support ?  ?5. Thrombocytopenia ?- Platelets stable ~136 ?- Monitor ?  ?6. AAA ?- 4.0 cm on CT  ?  ?7. Tobacco use ?- Smoking cessation recommended ? ?8. NSVT/PVCs ?- may be related to milrinone ?- keep K. 4.0 Mg  >2.0 ?- PVC burden 25-30/min -> improved with amio  ?-  On amio 200 bid (started 3/11) for PAF ? ?9. [place] ?-Palliative Care consulted, appreciate assistance ?-Remains full code. Family continuing [place] discussions. Palliative planning to meet with family again on 03/13. ?-He does not want HD ?-Not a candidate for home milrinone ? ? ? ?Length of Stay: 9 ? ?Glori Bickers, MD  ?09/02/2021, 10:44 AM ? ?Advanced Heart Failure Team ?Pager [phone] (M-F; 7a - 5p)  ?Please contact [facility] for night-coverage after hours (5p -7a ) and weekends on [email or web address] ? ? ? ? ?

## 2021-09-02 NOTE — Progress Notes (Signed)
?Progress Note ? ? ?Patient: Anthony Foster EXB:[id number] DOB: 03-Jan-1941 DOA: 08/24/2021     9 ?DOS: the patient was seen and examined on 09/02/2021 ?  ?Brief hospital course: ?Mr. Gopal was admitted to the hospital with the working diagnosis of decompensated heart failure.  ? ?81 yo male with the past medical history of heart failure, atrial fibrillation, CKD stage IV who presented with edema. Reported worsening lower extremity edema, progressing to scrotal edema. He has not been compliant with his medications. On his initial physical examination his blood pressure was 102/78, HR 83, RR 14 and oxygen saturation 98%, lungs with decreased breath sounds, heart with S1 and S2 present irregularly irregular, with no gallops, abdomen protuberant and positive lower extremity edema.  ?Right upper extremity fistula in place.  ? ?Na 141, K 4.9 CL 114 bicarbonate 18, glucose 104 ,bun 51,cr 3,76  ?BNP 3,914 ?Wbc 9,6, hgb 13,7 hct 44,2 plt 130  ?Sars covid 19 negative  ?Urine analysis with sg 1,019 with > 50 rbc and 0-5 wbc  ? ?Ct renal with bilateral scrotal hydroceles, partial renal atrophy, no hydronephrosis, infrarenal aortic aneurysm 40 mm diameter.  ? ?Chest radiograph with cardiomegaly with no infiltrates. Mild hilar vascular congestion.  ? ?EKG 97 bpm, left axis deviation, left anterior fascicular block, with atrial fibrillation rhythm, with positive PVCs, no significant ST segment or T wave changes.  ? ?Patient was placed on diuretic therapy for volume overload.  ?Echocardiogram with reduced LV systolic function.  ? ?Right heart catheterization with low cardiac output.  ?Started on milrinone for inotropic support.  ? ? ?Assessment and Plan: ?* Acute on chronic combined systolic and diastolic CHF (congestive heart failure) ([place]) ?Echocardiogram with worsening LV systolic function down to 20 to 25%.  ? ?Urine output is 3,550 ml over last 24 hrs ?Systolic blood pressure 102 range and this pm down to 93 mmHg.    ?  ?Continue blood pressure support with midodrine and to consider wean off milrinone infusion in preparation for possible discharge home in 24 to 48 hrs.  ?Transition to oral diuretic therapy with torsemide today.  ? ?Not able to use ras inhibition due to risk of hypotension, and not on B blockade due to low output heart failure decompensation.  ? ?Out of bed to chair tid with meals and encourage mobility.  ? ? ? ?Acute kidney injury superimposed on chronic kidney disease ([place]) ?Hypomagnesemia  ?Edema continue to improve.  ? ?Serum cr is trending down to 4,29 with K at 4,1 and serum bicarbonate at 31. Mg 2.6 ? ?Continue diuresis with oral torsemide ?Close blood pressure monitoring, follow up on renal function and electrolytes in am.  ? ?PAF (paroxysmal atrial fibrillation) (HCC) on chronic anticoagulation ?Heart rate has been controlled, continue anticoagulation with apixaban.  ?Transitory non sustained VT, continue telemetry monitoring.  ? ?Not on av blockade due to risk of hypotension. ? ? ?Hyperlipidemia ?Continue with atorvastatin.  ? ?Essential hypertension ?Blood pressure support with midodrine and IV milrinone.  ?Continue blood pressure monitoring.  ?To consider discontinue milrinone infusion in preparation for possible dc home in the next 24 to 48 hrs.  ? ?AAA (abdominal aortic aneurysm) ?Patient has a known AAA last evaluated by ultrasound 05/2021 noted to be 4 cm at that time. ? ?Thrombocytopenia ([place]) ?Cell count has been stable with Plt at 136, hgb is 12,7and hct at 37,7.  ?Close follow up cell count.  ? ? ?Tobacco use ?Smoking cessation  ? ? ? ? ?  ? ?  Subjective: patient is feeling better, continue to improve volume status and decrease edema, no dyspnea  ? ?Physical Exam: ?Vitals:  ? 09/01/21 1936 09/02/21 0355 09/02/21 0400 09/02/21 0807  ?BP: (!) 112/47 (!) 112/56  93/67  ?Pulse: 78 91  98  ?Resp: '20 20  20  '$ ?Temp: 98.2 ?F (36.8 ?C) 97.8 ?F (36.6 ?C)  98.5 ?F (36.9 ?C)  ?TempSrc: Oral Oral  Oral   ?SpO2: 95% 96%  98%  ?Weight:   71 kg   ?Height:      ? ?Neurology awake and alert ?ENT with no pallor ?Cardiovascular with S1 and S2 present with no gallops or rubs, positive systolic murmur at the apex ?No JVD ?Trace bilateral lower extremity edema ?Respiratory with no rales or wheezing ?Abdomen soft and not distended  ?Data Reviewed: ? ? ? ?Family Communication: no family at the bedside  ? ?Disposition: ?Status is: Inpatient ?Remains inpatient appropriate because: heart failure and renal failure  ? Planned Discharge Destination: Home ? ?Author: ?Tawni Millers, MD ?09/02/2021 1:19 PM ? ?For on call review [email or web address].  ?

## 2021-09-02 NOTE — Plan of Care (Signed)
  Problem: Clinical Measurements: Goal: Respiratory complications will improve Outcome: Progressing   Problem: Activity: Goal: Risk for activity intolerance will decrease Outcome: Progressing   Problem: Elimination: Goal: Will not experience complications related to urinary retention Outcome: Progressing   Problem: Safety: Goal: Ability to remain free from injury will improve Outcome: Progressing   

## 2021-09-03 DIAGNOSIS — Z515 Encounter for palliative care: Secondary | ICD-10-CM | POA: Diagnosis not present

## 2021-09-03 DIAGNOSIS — I5043 Acute on chronic combined systolic (congestive) and diastolic (congestive) heart failure: Secondary | ICD-10-CM | POA: Diagnosis not present

## 2021-09-03 DIAGNOSIS — I480 Paroxysmal atrial fibrillation: Secondary | ICD-10-CM | POA: Diagnosis not present

## 2021-09-03 DIAGNOSIS — Z7189 Other specified counseling: Secondary | ICD-10-CM | POA: Diagnosis not present

## 2021-09-03 DIAGNOSIS — N179 Acute kidney failure, unspecified: Secondary | ICD-10-CM | POA: Diagnosis not present

## 2021-09-03 DIAGNOSIS — I10 Essential (primary) hypertension: Secondary | ICD-10-CM | POA: Diagnosis not present

## 2021-09-03 LAB — BASIC METABOLIC PANEL
Anion gap: 13 (ref 5–15)
BUN: 77 mg/dL — ABNORMAL HIGH (ref 8–23)
CO2: 33 mmol/L — ABNORMAL HIGH (ref 22–32)
Calcium: 9.5 mg/dL (ref 8.9–10.3)
Chloride: 91 mmol/L — ABNORMAL LOW (ref 98–111)
Creatinine, Ser: 4.61 mg/dL — ABNORMAL HIGH (ref 0.61–1.24)
GFR, Estimated: 12 mL/min — ABNORMAL LOW (ref >60)
Glucose, Bld: 101 mg/dL — ABNORMAL HIGH (ref 70–99)
Potassium: 4 mmol/L (ref 3.5–5.1)
Sodium: 137 mmol/L (ref 135–145)

## 2021-09-03 LAB — COOXEMETRY PANEL
Carboxyhemoglobin: 1.5 % (ref 0.5–1.5)
Methemoglobin: 0.8 % (ref 0.0–1.5)
O2 Saturation: 84.2 %
Total hemoglobin: 13.9 g/dL (ref 12.0–16.0)

## 2021-09-03 LAB — MAGNESIUM: Magnesium: 2.2 mg/dL (ref 1.7–2.4)

## 2021-09-03 NOTE — Progress Notes (Signed)
?Progress Note ? ? ?Patient: Anthony Foster JSH:[id number] DOB: 05/08/1941 DOA: 08/24/2021     10 ?DOS: the patient was seen and examined on 09/03/2021 ?  ?Brief hospital course: ?[name] was admitted to the hospital with the working diagnosis of decompensated heart failure.  ? ?81 yo male with the past medical history of heart failure, atrial fibrillation, CKD stage IV who presented with edema. Reported worsening lower extremity edema, progressing to scrotal edema. He has not been compliant with his medications. On his initial physical examination his blood pressure was 102/78, HR 83, RR 14 and oxygen saturation 98%, lungs with decreased breath sounds, heart with S1 and S2 present irregularly irregular, with no gallops, abdomen protuberant and positive lower extremity edema.  ?Right upper extremity fistula in place.  ? ?Na 141, K 4.9 CL 114 bicarbonate 18, glucose 104 ,bun 51,cr 3,76  ?BNP 3,914 ?Wbc 9,6, hgb 13,7 hct 44,2 plt 130  ?Sars covid 19 negative  ?Urine analysis with sg 1,019 with > 50 rbc and 0-5 wbc  ? ?Ct renal with bilateral scrotal hydroceles, partial renal atrophy, no hydronephrosis, infrarenal aortic aneurysm 40 mm diameter.  ? ?Chest radiograph with cardiomegaly with no infiltrates. Mild hilar vascular congestion.  ? ?EKG 97 bpm, left axis deviation, left anterior fascicular block, with atrial fibrillation rhythm, with positive PVCs, no significant ST segment or T wave changes.  ? ?Patient was placed on diuretic therapy for volume overload.  ?Echocardiogram with reduced LV systolic function.  ? ?Right heart catheterization with low cardiac output.  ?Started on milrinone for inotropic support.  ? ? ?Assessment and Plan: ?* Acute on chronic combined systolic and diastolic CHF (congestive heart failure) ([place]) ?Echocardiogram with worsening LV systolic function down to 20 to 25%.  ? ?Urine output is 2,200  ml over last 24 hrs ?Continue to improve edema. ? ?Patient has been on milrinone  infusion and diuretic therapy has been transitioned to oral therapy with good toleration.  ? ?Not able to use ras inhibition due to risk of hypotension, and not on B blockade due to low output heart failure decompensation.  ? ?Out of bed to chair tid with meals and encourage mobility.  ?To consider wean off inotropic support in preparation for discharge home with hospice services.  ? ? ? ?Acute kidney injury superimposed on chronic kidney disease ([place]) ?Hypomagnesemia  ?His edema has been improving. ? ?Urine output over last 24 hrs is 2,200  ? ?Renal function with serum cr at 4,61 with K at 4,0 and serum bicarbonate at 33. ?Patient has declined renal replacement therapy.  ?Follow up renal function in am.  ?Avoid hypotension and nephrotoxic medications.  ? ?PAF (paroxysmal atrial fibrillation) (HCC) on chronic anticoagulation ?Heart rate has been controlled, continue anticoagulation with apixaban.  ?Transitory non sustained VT, continue telemetry monitoring.  ? ?Not on av blockade due to risk of hypotension. ? ? ?Hyperlipidemia ?Continue with atorvastatin.  ? ?Essential hypertension ?Patient has been on midodrine and milrinone ?Plan to continue with midodrine and discontinue milrinone. ?He is not candidate for home milrinone infusion.  ? ?AAA (abdominal aortic aneurysm) ?Patient has a known AAA last evaluated by ultrasound 05/2021 noted to be 4 cm at that time. ? ?Thrombocytopenia ([place]) ?Cell count has been stable with Plt at 136, hgb is 12,7and hct at 37,7.  ?Close follow up cell count.  ? ? ?Tobacco use ?Smoking cessation  ? ? ? ? ?  ? ?Subjective: patient is feeling well, he has been weak today.  ? ?  Physical Exam: ?Vitals:  ? 09/03/21 0316 09/03/21 0431 09/03/21 0726 09/03/21 1114  ?BP: (!) 91/50  (!) 87/47 106/66  ?Pulse: 92  87 71  ?Resp: '20  20 20  '$ ?Temp: 98.5 ?F (36.9 ?C)  98.4 ?F (36.9 ?C)   ?TempSrc: Oral  Oral Oral  ?SpO2: 95%  99% 94%  ?Weight:  69.9 kg    ?Height:      ? ?Neurology awake and alert ?ENT  with mild pallor ?Cardiovascular with S1 and S2 present and rhythmic with no gallops or rubs, positive murmur at the right sternal border ?No JVD ?No lower extremity edema ?Respiratory with no wheezing or rales ?Abdomen with no distention  ?Data Reviewed: ? ? ? ?Family Communication: no family at the bedside  ? ?Disposition: ?Status is: Inpatient ?Remains inpatient appropriate because: heart failure and renal failure, plan for dc home in the next 24 to 48 with home hospice services  ? Planned Discharge Destination: Home ? ?Author: ?Tawni Millers, MD ?09/03/2021 3:24 PM ? ?For on call review [email or web address]  ?

## 2021-09-03 NOTE — Progress Notes (Signed)
Patient ID: HAYVEN FATIMA, male   DOB: [birth year], 81 y.o.   MRN: [id number] ? ? ? ?Progress Note from the Palliative Medicine Team at [facility] ? ? ?Patient Name: Anthony Foster        ?Date: 09/03/2021 ?DOB: 31-Mar-1941  Age: 81 y.o. MRN#: [id number] ?Attending Physician: Tawni Millers,* ?Primary Care Physician: Wenda Low, MD ?Admit Date: 08/24/2021 ? ? ?Medical records reviewed  ? ? 81 y.o. 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  Patient reports initial swelling in his legs progressing into his scrotum.  Increasing shortness of breath and abdominal distention. ?  ?Of note he reports in November of 2022 he had a home bout of diarrhea for which he had stopped taking all of his medicines.  However he recently restarted in the last 2-3 weeks and has been taking them as prescribed.  He reports that he recently had an echocardiogram.  Unclear if this was possibly done at the [place].  He has a right upper extremity fistula in place for about 3 years now, but has not needed to start on dialysis.  He is followed by [facility]. ?  ?Admitted through the emergency room for treatment and  stabilization.   Marked fluid volume overload, mixed pulmonary hypertension.  Started on milrinone and diuresed with IV Lasix. ?  ?Chronic kidney disease, per nephrology not an urgent need of dialysis but patient would not be a good dialysis patient's secondary to hypotension and heart failure. ? ?Patient continues on IV milrinone, attending team in process of converting from IV to oral agents, for anticipation of discharge home ?  ?Patient faces treatment option decisions, advanced directive decisions and anticipatory care  needs. ? ?This NP visited patient at the bedside as a follow up for palliative medicine needs and emotional support and to have scheduled telephone meeting with both his grandson/ Lelon Mast  and his son/ Pilot Prindle for continued conversation regarding current medical situation, goals of care, treatment option decisions and anticipatory care needs. ? ?We spoke last week on Thursday and today's conversation focused around disposition plan. ? ?Continued  education offered on his current medical situation specific to cardiorenal syndrome, and the associated limited prognosis with cardiorenal syndrome.      ?All verbalize an understanding of the complex medical situation.   ? ?Patient and family support decision for no dialysis and plan is to transition home with hospice services in place. ? ?Education offered on hospice benefit; philosophy and eligibility both in the home and residential.   Family are putting together a plan for patient to discharge home, education regarding increasing anticipatory care needs offered. ? ? ? ?Plan of care ?-Full code ?    -Educated patient/family to consider DNR/DNI status understanding evidenced based poor outcomes in similar hospitalized patient, as the cause of arrest is likely associated with advanced chronic illness rather than an easily reversible acute cardio-pulmonary event.  ?      Patient was unable to make decision regarding DNR/DNI today.   Again today patient does not wish to make decision regarding CODE STATUS.  His son tells me that he will be in town next week and will help his father put necessary  documents in place.  A blank MOST form is left at the bedside ?-No dialysis now or in the future ?-Discharge home with hospice when medically stable.  Placed referral to transition of care team ? ?No documented healthcare power of attorney, patient is able to verbalize that he would like for his grandson Anthony Foster and his son Anthony Foster to work together to make  decisions in the event the patient cannot make decisions for himself. ? ?Discussed with Dr. Edythe Clarity Arrien , Dr. Haroldine Laws and Dr. Justin Mend via secure chat. ? ?Questions and concerns addressed    ? ?PMT will continue to support holistically ? ? ?Anthony Lessen NP  ?Palliative Medicine Team Team Phone # [phone] ?Pager [phone] ?  ?

## 2021-09-03 NOTE — Plan of Care (Signed)
  Problem: Clinical Measurements: Goal: Diagnostic test results will improve Outcome: Progressing Goal: Respiratory complications will improve Outcome: Progressing   

## 2021-09-03 NOTE — Progress Notes (Addendum)
? ? Advanced Heart Failure Rounding Note ? ?PCP-Cardiologist: Anthony Ruths, MD  ? ?Subjective:   ? ?08/28/21- RHC with biventricular failure, marked volume overload, low CO by Fick (thermo ok), and mixed pulmonary HTN.  Started on milrinone 0.125 mcg.  ? ?Co-ox 84% on 0.125 milrinone. ? ?CVP 6. On po Torsemide. ? ?Feeling well. No CP or dyspnea.  ? ?? Rhythm.  ? ? ?Objective:   ?Weight Range: ?69.9 kg ?Body mass index is 23.43 kg/m?.  ? ?Vital Signs:   ?Temp:  [97.9 ?F (36.6 ?C)-98.5 ?F (36.9 ?C)] 98.4 ?F (36.9 ?C) (03/13 0726) ?Pulse Rate:  [82-92] 87 (03/13 0726) ?Resp:  [20] 20 (03/13 0726) ?BP: (87-118)/(47-70) 87/47 (03/13 0726) ?SpO2:  [95 %-99 %] 99 % (03/13 0726) ?Weight:  [69.9 kg] 69.9 kg (03/13 0431) ?Last BM Date : 09/02/21 ? ?Weight change: ?Filed Weights  ? 09/01/21 0334 09/02/21 0400 09/03/21 0431  ?Weight: 73.3 kg 71 kg 69.9 kg  ? ? ?Intake/Output:  ? ?Intake/Output Summary (Last 24 hours) at 09/03/2021 1112 ?Last data filed at 09/03/2021 0825 ?Gross per 24 hour  ?Intake 784.15 ml  ?Output 2600 ml  ?Net -1815.85 ml  ?  ? ? ?Physical Exam  ? ?General:  No distress. Lying in bed. ?HEENT: normal ?Neck: supple. no JVD. Carotids 2+ bilat; no bruits.  ?Cor: PMI nondisplaced. Regular rhythm with ectopy. No rubs, gallops or murmurs. ?Lungs: clear ?Abdomen: soft, nontender, nondistended. No hepatosplenomegaly.  ?Extremities: no cyanosis, clubbing, rash, trace edema ?Neuro: alert & orientedx3, cranial nerves grossly intact. moves all 4 extremities w/o difficulty. Affect pleasant ? ? ? ? ?Telemetry  ? ?? Rhythm. P waves present. HR 80s, 2-5 PVCs/min ? ? ?Labs  ?  ?CBC ?Recent Labs  ?  09/01/21 ?0154  ?WBC 8.6  ?HGB 12.7*  ?HCT 37.7*  ?MCV 80.0  ?PLT 136*  ? ?Basic Metabolic Panel ?Recent Labs  ?  09/02/21 ?0410 09/03/21 ?0503  ?NA 137 137  ?K 4.1 4.0  ?CL 96* 91*  ?CO2 31 33*  ?GLUCOSE 94 101*  ?BUN 73* 77*  ?CREATININE 4.29* 4.61*  ?CALCIUM 9.1 9.5  ?MG 2.6* 2.2  ? ?Liver Function Tests ?No results for  input(s): AST, ALT, ALKPHOS, BILITOT, PROT, ALBUMIN in the last 72 hours. ?No results for input(s): LIPASE, AMYLASE in the last 72 hours. ?Cardiac Enzymes ?No results for input(s): CKTOTAL, CKMB, CKMBINDEX, TROPONINI in the last 72 hours. ? ?BNP: ?BNP (last 3 results) ?Recent Labs  ?  08/24/21 ?0145  ?BNP 3,914.4*  ? ? ?ProBNP (last 3 results) ?No results for input(s): PROBNP in the last 8760 hours. ? ? ?D-Dimer ?No results for input(s): DDIMER in the last 72 hours. ?Hemoglobin A1C ?No results for input(s): HGBA1C in the last 72 hours. ?Fasting Lipid Panel ?No results for input(s): CHOL, HDL, LDLCALC, TRIG, CHOLHDL, LDLDIRECT in the last 72 hours. ?Thyroid Function Tests ?No results for input(s): TSH, T4TOTAL, T3FREE, THYROIDAB in the last 72 hours. ? ?Invalid input(s): FREET3 ? ?Other results: ? ? ?Imaging  ? ? ?No results found. ? ? ?Medications:   ? ? ?Scheduled Medications: ? amiodarone  200 mg Oral BID  ? apixaban  2.5 mg Oral BID  ? atorvastatin  80 mg Oral Daily  ? Chlorhexidine Gluconate Cloth  6 each Topical Daily  ? ezetimibe  10 mg Oral Daily  ? midodrine  10 mg Oral BID WC  ? sodium chloride flush  3 mL Intravenous Q12H  ? sodium chloride flush  3 mL Intravenous  Q12H  ? torsemide  80 mg Oral BID  ? ? ?Infusions: ? sodium chloride    ? sodium chloride    ? milrinone 0.125 mcg/kg/min (09/02/21 1708)  ? ? ?PRN Medications: ?sodium chloride, sodium chloride, acetaminophen, ondansetron (ZOFRAN) IV, oxyCODONE-acetaminophen, sodium chloride flush, sodium chloride flush ? ? ? ?Patient Profile  ? ?  ?81 y/o male with CKD IV, CAD with ischemic CM. PAF ?  ?Admitted with worsening HF in setting of non-compliance. Echo with EF 35%-> 20-25%. Poor response to IV lasix with persistent fluid overload and worsening AKI ? ?Assessment/Plan  ?Acute on chronic systolic CHF ?-EF previously 35-40% on echo 2014 and 2017 ?-Hx ICD 2014 ?-Felt to be ICM. Had MI and stent in 2012 per chart review (no prior cath report available).  Prior care in [place].  ?- Echo this admit with EF 20-25%, RV low normal, PASP 50 mmHg, mild MR, moderate TR ?- Admitted with a/c CHF in setting of noncompliance with medications. Had been off his medications for at least 3 months and restarted 2-3 weeks ago. Not sure how much recurrent AF contributing. Had < 1% burden on device check in January. In AF on admit. BNP > 3,900 on admit ?- RHC marked volume overload, low CO by thermo and mixed pulmonary HTN. Started on milrinone 0.125 mcg. Stop milrinone today now that diuresed. Not ideal for home inotrope. ?- CVP 6. Continue po Torsemide 80 mg BID. Daily BMET. Down total of 30 lb. ?-No BB with low-output HF ?-GDMT limited by hypotension and AKI on CKD ?-On midodrine 10 mg TID for BP support ?-TED hose ?  ?2. CAD ?- Hx MI and stent in 2012 per chart review ?- No s/s angina ?- On atorvastatin 80 + zetia ?- No aspirin d/t anticoagulation ?  ?3. Atrial fibrillation. paroxysmal ?-1% burden on last device check in January ?-in AF this admit, rate controlled.   ?-Suspect this is related to decompensation . Continue amio 200 bid.  ?-? Rhythm today. P waves present. Will check ECG. ?-TSH okay ?-On eliquis 2.5 mg BID. No bleeding ?  ?4. AKI on CKD IV ?- Scr baseline 3.2, up to 4.8 with attempts to diurese. Scr stable at 4.5 -> 4.3>4.6. ?- Has AV fistula in place.  ?- Nephrology following. Patient does not want HD. ?- Continue midodrine for BP support ?  ?5. Thrombocytopenia ?- Platelets stable ~136 ?- Monitor ?  ?6. AAA ?- 4.0 cm on CT  ?  ?7. Tobacco use ?- Smoking cessation recommended ? ?8. NSVT/PVCs ?- may be related to milrinone, stopping today ?- keep K. 4.0 Mg >2.0 ?- PVC burden 25-30/min -> improved with amio  ?-  On amio 200 bid (started 3/11) for PAF ? ?9. [place] ?-Palliative Care consulted, appreciate assistance ?-Remains full code. Family continuing [place] discussions. Palliative planning to meet with family again today. ?-He does not want HD ?-Not a candidate for home  milrinone ? ? ? ?Length of Stay: 10 ? ?Foster, Anthony N, PA-C  ?09/03/2021, 11:12 AM ? ?Advanced Heart Failure Team ?Pager [phone] (M-F; 7a - 5p)  ?Please contact [facility] for night-coverage after hours (5p -7a ) and weekends on [email or web address] ? ?Patient seen and examined with the above-signed Advanced Practice Provider and/or Housestaff. I personally reviewed laboratory data, imaging studies and relevant notes. I independently examined the patient and formulated the important aspects of the plan. I have edited the note to reflect any of my changes or salient points. I have personally  discussed the plan with the patient and/or family. ? ?Volume status much improved. Weight down 30 pounds. Now back on po diuretics. Back in NSR on po amio  ? ?General:  Elderly  No resp difficulty ?HEENT: normal ?Neck: supple. no JVD. Carotids 2+ bilat; no bruits. No lymphadenopathy or thryomegaly appreciated. ?Cor: PMI nondisplaced. Regular rate & rhythm. No rubs, gallops or murmurs. ?Lungs: clear ?Abdomen: soft, nontender, nondistended. No hepatosplenomegaly. No bruits or masses. Good bowel sounds. ?Extremities: no cyanosis, clubbing, rash, edema ?Neuro: alert & orientedx3, cranial nerves grossly intact. moves all 4 extremities w/o difficulty. Affect pleasant ? ?This is as good as we can get him. Volume status looks great after 30 pound diuresis. Now back in NSR on po amio. Will stop milrinone and see if he can maintain on po meds. Follow volume status and renal function closely over next 1-2 days.  ? ?Glori Bickers, MD  ?6:20 PM ? ? ?

## 2021-09-03 NOTE — Progress Notes (Signed)
Physical Therapy Treatment ?Patient Details ?Name: Anthony Foster ?MRN: [id number] ?DOB: 07/16/1940 ?Today's Date: 09/03/2021 ? ? ?History of Present Illness Pt is an 81 y.o. male who presented 08/24/21 with x3 day hx of edema in legs and scrotum along with SOB, mild nonproductive cough, abdominal distention, and intermittent wheezing. Pt admitted with acute on chronic combined systolic and diastolic CHF and AKI superimposed on CKD. S/p R heart cath 3/7. PMH: heart failure, afib, CKD stage IV, AAA, CAD, gout, HTN ? ?  ?PT Comments  ? ?  The pt was agreeable to limited session this afternoon. He was able to complete ~25 ft ambulation, but required single UE support and up to minA at times due to posterior LOB while turning. VSS with mobility, but pt needing increased UE support and physical assist to steady with shorter distance ambulation than initial eval. Suspect due to not working with PT or mobility since 3/8. Will continue to benefit from maximal mobility but now updated recommendations to HHPT and increased assist from family at d/c.  ?  ?Recommendations for follow up therapy are one component of a multi-disciplinary discharge planning process, led by the attending physician.  Recommendations may be updated based on patient status, additional functional criteria and insurance authorization. ? ?Follow Up Recommendations ? Home health PT ?  ?  ?Assistance Recommended at Discharge Frequent or constant Supervision/Assistance  ?Patient can return home with the following A little help with walking and/or transfers;A little help with bathing/dressing/bathroom;Assistance with cooking/housework;Assist for transportation;Help with stairs or ramp for entrance ?  ?Equipment Recommendations ? None recommended by PT  ?  ?Recommendations for Other Services   ? ? ?  ?Precautions / Restrictions Precautions ?Precautions: Fall ?Precaution Comments: monitor BP ?Restrictions ?Weight Bearing Restrictions: No  ?  ? ?Mobility ? Bed  Mobility ?Overal bed mobility: Modified Independent ?  ?  ?  ?  ?  ?  ?General bed mobility comments: Pt able to transition supine <> sit EOB without assistance, HOB elevated. ?  ? ?Transfers ?Overall transfer level: Needs assistance ?Equipment used: 1 person hand held assist ?Transfers: Sit to/from Stand ?Sit to Stand: Min guard ?  ?  ?  ?  ?  ?General transfer comment: minG and HHA or IV pole for UE support. ?  ? ?Ambulation/Gait ?Ambulation/Gait assistance: Min guard, Min assist ?Gait Distance (Feet): 25 Feet ?Assistive device: IV Pole ?Gait Pattern/deviations: Step-through pattern, Decreased stride length ?Gait velocity: reduced ?Gait velocity interpretation: <1.31 ft/sec, indicative of household ambulator ?  ?General Gait Details: pt with slow gait, dependent on single UE support on RW. assist to steady x1 with posterior LOB while turning ? ? ? ?  ?Balance Overall balance assessment: Mild deficits observed, not formally tested ?  ?  ?  ?  ?  ?  ?  ?  ?  ?  ?  ?  ?  ?  ?  ?  ?  ?  ?  ? ?  ?Cognition Arousal/Alertness: Awake/alert ?Behavior During Therapy: [facility] for tasks assessed/performed ?Overall Cognitive Status: Within Functional Limits for tasks assessed ?  ?  ?  ?  ?  ?  ?  ?  ?  ?  ?  ?  ?  ?  ?  ?  ?General Comments: Pt agreeable, slightly self-limiting to activity and distance mobilized. poor insight to need for assist at home ?  ?  ? ?  ?Exercises   ? ?  ?General Comments General comments (skin integrity, edema,  etc.): VSS on RA ?  ?  ? ?Pertinent Vitals/Pain Pain Assessment ?Pain Assessment: No/denies pain ?Pain Intervention(s): Monitored during session  ? ? ? ?PT Goals (current goals can now be found in the care plan section) Acute Rehab PT Goals ?Patient Stated Goal: to go home and go bowling ?PT Goal Formulation: With patient ?Time For Goal Achievement: 09/12/21 ?Potential to Achieve Goals: Good ?Progress towards PT goals: Progressing toward goals ? ?  ?Frequency ? ? ? Min 3X/week ? ? ? ?  ?PT Plan  Discharge plan needs to be updated  ? ? ?   ?AM-PAC PT "6 Clicks" Mobility   ?Outcome Measure ? Help needed turning from your back to your side while in a flat bed without using bedrails?: None ?Help needed moving from lying on your back to sitting on the side of a flat bed without using bedrails?: None ?Help needed moving to and from a bed to a chair (including a wheelchair)?: A Little ?Help needed standing up from a chair using your arms (e.g., wheelchair or bedside chair)?: A Little ?Help needed to walk in hospital room?: A Little ?Help needed climbing 3-5 steps with a railing? : A Little ?6 Click Score: 20 ? ?  ?End of Session Equipment Utilized During Treatment: Gait belt ?Activity Tolerance: Patient tolerated treatment well ?Patient left: in bed;with call bell/phone within reach ?Nurse Communication: Mobility status ?PT Visit Diagnosis: Unsteadiness on feet (R26.81);Other abnormalities of gait and mobility (R26.89);Difficulty in walking, not elsewhere classified (R26.2) ?  ? ? ?Time: 3300-7622 ?PT Time Calculation (min) (ACUTE ONLY): 15 min ? ?Charges:  $Therapeutic Exercise: 8-22 mins          ?          ? ?[name], PT, DPT  ? ?Acute Rehabilitation Department ?Pager #: [phone] ? ? ?[name] ?09/03/2021, 4:02 PM ? ?

## 2021-09-03 NOTE — Progress Notes (Signed)
?[facility] ?Progress Note  ? ?Subjective:   Feeling fine.  UOP has been good.  Appetite good.  Hopeful for d/c by WEd.  ?I/Os 784 / 2200 UOP, net neg 14.5L for admission.  ? ?Objective ?Vitals:  ? 09/02/21 1926 09/03/21 0316 09/03/21 0431 09/03/21 0726  ?BP: 118/70 (!) 91/50  (!) 87/47  ?Pulse: 82 92  87  ?Resp: '20 20  20  '$ ?Temp: 97.9 ?F (36.6 ?C) 98.5 ?F (36.9 ?C)  98.4 ?F (36.9 ?C)  ?TempSrc: Oral Oral  Oral  ?SpO2: 97% 95%  99%  ?Weight:   69.9 kg   ?Height:      ? ?Physical Exam ?General: thin may lying flat in bed in no distress ?Heart: RRR ?Lungs: clear ?Abdomen: soft thin ?Extremities: no edema, RUE BC AVF +t/b, well developed ?GU: condom cath  ? ?Additional Objective ?Labs: ?Basic Metabolic Panel: ?Recent Labs  ?Lab 09/01/21 ?0154 09/02/21 ?0410 09/03/21 ?0503  ?NA 137 137 137  ?K 4.1 4.1 4.0  ?CL 99 96* 91*  ?CO2 28 31 33*  ?GLUCOSE 105* 94 101*  ?BUN 73* 73* 77*  ?CREATININE 4.57* 4.29* 4.61*  ?CALCIUM 8.9 9.1 9.5  ? ?Liver Function Tests: ?No results for input(s): AST, ALT, ALKPHOS, BILITOT, PROT, ALBUMIN in the last 168 hours. ?No results for input(s): LIPASE, AMYLASE in the last 168 hours. ?CBC: ?Recent Labs  ?Lab 08/28/21 ?1233 08/30/21 ?0444 09/01/21 ?0154  ?WBC  --  8.3 8.6  ?HGB 14.6  14.6 12.1* 12.7*  ?HCT 43.0  43.0 35.9* 37.7*  ?MCV  --  80.7 80.0  ?PLT  --  133* 136*  ? ?Blood Culture ?No results found for: SDES, [place], [place], REPTSTATUS ? ?Cardiac Enzymes: ?No results for input(s): CKTOTAL, CKMB, CKMBINDEX, TROPONINI in the last 168 hours. ?CBG: ?No results for input(s): GLUCAP in the last 168 hours. ?Iron Studies: No results for input(s): IRON, TIBC, TRANSFERRIN, FERRITIN in the last 72 hours. ?'@lablastinr3'$ @ ?Studies/Results: ?No results found. ?Medications: ? sodium chloride    ? sodium chloride    ? milrinone 0.125 mcg/kg/min (09/02/21 1708)  ? ? amiodarone  200 mg Oral BID  ? apixaban  2.5 mg Oral BID  ? atorvastatin  80 mg Oral Daily  ? Chlorhexidine Gluconate  Cloth  6 each Topical Daily  ? ezetimibe  10 mg Oral Daily  ? midodrine  10 mg Oral BID WC  ? sodium chloride flush  3 mL Intravenous Q12H  ? sodium chloride flush  3 mL Intravenous Q12H  ? torsemide  80 mg Oral BID  ? ? ? ?Assessment/Plan: ?**Acute on chronic combined heart failure:  EF down to 20-25% this admission.  CHF team following.  Has diuresed well with inotrope (continues on) + IV diuretics, changed to po diuretics yest (torsemide 80 BID) and seems ok.  On midodrine support as well with BPs reasonable.  CHF following - cont midodrine for now but no plans for home inotrope.  ? ?**AKI on CKD:  baseline CKD 4 (Cr 3.1-3.7, f/b [place]) now with AKI in setting of cardiorenal.  Has diuresed well with inotropic support + high dose diuretics (now high dose oral).  No current indications for RRT.  Electrolytes, Hb, acid/base all ok.  Has AVF which would be useable if needed.  Can arrange f/u closer to d/c.    ? ?**A fib:  on apixiban, amio; no BB with low BPs, rate controlled.  ? ?**HL: on statin ? ?**ongoing tobacco: has been provided cessation counseling.  ? ?  Will follow along, page with concerns.  ? ?Jannifer Hick MD ?09/03/2021, 10:09 AM  ?[facility] ?Pager: [phone] ? ? ?

## 2021-09-03 NOTE — Progress Notes (Signed)
At [facility] with central telemetry called to tell me that the patient has an 8 beat run of V-tach at about 1 today. ?

## 2021-09-03 NOTE — TOC Progression Note (Signed)
Transition of Care (TOC) - Progression Note  ? ? ?Patient Details  ?Name: Celso Granja Bejar ?MRN: [id number] ?Date of Birth: 1940/09/23 ? ?Transition of Care (TOC) CM/SW Contact  ?Coralee Pesa, LCSWA ?Phone Number: ?09/03/2021, 4:34 PM ? ?Clinical Narrative:    ?CSW acknowledges consult for home hospice. At families request to medical team, CSW made referral to [facility] in [place], and spoke with [place]. TOC will continue to follow for further needs. ? ? ?Expected Discharge Plan: [place] ?Barriers to Discharge: Continued Medical Work up ? ?Expected Discharge Plan and Services ?Expected Discharge Plan: [place] ?  ?Discharge Planning Services: CM Consult ?  ?Living arrangements for the past 2 months: [place] ?                ?  ?  ?  ?  ?  ?  ?  ?  ?  ?  ? ? ?Social Determinants of Health (SDOH) Interventions ?  ? ?Readmission Risk Interventions ?No flowsheet data found. ? ?

## 2021-09-04 DIAGNOSIS — I5043 Acute on chronic combined systolic (congestive) and diastolic (congestive) heart failure: Secondary | ICD-10-CM | POA: Diagnosis not present

## 2021-09-04 DIAGNOSIS — Z7189 Other specified counseling: Secondary | ICD-10-CM | POA: Diagnosis not present

## 2021-09-04 DIAGNOSIS — I480 Paroxysmal atrial fibrillation: Secondary | ICD-10-CM | POA: Diagnosis not present

## 2021-09-04 DIAGNOSIS — I10 Essential (primary) hypertension: Secondary | ICD-10-CM | POA: Diagnosis not present

## 2021-09-04 DIAGNOSIS — N179 Acute kidney failure, unspecified: Secondary | ICD-10-CM | POA: Diagnosis not present

## 2021-09-04 DIAGNOSIS — Z515 Encounter for palliative care: Secondary | ICD-10-CM

## 2021-09-04 DIAGNOSIS — E785 Hyperlipidemia, unspecified: Secondary | ICD-10-CM

## 2021-09-04 LAB — COOXEMETRY PANEL
Carboxyhemoglobin: 2.1 % — ABNORMAL HIGH (ref 0.5–1.5)
Methemoglobin: <0.7 % (ref 0.0–1.5)
O2 Saturation: 82.2 %
Total hemoglobin: 13.7 g/dL (ref 12.0–16.0)

## 2021-09-04 LAB — RENAL FUNCTION PANEL
Albumin: 3 g/dL — ABNORMAL LOW (ref 3.5–5.0)
Anion gap: 13 (ref 5–15)
BUN: 83 mg/dL — ABNORMAL HIGH (ref 8–23)
CO2: 34 mmol/L — ABNORMAL HIGH (ref 22–32)
Calcium: 9.3 mg/dL (ref 8.9–10.3)
Chloride: 90 mmol/L — ABNORMAL LOW (ref 98–111)
Creatinine, Ser: 4.91 mg/dL — ABNORMAL HIGH (ref 0.61–1.24)
GFR, Estimated: 11 mL/min — ABNORMAL LOW (ref >60)
Glucose, Bld: 88 mg/dL (ref 70–99)
Phosphorus: 3.3 mg/dL (ref 2.5–4.6)
Potassium: 4.1 mmol/L (ref 3.5–5.1)
Sodium: 137 mmol/L (ref 135–145)

## 2021-09-04 NOTE — Care Management Important Message (Signed)
Important Message ? ?Patient Details  ?Name: Anthony Foster ?MRN: [id number] ?Date of Birth: 12-May-1941 ? ? ?Medicare Important Message Given:  Yes ? ? ? ? ?[name] ?09/04/2021, 9:29 AM ?

## 2021-09-04 NOTE — Progress Notes (Addendum)
Manufacturing engineer [facility]) Hospital Liaison: RN note   ? ?Notified by Transition of Care Manger of patient/family request for [facility] services at home after discharge. Chart and patient information under review by [facility] physician. Hospice eligibility confirmed.    ? ?Writer spoke with grandson, [name] to initiate education related to hospice philosophy, services and team approach to care. [name] verbalized understanding of information given. Per discussion, plan is for discharge to home by car.  Patient will need prescriptions for discharge comfort medications.    ? ?DME needs have been discussed, patient currently has the following equipment in the home: cane.  Patient/family requests the following DME for delivery to the home: none.  ? ?[facility] aware of the above. Please notify [facility] when patient is ready to leave the unit at discharge. (Call [phone] or [phone] after 5pm.) [facility] information and contact numbers given to [facility]. ?     ?A Please do not hesitate to call with questions.   ?Thank you,   ?Farrel Gordon, RN, CCM      ?[facility] Hospital Liaison   ?[phone] ?

## 2021-09-04 NOTE — Progress Notes (Addendum)
?Progress Note ? ? ?Patient: Anthony Foster RAQ:[id number] DOB: 1941/03/07 DOA: 08/24/2021     11 ?DOS: the patient was seen and examined on 09/04/2021 ?  ?Brief hospital course: ?Anthony Foster was admitted to the hospital with the working diagnosis of decompensated heart failure.  ? ?81 yo male with the past medical history of heart failure, atrial fibrillation, CKD stage IV who presented with edema. Reported worsening lower extremity edema, progressing to scrotal edema. He has not been compliant with his medications. On his initial physical examination his blood pressure was 102/78, HR 83, RR 14 and oxygen saturation 98%, lungs with decreased breath sounds, heart with S1 and S2 present irregularly irregular, with no gallops, abdomen protuberant and positive lower extremity edema.  ?Right upper extremity fistula in place.  ? ?Na 141, K 4.9 CL 114 bicarbonate 18, glucose 104 ,bun 51,cr 3,76  ?BNP 3,914 ?Wbc 9,6, hgb 13,7 hct 44,2 plt 130  ?Sars covid 19 negative  ?Urine analysis with sg 1,019 with > 50 rbc and 0-5 wbc  ? ?Ct renal with bilateral scrotal hydroceles, partial renal atrophy, no hydronephrosis, infrarenal aortic aneurysm 40 mm diameter.  ? ?Chest radiograph with cardiomegaly with no infiltrates. Mild hilar vascular congestion.  ? ?EKG 97 bpm, left axis deviation, left anterior fascicular block, with atrial fibrillation rhythm, with positive PVCs, no significant ST segment or T wave changes.  ? ?Patient was placed on diuretic therapy for volume overload.  ?Echocardiogram with reduced LV systolic function.  ? ?Right heart catheterization with low cardiac output.  ?Started on milrinone for inotropic support.  ? ? ?Assessment and Plan: ?* Acute on chronic combined systolic and diastolic CHF (congestive heart failure) ([place]) ?Echocardiogram with worsening LV systolic function down to 20 to 25%.  ? ?Patient has been off milrinone infusion with good toleration. ?Urine output is 1,800 cc over last 24  hrs. ? ?Not able to use ras inhibition due to risk of hypotension, and not on B blockade due to low output heart failure decompensation.  ? ?Continue blood pressure support with midodrine.  ?Today with improvement in edema and holding on diuretic therapy. ?To consider discontinue central line  ? ? ?Acute kidney injury superimposed on chronic kidney disease ([place]) ?Hypomagnesemia  ? ?Urine output over last 24 hrs is 1,800 ?Clinically patient is euvolemic  ? ?Renal function with serum cr at 4,91 with K at 4,1 and serum bicarbonate at 34.  ?Will need to continue diuresis as outpatient with loop diuretic. ?Patient has declined renal replacement therapy and hospice will follow up as outpatient.  ? ?PAF (paroxysmal atrial fibrillation) (HCC) on chronic anticoagulation ?Heart rate has been controlled, continue anticoagulation with apixaban.  ?Transitory non sustained VT, continue telemetry monitoring.  ? ?Not on av blockade due to risk of hypotension. ? ? ? ?Hyperlipidemia ?Continue with atorvastatin.  ? ?Essential hypertension ?Blood pressure has been stable with systolic at 456 and 256 mmHg. ?Now off milrinone  ?Continue with midodrine.  ?Follow up blood pressure closely.   ? ?AAA (abdominal aortic aneurysm) ?Patient has a known AAA last evaluated by ultrasound 05/2021 noted to be 4 cm at that time. ? ?Thrombocytopenia ([place]) ?Cell count has been stable with Plt at 136, hgb is 12,7and hct at 37,7.  ?Close follow up cell count.  ? ? ?Tobacco use ?Smoking cessation  ? ? ? ? ?  ? ?Subjective: patient is feeling bette, no edema or dyspnea  ? ?Physical Exam: ?Vitals:  ? 09/03/21 0726 09/03/21 1114 09/03/21 1925 09/04/21  0343  ?BP: (!) 87/47 106/66 (!) 109/58 105/68  ?Pulse: 87 71 76 79  ?Resp: '20 20  18  '$ ?Temp: 98.4 ?F (36.9 ?C)  98.6 ?F (37 ?C) 98.4 ?F (36.9 ?C)  ?TempSrc: Oral Oral Oral Oral  ?SpO2: 99% 94% 96% 97%  ?Weight:    69.9 kg  ?Height:      ? ?Neurology awake and alert ?ENT with mild pallor ?Cardiovascular with S1  and S2 present irregular with no gallops, positive systolic murmur at the apex ?No JVD ?No lower extremity edema ?Respiratory with no wheezing or rhonchi ?Abdomen soft  ?Data Reviewed: ? ? ? ?Family Communication: no family at the bedside  ? ?Disposition: ?Status is: Inpatient ?Remains inpatient appropriate because: possible dc home on 03/15 with home hospice,  ? Planned Discharge Destination: Home ? ?Author: ?Tawni Millers, MD ?09/04/2021 3:33 PM ? ?For on call review [email or web address].  ?

## 2021-09-04 NOTE — Progress Notes (Signed)
?[facility] ?Progress Note  ? ?Subjective:   Feeling fine.  UOP has been good.  Appetite good.  Hopeful for d/c by Wed.  ?I/Os 530 / 1800 UOP, net neg 15.6L for admission.  ?Reviewed notes - appears home with hospice and he confirms with me no dialysis ever ? ?Objective ?Vitals:  ? 09/03/21 0726 09/03/21 1114 09/03/21 1925 09/04/21 0343  ?BP: (!) 87/47 106/66 (!) 109/58 105/68  ?Pulse: 87 71 76 79  ?Resp: '20 20  18  '$ ?Temp: 98.4 ?F (36.9 ?C)  98.6 ?F (37 ?C) 98.4 ?F (36.9 ?C)  ?TempSrc: Oral Oral Oral Oral  ?SpO2: 99% 94% 96% 97%  ?Weight:    69.9 kg  ?Height:      ? ?Physical Exam ?General: thin may lying flat in bed in no distress ?Heart: RRR ?Lungs: clear ?Abdomen: soft thin ?Extremities: no edema, RUE BC AVF +t/b, well developed  ?GU: condom cath  ? ?Additional Objective ?Labs: ?Basic Metabolic Panel: ?Recent Labs  ?Lab 09/02/21 ?0410 09/03/21 ?0503 09/04/21 ?2229  ?NA 137 137 137  ?K 4.1 4.0 4.1  ?CL 96* 91* 90*  ?CO2 31 33* 34*  ?GLUCOSE 94 101* 88  ?BUN 73* 77* 83*  ?CREATININE 4.29* 4.61* 4.91*  ?CALCIUM 9.1 9.5 9.3  ?PHOS  --   --  3.3  ? ? ?Liver Function Tests: ?Recent Labs  ?Lab 09/04/21 ?7989  ?ALBUMIN 3.0*  ? ?No results for input(s): LIPASE, AMYLASE in the last 168 hours. ?CBC: ?Recent Labs  ?Lab 08/28/21 ?1233 08/30/21 ?0444 09/01/21 ?0154  ?WBC  --  8.3 8.6  ?HGB 14.6  14.6 12.1* 12.7*  ?HCT 43.0  43.0 35.9* 37.7*  ?MCV  --  80.7 80.0  ?PLT  --  133* 136*  ? ? ?Blood Culture ?No results found for: SDES, [place], [place], REPTSTATUS ? ?Cardiac Enzymes: ?No results for input(s): CKTOTAL, CKMB, CKMBINDEX, TROPONINI in the last 168 hours. ?CBG: ?No results for input(s): GLUCAP in the last 168 hours. ?Iron Studies: No results for input(s): IRON, TIBC, TRANSFERRIN, FERRITIN in the last 72 hours. ?'@lablastinr3'$ @ ?Studies/Results: ?No results found. ?Medications: ? sodium chloride    ? sodium chloride    ? ? amiodarone  200 mg Oral BID  ? apixaban  2.5 mg Oral BID  ? atorvastatin  80 mg  Oral Daily  ? Chlorhexidine Gluconate Cloth  6 each Topical Daily  ? ezetimibe  10 mg Oral Daily  ? midodrine  10 mg Oral BID WC  ? sodium chloride flush  3 mL Intravenous Q12H  ? sodium chloride flush  3 mL Intravenous Q12H  ? torsemide  80 mg Oral BID  ? ? ? ?Assessment/Plan: ?**Acute on chronic combined heart failure:  EF down to 20-25% this admission.  CHF team following.  Has diuresed well with inotrope (continues on) + IV diuretics, changed to po diuretics 3/12 (torsemide 80 BID) and seems ok.  On midodrine support as well with BPs reasonable.  CHF following - cont midodrine for now but no plans for home inotrope.  ? ?**AKI on CKD:  baseline CKD 4 (Cr 3.1-3.7, f/b [place]) now with AKI in setting of cardiorenal.  Has diuresed well with inotropic support + high dose diuretics (now high dose oral).  No current indications for RRT and he has discussed at length with palliative care - no HD now or ever.  Confirmed that with him today.  ? ?**A fib:  on apixiban, amio; no BB with low BPs, rate controlled.  ? ?**  HL: on statin ? ?**ongoing tobacco: has been provided cessation counseling.  ? ?Appears plan is home with hospice in the coming days.  I don't have further recommendations and given GOC will not arrange formal f/u with nephrology.  Let me know if nephrology can help.  ? ?Jannifer Hick MD ?09/04/2021, 9:22 AM  ?[facility] ?Pager: [phone] ? ? ?

## 2021-09-04 NOTE — Progress Notes (Addendum)
? ? Advanced Heart Failure Rounding Note ? ?PCP-Cardiologist: Kirk Ruths, MD  ? ?Subjective:   ? ?08/28/21- RHC with biventricular failure, marked volume overload, low CO by [place] (thermo ok), and mixed pulmonary HTN.  Started on milrinone 0.125 mcg.  ? ?3/13 Milrinone stopped.  ? ?CO-OX 82% ?Creatinine 4.6>4.9  ? ?No complaints. Denies SOB.  ? ? ?Objective:   ?Weight Range: ?69.9 kg ?Body mass index is 23.42 kg/m?.  ? ?Vital Signs:   ?Temp:  [98.4 ?F (36.9 ?C)-98.6 ?F (37 ?C)] 98.4 ?F (36.9 ?C) (03/14 0343) ?Pulse Rate:  [76-79] 79 (03/14 0343) ?Resp:  [18] 18 (03/14 0343) ?BP: (105-109)/(58-68) 105/68 (03/14 0343) ?SpO2:  [96 %-97 %] 97 % (03/14 0343) ?Weight:  [69.9 kg] 69.9 kg (03/14 0343) ?Last BM Date : 09/02/21 ? ?Weight change: ?Filed Weights  ? 09/02/21 0400 09/03/21 0431 09/04/21 0343  ?Weight: 71 kg 69.9 kg 69.9 kg  ? ? ?Intake/Output:  ? ?Intake/Output Summary (Last 24 hours) at 09/04/2021 1115 ?Last data filed at 09/04/2021 0834 ?Gross per 24 hour  ?Intake 530.06 ml  ?Output 1400 ml  ?Net -869.94 ml  ?  ? ? ?Physical Exam  ?CVP 2 ?General:   No resp difficulty ?HEENT: normal ?Neck: supple. no JVD. Carotids 2+ bilat; no bruits. No lymphadenopathy or thryomegaly appreciated. ?Cor: PMI nondisplaced. Regular rate & rhythm. No rubs, gallops or murmurs RIJ ?Lungs: clear ?Abdomen: soft, nontender, nondistended. No hepatosplenomegaly. No bruits or masses. Good bowel sounds. ?Extremities: no cyanosis, clubbing, rash, edema ?Neuro: alert & orientedx3, cranial nerves grossly intact. moves all 4 extremities w/o difficulty. Affect pleasant ? ? ? ? ?Telemetry  ? ?SR with occasional PVCs except at 0900 had 20 PVCs otherwise <5 per hour.  ? ? ?Labs  ?  ?CBC ?No results for input(s): WBC, NEUTROABS, HGB, HCT, MCV, PLT in the last 72 hours. ? ?Basic Metabolic Panel ?Recent Labs  ?  09/02/21 ?0410 09/03/21 ?0503 09/04/21 ?1610  ?NA 137 137 137  ?K 4.1 4.0 4.1  ?CL 96* 91* 90*  ?CO2 31 33* 34*  ?GLUCOSE 94 101* 88  ?BUN  73* 77* 83*  ?CREATININE 4.29* 4.61* 4.91*  ?CALCIUM 9.1 9.5 9.3  ?MG 2.6* 2.2  --   ?PHOS  --   --  3.3  ? ?Liver Function Tests ?Recent Labs  ?  09/04/21 ?9604  ?ALBUMIN 3.0*  ? ?No results for input(s): LIPASE, AMYLASE in the last 72 hours. ?Cardiac Enzymes ?No results for input(s): CKTOTAL, CKMB, CKMBINDEX, TROPONINI in the last 72 hours. ? ?BNP: ?BNP (last 3 results) ?Recent Labs  ?  08/24/21 ?0145  ?BNP 3,914.4*  ? ? ?ProBNP (last 3 results) ?No results for input(s): PROBNP in the last 8760 hours. ? ? ?D-Dimer ?No results for input(s): DDIMER in the last 72 hours. ?Hemoglobin A1C ?No results for input(s): HGBA1C in the last 72 hours. ?Fasting Lipid Panel ?No results for input(s): CHOL, HDL, LDLCALC, TRIG, CHOLHDL, LDLDIRECT in the last 72 hours. ?Thyroid Function Tests ?No results for input(s): TSH, T4TOTAL, T3FREE, THYROIDAB in the last 72 hours. ? ?Invalid input(s): FREET3 ? ?Other results: ? ? ?Imaging  ? ? ?No results found. ? ? ?Medications:   ? ? ?Scheduled Medications: ? amiodarone  200 mg Oral BID  ? apixaban  2.5 mg Oral BID  ? atorvastatin  80 mg Oral Daily  ? Chlorhexidine Gluconate Cloth  6 each Topical Daily  ? ezetimibe  10 mg Oral Daily  ? midodrine  10 mg Oral BID  WC  ? sodium chloride flush  3 mL Intravenous Q12H  ? sodium chloride flush  3 mL Intravenous Q12H  ? torsemide  80 mg Oral BID  ? ? ?Infusions: ? sodium chloride    ? sodium chloride    ? ? ?PRN Medications: ?sodium chloride, sodium chloride, acetaminophen, ondansetron (ZOFRAN) IV, oxyCODONE-acetaminophen, sodium chloride flush, sodium chloride flush ? ? ? ?Patient Profile  ? ?  ?81 y/o male with CKD IV, CAD with ischemic CM. PAF ?  ?Admitted with worsening HF in setting of non-compliance. Echo with EF 35%-> 20-25%. Poor response to IV lasix with persistent fluid overload and worsening AKI ? ?Assessment/Plan  ?Acute on chronic systolic CHF ?-EF previously 35-40% on echo 2014 and 2017 ?-Hx ICD 2014 ?-Felt to be ICM. Had MI and stent  in 2012 per chart review (no prior cath report available). Prior care in [place].  ?- Echo this admit with EF 20-25%, RV low normal, PASP 50 mmHg, mild MR, moderate TR ?- Admitted with a/c CHF in setting of noncompliance with medications. Had been off his medications for at least 3 months and restarted 2-3 weeks ago. Not sure how much recurrent AF contributing. Had < 1% burden on device check in January. In AF on admit. BNP > 3,900 on admit ?- RHC marked volume overload, low CO by thermo and mixed pulmonary HTN. Started on milrinone 0.125 mcg.  ?- Milrinone stopped 3/13. CO-OX stable.  ?- CVP 2. Renal function trending up.  Hold pm torsemide.  ?-No BB with low-output HF ?-GDMT limited by hypotension and AKI on CKD ?-On midodrine 10 mg TID for BP support ?-TED hose ?  ?2. CAD ?- Hx MI and stent in 2012 per chart review ?- No chest pain.  ?- On atorvastatin 80 + zetia ?- No aspirin d/t anticoagulation ?  ?3. Atrial fibrillation. paroxysmal ?-1% burden on last device check in January ?-in AF this admit, rate controlled.   ?-Suspect this is related to decompensation . Continue amio 200 bid.  ?-TSH okay ?-On eliquis 2.5 mg BID. No bleeding ?  ?4. AKI on CKD IV ?- Scr baseline 3.2, up to 4.8 with attempts to diurese. Scr stable at 4.5 -> 4.3>4.6>4.9  ?- Has AV fistula in place.  ?- Nephrology following. Patient does not want HD. ?- Continue midodrine for BP support ?- Hold pm torsemide.  ?  ?5. Thrombocytopenia ?- Platelets stable ~136 ?- Monitor ?  ?6. AAA ?- 4.0 cm on CT  ?  ?7. Tobacco use ?- Smoking cessation recommended ? ?8. NSVT/PVCs ?- may be related to milrinone, stopping today ?- keep K. 4.0 Mg >2.0 ?- PVC burden 25-30/min -> improved with amio  ?-  On amio 200 bid (started 3/11) for PAF.  ?- Continue  ? ?9. [place] ?-Palliative Care consulted, appreciate assistance ?-Remains full code. Family continuing [place] discussions. Palliative planning to meet with family again today. ?-He does not want HD ?-Not a candidate  for home milrinone ?- Plan for home with hospice. [facility].  ? ?From HF perspective should be ready for d/c in am.  ? ? ? ?Length of Stay: 11 ? ?Darrick Grinder, NP  ?09/04/2021, 11:15 AM ? ?Advanced Heart Failure Team ?Pager [phone] (M-F; 7a - 5p)  ?Please contact [facility] for night-coverage after hours (5p -7a ) and weekends on [email or web address] ? ?Patient seen and examined with the above-signed Advanced Practice Provider and/or Housestaff. I personally reviewed laboratory data, imaging studies and relevant notes. I  independently examined the patient and formulated the important aspects of the plan. I have edited the note to reflect any of my changes or salient points. I have personally discussed the plan with the patient and/or family. ? ? ?Denies CP or SOB. Co-ox stable off milrinone. CVP low. Scr up a bit. Remains in NSR ? ?General:  Sitting up No resp difficulty ?HEENT: normal ?Neck: supple. no JVD. Carotids 2+ bilat; no bruits. No lymphadenopathy or thryomegaly appreciated. ?Cor: PMI nondisplaced. Regular rate & rhythm. No rubs, gallops or murmurs. ?Lungs: clear ?Abdomen: soft, nontender, nondistended. No hepatosplenomegaly. No bruits or masses. Good bowel sounds. ?Extremities: no cyanosis, clubbing, rash, edema ?Neuro: alert & orientedx3, cranial nerves grossly intact. moves all 4 extremities w/o difficulty. Affect pleasant ? ?He is now off milrinone. Co-ox stable. Agree with holding diuretics with low CVP. Challenge will be to find there right dose of torsemide to keep fluid stable without hurting his renal function further. May not be easy. Will reassess diuretic dosing in am.  ? ?Glori Bickers, MD  ?2:14 PM ? ? ? ?

## 2021-09-04 NOTE — Progress Notes (Signed)
PT Cancellation Note ? ?Patient Details ?Name: Anthony Foster ?MRN: [id number] ?DOB: April 01, 1941 ? ? ?Cancelled Treatment:    Reason Eval/Treat Not Completed: Patient declined, no reason specified (pt declined mobility stating fatigue and that he has been up 3x today and not ready to move currently) ? ? ?Takai Chiaramonte B Imani Sherrin ?09/04/2021, 9:39 AM ?Quillan Whitter P, PT ?Acute Rehabilitation Services ?Pager: [phone] ?Office: [phone] ? ?

## 2021-09-04 NOTE — Plan of Care (Signed)

## 2021-09-04 NOTE — Progress Notes (Signed)
Mobility Specialist Progress Note: ? ? 09/04/21 1000  ?Mobility  ?Activity  ?(bed-level exercises)  ?Level of Assistance Independent  ?Activity Response Tolerated well  ?$Mobility charge 1 Mobility  ? ?Pt received in bed, refusing OOB mobility d/t needing rest. However, agreeable to bed level exercises. Pt tolerated well, encouraged OOB later today.  ? ?Anthony Foster ?Acute Rehab ?Phone: [phone] ?Office Phone: [phone] ? ?

## 2021-09-04 NOTE — Plan of Care (Signed)
?  Problem: Education: ?Goal: Knowledge of General Education information will improve ?Description: Including pain rating scale, medication(s)/side effects and non-pharmacologic comfort measures ?09/04/2021 1713 by Serafina Mitchell, RN ?Outcome: Progressing ?09/04/2021 1705 by Serafina Mitchell, RN ?Outcome: Progressing ?  ?Problem: Health Behavior/Discharge Planning: ?Goal: Ability to manage health-related needs will improve ?09/04/2021 1713 by Serafina Mitchell, RN ?Outcome: Progressing ?09/04/2021 1705 by Serafina Mitchell, RN ?Outcome: Progressing ?  ?Problem: Clinical Measurements: ?Goal: Ability to maintain clinical measurements within normal limits will improve ?09/04/2021 1713 by Serafina Mitchell, RN ?Outcome: Progressing ?09/04/2021 1705 by Serafina Mitchell, RN ?Outcome: Progressing ?Goal: Will remain free from infection ?09/04/2021 1713 by Serafina Mitchell, RN ?Outcome: Progressing ?09/04/2021 1705 by Serafina Mitchell, RN ?Outcome: Progressing ?Goal: Diagnostic test results will improve ?09/04/2021 1713 by Serafina Mitchell, RN ?Outcome: Progressing ?09/04/2021 1705 by Serafina Mitchell, RN ?Outcome: Progressing ?Goal: Respiratory complications will improve ?09/04/2021 1713 by Serafina Mitchell, RN ?Outcome: Progressing ?09/04/2021 1705 by Serafina Mitchell, RN ?Outcome: Progressing ?Goal: Cardiovascular complication will be avoided ?09/04/2021 1713 by Serafina Mitchell, RN ?Outcome: Progressing ?09/04/2021 1705 by Serafina Mitchell, RN ?Outcome: Progressing ?  ?Problem: Activity: ?Goal: Risk for activity intolerance will decrease ?09/04/2021 1713 by Serafina Mitchell, RN ?Outcome: Progressing ?09/04/2021 1705 by Serafina Mitchell, RN ?Outcome: Progressing ?  ?Problem: Nutrition: ?Goal: Adequate nutrition will be maintained ?09/04/2021 1713 by Serafina Mitchell, RN ?Outcome: Progressing ?09/04/2021 1705 by Serafina Mitchell, RN ?Outcome: Progressing ?  ?Problem: Coping: ?Goal: Level  of anxiety will decrease ?09/04/2021 1713 by Serafina Mitchell, RN ?Outcome: Progressing ?09/04/2021 1705 by Serafina Mitchell, RN ?Outcome: Progressing ?  ?Problem: Elimination: ?Goal: Will not experience complications related to bowel motility ?09/04/2021 1713 by Serafina Mitchell, RN ?Outcome: Progressing ?09/04/2021 1705 by Serafina Mitchell, RN ?Outcome: Progressing ?Goal: Will not experience complications related to urinary retention ?09/04/2021 1713 by Serafina Mitchell, RN ?Outcome: Progressing ?09/04/2021 1705 by Serafina Mitchell, RN ?Outcome: Progressing ?  ?Problem: Pain Managment: ?Goal: General experience of comfort will improve ?09/04/2021 1713 by Serafina Mitchell, RN ?Outcome: Progressing ?09/04/2021 1705 by Serafina Mitchell, RN ?Outcome: Progressing ?  ?Problem: Safety: ?Goal: Ability to remain free from injury will improve ?09/04/2021 1713 by Serafina Mitchell, RN ?Outcome: Progressing ?09/04/2021 1705 by Serafina Mitchell, RN ?Outcome: Progressing ?  ?Problem: Skin Integrity: ?Goal: Risk for impaired skin integrity will decrease ?09/04/2021 1713 by Serafina Mitchell, RN ?Outcome: Progressing ?09/04/2021 1705 by Serafina Mitchell, RN ?Outcome: Progressing ?  ?Problem: Education: ?Goal: Ability to demonstrate management of disease process will improve ?09/04/2021 1713 by Serafina Mitchell, RN ?Outcome: Progressing ?09/04/2021 1705 by Serafina Mitchell, RN ?Outcome: Progressing ?Goal: Ability to verbalize understanding of medication therapies will improve ?09/04/2021 1713 by Serafina Mitchell, RN ?Outcome: Progressing ?09/04/2021 1705 by Serafina Mitchell, RN ?Outcome: Progressing ?Goal: Individualized Educational Video(s) ?09/04/2021 1713 by Serafina Mitchell, RN ?Outcome: Progressing ?09/04/2021 1705 by Serafina Mitchell, RN ?Outcome: Progressing ?  ?Problem: Activity: ?Goal: Capacity to carry out activities will improve ?09/04/2021 1713 by Serafina Mitchell, RN ?Outcome:  Progressing ?09/04/2021 1705 by Serafina Mitchell, RN ?Outcome: Progressing ?  ?Problem: Cardiac: ?Goal: Ability to achieve and maintain adequate cardiopulmonary perfusion will improve ?09/04/2021 1713 by Serafina Mitchell, RN ?Outcome: Progressing ?09/04/2021 1705 by Serafina Mitchell, RN ?Outcome: Progressing ?  ?

## 2021-09-05 DIAGNOSIS — N179 Acute kidney failure, unspecified: Secondary | ICD-10-CM | POA: Diagnosis not present

## 2021-09-05 DIAGNOSIS — Z7189 Other specified counseling: Secondary | ICD-10-CM | POA: Diagnosis not present

## 2021-09-05 DIAGNOSIS — N186 End stage renal disease: Secondary | ICD-10-CM

## 2021-09-05 DIAGNOSIS — I5043 Acute on chronic combined systolic (congestive) and diastolic (congestive) heart failure: Secondary | ICD-10-CM | POA: Diagnosis not present

## 2021-09-05 DIAGNOSIS — Z515 Encounter for palliative care: Secondary | ICD-10-CM | POA: Diagnosis not present

## 2021-09-05 LAB — COOXEMETRY PANEL
Carboxyhemoglobin: 1.8 % — ABNORMAL HIGH (ref 0.5–1.5)
Methemoglobin: 1 % (ref 0.0–1.5)
O2 Saturation: 81.9 %
Total hemoglobin: 14.3 g/dL (ref 12.0–16.0)

## 2021-09-05 LAB — BASIC METABOLIC PANEL
Anion gap: 12 (ref 5–15)
BUN: 90 mg/dL — ABNORMAL HIGH (ref 8–23)
CO2: 34 mmol/L — ABNORMAL HIGH (ref 22–32)
Calcium: 9.2 mg/dL (ref 8.9–10.3)
Chloride: 88 mmol/L — ABNORMAL LOW (ref 98–111)
Creatinine, Ser: 5.09 mg/dL — ABNORMAL HIGH (ref 0.61–1.24)
GFR, Estimated: 11 mL/min — ABNORMAL LOW (ref >60)
Glucose, Bld: 90 mg/dL (ref 70–99)
Potassium: 4.1 mmol/L (ref 3.5–5.1)
Sodium: 134 mmol/L — ABNORMAL LOW (ref 135–145)

## 2021-09-05 NOTE — Progress Notes (Signed)
PT Cancellation Note ? ?Patient Details ?Name: Anthony Foster ?MRN: [id number] ?DOB: [birth year] ? ? ?Cancelled Treatment:    Reason Eval/Treat Not Completed: Other (comment).  Pt is having a meal, notes fatigue and then finally just did not have interest in doing therapy.  Follow up at another time. ? ? ?Ramond Dial ?09/05/2021, 6:13 PM ? ?Mee Hives, PT PhD ?Acute Rehab Dept. Number: [facility] [phone] and [place] [phone] ? ?

## 2021-09-05 NOTE — Progress Notes (Signed)
?PROGRESS NOTE ? ? ? 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  ?-In the ED chest x-ray noted small pleural effusions, UA with hematuria, CT abdomen noted cardiomegaly, anasarca,, moderate layering pleural effusions, small volume ascites and generalized body edema, hydroceles and infrarenal AAA. ?-Labs noted creatinine of 3.9, bicarb of 18, BNP 3914 ?-Cardiology, nephrology following, started on high-dose diuretics ?-Slow, poor response to diuretics, creatinine slowly worsening ? ? ?Subjective: Feels okay overall, denies any dyspnea, nausea or vomiting ? ?Assessment and Plan: ? ?* Acute on chronic combined systolic and diastolic CHF (congestive heart failure) ([place]) ?Biventricular failure ?Progressive CKD 4 ?-Last echo from 2017, noted EF of 35-40% with grade 2 diastolic dysfunction, repeat echo with EF down to 20-25% ?-Had poor response to high-dose diuretics,  ?-Heart failure team consulting, started on milrinone 3/7, subsequently weaned off on 3/13 ?-Diuresed well he is 16 L negative now, clinically appears euvolemic to dry ?-Diuretics on hold ?-Situation complicated by advanced CKD 4/5, nephrology was following, now signed off, eventual plan for home with hospice services ?-Remains on midodrine, beta-blocker discontinued ?-Restart torsemide in 1 to 2 days ?  ?CRI (chronic renal insufficiency), stage 4 (severe) (HCC) ?-Baseline creatinine around 3.5, followed by [facility], has a AV fistula for a few years  ?-Now creatinine in the 5-6 range ?-Nephrology was following, patient has adamantly declined hemodialysis ?-Seen  by palliative care as well, plan for home hospice services ?-See discussion above regarding diuretics ?  ?PAF (paroxysmal atrial fibrillation) (HCC) on chronic anticoagulation ?- Rate controlled continue Eliquis, amiodarone ? ?NSVT, PVCs ?-Improved on amiodarone, started this admission ?  ?AAA (abdominal aortic aneurysm) ?Patient has a known AAA last evaluated by ultrasound 05/2021 noted to be 4 cm at that time. ?  ?Thrombocytopenia ([place]) ?-Mild, improved ?   ?Essential hypertension ?- Soft but stable, continue midodrine, ?  ?ICD (implantable cardioverter-defibrillator) in place ?-s/p ICD for ischemic cardiomyopathy ?  ?Goals of care: Chronically ill elderly male with biventricular heart failure and advanced CKD 4/5, seen and followed by palliative care multiple times this admission, patient unable to make decision regarding CODE STATUS, remains full code but plan for home with hospice services and he is very clear that he would not do hemodialysis ?  ? ?DVT prophylaxis: Apixaban ?Code Status: Full code ?Family Communication: Discussed patient in detail, no family at bedside ?Disposition Plan:  ? ? ?Consultants:  ?Nephrology, heart failure service, palliative care ? ?Procedures:  ? ?Antimicrobials:  ? ? ?Objective: ?Vitals:  ? 09/04/21 0343 09/04/21 2035 09/05/21 0612 09/05/21 1141  ?BP: 105/68 (!) 101/51 111/65 115/69  ?Pulse: 79 72 73 81  ?Resp: '18 16  16  '$ ?Temp: 98.4 ?F (36.9 ?C) 98.4 ?F (36.9 ?C) 97.6 ?F (36.4 ?C) 97.9 ?F (36.6 ?C)  ?TempSrc: Oral Oral Oral Oral  ?SpO2: 97% 97% 99% 99%  ?Weight: 69.9 kg  68.1 kg   ?Height:      ? ? ?Intake/Output Summary (Last 24 hours) at 09/05/2021 1157 ?Last data filed at 09/05/2021 0900 ?Gross per 24 hour  ?Intake 460 ml  ?Output 1400 ml  ?Net -940 ml  ? ?Filed Weights  ?  09/03/21 0431 09/04/21 0343 09/05/21 0612  ?Weight: 69.9 kg 69.9 kg 68.1 kg  ? ? ?Examination: ? ?General exam: Pleasant chronically ill male laying in bed, AAO x2, no distress ?HEENT: No JVD ?CVS: S1-S2,  regular rate rhythm ?Lungs: Clear bilaterally ?Abdomen: Soft, nontender, bowel sounds present ?Extremities: No edema ?Skin: No rashes ?Psychiatry: Mood & affect appropriate.  ? ? ? ?Data Reviewed:  ? ?CBC: ?Recent Labs  ?Lab 08/30/21 ?9326 09/01/21 ?0154  ?WBC 8.3 8.6  ?HGB 12.1* 12.7*  ?HCT 35.9* 37.7*  ?MCV 80.7 80.0  ?PLT 133* 136*  ? ?Basic Metabolic Panel: ?Recent Labs  ?Lab 08/30/21 ?0444 08/31/21 ?7124 09/01/21 ?0154 09/02/21 ?0410 09/03/21 ?0503 09/04/21 ?5809 09/05/21 ?0442  ?NA 137 140 137 137 137 137 134*  ?K 4.1 4.0 4.1 4.1 4.0 4.1 4.1  ?CL 104 103 99 96* 91* 90* 88*  ?CO2 '24 27 28 31 '$ 33* 34* 34*  ?GLUCOSE 109* 79 105* 94 101* 88 90  ?BUN 74* 73* 73* 73* 77* 83* 90*  ?CREATININE 4.86* 4.51* 4.57* 4.29* 4.61* 4.91* 5.09*  ?CALCIUM 9.0 9.1 8.9 9.1 9.5 9.3 9.2  ?MG 2.2 2.1 1.8 2.6* 2.2  --   --   ?PHOS  --   --   --   --   --  3.3  --   ? ?GFR: ?Estimated Creatinine Clearance: 10.8 mL/min (A) (by C-G formula based on SCr of 5.09 mg/dL (H)). ?Liver Function Tests: ?Recent Labs  ?Lab 09/04/21 ?9833  ?ALBUMIN 3.0*  ? ?No results for input(s): LIPASE, AMYLASE in the last 168 hours. ?No results for input(s): AMMONIA in the last 168 hours. ?Coagulation Profile: ?No results for input(s): INR, PROTIME in the last 168 hours. ?Cardiac Enzymes: ?No results for input(s): CKTOTAL, CKMB, CKMBINDEX, TROPONINI in the last 168 hours. ?BNP (last 3 results) ?No results for input(s): PROBNP in the last 8760 hours. ?HbA1C: ?No results for input(s): HGBA1C in the last 72 hours. ?CBG: ?No results for input(s): GLUCAP in the last 168 hours. ?Lipid Profile: ?No results for input(s): CHOL, HDL, LDLCALC, TRIG, CHOLHDL, LDLDIRECT in the last 72 hours. ?Thyroid Function Tests: ?No results for input(s): TSH, T4TOTAL, FREET4, T3FREE, THYROIDAB in the last 72 hours. ?Anemia Panel: ?No results for input(s): VITAMINB12, FOLATE, FERRITIN, TIBC, IRON, RETICCTPCT in the last 72 hours. ?Urine analysis: ?   ?Component Value Date/Time  ?  COLORURINE AMBER (A) 08/24/2021 0404  ? APPEARANCEUR HAZY (A) 08/24/2021 0404  ? LABSPEC 1.018 08/24/2021 0404  ? PHURINE 5.0 08/24/2021 0404  ? [place] NEGATIVE 08/24/2021 0404  ? HGBUR LARGE (A) 08/24/2021 0404  ? [place] NEGATIVE 08/24/2021 0404  ? [place] NEGATIVE 08/24/2021 0404  ? PROTEINUR 100 (A) 08/24/2021 0404  ? NITRITE NEGATIVE 08/24/2021 0404  ? LEUKOCYTESUR NEGATIVE 08/24/2021 0404  ? ?Sepsis Labs: ?'@LABRCNTIP'$ (procalcitonin:4,lacticidven:4) ? ?)No results found for this or any previous visit (from the past 240 hour(s)).  ? ?Radiology Studies: ?No results found. ? ? ?Scheduled Meds: ? amiodarone  200 mg Oral BID  ? apixaban  2.5 mg Oral BID  ? atorvastatin  80 mg Oral Daily  ? Chlorhexidine Gluconate Cloth  6 each Topical Daily  ? ezetimibe  10 mg Oral Daily  ? midodrine  10 mg Oral BID WC  ? sodium chloride flush  3 mL Intravenous Q12H  ? sodium chloride flush  3 mL Intravenous Q12H  ? ?Continuous Infusions: ? sodium chloride    ? sodium chloride    ? ? ? LOS: 12 days  ? ? ?  Time spent: 13mn ? ? ? ?PDomenic Polite MD ?[facility] ? ? ?09/05/2021, 11:57 AM  ?  ?

## 2021-09-05 NOTE — Progress Notes (Signed)
Dressing to right IJ was changed by Kathlee Nations the RN on night of 09/04/21. Today the tubing for the CVP was changed due to unable to get an accurate reading at 0800. Cardiology was in the room and notified them later of reading after progression. ? ?Pt alert and oriented x 4 and requires no additional oxygenation. Pt stated that he woke up early in the morning around 0300 on 09/05/21 ?

## 2021-09-05 NOTE — Progress Notes (Signed)
Patient ID: Anthony Foster, male   DOB: 12-17-1940, 81 y.o.   MRN: [id number] ? ? ? ?Progress Note from the Palliative Medicine Team at [facility] ? ? ?Patient Name: Anthony Foster        ?Date: 09/05/2021 ?DOB: 1940/06/29  Age: 81 y.o. MRN#: [id number] ?Attending Physician: Anthony Polite, MD ?Primary Care Physician: Anthony Low, MD ?Admit Date: 08/24/2021 ? ? ?Medical records reviewed  ? ? 81 y.o. male  admitted on 08/24/2021 with  ?Past medical history significant of combined systolic and diastolic CHF last EF 68-08% with grade 2 diastolic dysfunction, A-fib,  s/p AICD, COPD, AAA, CKD stage IV with fistula in place not yet on dialysis, and tobacco use who presents with complaints of swelling over the last 3 days.  Patient reports initial swelling in his legs progressing into his scrotum.  Increasing shortness of breath and abdominal distention. ?  ?Of note he reports in November of 2022 he had a home bout of diarrhea for which he had stopped taking all of his medicines.  However he recently restarted in the last 2-3 weeks and has been taking them as prescribed.  He reports that he recently had an echocardiogram.  Unclear if this was possibly done at the [place].  He has a right upper extremity fistula in place for about 3 years now, but has not needed to start on dialysis.  He is followed by [facility]. ?  ?Admitted through the emergency room for treatment and  stabilization.   Marked fluid volume overload, mixed pulmonary hypertension.  Started on milrinone and diuresed with IV Lasix. ?  ?Chronic kidney disease, patient declines dialysis now and into the future. Plan is home with hospice.  ? ?Visited and assessed patient at bedside.  He continues to weaken, poor po intake.  ? ?Continued  education  offered on hospice benefit; philosophy and eligibility both in the home and residential.   Family are putting together a plan for patient to discharge home, education regarding increasing  anticipatory care needs offered. ? ? ?Plan of care ?-Full code ?    -Educated patient/family to consider DNR/DNI status understanding evidenced based poor outcomes in similar hospitalized patient, as the cause of arrest is likely associated with advanced chronic illness rather than an easily reversible acute cardio-pulmonary event.  ?       Again today patient does not wish to make decision regarding CODE STATUS.  His son tells me that he will be in town next week and will help his father put necessary documents in place.  A blank MOST form is left at the bedside ?-No dialysis now or in the future ?-Discharge home with hospice when medically maximized.  [facility] in communication with family ? ?No documented healthcare power of attorney, patient is able to verbalize that he would like for his grandson Anthony Foster and his son Anthony Foster to work together to make decisions in the event the patient cannot make decisions for himself. ? ?MOST form completed still in EMR--needs an update but patient declines, he says he will discuss with son next week  ? ?Emotional support offered to patient, he shares his feelings/anxiety with anticipation home.   ? ?Discussed with attending via secure chat ? ?Questions and concerns addressed    ? ?PMT will continue to support holistically ? ? ?Anthony Lessen NP  ?Palliative Medicine Team Team Phone # [phone] ?Pager [phone] ?  ?

## 2021-09-05 NOTE — Progress Notes (Addendum)
? ? Advanced Heart Failure Rounding Note ? ?PCP-Cardiologist: Kirk Ruths, MD  ? ?Subjective:   ? ?08/28/21- RHC with biventricular failure, marked volume overload, low CO by Fick (thermo ok), and mixed pulmonary HTN.  Started on milrinone 0.125 mcg.  ? ?3/13 Milrinone stopped.  ? ?CO-OX 82% today  ?Creatinine 4.6>4.9>5.1 ?BUN 77>>83>>40  ?K 4.1  ? ?Diuretics on hold w/ low CVP, still w/ 1L in UOP yesterday. Wt continues to trend down. CVP not working correctly this morning. RN working to troubleshoot.  ? ?He says he feels fine. Denies dyspnea. No orthopnea/PND. Only complaint is being unable to sleep and rest comfortably in hospital bed.  ? ?Objective:   ?Weight Range: ?68.1 kg ?Body mass index is 22.84 kg/m?.  ? ?Vital Signs:   ?Temp:  [97.6 ?F (36.4 ?C)-98.4 ?F (36.9 ?C)] 97.6 ?F (36.4 ?C) (03/15 9562) ?Pulse Rate:  [72-73] 73 (03/15 0612) ?Resp:  [16] 16 (03/14 2035) ?BP: (101-111)/(51-65) 111/65 (03/15 0612) ?SpO2:  [97 %-99 %] 99 % (03/15 0612) ?Weight:  [68.1 kg] 68.1 kg (03/15 0612) ?Last BM Date : 09/04/21 ? ?Weight change: ?Filed Weights  ? 09/03/21 0431 09/04/21 0343 09/05/21 0612  ?Weight: 69.9 kg 69.9 kg 68.1 kg  ? ? ?Intake/Output:  ? ?Intake/Output Summary (Last 24 hours) at 09/05/2021 0804 ?Last data filed at 09/04/2021 2036 ?Gross per 24 hour  ?Intake 480 ml  ?Output 1400 ml  ?Net -920 ml  ?  ? ? ?Physical Exam  ? ? ?General:  Well appearing elderly male. No respiratory difficulty ?HEENT: normal ?Neck: supple. JVD 8-9 cm + Rt IJ CVC Carotids 2+ bilat; no bruits. No lymphadenopathy or thyromegaly appreciated. ?Cor: PMI nondisplaced. Regular rate & rhythm. No rubs, gallops or murmurs. ?Lungs: clear ?Abdomen: soft, nontender, nondistended. No hepatosplenomegaly. No bruits or masses. Good bowel sounds. ?Extremities: no cyanosis, clubbing, rash, edema ?Neuro: alert & oriented x 3, cranial nerves grossly intact. moves all 4 extremities w/o difficulty. Affect pleasant. ? ? ?Telemetry  ? ?NSR 70s,  personally reviewed .  ? ? ?Labs  ?  ?CBC ?No results for input(s): WBC, NEUTROABS, HGB, HCT, MCV, PLT in the last 72 hours. ? ?Basic Metabolic Panel ?Recent Labs  ?  09/03/21 ?0503 09/04/21 ?1308 09/05/21 ?0442  ?NA 137 137 134*  ?K 4.0 4.1 4.1  ?CL 91* 90* 88*  ?CO2 33* 34* 34*  ?GLUCOSE 101* 88 90  ?BUN 77* 83* 90*  ?CREATININE 4.61* 4.91* 5.09*  ?CALCIUM 9.5 9.3 9.2  ?MG 2.2  --   --   ?PHOS  --  3.3  --   ? ?Liver Function Tests ?Recent Labs  ?  09/04/21 ?6578  ?ALBUMIN 3.0*  ? ?No results for input(s): LIPASE, AMYLASE in the last 72 hours. ?Cardiac Enzymes ?No results for input(s): CKTOTAL, CKMB, CKMBINDEX, TROPONINI in the last 72 hours. ? ?BNP: ?BNP (last 3 results) ?Recent Labs  ?  08/24/21 ?0145  ?BNP 3,914.4*  ? ? ?ProBNP (last 3 results) ?No results for input(s): PROBNP in the last 8760 hours. ? ? ?D-Dimer ?No results for input(s): DDIMER in the last 72 hours. ?Hemoglobin A1C ?No results for input(s): HGBA1C in the last 72 hours. ?Fasting Lipid Panel ?No results for input(s): CHOL, HDL, LDLCALC, TRIG, CHOLHDL, LDLDIRECT in the last 72 hours. ?Thyroid Function Tests ?No results for input(s): TSH, T4TOTAL, T3FREE, THYROIDAB in the last 72 hours. ? ?Invalid input(s): FREET3 ? ?Other results: ? ? ?Imaging  ? ? ?No results found. ? ? ?Medications:   ? ? ?  Scheduled Medications: ? amiodarone  200 mg Oral BID  ? apixaban  2.5 mg Oral BID  ? atorvastatin  80 mg Oral Daily  ? Chlorhexidine Gluconate Cloth  6 each Topical Daily  ? ezetimibe  10 mg Oral Daily  ? midodrine  10 mg Oral BID WC  ? sodium chloride flush  3 mL Intravenous Q12H  ? sodium chloride flush  3 mL Intravenous Q12H  ? ? ?Infusions: ? sodium chloride    ? sodium chloride    ? ? ?PRN Medications: ?sodium chloride, sodium chloride, acetaminophen, ondansetron (ZOFRAN) IV, oxyCODONE-acetaminophen, sodium chloride flush, sodium chloride flush ? ? ? ?Patient Profile  ? ?  ?81 y/o male with CKD IV, CAD with ischemic CM. PAF ?  ?Admitted with worsening HF  in setting of non-compliance. Echo with EF 35%-> 20-25%. Poor response to IV lasix with persistent fluid overload and worsening AKI ? ?Assessment/Plan  ?Acute on chronic systolic CHF ?-EF previously 35-40% on echo 2014 and 2017 ?-Hx ICD 2014 ?-Felt to be ICM. Had MI and stent in 2012 per chart review (no prior cath report available). Prior care in [place].  ?- Echo this admit with EF 20-25%, RV low normal, PASP 50 mmHg, mild MR, moderate TR ?- Admitted with a/c CHF in setting of noncompliance with medications. Had been off his medications for at least 3 months and restarted 2-3 weeks ago. Not sure how much recurrent AF contributing. Had < 1% burden on device check in January. In AF on admit. BNP > 3,900 on admit ?- RHC marked volume overload, low CO by thermo and mixed pulmonary HTN. Started on milrinone 0.125 mcg.  ?- Milrinone stopped 3/13. CO-OX stable.  ?- CVP not working currently but JVD appears elevated, 8-9 cm. Renal function trending up. No dyspnea. Will need to resume PO torsemide. RN to troubleshoot CVP  ?-No BB with low-output HF ?-GDMT limited by hypotension and AKI on CKD ?-On midodrine 10 mg TID for BP support ?-TED hose ?  ?2. CAD ?- Hx MI and stent in 2012 per chart review ?- No chest pain.  ?- On atorvastatin 80 + zetia ?- No aspirin d/t anticoagulation ?  ?3. Atrial fibrillation. paroxysmal ?-1% burden on last device check in January ?-in AF this admit, rate controlled.   ?-Suspect this is related to decompensation . Continue amio 200 bid.  ?-TSH okay ?-On eliquis 2.5 mg BID. No bleeding ?  ?4. AKI on CKD IV ?- Scr baseline 3.2, up to 4.8 with attempts to diurese. Scr stable at 4.5 -> 4.3>4.6>4.9>5.1  ?- Has AV fistula in place.  ?- Nephrology following. Patient does not want HD. ?- Continue midodrine for BP support ?  ?5. Thrombocytopenia ?- Platelets stable ~136 ?- Monitor ?  ?6. AAA ?- 4.0 cm on CT  ?  ?7. Tobacco use ?- Smoking cessation recommended ? ?8. NSVT/PVCs ?- may be related to  milrinone, stopping today ?- keep K. 4.0 Mg >2.0 ?- PVC burden 25-30/min -> improved with amio  ?-  On amio 200 bid (started 3/11) for PAF.  ? ? ?9. [employer] ?-Palliative Care consulted, appreciate assistance ?-Remains full code. Family continuing [place] discussions. Palliative following  ?-He does not want HD ?-Not a candidate for home milrinone ?-Plan for home with hospice. [facility].  ? ? ? ?Length of Stay: 12 ? ?Lyda Jester, PA-C  ?09/05/2021, 8:04 AM ? ?Advanced Heart Failure Team ?Pager [phone] (M-F; 7a - 5p)  ?Please contact [facility] Cardiology for  night-coverage after hours (5p -7a ) and weekends on [email or web address] ? ? ?Patient seen and examined with the above-signed Advanced Practice Provider and/or Housestaff. I personally reviewed laboratory data, imaging studies and relevant notes. I independently examined the patient and formulated the important aspects of the plan. I have edited the note to reflect any of my changes or salient points. I have personally discussed the plan with the patient and/or family. ? ?Denies SOB, orthopnea or PND.  CVP remains low. Creatinine creeping up. Co-ox ok off milrinone ? ?General:  Lying in bed . No resp difficulty ?HEENT: normal ?Neck: supple. RIJ TLC  Carotids 2+ bilat; no bruits. No lymphadenopathy or thryomegaly appreciated. ?Cor: PMI nondisplaced. Regular rate & rhythm. No rubs, gallops or murmurs. ?Lungs: clear ?Abdomen: soft, nontender, nondistended. No hepatosplenomegaly. No bruits or masses. Good bowel sounds. ?Extremities: no cyanosis, clubbing, rash, edema ?Neuro: alert & orientedx3, cranial nerves grossly intact. moves all 4 extremities w/o difficulty. Affect pleasant ? ?Agree with holding diuretics for now. Follow renal function. Can send home once renal function stabilized from our standpoint. Will need to restart torsemide in next few days as CVP climbs.  ? ?Glori Bickers, MD  ?9:35 AM ? ? ? ? ? ?

## 2021-09-06 DIAGNOSIS — N179 Acute kidney failure, unspecified: Secondary | ICD-10-CM | POA: Diagnosis not present

## 2021-09-06 DIAGNOSIS — I5043 Acute on chronic combined systolic (congestive) and diastolic (congestive) heart failure: Secondary | ICD-10-CM | POA: Diagnosis not present

## 2021-09-06 DIAGNOSIS — Z7189 Other specified counseling: Secondary | ICD-10-CM | POA: Diagnosis not present

## 2021-09-06 DIAGNOSIS — Z515 Encounter for palliative care: Secondary | ICD-10-CM | POA: Diagnosis not present

## 2021-09-06 LAB — BASIC METABOLIC PANEL
Anion gap: 11 (ref 5–15)
BUN: 98 mg/dL — ABNORMAL HIGH (ref 8–23)
CO2: 34 mmol/L — ABNORMAL HIGH (ref 22–32)
Calcium: 9.3 mg/dL (ref 8.9–10.3)
Chloride: 91 mmol/L — ABNORMAL LOW (ref 98–111)
Creatinine, Ser: 5.46 mg/dL — ABNORMAL HIGH (ref 0.61–1.24)
GFR, Estimated: 10 mL/min — ABNORMAL LOW (ref >60)
Glucose, Bld: 90 mg/dL (ref 70–99)
Potassium: 4.2 mmol/L (ref 3.5–5.1)
Sodium: 136 mmol/L (ref 135–145)

## 2021-09-06 LAB — COOXEMETRY PANEL
Carboxyhemoglobin: 1.4 % (ref 0.5–1.5)
Methemoglobin: <0.7 % (ref 0.0–1.5)
O2 Saturation: 76.5 %
Total hemoglobin: 14.1 g/dL (ref 12.0–16.0)

## 2021-09-06 MED ORDER — SODIUM CHLORIDE 0.9 % IV BOLUS
500.0000 mL | Freq: Once | INTRAVENOUS | Status: AC
  Administered 2021-09-06: 500 mL via INTRAVENOUS

## 2021-09-06 NOTE — Progress Notes (Signed)
PT Cancellation Note ? ?Patient Details ?Name: Anthony Foster ?MRN: [id number] ?DOB: 1941/02/04 ? ? ?Cancelled Treatment:    Reason Eval/Treat Not Completed: Patient declined, no reason specified. Pt states he doesn't feel like mobilizing and when asked if he would like for acute PT to continue attempting to mobilize he states "No. The more you come offer, the more I will refuse." Pt educated on importance of maintaining mobility and strength while hospitalized and pt states he has been doing exercises in bed and ambulating in the room during the night. Politely asked acute PT to sign off, feel free to re-consult if change in pt desire or mobility status.  ? ?[name], PT, DPT  ? ?Acute Rehabilitation Department ?Pager #: [phone] - [phone] ? ? ?[name] ?09/06/2021, 4:51 PM ?

## 2021-09-06 NOTE — Progress Notes (Signed)
Manufacturing engineer [facility]) Hospital Liaison ?  ?Per MD, plan is for patient to receive IVF today in hopes of improving creatinine. Plan is for patient to return home tomorrow. Per previous discussions with HLT/Mary [facility] Silversmith, family denied any DME needs today. MSW contacted family/[name] [phone]) to confirm there weren't any new needs and family confirmed.  ? ?Patient is NOT a DNR and will NOT need a signed DNR form sent with him upon DC. Please provide prescriptions at discharge as needed to ensure ongoing symptom management and a transport packet. ?  ?[facility] information and contact numbers given to family and above information shared with TOC.  ?  ?Please call with any questions/concerns.  ?  ?Thank you for the opportunity to participate in this patient's care ?  ?Daphene Calamity, MSW ?[facility] Liaison  ?[phone] ? ?

## 2021-09-06 NOTE — Progress Notes (Addendum)
? ? Advanced Heart Failure Rounding Note ? ?PCP-Cardiologist: Kirk Ruths, MD  ? ?Subjective:   ? ?08/28/21- RHC with biventricular failure, marked volume overload, low CO by Fick (thermo ok), and mixed pulmonary HTN.  Started on milrinone 0.125 mcg.  ?3/13 Milrinone stopped.  ? ?CO-OX 76.5% today  ?Creatinine 4.6>4.9>5.1>5.5 (he has been off diureticssince 3/14 ) ?BUN 98  ? ?Denies SOB. Denies pain.  ? ? ? ?Objective:   ?Weight Range: ?67.9 kg ?Body mass index is 22.78 kg/m?.  ? ?Vital Signs:   ?Temp:  [97.8 ?F (36.6 ?C)-98.3 ?F (36.8 ?C)] 97.8 ?F (36.6 ?C) (03/16 8546) ?Pulse Rate:  [74-81] 76 (03/16 0508) ?Resp:  [16-18] 18 (03/16 0508) ?BP: (106-118)/(61-69) 106/62 (03/16 2703) ?SpO2:  [99 %] 99 % (03/16 0508) ?Weight:  [67.9 kg] 67.9 kg (03/16 0508) ?Last BM Date : 09/04/21 ? ?Weight change: ?Filed Weights  ? 09/04/21 0343 09/05/21 0612 09/06/21 0508  ?Weight: 69.9 kg 68.1 kg 67.9 kg  ? ? ?Intake/Output:  ? ?Intake/Output Summary (Last 24 hours) at 09/06/2021 0902 ?Last data filed at 09/06/2021 0513 ?Gross per 24 hour  ?Intake 460 ml  ?Output 900 ml  ?Net -440 ml  ?  ? ? ?Physical Exam  ?CVP 2 ?General:   No resp difficulty ?HEENT: normal ?Neck: supple. no JVD. Carotids 2+ bilat; no bruits. No lymphadenopathy or thryomegaly appreciated. RIJ ?Cor: PMI nondisplaced. Regular rate & rhythm. No rubs, gallops or murmurs. ?Lungs: clear ?Abdomen: soft, nontender, nondistended. No hepatosplenomegaly. No bruits or masses. Good bowel sounds. ?Extremities: no cyanosis, clubbing, rash, edema ?Neuro: alert & orientedx3, cranial nerves grossly intact. moves all 4 extremities w/o difficulty. Affect pleasant ? ? ?Telemetry  ? ?SR 70-80s personally checked.  ? ? ?Labs  ?  ?CBC ?No results for input(s): WBC, NEUTROABS, HGB, HCT, MCV, PLT in the last 72 hours. ? ?Basic Metabolic Panel ?Recent Labs  ?  09/04/21 ?5009 09/05/21 ?3818 09/06/21 ?0433  ?NA 137 134* 136  ?K 4.1 4.1 4.2  ?CL 90* 88* 91*  ?CO2 34* 34* 34*  ?GLUCOSE 88 90  90  ?BUN 83* 90* 98*  ?CREATININE 4.91* 5.09* 5.46*  ?CALCIUM 9.3 9.2 9.3  ?PHOS 3.3  --   --   ? ?Liver Function Tests ?Recent Labs  ?  09/04/21 ?2993  ?ALBUMIN 3.0*  ? ?No results for input(s): LIPASE, AMYLASE in the last 72 hours. ?Cardiac Enzymes ?No results for input(s): CKTOTAL, CKMB, CKMBINDEX, TROPONINI in the last 72 hours. ? ?BNP: ?BNP (last 3 results) ?Recent Labs  ?  08/24/21 ?0145  ?BNP 3,914.4*  ? ? ?ProBNP (last 3 results) ?No results for input(s): PROBNP in the last 8760 hours. ? ? ?D-Dimer ?No results for input(s): DDIMER in the last 72 hours. ?Hemoglobin A1C ?No results for input(s): HGBA1C in the last 72 hours. ?Fasting Lipid Panel ?No results for input(s): CHOL, HDL, LDLCALC, TRIG, CHOLHDL, LDLDIRECT in the last 72 hours. ?Thyroid Function Tests ?No results for input(s): TSH, T4TOTAL, T3FREE, THYROIDAB in the last 72 hours. ? ?Invalid input(s): FREET3 ? ?Other results: ? ? ?Imaging  ? ? ?No results found. ? ? ?Medications:   ? ? ?Scheduled Medications: ? amiodarone  200 mg Oral BID  ? apixaban  2.5 mg Oral BID  ? atorvastatin  80 mg Oral Daily  ? Chlorhexidine Gluconate Cloth  6 each Topical Daily  ? ezetimibe  10 mg Oral Daily  ? midodrine  10 mg Oral BID WC  ? sodium chloride flush  3 mL  Intravenous Q12H  ? sodium chloride flush  3 mL Intravenous Q12H  ? ? ?Infusions: ? sodium chloride    ? sodium chloride    ? ? ?PRN Medications: ?sodium chloride, sodium chloride, acetaminophen, ondansetron (ZOFRAN) IV, oxyCODONE-acetaminophen, sodium chloride flush, sodium chloride flush ? ? ? ?Patient Profile  ? ?  ?81 y/o male with CKD IV, CAD with ischemic CM. PAF ?  ?Admitted with worsening HF in setting of non-compliance. Echo with EF 35%-> 20-25%. Poor response to IV lasix with persistent fluid overload and worsening AKI ? ?Assessment/Plan  ?Acute on chronic systolic CHF ?-EF previously 35-40% on echo 2014 and 2017 ?-Hx ICD 2014 ?-Felt to be ICM. Had MI and stent in 2012 per chart review (no prior cath  report available). Prior care in [place].  ?- Echo this admit with EF 20-25%, RV low normal, PASP 50 mmHg, mild MR, moderate TR ?- Admitted with a/c CHF in setting of noncompliance with medications. Had been off his medications for at least 3 months and restarted 2-3 weeks ago. Not sure how much recurrent AF contributing. Had < 1% burden on device check in January. In AF on admit. BNP > 3,900 on admit ?- RHC marked volume overload, low CO by thermo and mixed pulmonary HTN. Started on milrinone 0.125 mcg.  Milrinone stopped 3/13. CO-OX stable.  ?- CVP 2. Continue to hold diuretics. Worsening renal function.  ?- No BB with low-output HF ?-GDMT limited by hypotension and AKI on CKD ?-On midodrine 10 mg TID for BP support ?-TED hose ?  ?2. CAD ?- Hx MI and stent in 2012 per chart review ?- No chest pain.  ?- On atorvastatin 80 + zetia ?- No aspirin d/t anticoagulation ?  ?3. Atrial fibrillation. paroxysmal ?-1% burden on last device check in January ?-in AF this admit, rate controlled.   ?-Suspect this is related to decompensation . Continue amio 200 bid.  ?-TSH okay ?-On eliquis 2.5 mg BID. No bleeding ?  ?4. AKI on CKD IV ?- Scr baseline 3.2, up to 4.8 with attempts to diurese. Scr up to 5.5 today.  ?- Has AV fistula in place.  ?- Nephrology signed off. Patient does not want HD. ?- Continue midodrine for BP support ?  ?5. Thrombocytopenia ?- Platelets stable ~136 ? ?  ?6. AAA ?- 4.0 cm on CT  ?  ?7. Tobacco use ?- Smoking cessation recommended ? ?8. NSVT/PVCs ?- may be related to milrinone, stopping today ?- keep K. 4.0 Mg >2.0 ?- PVC burden 25-30/min -> improved with amio  ?-  On amio 200 bid (started 3/11) for PAF.  ? ?9. [place] ?-Palliative Care consulted, appreciate assistance ?-Remains full code. Family continuing [place] discussions. Palliative following  ?-He does not want HD ?-Not a candidate for home milrinone ?-Plan for home with hospice. [facility].  ? ?Worsening renal function. ? [facility]/C to [facility] if renal function continues to decline.  ? ?Length of Stay: 13 ? ?Darrick Grinder, NP  ?09/06/2021, 9:02 AM ? ?Advanced Heart Failure Team ?Pager [phone] (M-F; 7a - 5p)  ?Please contact [facility] for night-coverage after hours (5p -7a ) and weekends on [email or web address] ? ?Patient seen and examined with the above-signed Advanced Practice Provider and/or Housestaff. I personally reviewed laboratory data, imaging studies and relevant notes. I independently examined the patient and formulated the important aspects of the plan. I have edited the note to reflect any of my changes or salient points. I have personally discussed the  plan with the patient and/or family. ? ?Off milrinone. Co-ox stable but renal function worsening. Denies SOB, orthopnea or PND. CVP 2 ? ?General:  Sitting up in bed . No resp difficulty ?HEENT: normal ?Neck: supple. no JVD. Carotids 2+ bilat; no bruits. No lymphadenopathy or thryomegaly appreciated. ?Cor: PMI nondisplaced. Regular rate & rhythm. No rubs, gallops or murmurs. ?Lungs: clear ?Abdomen: soft, nontender, nondistended. No hepatosplenomegaly. No bruits or masses. Good bowel sounds. ?Extremities: no cyanosis, clubbing, rash, edema ?Neuro: alert & orientedx3, cranial nerves grossly intact. moves all 4 extremities w/o difficulty. Affect pleasant ? ?Off milrinone. Co-ox stable but renal function worsening. CVP very low. Will give some fluid back to see if we can stable renal function. Otherwise will need to consdier Hospice placement as he is not interested in HD.  ? ?Glori Bickers, MD  ?9:26 AM ? ? ? ? ? ?

## 2021-09-06 NOTE — Progress Notes (Signed)
Mobility Specialist Progress Note: ? ? 09/06/21 1340  ?Mobility  ?Activity  ?(bed level exercises)  ?Range of Motion/Exercises Active;All extremities  ?Level of Assistance Independent  ?Assistive Device None  ?Activity Response Tolerated well  ?$Mobility charge 1 Mobility  ? ?Pt still refusing OOB mobility today. States that he gets OOB on his own at 3am, and that staff doesn't believe him. Pt performed active bed level exercises with all extremities, tolerated well. Encouraged OOB mobility, pt voiced understanding. Left in bed with all needs met.  ? ?Nelta Numbers ?Acute Rehab ?Phone: [phone] ?Office Phone: [phone] ? ?

## 2021-09-06 NOTE — Plan of Care (Signed)

## 2021-09-06 NOTE — Progress Notes (Signed)
?PROGRESS NOTE ? ? ? ?Anthony Foster  KYH:[id number] DOB: [birth year] DOA: 08/24/2021 ?PCP: Wenda Low, MD  ?Narrative80/M with history of chronic combined systolic and diastolic CHF, EF 15-17%, grade 2 diastolic dysfunction, paroxysmal A-fib, history of AICD, COPD,, CKD 4 with hemodialysis fistula in place, AAA presented to the ED with progressive edema for the last 3 days, starting in his legs extending up to his belly including scrotum.  ?-In the ED chest x-ray noted small pleural effusions, UA with hematuria, CT abdomen noted cardiomegaly, anasarca,, moderate layering pleural effusions, small volume ascites and generalized body edema, hydroceles and infrarenal AAA. ?-Found to be in low output state, treated with milrinone and Lasix gtt., volume status has improved, creatinine has slowly worsened, followed by nephrology, patient continues to decline hemodialysis, palliative care consulted, plan for home with hospice services soon ? ? ?Subjective: Feels okay, no events overnight ? ?Assessment and Plan: ? ?* Acute on chronic combined systolic and diastolic CHF (congestive heart failure) ([place]) ?Biventricular failure ?Progressive CKD 4 ?-Last echo from 2017, noted EF of 35-40% with grade 2 diastolic dysfunction, repeat echo with EF down to 20-25% ?-Had poor response to high-dose diuretics,  ?-Heart failure team consulting, started on milrinone 3/7, subsequently weaned off on 3/13 ?-Diuresed well he is 16.7 L negative now, clinically appears euvolemic to dry ?-Diuretics on hold ?-Situation complicated by advanced CKD 4/5, nephrology was following, now signed off, eventual plan for home with hospice services ?-Remains on midodrine, beta-blocker discontinued ?-Getting gentle IV fluids today, monitor kidney function ?-Home with hospice services likely tomorrow ?  ?CRI (chronic renal insufficiency), stage 4 (severe) (HCC) ?-Baseline creatinine around 3.5, followed by [facility], has a AV fistula for  a few years  ?-Now creatinine in the 5-6 range ?-Nephrology was following, patient has adamantly declined hemodialysis ?-Seen by palliative care as well, plan for home hospice services ?-See discussion above regarding diuretics ?  ?PAF (paroxysmal atrial fibrillation) (HCC) on chronic anticoagulation ?- Rate controlled continue Eliquis, amiodarone ? ?NSVT, PVCs ?-Improved on amiodarone, started this admission ?  ?AAA (abdominal aortic aneurysm) ?Patient has a known AAA last evaluated by ultrasound 05/2021 noted to be 4 cm at that time. ?  ?Thrombocytopenia ([place]) ?-Mild, improved ?   ?Essential hypertension ?- Soft but stable, continue midodrine, ?  ?ICD (implantable cardioverter-defibrillator) in place ?-s/p ICD for ischemic cardiomyopathy ?  ?Goals of care: Chronically ill elderly male with biventricular heart failure and advanced CKD 4/5, seen and followed by palliative care multiple times this admission, patient unable to make decision regarding CODE STATUS, remains full code but plan for home with hospice services and he is very clear that he would not do hemodialysis ?  ? ?DVT prophylaxis: Apixaban ?Code Status: Full code ?Family Communication: Discussed patient in detail, no family at bedside ?Disposition Plan: Home with hospice services likely 3/17 ? ? ?Consultants:  ?Nephrology, heart failure service, palliative care ? ?Procedures:  ? ?Antimicrobials:  ? ? ?Objective: ?Vitals:  ? 09/05/21 2012 09/06/21 0508 09/06/21 0944 09/06/21 1126  ?BP: 118/61 106/62 101/60 108/62  ?Pulse: 74 76 75 74  ?Resp: '18 18 18 17  '$ ?Temp: 98.3 ?F (36.8 ?C) 97.8 ?F (36.6 ?C) 98.2 ?F (36.8 ?C) 98.3 ?F (36.8 ?C)  ?TempSrc: Oral Oral Oral Oral  ?SpO2: 99% 99% 99% 99%  ?Weight:  67.9 kg    ?Height:      ? ? ?Intake/Output Summary (Last 24 hours) at 09/06/2021 1206 ?Last data filed at 09/06/2021 1132 ?Gross per  24 hour  ?Intake 580 ml  ?Output 1100 ml  ?Net -520 ml  ? ?Filed Weights  ? 09/04/21 0343 09/05/21 0612 09/06/21 0508  ?Weight:  69.9 kg 68.1 kg 67.9 kg  ? ? ?Examination: ? ?General exam: Pleasant chronically ill male laying in bed, AAO x2, no distress ?HEENT: No JVD, right IJ central line ?CVS: S1-S2, regular rate rhythm ?Lungs: Clear bilaterally ?Abdomen: Soft, nontender, bowel sounds present ?Extremities: No edema ?Skin: No rashes ?Psychiatry: Mood & affect appropriate.  ? ? ? ?Data Reviewed:  ? ?CBC: ?Recent Labs  ?Lab 09/01/21 ?0154  ?WBC 8.6  ?HGB 12.7*  ?HCT 37.7*  ?MCV 80.0  ?PLT 136*  ? ?Basic Metabolic Panel: ?Recent Labs  ?Lab 08/31/21 ?4097 09/01/21 ?0154 09/02/21 ?0410 09/03/21 ?0503 09/04/21 ?3532 09/05/21 ?9924 09/06/21 ?0433  ?NA 140 137 137 137 137 134* 136  ?K 4.0 4.1 4.1 4.0 4.1 4.1 4.2  ?CL 103 99 96* 91* 90* 88* 91*  ?CO2 '27 28 31 '$ 33* 34* 34* 34*  ?GLUCOSE 79 105* 94 101* 88 90 90  ?BUN 73* 73* 73* 77* 83* 90* 98*  ?CREATININE 4.51* 4.57* 4.29* 4.61* 4.91* 5.09* 5.46*  ?CALCIUM 9.1 8.9 9.1 9.5 9.3 9.2 9.3  ?MG 2.1 1.8 2.6* 2.2  --   --   --   ?PHOS  --   --   --   --  3.3  --   --   ? ?GFR: ?Estimated Creatinine Clearance: 10 mL/min (A) (by C-G formula based on SCr of 5.46 mg/dL (H)). ?Liver Function Tests: ?Recent Labs  ?Lab 09/04/21 ?2683  ?ALBUMIN 3.0*  ? ?No results for input(s): LIPASE, AMYLASE in the last 168 hours. ?No results for input(s): AMMONIA in the last 168 hours. ?Coagulation Profile: ?No results for input(s): INR, PROTIME in the last 168 hours. ?Cardiac Enzymes: ?No results for input(s): CKTOTAL, CKMB, CKMBINDEX, TROPONINI in the last 168 hours. ?BNP (last 3 results) ?No results for input(s): PROBNP in the last 8760 hours. ?HbA1C: ?No results for input(s): HGBA1C in the last 72 hours. ?CBG: ?No results for input(s): GLUCAP in the last 168 hours. ?Lipid Profile: ?No results for input(s): CHOL, HDL, LDLCALC, TRIG, CHOLHDL, LDLDIRECT in the last 72 hours. ?Thyroid Function Tests: ?No results for input(s): TSH, T4TOTAL, FREET4, T3FREE, THYROIDAB in the last 72 hours. ?Anemia Panel: ?No results for input(s):  VITAMINB12, FOLATE, FERRITIN, TIBC, IRON, RETICCTPCT in the last 72 hours. ?Urine analysis: ?   ?Component Value Date/Time  ? COLORURINE AMBER (A) 08/24/2021 0404  ? APPEARANCEUR HAZY (A) 08/24/2021 0404  ? LABSPEC 1.018 08/24/2021 0404  ? PHURINE 5.0 08/24/2021 0404  ? [place] NEGATIVE 08/24/2021 0404  ? HGBUR LARGE (A) 08/24/2021 0404  ? [place] NEGATIVE 08/24/2021 0404  ? [place] NEGATIVE 08/24/2021 0404  ? PROTEINUR 100 (A) 08/24/2021 0404  ? NITRITE NEGATIVE 08/24/2021 0404  ? LEUKOCYTESUR NEGATIVE 08/24/2021 0404  ? ?Sepsis Labs: ?'@LABRCNTIP'$ (procalcitonin:4,lacticidven:4) ? ?)No results found for this or any previous visit (from the past 240 hour(s)).  ? ?Radiology Studies: ?No results found. ? ? ?Scheduled Meds: ? amiodarone  200 mg Oral BID  ? apixaban  2.5 mg Oral BID  ? atorvastatin  80 mg Oral Daily  ? Chlorhexidine Gluconate Cloth  6 each Topical Daily  ? ezetimibe  10 mg Oral Daily  ? midodrine  10 mg Oral BID WC  ? sodium chloride flush  3 mL Intravenous Q12H  ? sodium chloride flush  3 mL Intravenous Q12H  ? ?Continuous Infusions: ?  sodium chloride    ? sodium chloride    ? ? ? LOS: 13 days  ? ? ?Time spent: 35mn ? ?PDomenic Polite MD ?[facility] ? ? ?09/06/2021, 12:06 PM  ?  ?

## 2021-09-07 DIAGNOSIS — I5043 Acute on chronic combined systolic (congestive) and diastolic (congestive) heart failure: Secondary | ICD-10-CM | POA: Diagnosis not present

## 2021-09-07 DIAGNOSIS — N179 Acute kidney failure, unspecified: Secondary | ICD-10-CM | POA: Diagnosis not present

## 2021-09-07 DIAGNOSIS — Z7189 Other specified counseling: Secondary | ICD-10-CM | POA: Diagnosis not present

## 2021-09-07 DIAGNOSIS — Z515 Encounter for palliative care: Secondary | ICD-10-CM | POA: Diagnosis not present

## 2021-09-07 LAB — COOXEMETRY PANEL
Carboxyhemoglobin: 1.6 % — ABNORMAL HIGH (ref 0.5–1.5)
Methemoglobin: 0.9 % (ref 0.0–1.5)
O2 Saturation: 84.1 %
Total hemoglobin: 13.8 g/dL (ref 12.0–16.0)

## 2021-09-07 LAB — BASIC METABOLIC PANEL
Anion gap: 12 (ref 5–15)
BUN: 104 mg/dL — ABNORMAL HIGH (ref 8–23)
CO2: 30 mmol/L (ref 22–32)
Calcium: 9.2 mg/dL (ref 8.9–10.3)
Chloride: 94 mmol/L — ABNORMAL LOW (ref 98–111)
Creatinine, Ser: 5.67 mg/dL — ABNORMAL HIGH (ref 0.61–1.24)
GFR, Estimated: 9 mL/min — ABNORMAL LOW (ref >60)
Glucose, Bld: 94 mg/dL (ref 70–99)
Potassium: 4.3 mmol/L (ref 3.5–5.1)
Sodium: 136 mmol/L (ref 135–145)

## 2021-09-07 MED ORDER — TORSEMIDE 40 MG PO TABS: 40.0000 mg | ORAL_TABLET | Freq: Every day | ORAL | 0 refills | Status: AC

## 2021-09-07 MED ORDER — AMIODARONE HCL 200 MG PO TABS: 200.0000 mg | ORAL_TABLET | Freq: Two times a day (BID) | ORAL | 0 refills | Status: DC

## 2021-09-07 MED ORDER — MIDODRINE HCL 10 MG PO TABS: 10.0000 mg | ORAL_TABLET | Freq: Two times a day (BID) | ORAL | 0 refills | Status: DC

## 2021-09-07 NOTE — Progress Notes (Signed)
Patient's grandson [name] contacted by this RN per his request regarding patient discharge. Discharge AVS explained to patient's grandson, all questions answered. Patient's grandson will be here in 1.5hrs to pick up patient. Nolberto Cheuvront Loel Ro ? ?

## 2021-09-07 NOTE — TOC CM/SW Note (Addendum)
HF TOC CM spoke to pt and states his son is best contact. Pt prefers his meds go to [facility] at dc. Attempted call to grandson, [name]. Left message for return call. Contacted [facility] to make aware of scheduled dc home today. Scheduled dc today with Home [facility]osp[facility]. Jonnie Finner RN3 CCM, Heart Failure TOC CM [phone]  ?

## 2021-09-07 NOTE — Progress Notes (Signed)
R IJ TL CVC removed per protocol per MD order. Manual pressure applied for 5 mins. Vaseline gauze, gauze, and Tegaderm applied over insertion site. No bleeding or swelling noted. Instructed patient to remain in bed for thirty mins. Educated patient about S/S of infection and when to call MD; no heavy lifting or pressure on right side for 24 hours; keep dressing dry and intact for 24 hours. Pt verbalized comprehension.  ?

## 2021-09-07 NOTE — Care Management Important Message (Signed)
Important Message ? ?Patient Details  ?Name: Anthony Foster ?MRN: [id number] ?Date of Birth: 01/18/1941 ? ? ?Medicare Important Message Given:  Yes ? ? ? ? ?[name] ?09/07/2021, 7:54 AM ?

## 2021-09-07 NOTE — Progress Notes (Signed)
[facility] ([facility])  ? ?Per TOC/Alesia, plan is for patient to D/C today. [place] staff aware. Per TOC request, [place] staff to contact GC/[name] to confirm admission visit upon D/C. ? ?Patient is NOT a DNR and will NOT need a signed DNR form sent with him upon DC. Please provide prescriptions at discharge as needed to ensure ongoing symptom management and a transport packet. ?  ?Please call with any questions/concerns.  ?  ?Thank you for the opportunity to participate in this patient's care ?  ?Daphene Calamity, MSW ?[facility] Liaison  ?[phone] ? ?

## 2021-09-07 NOTE — Plan of Care (Signed)

## 2021-09-07 NOTE — Progress Notes (Signed)
Mobility Specialist Progress Note: ? ? 09/07/21 1145  ?Mobility  ?Activity Transferred to/from [facility]  ?Level of Assistance Independent  ?Assistive Device None  ?Distance Ambulated (ft) 2 ft  ?Activity Response Tolerated well  ?$Mobility charge 1 Mobility  ? ?Pt received transferring independently to [facility] with no AD. Declined further mobility. Pt left with all needs met, eager for d/c. ? ?Nelta Numbers ?Acute Rehab ?Phone: [phone] ?Office Phone: [phone] ? ?

## 2021-09-07 NOTE — Discharge Summary (Signed)
Physician Discharge Summary  ?Anthony Foster SWN:[id number] DOB: Oct 15, 1940 DOA: 08/24/2021 ? ?PCP: Wenda Low, MD ? ?Admit date: 08/24/2021 ?Discharge date: 09/07/2021 ? ?Time spent: 35 minutes ? ?Recommendations for Outpatient Follow-up:  ?Home with hospice services for comfort and symptom focused care ? ? ?Discharge Diagnoses:  ?Principal Problem: ?  Acute on chronic combined systolic and diastolic CHF (congestive heart failure) ([place]) ?  Biventricular heart failure ?  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  Acute kidney injury superimposed on chronic kidney disease ([place]) ?  PAF (paroxysmal atrial fibrillation) (HCC) on chronic anticoagulation ?  Essential hypertension ?  Hyperlipidemia ?  AAA (abdominal aortic aneurysm) ?  Thrombocytopenia ([place]) ?  Tobacco use ? ? ?Discharge Condition: Stable ? ?Diet recommendation: Low-sodium, heart healthy ? ?Filed Weights  ? 09/05/21 0612 09/06/21 0508 09/07/21 0417  ?Weight: 68.1 kg 67.9 kg 68.7 kg  ? ? ?History of present illness:  ?Narrative80/M with history of chronic combined systolic and diastolic CHF, EF 93-81%, grade 2 diastolic dysfunction, paroxysmal A-fib, history of AICD, COPD,, CKD 4 with hemodialysis fistula in place, AAA presented to the ED with progressive edema for the last 3 days, starting in his legs extending up to his belly including scrotum.  ?-In the ED chest x-ray noted small pleural effusions, UA with hematuria, CT abdomen noted cardiomegaly, anasarca,, moderate layering pleural effusions, small volume ascites and generalized body edema, hydroceles and infrarenal AAA. ? ?Hospital Course:  ? ?Acute on chronic combined systolic and diastolic CHF (congestive heart failure) ([place]) ?Biventricular failure ?Progressive CKD 4 ?-Last echo from 2017, noted EF of 35-40% with grade 2 diastolic dysfunction, repeat echo with EF down to 20-25% ?-Had poor response to high-dose diuretics,  ?-Heart failure team consulting, started on milrinone 3/7, subsequently weaned off on  3/13 ?-Diuresed well he is 16.7 L negative now, clinically appears euvolemic to dry ?-Situation complicated by advanced CKD 4/5, nephrology was following, now signed off, recommended hospice care, patient has repeatedly refused dialysis ?-Remains on midodrine, beta-blocker discontinued ?-Clinically remained stable however has near end-stage kidney disease and advanced cardiomyopathy, continues to refuse hemodialysis, seen by palliative care, plan for discharge home with hospice services, will resume torsemide 40 Mg daily tomorrow ?  ?CRI (chronic renal insufficiency), stage 4 (severe) (HCC) ?-Baseline creatinine around 3.5, followed by [facility], has a AV fistula for a few years  ?-Now creatinine in the 5-6 range ?-Nephrology was following, patient has adamantly declined hemodialysis ?-Seen by palliative care as well, plan for home hospice services ?  ?PAF (paroxysmal atrial fibrillation) (HCC) on chronic anticoagulation ?- Rate controlled continue Eliquis, amiodarone ?  ?NSVT, PVCs ?-Improved on amiodarone, started this admission ?  ?AAA (abdominal aortic aneurysm) ?Patient has a known AAA last evaluated by ultrasound 05/2021 noted to be 4 cm at that time. ?  ?Thrombocytopenia ([place]) ?-Mild, improved ?   ?Essential hypertension ?- Soft but stable, continue midodrine, ?  ?ICD (implantable cardioverter-defibrillator) in place ?-s/p ICD for ischemic cardiomyopathy ?  ?Goals of care: Chronically ill elderly male with biventricular heart failure and advanced CKD 4/5, seen and followed by palliative care multiple times this admission,  remains full code but plan for home with hospice services and he is very clear that he would not do hemodialysis ?  ? ?Discharge Exam: ?Vitals:  ? 09/07/21 0417 09/07/21 1101  ?BP: (!) 108/59 (!) 104/55  ?Pulse: 75 72  ?Resp:  17  ?Temp: 98 ?F (36.7 ?C) 98.3 ?F (36.8 ?C)  ?SpO2: 99% 98%  ? ?  General exam: Pleasant chronically ill male laying in bed, AAO x2, no  distress ?HEENT: No JVD, right IJ central line ?CVS: S1-S2, regular rate rhythm ?Lungs: Clear bilaterally ?Abdomen: Soft, nontender, bowel sounds present ?Extremities: No edema ?Skin: No rashes ?Psychiatry: Mood & affect appropriate.  ?  ? ? ?Discharge Instructions ? ? ?Discharge Instructions   ? ? Diet - low sodium heart healthy   Complete by: As directed ?  ? Increase activity slowly   Complete by: As directed ?  ? ?  ? ?Allergies as of 09/07/2021   ? ?   Reactions  ? Hymenoptera Venom Preparations Hives  ? Reaction to wasps  ? Lisinopril Other (See Comments)  ? angioedema  ? Piroxicam Other (See Comments)  ? burn  ? Shellfish Allergy Swelling  ? Amoxicillin-pot Clavulanate Rash  ? Did it involve swelling of the face/tongue/throat, SOB, or low BP? No ?Did it involve sudden or severe rash/hives, skin peeling, or any reaction on the inside of your mouth or nose? Yes ?Did you need to seek medical attention at a hospital or doctor's office? Yes ?When did it last happen?      over 10 years ?If all above answers are ?NO?, may proceed with cephalosporin use.  ? ?  ? ?  ?Medication List  ?  ? ?STOP taking these medications   ? ?carvedilol 25 MG tablet ?Commonly known as: COREG ?  ?ezetimibe 10 MG tablet ?Commonly known as: ZETIA ?  ?furosemide 40 MG tablet ?Commonly known as: LASIX ?  ?hydrALAZINE 25 MG tablet ?Commonly known as: APRESOLINE ?  ?isosorbide mononitrate 60 MG 24 hr tablet ?Commonly known as: IMDUR ?  ?potassium chloride 10 MEQ tablet ?Commonly known as: KLOR-CON M ?  ? ?  ? ?TAKE these medications   ? ?allopurinol 300 MG tablet ?Commonly known as: ZYLOPRIM ?Take 300 mg by mouth daily. ?  ?amiodarone 200 MG tablet ?Commonly known as: PACERONE ?Take 1 tablet (200 mg total) by mouth 2 (two) times daily. ?  ?apixaban 2.5 MG Tabs tablet ?Commonly known as: Eliquis ?Take 1 tablet (2.5 mg total) by mouth daily. ?  ?atorvastatin 80 MG tablet ?Commonly known as: LIPITOR ?Take 1 tablet (80 mg total) by mouth daily. ?   ?midodrine 10 MG tablet ?Commonly known as: PROAMATINE ?Take 1 tablet (10 mg total) by mouth 2 (two) times daily with a meal. ?  ?nitroGLYCERIN 0.4 MG SL tablet ?Commonly known as: NITROSTAT ?Place 1 tablet (0.4 mg total) under the tongue every 5 (five) minutes as needed for chest pain. ?  ?oxyCODONE-acetaminophen 5-325 MG tablet ?Commonly known as: PERCOCET/ROXICET ?Take 1 tablet by mouth every 6 (six) hours as needed. ?  ?ProAir RespiClick 161 (90 Base) MCG/ACT Aepb ?Generic drug: Albuterol Sulfate ?Inhale 1 puff into the lungs every 4 (four) hours as needed (shortness of breath). ?  ?Torsemide 40 MG Tabs ?Take 40 mg by mouth daily. ?Start taking on: September 08, 2021 ?  ? ?  ? ?Allergies  ?Allergen Reactions  ? Hymenoptera Venom Preparations Hives  ?  Reaction to wasps  ? Lisinopril Other (See Comments)  ?  angioedema  ? Piroxicam Other (See Comments)  ?  burn  ? Shellfish Allergy Swelling  ? Amoxicillin-Pot Clavulanate Rash  ?  Did it involve swelling of the face/tongue/throat, SOB, or low BP? No ?Did it involve sudden or severe rash/hives, skin peeling, or any reaction on the inside of your mouth or nose? Yes ?Did you need to seek medical  attention at a hospital or doctor's office? Yes ?When did it last happen?      over 10 years ?If all above answers are ?NO?, may proceed with cephalosporin use. ?  ? ? Follow-up Information   ? ? Wenda Low, MD. Daphane Shepherd on 09/11/2021.   ?Specialty: Internal Medicine ?Why: '@9'$ :15am ?Contact information: ?[address] ?[address] ?[place] [address] ?[phone] ? ? ?  ?  ? ? Lelon Perla, MD .   ?Specialty: Cardiology ?Contact information: ?[address] ?[place] [address] ?[phone] ? ? ?  ?  ? ? [facility] Follow up on 09/21/2021.   ?Specialty: Cardiology ?Why: at 9:30. Located on the 1st floor at [facility]. Entrance C ?Contact information: ?[address] ?[id number] mc ?[place] ?[phone] ? ?  ?  ? ? [facility] Follow up.   ?Specialty: Hospice and Palliative Medicine ?Why: Home Hospice RN will call and arrange initial visit ?Contact information: ?[address] ?[place]

## 2021-09-07 NOTE — Progress Notes (Signed)
Patient [id number] Anthony Foster      DOB: [birth year]      TTS:[id number] ? ? ? ?  ?Palliative Medicine Team ? ? ? ? ?Subjective: Bedside symptom check. ? ? ?Physical exam: Patient sitting up on side of bed when entered room for visit. Patient denies any pain, discomfort, needs at this time. ? ? ?Assessment and plan: Per patient and bedside RN, waiting for discharge order to be placed and transport home with home hospice consult to [place]. Patient in agreement of all of this and excited to be discharged. Patient denies any further concerns or needs at this time. Will continue to follow through discharge home.  ? ? ?Thank you for allowing the Palliative Medicine Team to assist in the care of this patient. ?  ?  ?Damian Leavell, MSN, RN ?Palliative Medicine Team ?Team Phone: [phone]  ?This phone is monitored 7a-7p, please reach out to attending physician outside of these hours for urgent needs.   ?

## 2021-09-07 NOTE — Progress Notes (Addendum)
? ? Advanced Heart Failure Rounding Note ? ?PCP-Cardiologist: Kirk Ruths, MD  ? ?Subjective:   ? ?08/28/21- RHC with biventricular failure, marked volume overload, low CO by Fick (thermo ok), and mixed pulmonary HTN.  Started on milrinone 0.125 mcg.  ?3/13 Milrinone stopped.  ?3/16 Given IV Fluids . CVP low.  ? ?CO-OX 84% ?Creatinine 4.6>4.9>5.1>5.5>5.7  (he has been off diureticssince 3/14 ) ?BUN 104  ? ?No complaints. Denies nausea  ? ? ? ?Objective:   ?Weight Range: ?68.7 kg ?Body mass index is 23.02 kg/m?.  ? ?Vital Signs:   ?Temp:  [97.8 ?F (36.6 ?C)-98.3 ?F (36.8 ?C)] 98 ?F (36.7 ?C) (03/17 0417) ?Pulse Rate:  [70-75] 75 (03/17 0417) ?Resp:  [17-18] 18 (03/16 2011) ?BP: (104-108)/(55-62) 108/59 (03/17 0417) ?SpO2:  [99 %-100 %] 99 % (03/17 0417) ?Weight:  [68.7 kg] 68.7 kg (03/17 0417) ?Last BM Date : 09/06/21 ? ?Weight change: ?Filed Weights  ? 09/05/21 0612 09/06/21 0508 09/07/21 0417  ?Weight: 68.1 kg 67.9 kg 68.7 kg  ? ? ?Intake/Output:  ? ?Intake/Output Summary (Last 24 hours) at 09/07/2021 0958 ?Last data filed at 09/07/2021 0421 ?Gross per 24 hour  ?Intake 620 ml  ?Output 725 ml  ?Net -105 ml  ?  ? ? ?Physical Exam  ?CVP 5-6  ?General:  No resp difficulty. R subclavian central line.  ?HEENT: normal ?Neck: supple. no JVD. Carotids 2+ bilat; no bruits. No lymphadenopathy or thryomegaly appreciated. ?Cor: PMI nondisplaced. Regular rate & rhythm. No rubs, gallops or murmurs. ?Lungs: clear ?Abdomen: soft, nontender, nondistended. No hepatosplenomegaly. No bruits or masses. Good bowel sounds. ?Extremities: no cyanosis, clubbing, rash, edema ?Neuro: alert & orientedx3, cranial nerves grossly intact. moves all 4 extremities w/o difficulty. Affect pleasant ? ? ? ?Telemetry  ? SR 70s personally  checked  ? ? ?Labs  ?  ?CBC ?No results for input(s): WBC, NEUTROABS, HGB, HCT, MCV, PLT in the last 72 hours. ? ?Basic Metabolic Panel ?Recent Labs  ?  09/06/21 ?0433 09/07/21 ?0416  ?NA 136 136  ?K 4.2 4.3  ?CL 91*  94*  ?CO2 34* 30  ?GLUCOSE 90 94  ?BUN 98* 104*  ?CREATININE 5.46* 5.67*  ?CALCIUM 9.3 9.2  ? ?Liver Function Tests ?No results for input(s): AST, ALT, ALKPHOS, BILITOT, PROT, ALBUMIN in the last 72 hours. ? ?No results for input(s): LIPASE, AMYLASE in the last 72 hours. ?Cardiac Enzymes ?No results for input(s): CKTOTAL, CKMB, CKMBINDEX, TROPONINI in the last 72 hours. ? ?BNP: ?BNP (last 3 results) ?Recent Labs  ?  08/24/21 ?0145  ?BNP 3,914.4*  ? ? ?ProBNP (last 3 results) ?No results for input(s): PROBNP in the last 8760 hours. ? ? ?D-Dimer ?No results for input(s): DDIMER in the last 72 hours. ?Hemoglobin A1C ?No results for input(s): HGBA1C in the last 72 hours. ?Fasting Lipid Panel ?No results for input(s): CHOL, HDL, LDLCALC, TRIG, CHOLHDL, LDLDIRECT in the last 72 hours. ?Thyroid Function Tests ?No results for input(s): TSH, T4TOTAL, T3FREE, THYROIDAB in the last 72 hours. ? ?Invalid input(s): FREET3 ? ?Other results: ? ? ?Imaging  ? ? ?No results found. ? ? ?Medications:   ? ? ?Scheduled Medications: ? amiodarone  200 mg Oral BID  ? apixaban  2.5 mg Oral BID  ? atorvastatin  80 mg Oral Daily  ? Chlorhexidine Gluconate Cloth  6 each Topical Daily  ? ezetimibe  10 mg Oral Daily  ? midodrine  10 mg Oral BID WC  ? sodium chloride flush  3 mL Intravenous  Q12H  ? sodium chloride flush  3 mL Intravenous Q12H  ? ? ?Infusions: ? sodium chloride    ? sodium chloride    ? ? ?PRN Medications: ?sodium chloride, sodium chloride, acetaminophen, ondansetron (ZOFRAN) IV, oxyCODONE-acetaminophen, sodium chloride flush, sodium chloride flush ? ? ? ?Patient Profile  ? ?  ?81 y/o male with CKD IV, CAD with ischemic CM. PAF ?  ?Admitted with worsening HF in setting of non-compliance. Echo with EF 35%-> 20-25%. Poor response to IV lasix with persistent fluid overload and worsening AKI ? ?Assessment/Plan  ?Acute on chronic systolic CHF ?-EF previously 35-40% on echo 2014 and 2017 ?-Hx ICD 2014 ?-Felt to be ICM. Had MI and stent in  2012 per chart review (no prior cath report available). Prior care in [place].  ?- Echo this admit with EF 20-25%, RV low normal, PASP 50 mmHg, mild MR, moderate TR ?- Admitted with a/c CHF in setting of noncompliance with medications. Had been off his medications for at least 3 months and restarted 2-3 weeks ago. Not sure how much recurrent AF contributing. Had < 1% burden on device check in January. In AF on admit. BNP > 3,900 on admit ?- RHC marked volume overload, low CO by thermo and mixed pulmonary HTN. Started on milrinone 0.125 mcg.  Milrinone stopped 3/13. CO-OX remains stable.  ?- CVP 5-6.  Continue to hold diuretics. Tomorrow would start torsemide 40 mg daily  ?- No BB with low-output HF ?-GDMT limited by hypotension and AKI on CKD ?-On midodrine 10 mg TID for BP support ?-TED hose ?  ?2. CAD ?- Hx MI and stent in 2012 per chart review ?- No chest pain.  ?- On atorvastatin 80 + zetia ?- No aspirin d/t anticoagulation ?  ?3. Atrial fibrillation. paroxysmal ?-1% burden on last device check in January ?-in AF this admit, rate controlled.   ?-Suspect this is related to decompensation . Continue amio 200 bid.  ?-TSH okay ?-On eliquis 2.5 mg BID. No bleeding ?  ?4. AKI on CKD IV ?- Scr baseline 3.2, up to 4.8 with attempts to diurese. Creatinine continues to trend up.   ?- Continues to make urine.  ?- Has AV fistula in place.  ?- Nephrology signed off. Patient does not want HD. ?- Continue midodrine for BP support.  ?  ?5. Thrombocytopenia ?- Platelets stable ~136 ?  ?6. AAA ?- 4.0 cm on CT  ?  ?7. Tobacco use ?- Smoking cessation recommended ? ?8. NSVT/PVCs ?- may be related to milrinone, stopping today ?- keep K. 4.0 Mg >2.0 ?- PVC burden 25-30/min -> improved with amio  ?-  On amio 200 bid (started 3/11) for PAF.  ? ?9. [place] ?-Palliative Care consulted, appreciate assistance ?-Remains full code. Family continuing [place] discussions. Palliative following  ?-He does not want HD ?-Not a candidate for home  milrinone ?-Plan for home with hospice. [facility].  ? ? ? D/C to [facility] if renal function continues to decline.  ? ?Length of Stay: 14 ? ?Darrick Grinder, NP  ?09/07/2021, 9:58 AM ? ?Advanced Heart Failure Team ?Pager [phone] (M-F; 7a - 5p)  ?Please contact [facility] for night-coverage after hours (5p -7a ) and weekends on [email or web address] ? ?Patient seen and examined with the above-signed Advanced Practice Provider and/or Housestaff. I personally reviewed laboratory data, imaging studies and relevant notes. I independently examined the patient and formulated the important aspects of the plan. I have edited the note to reflect any of  my changes or salient points. I have personally discussed the plan with the patient and/or family. ? ? ?Remains off milrinone. Co-ox remains high. Received IVF yesterday for low CVP and worsening SCr.  Scr continues to climb 5.46 -> 5.67  Denies CP or SOB. Remains in NSR on po amio. No bleeding with apixaban.  ? ?CVP 5-6 ? ?General:  Elderly male lying in bed No resp difficulty ?HEENT: normal ?Neck: supple. no JVD. Carotids 2+ bilat; no bruits. No lymphadenopathy or thryomegaly appreciated. ?Cor: PMI nondisplaced. Regular rate & rhythm. No rubs, gallops or murmurs. ?Lungs: clear ?Abdomen: soft, nontender, nondistended. No hepatosplenomegaly. No bruits or masses. Good bowel sounds. ?Extremities: no cyanosis, clubbing, rash, edema ?Neuro: alert & orientedx3, cranial nerves grossly intact. moves all 4 extremities w/o difficulty. Affect pleasant ? ?Renal function continues to deteriorate. We have little left to offer from HF standpoint. He appears end-stage. Agree with plan to start torsemide 40 daily tomorrow. Agree with discharge with Hospice f/u.  ? ?Glori Bickers, MD  ?10:25 AM ? ? ?

## 2021-09-08 DIAGNOSIS — I132 Hypertensive heart and chronic kidney disease with heart failure and with stage 5 chronic kidney disease, or end stage renal disease: Secondary | ICD-10-CM | POA: Diagnosis not present

## 2021-09-08 DIAGNOSIS — M109 Gout, unspecified: Secondary | ICD-10-CM | POA: Diagnosis not present

## 2021-09-08 DIAGNOSIS — I4891 Unspecified atrial fibrillation: Secondary | ICD-10-CM | POA: Diagnosis not present

## 2021-09-08 DIAGNOSIS — I714 Abdominal aortic aneurysm, without rupture, unspecified: Secondary | ICD-10-CM | POA: Diagnosis not present

## 2021-09-08 DIAGNOSIS — I2510 Atherosclerotic heart disease of native coronary artery without angina pectoris: Secondary | ICD-10-CM | POA: Diagnosis not present

## 2021-09-08 DIAGNOSIS — E785 Hyperlipidemia, unspecified: Secondary | ICD-10-CM | POA: Diagnosis not present

## 2021-09-08 DIAGNOSIS — J449 Chronic obstructive pulmonary disease, unspecified: Secondary | ICD-10-CM | POA: Diagnosis not present

## 2021-09-08 DIAGNOSIS — I5040 Unspecified combined systolic (congestive) and diastolic (congestive) heart failure: Secondary | ICD-10-CM | POA: Diagnosis not present

## 2021-09-08 DIAGNOSIS — N185 Chronic kidney disease, stage 5: Secondary | ICD-10-CM | POA: Diagnosis not present

## 2021-09-10 DIAGNOSIS — I132 Hypertensive heart and chronic kidney disease with heart failure and with stage 5 chronic kidney disease, or end stage renal disease: Secondary | ICD-10-CM | POA: Diagnosis not present

## 2021-09-10 DIAGNOSIS — N185 Chronic kidney disease, stage 5: Secondary | ICD-10-CM | POA: Diagnosis not present

## 2021-09-10 DIAGNOSIS — I5040 Unspecified combined systolic (congestive) and diastolic (congestive) heart failure: Secondary | ICD-10-CM | POA: Diagnosis not present

## 2021-09-10 DIAGNOSIS — I4891 Unspecified atrial fibrillation: Secondary | ICD-10-CM | POA: Diagnosis not present

## 2021-09-10 DIAGNOSIS — J449 Chronic obstructive pulmonary disease, unspecified: Secondary | ICD-10-CM | POA: Diagnosis not present

## 2021-09-10 DIAGNOSIS — I714 Abdominal aortic aneurysm, without rupture, unspecified: Secondary | ICD-10-CM | POA: Diagnosis not present

## 2021-09-11 ENCOUNTER — Encounter: Payer: Self-pay | Admitting: *Deleted

## 2021-09-12 DIAGNOSIS — I4891 Unspecified atrial fibrillation: Secondary | ICD-10-CM | POA: Diagnosis not present

## 2021-09-12 DIAGNOSIS — J449 Chronic obstructive pulmonary disease, unspecified: Secondary | ICD-10-CM | POA: Diagnosis not present

## 2021-09-12 DIAGNOSIS — N185 Chronic kidney disease, stage 5: Secondary | ICD-10-CM | POA: Diagnosis not present

## 2021-09-12 DIAGNOSIS — I5040 Unspecified combined systolic (congestive) and diastolic (congestive) heart failure: Secondary | ICD-10-CM | POA: Diagnosis not present

## 2021-09-12 DIAGNOSIS — I714 Abdominal aortic aneurysm, without rupture, unspecified: Secondary | ICD-10-CM | POA: Diagnosis not present

## 2021-09-12 DIAGNOSIS — I132 Hypertensive heart and chronic kidney disease with heart failure and with stage 5 chronic kidney disease, or end stage renal disease: Secondary | ICD-10-CM | POA: Diagnosis not present

## 2021-09-17 DIAGNOSIS — N185 Chronic kidney disease, stage 5: Secondary | ICD-10-CM | POA: Diagnosis not present

## 2021-09-17 DIAGNOSIS — J449 Chronic obstructive pulmonary disease, unspecified: Secondary | ICD-10-CM | POA: Diagnosis not present

## 2021-09-17 DIAGNOSIS — I714 Abdominal aortic aneurysm, without rupture, unspecified: Secondary | ICD-10-CM | POA: Diagnosis not present

## 2021-09-17 DIAGNOSIS — I5040 Unspecified combined systolic (congestive) and diastolic (congestive) heart failure: Secondary | ICD-10-CM | POA: Diagnosis not present

## 2021-09-17 DIAGNOSIS — I4891 Unspecified atrial fibrillation: Secondary | ICD-10-CM | POA: Diagnosis not present

## 2021-09-17 DIAGNOSIS — I132 Hypertensive heart and chronic kidney disease with heart failure and with stage 5 chronic kidney disease, or end stage renal disease: Secondary | ICD-10-CM | POA: Diagnosis not present

## 2021-09-21 ENCOUNTER — Telehealth: Payer: Self-pay | Admitting: Cardiology

## 2021-09-21 ENCOUNTER — Encounter (HOSPITAL_COMMUNITY): Payer: Medicare Other

## 2021-09-21 DIAGNOSIS — I714 Abdominal aortic aneurysm, without rupture, unspecified: Secondary | ICD-10-CM | POA: Diagnosis not present

## 2021-09-21 DIAGNOSIS — J449 Chronic obstructive pulmonary disease, unspecified: Secondary | ICD-10-CM | POA: Diagnosis not present

## 2021-09-21 DIAGNOSIS — I4891 Unspecified atrial fibrillation: Secondary | ICD-10-CM | POA: Diagnosis not present

## 2021-09-21 DIAGNOSIS — I5040 Unspecified combined systolic (congestive) and diastolic (congestive) heart failure: Secondary | ICD-10-CM | POA: Diagnosis not present

## 2021-09-21 DIAGNOSIS — N185 Chronic kidney disease, stage 5: Secondary | ICD-10-CM | POA: Diagnosis not present

## 2021-09-21 DIAGNOSIS — I132 Hypertensive heart and chronic kidney disease with heart failure and with stage 5 chronic kidney disease, or end stage renal disease: Secondary | ICD-10-CM | POA: Diagnosis not present

## 2021-09-21 NOTE — Telephone Encounter (Signed)
Patient received letter from [place] for lab work.  Ivin Booty, from [facility] wanted to make [place] aware that patient is on hospice care. She wants to know if you still wants lab work done.  ?

## 2021-09-21 NOTE — Telephone Encounter (Signed)
Hey just checking, would you still like blood work? ?Thanks! ? ?

## 2021-09-21 NOTE — Progress Notes (Incomplete)
? ?ADVANCED HF CLINIC CONSULT NOTE ? ? ?Primary Care: Wenda Low, MD ?HF Cardiologist: Dr. Haroldine Laws ? ?HPI: ?Anthony Foster is a 81 y.o. 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  ?  ?He follows with Dr. Stanford Breed in outpatient setting. EF 35-40% in 2014 and 2017.  ?  ?Admitted on 03/03 with acute on chronic systolic CHF. Also noted to be back in AF with controlled rate. Had stopped taking all of his medications in November 2022 d/t concern they were causing diarrhea and restarted them 2-3 weeks ago. Scr up to 3.90 (baseline 3.2). CT A/P w/o contrast with evidence of anasarca, b/l pleural effusions, ankylosed SI joints, b/l scrotal hydroceles. Started on IV lasix 120 mg BID, subsequently has been increased to 160 mg TID. Renal function has worsening with attempts to diurese. Scr has trended up to 4.78 today. Weight down 4 lb total. UOP suoptimal despite high doses of diuretics. Has required addition of midodrine for SBP support. Unable to add GDMT. Growing concern for possible low-output HF. Echo this admit with EF 20-25%, RV function low normal, RVSP 50 mmHg, mild MR, moderate TR. Going for RHC today to assess filling pressures and CO.  ? ?08/28/21- RHC with biventricular failure, marked volume overload, low CO by Fick (thermo ok), and mixed pulmonary HTN.  Started on milrinone 0.125 mcg.  ? ? ? ?Review of Systems: [y] = yes, '[ ]'$  = no  ? ?General: Weight gain '[ ]'$ ; Weight loss '[ ]'$ ; Anorexia '[ ]'$ ; Fatigue '[ ]'$ ; Fever '[ ]'$ ; Chills '[ ]'$ ; Weakness '[ ]'$   ?Cardiac: Chest pain/pressure '[ ]'$ ; Resting SOB '[ ]'$ ; Exertional SOB '[ ]'$ ; Orthopnea '[ ]'$ ; Pedal Edema '[ ]'$ ; Palpitations '[ ]'$ ; Syncope '[ ]'$ ; Presyncope '[ ]'$ ; Paroxysmal nocturnal dyspnea'[ ]'$   ?Pulmonary: Cough '[ ]'$ ; Wheezing'[ ]'$ ; Hemoptysis'[ ]'$ ; Sputum '[ ]'$ ; Snoring '[ ]'$   ?GI: Vomiting'[ ]'$ ; Dysphagia'[ ]'$ ; Melena'[ ]'$ ; Hematochezia '[ ]'$ ; Heartburn[  ]; Abdominal pain '[ ]'$ ; Constipation '[ ]'$ ; Diarrhea '[ ]'$ ; BRBPR '[ ]'$   ?GU: Hematuria'[ ]'$ ; Dysuria '[ ]'$ ; Nocturia'[ ]'$   ?Vascular: Pain in legs with walking '[ ]'$ ; Pain in feet with lying flat '[ ]'$ ; Non-healing sores '[ ]'$ ; Stroke '[ ]'$ ; TIA '[ ]'$ ; Slurred speech '[ ]'$ ;  ?Neuro: Headaches'[ ]'$ ; Vertigo'[ ]'$ ; Seizures'[ ]'$ ; Paresthesias'[ ]'$ ;Blurred vision '[ ]'$ ; Diplopia '[ ]'$ ; Vision changes '[ ]'$   ?Ortho/Skin: Arthritis '[ ]'$ ; Joint pain '[ ]'$ ; Muscle pain '[ ]'$ ; Joint swelling '[ ]'$ ; Back Pain '[ ]'$ ; Rash '[ ]'$   ?Psych: Depression'[ ]'$ ; Anxiety'[ ]'$   ?Heme: Bleeding problems '[ ]'$ ; Clotting disorders '[ ]'$ ; Anemia '[ ]'$   ?Endocrine: Diabetes '[ ]'$ ; Thyroid dysfunction'[ ]'$  ? ? ?Past Medical History:  ?Diagnosis Date  ? AAA (abdominal aortic aneurysm)   ? 3.5 cm 04/2018  ? Atrial fibrillation ([place])   ? CHF (congestive heart failure) ([place])   ? Coronary artery disease   ? Prior stent  ? Degenerative joint disease   ? Full dentures   ? Gout   ? Hypercholesteremia   ? Hypertension   ? ICD (implantable cardioverter-defibrillator) battery depletion   ? Ischemic cardiomyopathy   ? Pneumonia   ? Renal insufficiency   ? Wears glasses   ? ? ?Current Outpatient Medications  ?Medication Sig Dispense Refill  ? allopurinol (ZYLOPRIM) 300 MG tablet Take 300 mg by mouth daily.  6  ?  amiodarone (PACERONE) 200 MG tablet Take 1 tablet (200 mg total) by mouth 2 (two) times daily. 60 tablet 0  ? apixaban (ELIQUIS) 2.5 MG TABS tablet Take 1 tablet (2.5 mg total) by mouth daily. 180 tablet 3  ? atorvastatin (LIPITOR) 80 MG tablet Take 1 tablet (80 mg total) by mouth daily. 30 tablet 6  ? midodrine (PROAMATINE) 10 MG tablet Take 1 tablet (10 mg total) by mouth 2 (two) times daily with a meal. 60 tablet 0  ? nitroGLYCERIN (NITROSTAT) 0.4 MG SL tablet Place 1 tablet (0.4 mg total) under the tongue every 5 (five) minutes as needed for chest pain. 25 tablet 3  ? oxyCODONE-acetaminophen (PERCOCET/ROXICET) 5-325 MG tablet Take 1 tablet by mouth every 6 (six) hours as needed. 10 tablet 0  ? PROAIR  RESPICLICK 299 (90 Base) MCG/ACT AEPB Inhale 1 puff into the lungs every 4 (four) hours as needed (shortness of breath).    ? Torsemide 40 MG TABS Take 40 mg by mouth daily. 30 tablet 0  ? ?No current facility-administered medications for this visit.  ? ? ?Allergies  ?Allergen Reactions  ? Hymenoptera Venom Preparations Hives  ?  Reaction to wasps  ? Lisinopril Other (See Comments)  ?  angioedema  ? Piroxicam Other (See Comments)  ?  burn  ? Shellfish Allergy Swelling  ? Amoxicillin-Pot Clavulanate Rash  ?  Did it involve swelling of the face/tongue/throat, SOB, or low BP? No ?Did it involve sudden or severe rash/hives, skin peeling, or any reaction on the inside of your mouth or nose? Yes ?Did you need to seek medical attention at a hospital or doctor's office? Yes ?When did it last happen?      over 10 years ?If all above answers are ?NO?, may proceed with cephalosporin use. ?  ? ? ?  ?Social History  ? ?Socioeconomic History  ? Marital status: Divorced  ?  Spouse name: Not on file  ? Number of children: 4  ? Years of education: Not on file  ? Highest education level: Not on file  ?Occupational History  ? Not on file  ?Tobacco Use  ? Smoking status: Some Days  ?  Types: Cigars  ? Smokeless tobacco: Never  ?Vaping Use  ? Vaping Use: Never used  ?Substance and Sexual Activity  ? Alcohol use: Not Currently  ?  Alcohol/week: 0.0 standard drinks  ? Drug use: Never  ? Sexual activity: Not on file  ?Other Topics Concern  ? Not on file  ?Social History Narrative  ? Not on file  ? ?Social Determinants of Health  ? ?Financial Resource Strain: Not on file  ?Food Insecurity: Not on file  ?Transportation Needs: Not on file  ?Physical Activity: Not on file  ?Stress: Not on file  ?Social Connections: Not on file  ?Intimate Partner Violence: Not on file  ? ? ?  ?Family History  ?Problem Relation Age of Onset  ? Heart disease Other   ?     No family history  ? Diabetes Sister   ? ? ?There were no vitals filed for this  visit. ? ?PHYSICAL EXAM: ?General:  Well appearing. No respiratory difficulty ?HEENT: normal ?Neck: supple. no JVD. Carotids 2+ bilat; no bruits. No lymphadenopathy or thryomegaly appreciated. ?Cor: PMI nondisplaced. Regular rate & rhythm. No rubs, gallops or murmurs. ?Lungs: clear ?Abdomen: soft, nontender, nondistended. No hepatosplenomegaly. No bruits or masses. Good bowel sounds. ?Extremities: no cyanosis, clubbing, rash, edema ?Neuro: alert & oriented x 3, cranial  nerves grossly intact. moves all 4 extremities w/o difficulty. Affect pleasant. ? ?ECG: ? ? ?ASSESSMENT & PLAN: ?Acute on chronic systolic CHF ?-EF previously 35-40% on echo 2014 and 2017 ?-Hx ICD 2014 ?-Felt to be ICM. Had MI and stent in 2012 per chart review (no prior cath report available). Prior care in [place].  ?- Echo this admit with EF 20-25%, RV low normal, PASP 50 mmHg, mild MR, moderate TR ?- Admitted with a/c CHF in setting of noncompliance with medications. Had been off his medications for at least 3 months and restarted 2-3 weeks ago. Not sure how much recurrent AF contributing. Had < 1% burden on device check in January. In AF on admit. BNP > 3,900 on admit ?- RHC marked volume overload, low CO by thermo and mixed pulmonary HTN. Started on milrinone 0.125 mcg.  Milrinone stopped 3/13. CO-OX remains stable.  ?- CVP 5-6.  Continue to hold diuretics. Tomorrow would start torsemide 40 mg daily  ?- No BB with low-output HF ?-GDMT limited by hypotension and AKI on CKD ?-On midodrine 10 mg TID for BP support ?-TED hose ?  ?2. CAD ?- Hx MI and stent in 2012 per chart review ?- No chest pain.  ?- On atorvastatin 80 + zetia ?- No aspirin d/t anticoagulation ?  ?3. Atrial fibrillation. paroxysmal ?-1% burden on last device check in January ?-in AF this admit, rate controlled.   ?-Suspect this is related to decompensation . Continue amio 200 bid.  ?-TSH okay ?-On eliquis 2.5 mg BID. No bleeding ?  ?4. AKI on CKD IV ?- Scr baseline 3.2, up to  4.8 with attempts to diurese. Creatinine continues to trend up.   ?- Continues to make urine.  ?- Has AV fistula in place.  ?- Nephrology signed off. Patient does not want HD. ?- Continue midodrine for BP support.  ?

## 2021-09-22 DIAGNOSIS — M109 Gout, unspecified: Secondary | ICD-10-CM | POA: Diagnosis not present

## 2021-09-22 DIAGNOSIS — I2510 Atherosclerotic heart disease of native coronary artery without angina pectoris: Secondary | ICD-10-CM | POA: Diagnosis not present

## 2021-09-22 DIAGNOSIS — N185 Chronic kidney disease, stage 5: Secondary | ICD-10-CM | POA: Diagnosis not present

## 2021-09-22 DIAGNOSIS — E785 Hyperlipidemia, unspecified: Secondary | ICD-10-CM | POA: Diagnosis not present

## 2021-09-22 DIAGNOSIS — I132 Hypertensive heart and chronic kidney disease with heart failure and with stage 5 chronic kidney disease, or end stage renal disease: Secondary | ICD-10-CM | POA: Diagnosis not present

## 2021-09-22 DIAGNOSIS — I714 Abdominal aortic aneurysm, without rupture, unspecified: Secondary | ICD-10-CM | POA: Diagnosis not present

## 2021-09-22 DIAGNOSIS — I4891 Unspecified atrial fibrillation: Secondary | ICD-10-CM | POA: Diagnosis not present

## 2021-09-22 DIAGNOSIS — J449 Chronic obstructive pulmonary disease, unspecified: Secondary | ICD-10-CM | POA: Diagnosis not present

## 2021-09-22 DIAGNOSIS — I5040 Unspecified combined systolic (congestive) and diastolic (congestive) heart failure: Secondary | ICD-10-CM | POA: Diagnosis not present

## 2021-09-24 NOTE — Telephone Encounter (Signed)
Anthony Foster made aware. ?

## 2021-09-25 DIAGNOSIS — J449 Chronic obstructive pulmonary disease, unspecified: Secondary | ICD-10-CM | POA: Diagnosis not present

## 2021-09-25 DIAGNOSIS — I5040 Unspecified combined systolic (congestive) and diastolic (congestive) heart failure: Secondary | ICD-10-CM | POA: Diagnosis not present

## 2021-09-25 DIAGNOSIS — I714 Abdominal aortic aneurysm, without rupture, unspecified: Secondary | ICD-10-CM | POA: Diagnosis not present

## 2021-09-25 DIAGNOSIS — I4891 Unspecified atrial fibrillation: Secondary | ICD-10-CM | POA: Diagnosis not present

## 2021-09-25 DIAGNOSIS — N185 Chronic kidney disease, stage 5: Secondary | ICD-10-CM | POA: Diagnosis not present

## 2021-09-25 DIAGNOSIS — I132 Hypertensive heart and chronic kidney disease with heart failure and with stage 5 chronic kidney disease, or end stage renal disease: Secondary | ICD-10-CM | POA: Diagnosis not present

## 2021-09-28 ENCOUNTER — Telehealth: Payer: Self-pay

## 2021-09-28 DIAGNOSIS — I132 Hypertensive heart and chronic kidney disease with heart failure and with stage 5 chronic kidney disease, or end stage renal disease: Secondary | ICD-10-CM | POA: Diagnosis not present

## 2021-09-28 DIAGNOSIS — I5040 Unspecified combined systolic (congestive) and diastolic (congestive) heart failure: Secondary | ICD-10-CM | POA: Diagnosis not present

## 2021-09-28 DIAGNOSIS — I714 Abdominal aortic aneurysm, without rupture, unspecified: Secondary | ICD-10-CM | POA: Diagnosis not present

## 2021-09-28 DIAGNOSIS — J449 Chronic obstructive pulmonary disease, unspecified: Secondary | ICD-10-CM | POA: Diagnosis not present

## 2021-09-28 DIAGNOSIS — I4891 Unspecified atrial fibrillation: Secondary | ICD-10-CM | POA: Diagnosis not present

## 2021-09-28 DIAGNOSIS — N185 Chronic kidney disease, stage 5: Secondary | ICD-10-CM | POA: Diagnosis not present

## 2021-09-28 NOTE — Telephone Encounter (Signed)
[facility] hospice nurse called wanting to know about the patient remote transmission. I let her know that the monitor checks him at night between the hours of midnight and 5 am. I let her know when the patient starts to actively dying to call us. We will needs a written order from either the hospice nurse or Dr. Lovena Le before we can call industry to turn off tachy therapies. I gave her the device clinic direct number. ?

## 2021-10-04 DIAGNOSIS — I5040 Unspecified combined systolic (congestive) and diastolic (congestive) heart failure: Secondary | ICD-10-CM | POA: Diagnosis not present

## 2021-10-04 DIAGNOSIS — I714 Abdominal aortic aneurysm, without rupture, unspecified: Secondary | ICD-10-CM | POA: Diagnosis not present

## 2021-10-04 DIAGNOSIS — N185 Chronic kidney disease, stage 5: Secondary | ICD-10-CM | POA: Diagnosis not present

## 2021-10-04 DIAGNOSIS — I4891 Unspecified atrial fibrillation: Secondary | ICD-10-CM | POA: Diagnosis not present

## 2021-10-04 DIAGNOSIS — J449 Chronic obstructive pulmonary disease, unspecified: Secondary | ICD-10-CM | POA: Diagnosis not present

## 2021-10-04 DIAGNOSIS — I132 Hypertensive heart and chronic kidney disease with heart failure and with stage 5 chronic kidney disease, or end stage renal disease: Secondary | ICD-10-CM | POA: Diagnosis not present

## 2021-10-10 DIAGNOSIS — I5040 Unspecified combined systolic (congestive) and diastolic (congestive) heart failure: Secondary | ICD-10-CM | POA: Diagnosis not present

## 2021-10-10 DIAGNOSIS — I132 Hypertensive heart and chronic kidney disease with heart failure and with stage 5 chronic kidney disease, or end stage renal disease: Secondary | ICD-10-CM | POA: Diagnosis not present

## 2021-10-10 DIAGNOSIS — I4891 Unspecified atrial fibrillation: Secondary | ICD-10-CM | POA: Diagnosis not present

## 2021-10-10 DIAGNOSIS — I714 Abdominal aortic aneurysm, without rupture, unspecified: Secondary | ICD-10-CM | POA: Diagnosis not present

## 2021-10-10 DIAGNOSIS — J449 Chronic obstructive pulmonary disease, unspecified: Secondary | ICD-10-CM | POA: Diagnosis not present

## 2021-10-10 DIAGNOSIS — N185 Chronic kidney disease, stage 5: Secondary | ICD-10-CM | POA: Diagnosis not present

## 2021-10-11 DIAGNOSIS — I5040 Unspecified combined systolic (congestive) and diastolic (congestive) heart failure: Secondary | ICD-10-CM | POA: Diagnosis not present

## 2021-10-11 DIAGNOSIS — J449 Chronic obstructive pulmonary disease, unspecified: Secondary | ICD-10-CM | POA: Diagnosis not present

## 2021-10-11 DIAGNOSIS — I714 Abdominal aortic aneurysm, without rupture, unspecified: Secondary | ICD-10-CM | POA: Diagnosis not present

## 2021-10-11 DIAGNOSIS — I132 Hypertensive heart and chronic kidney disease with heart failure and with stage 5 chronic kidney disease, or end stage renal disease: Secondary | ICD-10-CM | POA: Diagnosis not present

## 2021-10-11 DIAGNOSIS — I4891 Unspecified atrial fibrillation: Secondary | ICD-10-CM | POA: Diagnosis not present

## 2021-10-11 DIAGNOSIS — N185 Chronic kidney disease, stage 5: Secondary | ICD-10-CM | POA: Diagnosis not present

## 2021-10-18 ENCOUNTER — Ambulatory Visit (INDEPENDENT_AMBULATORY_CARE_PROVIDER_SITE_OTHER): Payer: Medicare Other

## 2021-10-18 DIAGNOSIS — I5022 Chronic systolic (congestive) heart failure: Secondary | ICD-10-CM

## 2021-10-18 DIAGNOSIS — I255 Ischemic cardiomyopathy: Secondary | ICD-10-CM | POA: Diagnosis not present

## 2021-10-18 LAB — CUP PACEART REMOTE DEVICE CHECK
Battery Remaining Longevity: 7 mo
Battery Remaining Percentage: 9 %
Battery Voltage: 2.66 V
Brady Statistic AP VP Percent: 1.3 %
Brady Statistic AP VS Percent: 2.6 %
Brady Statistic AS VP Percent: 36 %
Brady Statistic AS VS Percent: 56 %
Brady Statistic RA Percent Paced: 2.1 %
Brady Statistic RV Percent Paced: 37 %
Date Time Interrogation Session: 20230427040018
HighPow Impedance: 44 Ohm
HighPow Impedance: 44 Ohm
Implantable Lead Implant Date: 20140206
Implantable Lead Implant Date: 20140206
Implantable Lead Location: 753859
Implantable Lead Location: 753860
Implantable Pulse Generator Implant Date: 20170206
Lead Channel Impedance Value: 360 Ohm
Lead Channel Impedance Value: 360 Ohm
Lead Channel Pacing Threshold Amplitude: 1.125 V
Lead Channel Pacing Threshold Amplitude: 3 V
Lead Channel Pacing Threshold Pulse Width: 0.5 ms
Lead Channel Pacing Threshold Pulse Width: 1 ms
Lead Channel Sensing Intrinsic Amplitude: 0.8 mV
Lead Channel Sensing Intrinsic Amplitude: 8.8 mV
Lead Channel Setting Pacing Amplitude: 1.375
Lead Channel Setting Pacing Amplitude: 5 V
Lead Channel Setting Pacing Pulse Width: 0.5 ms
Lead Channel Setting Sensing Sensitivity: 0.5 mV
Pulse Gen Serial Number: 7072725

## 2021-10-19 DIAGNOSIS — N185 Chronic kidney disease, stage 5: Secondary | ICD-10-CM | POA: Diagnosis not present

## 2021-10-19 DIAGNOSIS — I4891 Unspecified atrial fibrillation: Secondary | ICD-10-CM | POA: Diagnosis not present

## 2021-10-19 DIAGNOSIS — I5040 Unspecified combined systolic (congestive) and diastolic (congestive) heart failure: Secondary | ICD-10-CM | POA: Diagnosis not present

## 2021-10-19 DIAGNOSIS — I132 Hypertensive heart and chronic kidney disease with heart failure and with stage 5 chronic kidney disease, or end stage renal disease: Secondary | ICD-10-CM | POA: Diagnosis not present

## 2021-10-19 DIAGNOSIS — J449 Chronic obstructive pulmonary disease, unspecified: Secondary | ICD-10-CM | POA: Diagnosis not present

## 2021-10-19 DIAGNOSIS — I714 Abdominal aortic aneurysm, without rupture, unspecified: Secondary | ICD-10-CM | POA: Diagnosis not present

## 2021-10-22 DIAGNOSIS — I5040 Unspecified combined systolic (congestive) and diastolic (congestive) heart failure: Secondary | ICD-10-CM | POA: Diagnosis not present

## 2021-10-22 DIAGNOSIS — N185 Chronic kidney disease, stage 5: Secondary | ICD-10-CM | POA: Diagnosis not present

## 2021-10-22 DIAGNOSIS — I714 Abdominal aortic aneurysm, without rupture, unspecified: Secondary | ICD-10-CM | POA: Diagnosis not present

## 2021-10-22 DIAGNOSIS — I2510 Atherosclerotic heart disease of native coronary artery without angina pectoris: Secondary | ICD-10-CM | POA: Diagnosis not present

## 2021-10-22 DIAGNOSIS — I132 Hypertensive heart and chronic kidney disease with heart failure and with stage 5 chronic kidney disease, or end stage renal disease: Secondary | ICD-10-CM | POA: Diagnosis not present

## 2021-10-22 DIAGNOSIS — E785 Hyperlipidemia, unspecified: Secondary | ICD-10-CM | POA: Diagnosis not present

## 2021-10-22 DIAGNOSIS — M109 Gout, unspecified: Secondary | ICD-10-CM | POA: Diagnosis not present

## 2021-10-22 DIAGNOSIS — J449 Chronic obstructive pulmonary disease, unspecified: Secondary | ICD-10-CM | POA: Diagnosis not present

## 2021-10-22 DIAGNOSIS — I4891 Unspecified atrial fibrillation: Secondary | ICD-10-CM | POA: Diagnosis not present

## 2021-10-25 DIAGNOSIS — I714 Abdominal aortic aneurysm, without rupture, unspecified: Secondary | ICD-10-CM | POA: Diagnosis not present

## 2021-10-25 DIAGNOSIS — I132 Hypertensive heart and chronic kidney disease with heart failure and with stage 5 chronic kidney disease, or end stage renal disease: Secondary | ICD-10-CM | POA: Diagnosis not present

## 2021-10-25 DIAGNOSIS — I4891 Unspecified atrial fibrillation: Secondary | ICD-10-CM | POA: Diagnosis not present

## 2021-10-25 DIAGNOSIS — N185 Chronic kidney disease, stage 5: Secondary | ICD-10-CM | POA: Diagnosis not present

## 2021-10-25 DIAGNOSIS — I5040 Unspecified combined systolic (congestive) and diastolic (congestive) heart failure: Secondary | ICD-10-CM | POA: Diagnosis not present

## 2021-10-25 DIAGNOSIS — J449 Chronic obstructive pulmonary disease, unspecified: Secondary | ICD-10-CM | POA: Diagnosis not present

## 2021-11-01 DIAGNOSIS — I132 Hypertensive heart and chronic kidney disease with heart failure and with stage 5 chronic kidney disease, or end stage renal disease: Secondary | ICD-10-CM | POA: Diagnosis not present

## 2021-11-01 DIAGNOSIS — I5040 Unspecified combined systolic (congestive) and diastolic (congestive) heart failure: Secondary | ICD-10-CM | POA: Diagnosis not present

## 2021-11-01 DIAGNOSIS — I4891 Unspecified atrial fibrillation: Secondary | ICD-10-CM | POA: Diagnosis not present

## 2021-11-01 DIAGNOSIS — I714 Abdominal aortic aneurysm, without rupture, unspecified: Secondary | ICD-10-CM | POA: Diagnosis not present

## 2021-11-01 DIAGNOSIS — N185 Chronic kidney disease, stage 5: Secondary | ICD-10-CM | POA: Diagnosis not present

## 2021-11-01 DIAGNOSIS — J449 Chronic obstructive pulmonary disease, unspecified: Secondary | ICD-10-CM | POA: Diagnosis not present

## 2021-11-01 NOTE — Progress Notes (Signed)
Remote ICD transmission.   

## 2021-11-03 DIAGNOSIS — N185 Chronic kidney disease, stage 5: Secondary | ICD-10-CM | POA: Diagnosis not present

## 2021-11-03 DIAGNOSIS — I132 Hypertensive heart and chronic kidney disease with heart failure and with stage 5 chronic kidney disease, or end stage renal disease: Secondary | ICD-10-CM | POA: Diagnosis not present

## 2021-11-03 DIAGNOSIS — I5040 Unspecified combined systolic (congestive) and diastolic (congestive) heart failure: Secondary | ICD-10-CM | POA: Diagnosis not present

## 2021-11-03 DIAGNOSIS — J449 Chronic obstructive pulmonary disease, unspecified: Secondary | ICD-10-CM | POA: Diagnosis not present

## 2021-11-03 DIAGNOSIS — I714 Abdominal aortic aneurysm, without rupture, unspecified: Secondary | ICD-10-CM | POA: Diagnosis not present

## 2021-11-03 DIAGNOSIS — I4891 Unspecified atrial fibrillation: Secondary | ICD-10-CM | POA: Diagnosis not present

## 2021-11-08 DIAGNOSIS — I714 Abdominal aortic aneurysm, without rupture, unspecified: Secondary | ICD-10-CM | POA: Diagnosis not present

## 2021-11-08 DIAGNOSIS — J449 Chronic obstructive pulmonary disease, unspecified: Secondary | ICD-10-CM | POA: Diagnosis not present

## 2021-11-08 DIAGNOSIS — I4891 Unspecified atrial fibrillation: Secondary | ICD-10-CM | POA: Diagnosis not present

## 2021-11-08 DIAGNOSIS — I5040 Unspecified combined systolic (congestive) and diastolic (congestive) heart failure: Secondary | ICD-10-CM | POA: Diagnosis not present

## 2021-11-08 DIAGNOSIS — N185 Chronic kidney disease, stage 5: Secondary | ICD-10-CM | POA: Diagnosis not present

## 2021-11-08 DIAGNOSIS — I132 Hypertensive heart and chronic kidney disease with heart failure and with stage 5 chronic kidney disease, or end stage renal disease: Secondary | ICD-10-CM | POA: Diagnosis not present

## 2021-11-12 DIAGNOSIS — J449 Chronic obstructive pulmonary disease, unspecified: Secondary | ICD-10-CM | POA: Diagnosis not present

## 2021-11-12 DIAGNOSIS — N185 Chronic kidney disease, stage 5: Secondary | ICD-10-CM | POA: Diagnosis not present

## 2021-11-12 DIAGNOSIS — I5040 Unspecified combined systolic (congestive) and diastolic (congestive) heart failure: Secondary | ICD-10-CM | POA: Diagnosis not present

## 2021-11-12 DIAGNOSIS — I714 Abdominal aortic aneurysm, without rupture, unspecified: Secondary | ICD-10-CM | POA: Diagnosis not present

## 2021-11-12 DIAGNOSIS — I132 Hypertensive heart and chronic kidney disease with heart failure and with stage 5 chronic kidney disease, or end stage renal disease: Secondary | ICD-10-CM | POA: Diagnosis not present

## 2021-11-12 DIAGNOSIS — I4891 Unspecified atrial fibrillation: Secondary | ICD-10-CM | POA: Diagnosis not present

## 2021-11-16 DIAGNOSIS — I714 Abdominal aortic aneurysm, without rupture, unspecified: Secondary | ICD-10-CM | POA: Diagnosis not present

## 2021-11-16 DIAGNOSIS — I5040 Unspecified combined systolic (congestive) and diastolic (congestive) heart failure: Secondary | ICD-10-CM | POA: Diagnosis not present

## 2021-11-16 DIAGNOSIS — J449 Chronic obstructive pulmonary disease, unspecified: Secondary | ICD-10-CM | POA: Diagnosis not present

## 2021-11-16 DIAGNOSIS — I132 Hypertensive heart and chronic kidney disease with heart failure and with stage 5 chronic kidney disease, or end stage renal disease: Secondary | ICD-10-CM | POA: Diagnosis not present

## 2021-11-16 DIAGNOSIS — I4891 Unspecified atrial fibrillation: Secondary | ICD-10-CM | POA: Diagnosis not present

## 2021-11-16 DIAGNOSIS — N185 Chronic kidney disease, stage 5: Secondary | ICD-10-CM | POA: Diagnosis not present

## 2021-11-22 DIAGNOSIS — N185 Chronic kidney disease, stage 5: Secondary | ICD-10-CM | POA: Diagnosis not present

## 2021-11-22 DIAGNOSIS — M109 Gout, unspecified: Secondary | ICD-10-CM | POA: Diagnosis not present

## 2021-11-22 DIAGNOSIS — E785 Hyperlipidemia, unspecified: Secondary | ICD-10-CM | POA: Diagnosis not present

## 2021-11-22 DIAGNOSIS — I5040 Unspecified combined systolic (congestive) and diastolic (congestive) heart failure: Secondary | ICD-10-CM | POA: Diagnosis not present

## 2021-11-22 DIAGNOSIS — J449 Chronic obstructive pulmonary disease, unspecified: Secondary | ICD-10-CM | POA: Diagnosis not present

## 2021-11-22 DIAGNOSIS — I132 Hypertensive heart and chronic kidney disease with heart failure and with stage 5 chronic kidney disease, or end stage renal disease: Secondary | ICD-10-CM | POA: Diagnosis not present

## 2021-11-22 DIAGNOSIS — I2510 Atherosclerotic heart disease of native coronary artery without angina pectoris: Secondary | ICD-10-CM | POA: Diagnosis not present

## 2021-11-22 DIAGNOSIS — I714 Abdominal aortic aneurysm, without rupture, unspecified: Secondary | ICD-10-CM | POA: Diagnosis not present

## 2021-11-22 DIAGNOSIS — I4891 Unspecified atrial fibrillation: Secondary | ICD-10-CM | POA: Diagnosis not present

## 2021-12-05 DIAGNOSIS — I5040 Unspecified combined systolic (congestive) and diastolic (congestive) heart failure: Secondary | ICD-10-CM | POA: Diagnosis not present

## 2021-12-05 DIAGNOSIS — J449 Chronic obstructive pulmonary disease, unspecified: Secondary | ICD-10-CM | POA: Diagnosis not present

## 2021-12-05 DIAGNOSIS — N185 Chronic kidney disease, stage 5: Secondary | ICD-10-CM | POA: Diagnosis not present

## 2021-12-05 DIAGNOSIS — I132 Hypertensive heart and chronic kidney disease with heart failure and with stage 5 chronic kidney disease, or end stage renal disease: Secondary | ICD-10-CM | POA: Diagnosis not present

## 2021-12-05 DIAGNOSIS — I714 Abdominal aortic aneurysm, without rupture, unspecified: Secondary | ICD-10-CM | POA: Diagnosis not present

## 2021-12-05 DIAGNOSIS — I4891 Unspecified atrial fibrillation: Secondary | ICD-10-CM | POA: Diagnosis not present

## 2021-12-06 DIAGNOSIS — N185 Chronic kidney disease, stage 5: Secondary | ICD-10-CM | POA: Diagnosis not present

## 2021-12-06 DIAGNOSIS — I5040 Unspecified combined systolic (congestive) and diastolic (congestive) heart failure: Secondary | ICD-10-CM | POA: Diagnosis not present

## 2021-12-06 DIAGNOSIS — J449 Chronic obstructive pulmonary disease, unspecified: Secondary | ICD-10-CM | POA: Diagnosis not present

## 2021-12-06 DIAGNOSIS — I714 Abdominal aortic aneurysm, without rupture, unspecified: Secondary | ICD-10-CM | POA: Diagnosis not present

## 2021-12-06 DIAGNOSIS — I132 Hypertensive heart and chronic kidney disease with heart failure and with stage 5 chronic kidney disease, or end stage renal disease: Secondary | ICD-10-CM | POA: Diagnosis not present

## 2021-12-06 DIAGNOSIS — I4891 Unspecified atrial fibrillation: Secondary | ICD-10-CM | POA: Diagnosis not present

## 2021-12-13 DIAGNOSIS — I132 Hypertensive heart and chronic kidney disease with heart failure and with stage 5 chronic kidney disease, or end stage renal disease: Secondary | ICD-10-CM | POA: Diagnosis not present

## 2021-12-13 DIAGNOSIS — I5040 Unspecified combined systolic (congestive) and diastolic (congestive) heart failure: Secondary | ICD-10-CM | POA: Diagnosis not present

## 2021-12-13 DIAGNOSIS — I4891 Unspecified atrial fibrillation: Secondary | ICD-10-CM | POA: Diagnosis not present

## 2021-12-13 DIAGNOSIS — I714 Abdominal aortic aneurysm, without rupture, unspecified: Secondary | ICD-10-CM | POA: Diagnosis not present

## 2021-12-13 DIAGNOSIS — J449 Chronic obstructive pulmonary disease, unspecified: Secondary | ICD-10-CM | POA: Diagnosis not present

## 2021-12-13 DIAGNOSIS — N185 Chronic kidney disease, stage 5: Secondary | ICD-10-CM | POA: Diagnosis not present

## 2021-12-20 DIAGNOSIS — J449 Chronic obstructive pulmonary disease, unspecified: Secondary | ICD-10-CM | POA: Diagnosis not present

## 2021-12-20 DIAGNOSIS — I714 Abdominal aortic aneurysm, without rupture, unspecified: Secondary | ICD-10-CM | POA: Diagnosis not present

## 2021-12-20 DIAGNOSIS — N185 Chronic kidney disease, stage 5: Secondary | ICD-10-CM | POA: Diagnosis not present

## 2021-12-20 DIAGNOSIS — I5040 Unspecified combined systolic (congestive) and diastolic (congestive) heart failure: Secondary | ICD-10-CM | POA: Diagnosis not present

## 2021-12-20 DIAGNOSIS — I4891 Unspecified atrial fibrillation: Secondary | ICD-10-CM | POA: Diagnosis not present

## 2021-12-20 DIAGNOSIS — I132 Hypertensive heart and chronic kidney disease with heart failure and with stage 5 chronic kidney disease, or end stage renal disease: Secondary | ICD-10-CM | POA: Diagnosis not present

## 2021-12-22 DIAGNOSIS — Z955 Presence of coronary angioplasty implant and graft: Secondary | ICD-10-CM | POA: Diagnosis not present

## 2021-12-22 DIAGNOSIS — I5040 Unspecified combined systolic (congestive) and diastolic (congestive) heart failure: Secondary | ICD-10-CM | POA: Diagnosis not present

## 2021-12-22 DIAGNOSIS — Z6821 Body mass index (BMI) 21.0-21.9, adult: Secondary | ICD-10-CM | POA: Diagnosis not present

## 2021-12-22 DIAGNOSIS — I2510 Atherosclerotic heart disease of native coronary artery without angina pectoris: Secondary | ICD-10-CM | POA: Diagnosis not present

## 2021-12-22 DIAGNOSIS — R112 Nausea with vomiting, unspecified: Secondary | ICD-10-CM | POA: Diagnosis not present

## 2021-12-22 DIAGNOSIS — I132 Hypertensive heart and chronic kidney disease with heart failure and with stage 5 chronic kidney disease, or end stage renal disease: Secondary | ICD-10-CM | POA: Diagnosis not present

## 2021-12-22 DIAGNOSIS — I4891 Unspecified atrial fibrillation: Secondary | ICD-10-CM | POA: Diagnosis not present

## 2021-12-22 DIAGNOSIS — J449 Chronic obstructive pulmonary disease, unspecified: Secondary | ICD-10-CM | POA: Diagnosis not present

## 2021-12-22 DIAGNOSIS — N185 Chronic kidney disease, stage 5: Secondary | ICD-10-CM | POA: Diagnosis not present

## 2021-12-22 DIAGNOSIS — R251 Tremor, unspecified: Secondary | ICD-10-CM | POA: Diagnosis not present

## 2021-12-22 DIAGNOSIS — M109 Gout, unspecified: Secondary | ICD-10-CM | POA: Diagnosis not present

## 2021-12-22 DIAGNOSIS — K5900 Constipation, unspecified: Secondary | ICD-10-CM | POA: Diagnosis not present

## 2021-12-22 DIAGNOSIS — E785 Hyperlipidemia, unspecified: Secondary | ICD-10-CM | POA: Diagnosis not present

## 2021-12-22 DIAGNOSIS — I714 Abdominal aortic aneurysm, without rupture, unspecified: Secondary | ICD-10-CM | POA: Diagnosis not present

## 2021-12-22 DIAGNOSIS — I959 Hypotension, unspecified: Secondary | ICD-10-CM | POA: Diagnosis not present

## 2021-12-22 DIAGNOSIS — R627 Adult failure to thrive: Secondary | ICD-10-CM | POA: Diagnosis not present

## 2021-12-22 DIAGNOSIS — R634 Abnormal weight loss: Secondary | ICD-10-CM | POA: Diagnosis not present

## 2021-12-24 DIAGNOSIS — I714 Abdominal aortic aneurysm, without rupture, unspecified: Secondary | ICD-10-CM | POA: Diagnosis not present

## 2021-12-24 DIAGNOSIS — N185 Chronic kidney disease, stage 5: Secondary | ICD-10-CM | POA: Diagnosis not present

## 2021-12-24 DIAGNOSIS — J449 Chronic obstructive pulmonary disease, unspecified: Secondary | ICD-10-CM | POA: Diagnosis not present

## 2021-12-24 DIAGNOSIS — I132 Hypertensive heart and chronic kidney disease with heart failure and with stage 5 chronic kidney disease, or end stage renal disease: Secondary | ICD-10-CM | POA: Diagnosis not present

## 2021-12-24 DIAGNOSIS — I4891 Unspecified atrial fibrillation: Secondary | ICD-10-CM | POA: Diagnosis not present

## 2021-12-24 DIAGNOSIS — I5040 Unspecified combined systolic (congestive) and diastolic (congestive) heart failure: Secondary | ICD-10-CM | POA: Diagnosis not present

## 2021-12-27 DIAGNOSIS — I4891 Unspecified atrial fibrillation: Secondary | ICD-10-CM | POA: Diagnosis not present

## 2021-12-27 DIAGNOSIS — J449 Chronic obstructive pulmonary disease, unspecified: Secondary | ICD-10-CM | POA: Diagnosis not present

## 2021-12-27 DIAGNOSIS — I5040 Unspecified combined systolic (congestive) and diastolic (congestive) heart failure: Secondary | ICD-10-CM | POA: Diagnosis not present

## 2021-12-27 DIAGNOSIS — I714 Abdominal aortic aneurysm, without rupture, unspecified: Secondary | ICD-10-CM | POA: Diagnosis not present

## 2021-12-27 DIAGNOSIS — I132 Hypertensive heart and chronic kidney disease with heart failure and with stage 5 chronic kidney disease, or end stage renal disease: Secondary | ICD-10-CM | POA: Diagnosis not present

## 2021-12-27 DIAGNOSIS — N185 Chronic kidney disease, stage 5: Secondary | ICD-10-CM | POA: Diagnosis not present

## 2021-12-28 DIAGNOSIS — N185 Chronic kidney disease, stage 5: Secondary | ICD-10-CM | POA: Diagnosis not present

## 2021-12-28 DIAGNOSIS — J449 Chronic obstructive pulmonary disease, unspecified: Secondary | ICD-10-CM | POA: Diagnosis not present

## 2021-12-28 DIAGNOSIS — I5040 Unspecified combined systolic (congestive) and diastolic (congestive) heart failure: Secondary | ICD-10-CM | POA: Diagnosis not present

## 2021-12-28 DIAGNOSIS — I4891 Unspecified atrial fibrillation: Secondary | ICD-10-CM | POA: Diagnosis not present

## 2021-12-28 DIAGNOSIS — I132 Hypertensive heart and chronic kidney disease with heart failure and with stage 5 chronic kidney disease, or end stage renal disease: Secondary | ICD-10-CM | POA: Diagnosis not present

## 2021-12-28 DIAGNOSIS — I714 Abdominal aortic aneurysm, without rupture, unspecified: Secondary | ICD-10-CM | POA: Diagnosis not present

## 2021-12-30 ENCOUNTER — Encounter (HOSPITAL_COMMUNITY): Payer: Self-pay | Admitting: *Deleted

## 2021-12-30 ENCOUNTER — Emergency Department (HOSPITAL_COMMUNITY): Payer: Medicare Other

## 2021-12-30 ENCOUNTER — Emergency Department (HOSPITAL_COMMUNITY)
Admission: EM | Admit: 2021-12-30 | Discharge: 2021-12-30 | Disposition: A | Discharge status: Home / Self Care | Payer: Medicare Other | Attending: Emergency Medicine | Admitting: Emergency Medicine

## 2021-12-30 DIAGNOSIS — Z043 Encounter for examination and observation following other accident: Secondary | ICD-10-CM | POA: Diagnosis not present

## 2021-12-30 DIAGNOSIS — D72829 Elevated white blood cell count, unspecified: Secondary | ICD-10-CM | POA: Insufficient documentation

## 2021-12-30 DIAGNOSIS — M4692 Unspecified inflammatory spondylopathy, cervical region: Secondary | ICD-10-CM | POA: Diagnosis not present

## 2021-12-30 DIAGNOSIS — N186 End stage renal disease: Secondary | ICD-10-CM | POA: Diagnosis not present

## 2021-12-30 DIAGNOSIS — I7143 Infrarenal abdominal aortic aneurysm, without rupture: Secondary | ICD-10-CM | POA: Insufficient documentation

## 2021-12-30 DIAGNOSIS — S0003XA Contusion of scalp, initial encounter: Secondary | ICD-10-CM | POA: Diagnosis not present

## 2021-12-30 DIAGNOSIS — M47812 Spondylosis without myelopathy or radiculopathy, cervical region: Secondary | ICD-10-CM | POA: Diagnosis not present

## 2021-12-30 DIAGNOSIS — K4090 Unilateral inguinal hernia, without obstruction or gangrene, not specified as recurrent: Secondary | ICD-10-CM | POA: Diagnosis not present

## 2021-12-30 DIAGNOSIS — U071 COVID-19: Secondary | ICD-10-CM | POA: Diagnosis not present

## 2021-12-30 DIAGNOSIS — Z79899 Other long term (current) drug therapy: Secondary | ICD-10-CM | POA: Insufficient documentation

## 2021-12-30 DIAGNOSIS — S066X0A Traumatic subarachnoid hemorrhage without loss of consciousness, initial encounter: Secondary | ICD-10-CM | POA: Insufficient documentation

## 2021-12-30 DIAGNOSIS — I4891 Unspecified atrial fibrillation: Secondary | ICD-10-CM | POA: Diagnosis not present

## 2021-12-30 DIAGNOSIS — I609 Nontraumatic subarachnoid hemorrhage, unspecified: Secondary | ICD-10-CM

## 2021-12-30 DIAGNOSIS — I132 Hypertensive heart and chronic kidney disease with heart failure and with stage 5 chronic kidney disease, or end stage renal disease: Secondary | ICD-10-CM | POA: Insufficient documentation

## 2021-12-30 DIAGNOSIS — M109 Gout, unspecified: Secondary | ICD-10-CM | POA: Diagnosis not present

## 2021-12-30 DIAGNOSIS — W19XXXA Unspecified fall, initial encounter: Secondary | ICD-10-CM | POA: Insufficient documentation

## 2021-12-30 DIAGNOSIS — Z515 Encounter for palliative care: Secondary | ICD-10-CM | POA: Insufficient documentation

## 2021-12-30 DIAGNOSIS — I959 Hypotension, unspecified: Secondary | ICD-10-CM | POA: Diagnosis not present

## 2021-12-30 DIAGNOSIS — I2510 Atherosclerotic heart disease of native coronary artery without angina pectoris: Secondary | ICD-10-CM | POA: Insufficient documentation

## 2021-12-30 DIAGNOSIS — Z7901 Long term (current) use of anticoagulants: Secondary | ICD-10-CM | POA: Insufficient documentation

## 2021-12-30 DIAGNOSIS — I700 Atherosclerosis of aorta: Secondary | ICD-10-CM | POA: Diagnosis not present

## 2021-12-30 DIAGNOSIS — S299XXA Unspecified injury of thorax, initial encounter: Secondary | ICD-10-CM | POA: Diagnosis not present

## 2021-12-30 DIAGNOSIS — I509 Heart failure, unspecified: Secondary | ICD-10-CM | POA: Insufficient documentation

## 2021-12-30 DIAGNOSIS — I714 Abdominal aortic aneurysm, without rupture, unspecified: Secondary | ICD-10-CM | POA: Diagnosis not present

## 2021-12-30 DIAGNOSIS — R112 Nausea with vomiting, unspecified: Secondary | ICD-10-CM | POA: Diagnosis not present

## 2021-12-30 DIAGNOSIS — S0990XA Unspecified injury of head, initial encounter: Secondary | ICD-10-CM | POA: Diagnosis not present

## 2021-12-30 DIAGNOSIS — R110 Nausea: Secondary | ICD-10-CM | POA: Diagnosis not present

## 2021-12-30 DIAGNOSIS — S066X9A Traumatic subarachnoid hemorrhage with loss of consciousness of unspecified duration, initial encounter: Secondary | ICD-10-CM | POA: Diagnosis not present

## 2021-12-30 DIAGNOSIS — S199XXA Unspecified injury of neck, initial encounter: Secondary | ICD-10-CM | POA: Diagnosis not present

## 2021-12-30 DIAGNOSIS — Z95810 Presence of automatic (implantable) cardiac defibrillator: Secondary | ICD-10-CM | POA: Insufficient documentation

## 2021-12-30 DIAGNOSIS — S3991XA Unspecified injury of abdomen, initial encounter: Secondary | ICD-10-CM | POA: Diagnosis not present

## 2021-12-30 DIAGNOSIS — R1111 Vomiting without nausea: Secondary | ICD-10-CM | POA: Diagnosis not present

## 2021-12-30 DIAGNOSIS — J432 Centrilobular emphysema: Secondary | ICD-10-CM | POA: Diagnosis not present

## 2021-12-30 DIAGNOSIS — K449 Diaphragmatic hernia without obstruction or gangrene: Secondary | ICD-10-CM | POA: Diagnosis not present

## 2021-12-30 DIAGNOSIS — N19 Unspecified kidney failure: Secondary | ICD-10-CM

## 2021-12-30 LAB — URINALYSIS, ROUTINE W REFLEX MICROSCOPIC
Bilirubin Urine: NEGATIVE
Glucose, UA: NEGATIVE mg/dL
Hgb urine dipstick: NEGATIVE
Ketones, ur: NEGATIVE mg/dL
Leukocytes,Ua: NEGATIVE
Nitrite: NEGATIVE
Protein, ur: NEGATIVE mg/dL
Specific Gravity, Urine: 1.017 (ref 1.005–1.030)
pH: 5 (ref 5.0–8.0)

## 2021-12-30 LAB — COMPREHENSIVE METABOLIC PANEL
ALT: 71 U/L — ABNORMAL HIGH (ref 0–44)
AST: 44 U/L — ABNORMAL HIGH (ref 15–41)
Albumin: 3.8 g/dL (ref 3.5–5.0)
Alkaline Phosphatase: 61 U/L (ref 38–126)
Anion gap: 22 — ABNORMAL HIGH (ref 5–15)
BUN: 170 mg/dL — ABNORMAL HIGH (ref 8–23)
CO2: 20 mmol/L — ABNORMAL LOW (ref 22–32)
Calcium: 11.1 mg/dL — ABNORMAL HIGH (ref 8.9–10.3)
Chloride: 93 mmol/L — ABNORMAL LOW (ref 98–111)
Creatinine, Ser: 11.73 mg/dL — ABNORMAL HIGH (ref 0.61–1.24)
GFR, Estimated: 4 mL/min — ABNORMAL LOW (ref >60)
Glucose, Bld: 147 mg/dL — ABNORMAL HIGH (ref 70–99)
Potassium: 4.4 mmol/L (ref 3.5–5.1)
Sodium: 135 mmol/L (ref 135–145)
Total Bilirubin: 1.4 mg/dL — ABNORMAL HIGH (ref 0.3–1.2)
Total Protein: 7.5 g/dL (ref 6.5–8.1)

## 2021-12-30 LAB — I-STAT CHEM 8, ED
BUN: >130 mg/dL — ABNORMAL HIGH (ref 8–23)
Calcium, Ion: 1.25 mmol/L (ref 1.15–1.40)
Chloride: 100 mmol/L (ref 98–111)
Creatinine, Ser: 13 mg/dL — ABNORMAL HIGH (ref 0.61–1.24)
Glucose, Bld: 140 mg/dL — ABNORMAL HIGH (ref 70–99)
HCT: 46 % (ref 39.0–52.0)
Hemoglobin: 15.6 g/dL (ref 13.0–17.0)
Potassium: 4.4 mmol/L (ref 3.5–5.1)
Sodium: 135 mmol/L (ref 135–145)
TCO2: 23 mmol/L (ref 22–32)

## 2021-12-30 LAB — PROTIME-INR
INR: 1.5 — ABNORMAL HIGH (ref 0.8–1.2)
Prothrombin Time: 17.8 seconds — ABNORMAL HIGH (ref 11.4–15.2)

## 2021-12-30 LAB — CBC
HCT: 40.8 % (ref 39.0–52.0)
Hemoglobin: 13.6 g/dL (ref 13.0–17.0)
MCH: 27.1 pg (ref 26.0–34.0)
MCHC: 33.3 g/dL (ref 30.0–36.0)
MCV: 81.4 fL (ref 80.0–100.0)
Platelets: 169 10*3/uL (ref 150–400)
RBC: 5.01 MIL/uL (ref 4.22–5.81)
RDW: 19.8 % — ABNORMAL HIGH (ref 11.5–15.5)
WBC: 10.6 10*3/uL — ABNORMAL HIGH (ref 4.0–10.5)
nRBC: 0 % (ref 0.0–0.2)

## 2021-12-30 LAB — ETHANOL: Alcohol, Ethyl (B): <10 mg/dL (ref <10)

## 2021-12-30 LAB — RESP PANEL BY RT-PCR (FLU A&B, COVID) ARPGX2
Influenza A by PCR: NEGATIVE
Influenza B by PCR: NEGATIVE
SARS Coronavirus 2 by RT PCR: POSITIVE — AB

## 2021-12-30 LAB — LACTIC ACID, PLASMA: Lactic Acid, Venous: 2.8 mmol/L (ref 0.5–1.9)

## 2021-12-30 MED ORDER — IOHEXOL 350 MG/ML SOLN
75.0000 mL | Freq: Once | INTRAVENOUS | Status: AC | PRN
  Administered 2021-12-30: 75 mL via INTRAVENOUS

## 2021-12-30 NOTE — Progress Notes (Signed)
Manufacturing engineer [facility])  Mr. Chizek is a current hospice patient with a terminal diagnosis of heart failure.  He remains a full code.  His grandson, [name], is his NOK.  [facility] will continue to follow while hospitalized.  Thank you, Venia Carbon DNP, RN [place]  (Listed in [place] under hospice, or [phone])

## 2021-12-30 NOTE — ED Notes (Signed)
PT given ice pack for hematoma to R forehead

## 2021-12-30 NOTE — ED Provider Notes (Signed)
8:57 PM I discussed repeat CT results with grandson, who remains agreeable to patient returning home for ongoing palliative measures.   Carmin Muskrat, MD 12/30/21 2057

## 2021-12-30 NOTE — ED Notes (Signed)
Patient is resting comfortably. 

## 2021-12-30 NOTE — Progress Notes (Signed)
Orthopedic Tech Progress Note Patient Details:  Anthony Foster [facility] 06/23/1941 [id number]  Level 2 trauma. Ortho tech services are not needed at this moment.  Patient ID: Anthony Foster, male   DOB: [birth year], 81 y.o.   MRN: [id number]  Anthony Foster 12/30/2021, 11:25 AM

## 2021-12-30 NOTE — ED Provider Notes (Signed)
[facility] EMERGENCY DEPARTMENT Provider Note   CSN: [id number] Arrival date & time: 12/30/21  1114     History  Chief Complaint  Patient presents with  . Fall    Anthony Foster is a 81 y.o. male.   Fall Associated symptoms include headaches.    81 year old male presenting to the emergency department as a level 2 trauma due to a fall on Eliquis.  The patient has a history of atrial fibrillation, hypertension, hyperlipidemia, CAD, CHF, gout, ischemic cardiomyopathy with an ICD in place ([place]), AAA.  The patient is in [place] hospice patient as of 09/08/2021.  Per EMS, he is not quite at his baseline mental status.  He complains of headache in the vicinity of a scalp hematoma on the right but otherwise has no other complaints or signs of injury.  He arrived GCS 14, ABC intact.  Home Medications Prior to Admission medications   Medication Sig Start Date End Date Taking? Authorizing Provider  allopurinol (ZYLOPRIM) 300 MG tablet Take 300 mg by mouth daily. 07/26/16   [provider]  amiodarone (PACERONE) 200 MG tablet Take 1 tablet (200 mg total) by mouth 2 (two) times daily. 09/07/21   Domenic Polite, MD  apixaban (ELIQUIS) 2.5 MG TABS tablet Take 1 tablet (2.5 mg total) by mouth daily. 06/10/19   Lelon Perla, MD  atorvastatin (LIPITOR) 80 MG tablet Take 1 tablet (80 mg total) by mouth daily. 09/12/15   Lelon Perla, MD  midodrine (PROAMATINE) 10 MG tablet Take 1 tablet (10 mg total) by mouth 2 (two) times daily with a meal. 09/07/21   Domenic Polite, MD  nitroGLYCERIN (NITROSTAT) 0.4 MG SL tablet Place 1 tablet (0.4 mg total) under the tongue every 5 (five) minutes as needed for chest pain. 01/14/18 08/24/21  Erlene Quan, PA-C  oxyCODONE-acetaminophen (PERCOCET/ROXICET) 5-325 MG tablet Take 1 tablet by mouth every 6 (six) hours as needed. 11/26/18   Ulyses Amor, PA-C  PROAIR RESPICLICK 357 (90 Base) MCG/ACT AEPB Inhale 1 puff into  the lungs every 4 (four) hours as needed (shortness of breath). 08/02/21   [provider]  Torsemide 40 MG TABS Take 40 mg by mouth daily. 09/08/21   Domenic Polite, MD      Allergies    Hymenoptera venom preparations, Lisinopril, Piroxicam, Shellfish allergy, and Amoxicillin-pot clavulanate    Review of Systems   Review of Systems  Neurological:  Positive for headaches.  All other systems reviewed and are negative.   Physical Exam Updated Vital Signs BP 102/72   Pulse 80   Temp 97.7 F (36.5 C) (Oral)   Resp (!) 23   Ht '5\' 8"'$  (1.727 m)   Wt 63.5 kg   SpO2 95%   BMI 21.29 kg/m  Physical Exam Vitals and nursing note reviewed.  Constitutional:      Appearance: He is well-developed.     Comments: GCS 15, ABC intact  HENT:     Head: Normocephalic.     Comments: Right forehead hematoma present Eyes:     Conjunctiva/sclera: Conjunctivae normal.  Neck:     Comments: No midline tenderness to palpation of the cervical spine. ROM intact. Cardiovascular:     Rate and Rhythm: Normal rate and regular rhythm.  Pulmonary:     Effort: Pulmonary effort is normal. No respiratory distress.     Breath sounds: Normal breath sounds.  Chest:     Comments: Chest wall stable and non-tender to AP and  lateral compression. Clavicles stable and non-tender to AP compression.  ICD in place along the left chest wall Abdominal:     Palpations: Abdomen is soft.     Tenderness: There is no abdominal tenderness.     Comments: Pelvis stable to lateral compression.  Musculoskeletal:     Cervical back: Neck supple.     Comments: No midline tenderness to palpation of the thoracic or lumbar spine. Extremities atraumatic with intact ROM.  Right upper extremity AV fistula present with palpable thrill  Skin:    General: Skin is warm and dry.  Neurological:     Mental Status: He is alert.     Comments: CN II-XII grossly intact. Moving all four extremities spontaneously and sensation grossly intact.      ED Results / Procedures / Treatments   Labs (all labs ordered are listed, but only abnormal results are displayed) Labs Reviewed  RESP PANEL BY RT-PCR (FLU A&B, COVID) ARPGX2 - Abnormal; Notable for the following components:      Result Value   SARS Coronavirus 2 by RT PCR POSITIVE (*)    All other components within normal limits  COMPREHENSIVE METABOLIC PANEL - Abnormal; Notable for the following components:   Chloride 93 (*)    CO2 20 (*)    Glucose, Bld 147 (*)    BUN 170 (*)    Creatinine, Ser 11.73 (*)    Calcium 11.1 (*)    AST 44 (*)    ALT 71 (*)    Total Bilirubin 1.4 (*)    GFR, Estimated 4 (*)    Anion gap 22 (*)    All other components within normal limits  CBC - Abnormal; Notable for the following components:   WBC 10.6 (*)    RDW 19.8 (*)    All other components within normal limits  LACTIC ACID, PLASMA - Abnormal; Notable for the following components:   Lactic Acid, Venous 2.8 (*)    All other components within normal limits  PROTIME-INR - Abnormal; Notable for the following components:   Prothrombin Time 17.8 (*)    INR 1.5 (*)    All other components within normal limits  I-STAT CHEM 8, ED - Abnormal; Notable for the following components:   BUN >130 (*)    Creatinine, Ser 13.00 (*)    Glucose, Bld 140 (*)    All other components within normal limits  ETHANOL  URINALYSIS, ROUTINE W REFLEX MICROSCOPIC    EKG None  Radiology CT CERVICAL SPINE WO CONTRAST  Result Date: 12/30/2021 CLINICAL DATA:  Trauma. EXAM: CT CERVICAL SPINE WITHOUT CONTRAST TECHNIQUE: Multidetector CT imaging of the cervical spine was performed without intravenous contrast. Multiplanar CT image reconstructions were also generated. RADIATION DOSE REDUCTION: This exam was performed according to the departmental dose-optimization program which includes automated exposure control, adjustment of the mA and/or kV according to patient size and/or use of iterative reconstruction  technique. COMPARISON:  None Available. FINDINGS: Alignment: No traumatic malalignment. Skull base and vertebrae: No acute fracture. No primary bone lesion or focal pathologic process. Soft tissues and spinal canal: No prevertebral fluid or swelling. No visible canal hematoma. Disc levels: Mild multilevel disc height loss and uncovertebral hypertrophy. Moderate to severe left-sided facet arthropathy at C3-C4 and C4-C5. Bilateral C2-C3 facet ankylosis. Upper chest: Please see separate chest CT report from same day. Other: None. IMPRESSION: 1. No acute fracture or traumatic malalignment of the cervical spine. Electronically Signed   By: Orville Govern.D.  On: 12/30/2021 13:01   CT CHEST ABDOMEN PELVIS W CONTRAST  Result Date: 12/30/2021 CLINICAL DATA:  Trauma. EXAM: CT CHEST, ABDOMEN, AND PELVIS WITH CONTRAST TECHNIQUE: Multidetector CT imaging of the chest, abdomen and pelvis was performed following the standard protocol during bolus administration of intravenous contrast. RADIATION DOSE REDUCTION: This exam was performed according to the departmental dose-optimization program which includes automated exposure control, adjustment of the mA and/or kV according to patient size and/or use of iterative reconstruction technique. CONTRAST:  63m OMNIPAQUE IOHEXOL 350 MG/ML SOLN COMPARISON:  Chest x-ray from same day. CT abdomen pelvis dated August 24, 2021. FINDINGS: CT CHEST FINDINGS Cardiovascular: No significant vascular findings. Normal heart size. No pericardial effusion. No thoracic aortic aneurysm or dissection. Coronary, aortic arch, and branch vessel atherosclerotic vascular disease. No central pulmonary embolism. Left chest wall pacemaker. Mediastinum/Nodes: No enlarged mediastinal, hilar, or axillary lymph nodes. Thyroid gland and esophagus demonstrate no significant findings. Lungs/Pleura: Small amount of secretions in the trachea and right mainstem bronchus. No focal consolidation, pleural effusion, or  pneumothorax. Mild centrilobular emphysema. Musculoskeletal: No acute or significant osseous findings. CT ABDOMEN PELVIS FINDINGS Hepatobiliary: No hepatic injury or perihepatic hematoma. Layering gallbladder sludge. No biliary dilatation. Pancreas: Unremarkable. No pancreatic ductal dilatation or surrounding inflammatory changes. Spleen: No splenic injury or perisplenic hematoma. Adrenals/Urinary Tract: No adrenal hemorrhage or renal injury identified. Unchanged left-greater-than-right renal atrophy and bilateral renal simple cysts. No follow-up imaging is recommended. No renal calculi or hydronephrosis. Bladder is unremarkable for the degree of distention. Stomach/Bowel: Small hiatal hernia. The stomach is otherwise within normal limits. No bowel wall thickening, distention, or surrounding inflammatory changes. Normal appendix. Vascular/Lymphatic: Fusiform dilatation of the infrarenal abdominal aorta measuring up to 4.0 cm, unchanged. 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 Aortoiliac atherosclerotic vascular disease. The left internal iliac artery is occluded proximally with distal reconstitution. Reproductive: Unchanged mild prostatomegaly. Other: Unchanged small fat containing right inguinal hernia. No free fluid or pneumoperitoneum. Musculoskeletal: No acute or significant osseous findings. IMPRESSION: 1. No acute traumatic injury in the chest, abdomen, or pelvis. 2. Unchanged 4.0 cm infrarenal abdominal aortic aneurysm. Recommend follow-up every 12 months and vascular consultation. This recommendation follows ACR consensus guidelines: White Paper of the ACR Incidental Findings Committee II on Vascular Findings. J Am Coll Radiol 2013; 10:789-794. 3. Aortic Atherosclerosis (ICD10-I70.0) and Emphysema (ICD10-J43.9). Electronically Signed   By: WTitus DubinM.D.   On: 12/30/2021 12:58   CT HEAD WO CONTRAST  Result Date: 12/30/2021 CLINICAL  DATA:  Head trauma, moderate-severe EXAM: CT HEAD WITHOUT CONTRAST TECHNIQUE: Contiguous axial images were obtained from the base of the skull through the vertex without intravenous contrast. RADIATION DOSE REDUCTION: This exam was performed according to the departmental dose-optimization program which includes automated exposure control, adjustment of the mA and/or kV according to patient size and/or use of iterative reconstruction technique. COMPARISON:  None Available. CT head 12/28/2015. FINDINGS: Brain: Trace acute subarachnoid hemorrhage along the left frontal convexity (series 3, image 15; series 5, image 15). No evidence of acute large vascular territory infarct, mass lesion, midline shift, or hydrocephalus. Vascular: No hyperdense vessel identified. Calcific atherosclerosis. Skull: No acute fracture.  Right forehead contusion. Sinuses/Orbits: Visualized sinuses are clear. No acute orbital findings. Other: No mastoid effusions. IMPRESSION: 1. Trace acute subarachnoid hemorrhage along the left frontal convexity. 2. Right forehead contusion without acute calvarial fracture. Findings discussed with Dr. LArmandina Gemmavia telephone at 12:46 PM. Electronically Signed   By: FRoslynn Amble  Ronnald Ramp M.D.   On: 12/30/2021 12:48   DG Pelvis Portable  Result Date: 12/30/2021 CLINICAL DATA:  Fall.  Patient on blood thinners. EXAM: PORTABLE PELVIS 1-2 VIEWS COMPARISON:  None Available. FINDINGS: No fracture or bone lesion. Mild medial hip joint space narrowing. Bony prominence from the acetabular rims. SI joints and symphysis pubis are normally spaced and aligned. Aortoiliac and femoral artery vascular calcifications. IMPRESSION: 1. No fracture or acute finding. Electronically Signed   By: Lajean Manes M.D.   On: 12/30/2021 11:43   DG Chest Port 1 View  Result Date: 12/30/2021 CLINICAL DATA:  Fall.  Patient on blood thinners. EXAM: PORTABLE CHEST 1 VIEW COMPARISON:  08/24/2021. FINDINGS: Cardiac silhouette normal in size.  Stable left anterior chest wall AICD. No mediastinal or hilar masses. Clear lungs.  No pleural effusion or pneumothorax. Skeletal structures are grossly intact. IMPRESSION: No active disease. Electronically Signed   By: Lajean Manes M.D.   On: 12/30/2021 11:42    Procedures Procedures    Medications Ordered in ED Medications  iohexol (OMNIPAQUE) 350 MG/ML injection 75 mL (75 mLs Intravenous Contrast Given 12/30/21 1228)    ED Course/ Medical Decision Making/ A&P Clinical Course as of 12/30/21 1800  Sun Dec 30, 2021  1605 SARS Coronavirus 2 by RT PCR(!): POSITIVE [JL]  1759 Lactic Acid, Venous(!!): 2.8 [JL]  1759 BUN(!): 170 [JL]  1759 Creatinine(!): 11.73 [JL]  1759 Potassium: 4.4 [JL]    Clinical Course User Index [JL] Regan Lemming, MD                           Medical Decision Making Amount and/or Complexity of Data Reviewed Labs: ordered. Decision-making details documented in ED Course. Radiology: ordered.  Risk Prescription drug management.   81 year old male presenting to the emergency department as a level 2 trauma due to a fall on Eliquis.  The patient has a history of atrial fibrillation, hypertension, hyperlipidemia, CAD, CHF, gout, ischemic cardiomyopathy with an ICD in place ([place]), AAA.  The patient is in [place] hospice patient as of 09/08/2021.  Per EMS, he is not quite at his baseline mental status.  He complains of headache in the vicinity of a scalp hematoma on the right but otherwise has no other complaints or signs of injury.  He arrived GCS 14, ABC intact.  Pacemaker interrogated without acute recent events per the patient's pacemaker representative.  Pt with a SAH on CT. Dr. Annette Stable and PA [facility] consulted.  Plan will be for repeat 6-hour CT head for repeat assessment of the patient's hemorrhage.  The patient was found to be COVID-positive.  He was also found to be in worsening renal failure.  I did speak with the patient's grandson, Anthony Foster at  [phone] who is one of the patient's healthcare POA's.  He states that the patient is not a candidate for dialysis.  He is at hospice and at home.  He has a nursing visit scheduled for tomorrow.  He did understand that the patient may be in the latter stages of the dying process.  His laboratory evaluation is significant for COVID-19 positive, CMP with severe uremia with a BUN of 170, creatinine of 11.73, potassium of 4.4, anion gap acidosis with a bicarbonate of 20, anion gap of 22, lactic acidosis with a lactic acid of 2.8, mild leukocytosis to 10.6.  Trauma work-up beyond the CT head which showed a subarachnoid hemorrhage was negative for acute traumatic injury with a  normal CT cervical spine and CT chest abdomen pelvis with contrast.    On chart review, the patient has repeatedly refused dialysis in the past despite having an AV fistula.  I believe that the patient does not have capacity to make his own decisions at this time in the setting of a subarachnoid hemorrhage and a BUN of 170.  I discussed the patient's care extensively with the patient's grandson, Anthony Foster, who has been in discussion with other family members.  They state that the patient would not want dialysis.  They do request a repeat CT head to determine if the patient's subarachnoid hemorrhage is worsening.  I did inform them that it is likely that no intervention will be performed even in the event of a worsening head bleed given the patient's current status of hospice.  Plan to follow-up repeat CT head, likely plan for discharge back home later tonight.  Signout given to Dr. Vanita Panda at 1800.    Final Clinical Impression(s) / ED Diagnoses Final diagnoses:  Fall, initial encounter  Contusion of scalp, initial encounter  SAH (subarachnoid hemorrhage) ([place])  COVID-19  Uremia  ESRD (end stage renal disease) [facility])  Hospice care patient    Rx / DC Orders ED Discharge Orders     None         Regan Lemming, MD 12/30/21 1758

## 2021-12-30 NOTE — ED Notes (Signed)
Pacemaker interrogated. 

## 2021-12-30 NOTE — ED Notes (Signed)
RN gave pt sandwich and ginger ale

## 2021-12-30 NOTE — ED Notes (Signed)
Patient transported to CT 

## 2021-12-30 NOTE — ED Notes (Signed)
Trauma Response Nurse Documentation   Anthony Foster is a 81 y.o. male arriving to [facility] ED via EMS  On Eliquis (apixaban) daily. Trauma was activated as a Level 2 based on the following trauma criteria Elderly patients > 65 with head trauma on anti-coagulation (excluding ASA). Trauma team at the bedside on patient arrival. Patient cleared for CT by Dr. Armandina Gemma. Patient to CT with team. GCS 15.  History   Past Medical History:  Diagnosis Date   AAA (abdominal aortic aneurysm) (HCC)    3.5 cm 04/2018   Atrial fibrillation (HCC)    CHF (congestive heart failure) (HCC)    Coronary artery disease    Prior stent   Degenerative joint disease    Full dentures    Gout    Hypercholesteremia    Hypertension    ICD (implantable cardioverter-defibrillator) battery depletion    Ischemic cardiomyopathy    Pneumonia    Renal insufficiency    Wears glasses      Past Surgical History:  Procedure Laterality Date   AV FISTULA PLACEMENT Right 11/26/2018   Procedure:     Right arm Brachiocephalic Fistula Creation   ;  Surgeon: Waynetta Sandy, MD;  Location: [facility];  Service: Vascular;  Laterality: Right;   BACK SURGERY     CARDIAC SURGERY     CATARACT EXTRACTION W/ INTRAOCULAR LENS  IMPLANT, BILATERAL     CENTRAL LINE INSERTION  08/28/2021   Procedure: CENTRAL LINE INSERTION;  Surgeon: Jolaine Artist, MD;  Location: [facility];  Service: Cardiovascular;;   COLONOSCOPY W/ BIOPSIES AND POLYPECTOMY     MASS EXCISION     neck   MULTIPLE TOOTH EXTRACTIONS     PACEMAKER PLACEMENT     RIGHT HEART CATH N/A 08/28/2021   Procedure: RIGHT HEART CATH;  Surgeon: Jolaine Artist, MD;  Location: [place] [facility];  Service: Cardiovascular;  Laterality: N/A;   SHOULDER SURGERY     left   TOTAL SHOULDER REPLACEMENT     WRIST SURGERY       Initial Focused Assessment (If applicable, or please see trauma documentation): Hematoma to right forehead, GCS 15 No other obvious  trauma  CT's Completed:   CT Head, CT C-Spine, CT Chest w/ contrast, and CT abdomen/pelvis w/ contrast   Interventions:  IV, labs CXR/PXR CT Head, Cspine, C/A/P  Plan for disposition:  Admission to [facility]   Consults completed:  Neurosurgeon at see consult.  Event Summary: Neurosurgery consult for [facility], patient resting comfortably.  Bedside handoff with ED RN Hassan Rowan.    Park Pope Mykenzie Ebanks  Trauma Response RN  Please call TRN at [phone] for further assistance.

## 2021-12-31 DIAGNOSIS — I4891 Unspecified atrial fibrillation: Secondary | ICD-10-CM | POA: Diagnosis not present

## 2021-12-31 DIAGNOSIS — J449 Chronic obstructive pulmonary disease, unspecified: Secondary | ICD-10-CM | POA: Diagnosis not present

## 2021-12-31 DIAGNOSIS — N185 Chronic kidney disease, stage 5: Secondary | ICD-10-CM | POA: Diagnosis not present

## 2021-12-31 DIAGNOSIS — I714 Abdominal aortic aneurysm, without rupture, unspecified: Secondary | ICD-10-CM | POA: Diagnosis not present

## 2021-12-31 DIAGNOSIS — I132 Hypertensive heart and chronic kidney disease with heart failure and with stage 5 chronic kidney disease, or end stage renal disease: Secondary | ICD-10-CM | POA: Diagnosis not present

## 2021-12-31 DIAGNOSIS — I5040 Unspecified combined systolic (congestive) and diastolic (congestive) heart failure: Secondary | ICD-10-CM | POA: Diagnosis not present

## 2022-01-03 DIAGNOSIS — N185 Chronic kidney disease, stage 5: Secondary | ICD-10-CM | POA: Diagnosis not present

## 2022-01-03 DIAGNOSIS — I714 Abdominal aortic aneurysm, without rupture, unspecified: Secondary | ICD-10-CM | POA: Diagnosis not present

## 2022-01-03 DIAGNOSIS — I4891 Unspecified atrial fibrillation: Secondary | ICD-10-CM | POA: Diagnosis not present

## 2022-01-03 DIAGNOSIS — I5040 Unspecified combined systolic (congestive) and diastolic (congestive) heart failure: Secondary | ICD-10-CM | POA: Diagnosis not present

## 2022-01-03 DIAGNOSIS — I132 Hypertensive heart and chronic kidney disease with heart failure and with stage 5 chronic kidney disease, or end stage renal disease: Secondary | ICD-10-CM | POA: Diagnosis not present

## 2022-01-03 DIAGNOSIS — J449 Chronic obstructive pulmonary disease, unspecified: Secondary | ICD-10-CM | POA: Diagnosis not present

## 2022-01-04 DIAGNOSIS — I5040 Unspecified combined systolic (congestive) and diastolic (congestive) heart failure: Secondary | ICD-10-CM | POA: Diagnosis not present

## 2022-01-04 DIAGNOSIS — I132 Hypertensive heart and chronic kidney disease with heart failure and with stage 5 chronic kidney disease, or end stage renal disease: Secondary | ICD-10-CM | POA: Diagnosis not present

## 2022-01-04 DIAGNOSIS — N185 Chronic kidney disease, stage 5: Secondary | ICD-10-CM | POA: Diagnosis not present

## 2022-01-04 DIAGNOSIS — I4891 Unspecified atrial fibrillation: Secondary | ICD-10-CM | POA: Diagnosis not present

## 2022-01-04 DIAGNOSIS — J449 Chronic obstructive pulmonary disease, unspecified: Secondary | ICD-10-CM | POA: Diagnosis not present

## 2022-01-04 DIAGNOSIS — I714 Abdominal aortic aneurysm, without rupture, unspecified: Secondary | ICD-10-CM | POA: Diagnosis not present

## 2022-01-05 ENCOUNTER — Inpatient Hospital Stay (HOSPITAL_COMMUNITY)
Admission: EM | Admit: 2022-01-05 | Discharge: 2022-01-07 | DRG: 640 | Disposition: A | Discharge status: Home / Self Care | Attending: Internal Medicine | Admitting: Internal Medicine

## 2022-01-05 ENCOUNTER — Other Ambulatory Visit: Payer: Self-pay

## 2022-01-05 ENCOUNTER — Encounter (HOSPITAL_COMMUNITY): Payer: Self-pay | Admitting: Emergency Medicine

## 2022-01-05 ENCOUNTER — Emergency Department (HOSPITAL_COMMUNITY)

## 2022-01-05 DIAGNOSIS — E875 Hyperkalemia: Secondary | ICD-10-CM | POA: Diagnosis not present

## 2022-01-05 DIAGNOSIS — I714 Abdominal aortic aneurysm, without rupture, unspecified: Secondary | ICD-10-CM | POA: Diagnosis not present

## 2022-01-05 DIAGNOSIS — I4891 Unspecified atrial fibrillation: Secondary | ICD-10-CM | POA: Diagnosis not present

## 2022-01-05 DIAGNOSIS — I132 Hypertensive heart and chronic kidney disease with heart failure and with stage 5 chronic kidney disease, or end stage renal disease: Secondary | ICD-10-CM | POA: Diagnosis not present

## 2022-01-05 DIAGNOSIS — I10 Essential (primary) hypertension: Secondary | ICD-10-CM | POA: Diagnosis not present

## 2022-01-05 DIAGNOSIS — Z888 Allergy status to other drugs, medicaments and biological substances status: Secondary | ICD-10-CM

## 2022-01-05 DIAGNOSIS — Z91013 Allergy to seafood: Secondary | ICD-10-CM

## 2022-01-05 DIAGNOSIS — I700 Atherosclerosis of aorta: Secondary | ICD-10-CM | POA: Diagnosis not present

## 2022-01-05 DIAGNOSIS — Z992 Dependence on renal dialysis: Secondary | ICD-10-CM

## 2022-01-05 DIAGNOSIS — U071 COVID-19: Secondary | ICD-10-CM | POA: Diagnosis present

## 2022-01-05 DIAGNOSIS — Z833 Family history of diabetes mellitus: Secondary | ICD-10-CM

## 2022-01-05 DIAGNOSIS — N179 Acute kidney failure, unspecified: Secondary | ICD-10-CM | POA: Diagnosis present

## 2022-01-05 DIAGNOSIS — I2510 Atherosclerotic heart disease of native coronary artery without angina pectoris: Secondary | ICD-10-CM | POA: Diagnosis present

## 2022-01-05 DIAGNOSIS — R112 Nausea with vomiting, unspecified: Secondary | ICD-10-CM | POA: Diagnosis not present

## 2022-01-05 DIAGNOSIS — D638 Anemia in other chronic diseases classified elsewhere: Secondary | ICD-10-CM | POA: Diagnosis present

## 2022-01-05 DIAGNOSIS — I959 Hypotension, unspecified: Secondary | ICD-10-CM | POA: Diagnosis not present

## 2022-01-05 DIAGNOSIS — Z7901 Long term (current) use of anticoagulants: Secondary | ICD-10-CM

## 2022-01-05 DIAGNOSIS — F17290 Nicotine dependence, other tobacco product, uncomplicated: Secondary | ICD-10-CM | POA: Diagnosis present

## 2022-01-05 DIAGNOSIS — E785 Hyperlipidemia, unspecified: Secondary | ICD-10-CM | POA: Diagnosis present

## 2022-01-05 DIAGNOSIS — Z880 Allergy status to penicillin: Secondary | ICD-10-CM

## 2022-01-05 DIAGNOSIS — N185 Chronic kidney disease, stage 5: Secondary | ICD-10-CM | POA: Diagnosis not present

## 2022-01-05 DIAGNOSIS — R0902 Hypoxemia: Secondary | ICD-10-CM | POA: Diagnosis not present

## 2022-01-05 DIAGNOSIS — E7800 Pure hypercholesterolemia, unspecified: Secondary | ICD-10-CM | POA: Diagnosis present

## 2022-01-05 DIAGNOSIS — I255 Ischemic cardiomyopathy: Secondary | ICD-10-CM | POA: Diagnosis present

## 2022-01-05 DIAGNOSIS — Z79899 Other long term (current) drug therapy: Secondary | ICD-10-CM

## 2022-01-05 DIAGNOSIS — M109 Gout, unspecified: Secondary | ICD-10-CM | POA: Diagnosis present

## 2022-01-05 DIAGNOSIS — N186 End stage renal disease: Secondary | ICD-10-CM | POA: Diagnosis present

## 2022-01-05 DIAGNOSIS — I480 Paroxysmal atrial fibrillation: Secondary | ICD-10-CM | POA: Diagnosis present

## 2022-01-05 DIAGNOSIS — I5022 Chronic systolic (congestive) heart failure: Secondary | ICD-10-CM | POA: Diagnosis present

## 2022-01-05 DIAGNOSIS — I5040 Unspecified combined systolic (congestive) and diastolic (congestive) heart failure: Secondary | ICD-10-CM | POA: Diagnosis not present

## 2022-01-05 DIAGNOSIS — I482 Chronic atrial fibrillation, unspecified: Secondary | ICD-10-CM | POA: Diagnosis present

## 2022-01-05 DIAGNOSIS — I420 Dilated cardiomyopathy: Secondary | ICD-10-CM | POA: Diagnosis present

## 2022-01-05 DIAGNOSIS — J449 Chronic obstructive pulmonary disease, unspecified: Secondary | ICD-10-CM | POA: Diagnosis not present

## 2022-01-05 DIAGNOSIS — Z95810 Presence of automatic (implantable) cardiac defibrillator: Secondary | ICD-10-CM

## 2022-01-05 DIAGNOSIS — R001 Bradycardia, unspecified: Secondary | ICD-10-CM | POA: Diagnosis not present

## 2022-01-05 DIAGNOSIS — R109 Unspecified abdominal pain: Secondary | ICD-10-CM | POA: Diagnosis not present

## 2022-01-05 DIAGNOSIS — S0990XA Unspecified injury of head, initial encounter: Secondary | ICD-10-CM | POA: Diagnosis not present

## 2022-01-05 LAB — CBC WITH DIFFERENTIAL/PLATELET
Abs Immature Granulocytes: 0.07 10*3/uL (ref 0.00–0.07)
Basophils Absolute: 0 10*3/uL (ref 0.0–0.1)
Basophils Relative: 0 %
Eosinophils Absolute: 0.1 10*3/uL (ref 0.0–0.5)
Eosinophils Relative: 1 %
HCT: 35.9 % — ABNORMAL LOW (ref 39.0–52.0)
Hemoglobin: 12 g/dL — ABNORMAL LOW (ref 13.0–17.0)
Immature Granulocytes: 1 %
Lymphocytes Relative: 9 %
Lymphs Abs: 0.9 10*3/uL (ref 0.7–4.0)
MCH: 27.3 pg (ref 26.0–34.0)
MCHC: 33.4 g/dL (ref 30.0–36.0)
MCV: 81.8 fL (ref 80.0–100.0)
Monocytes Absolute: 1 10*3/uL (ref 0.1–1.0)
Monocytes Relative: 10 %
Neutro Abs: 7.7 10*3/uL (ref 1.7–7.7)
Neutrophils Relative %: 79 %
Platelets: 147 10*3/uL — ABNORMAL LOW (ref 150–400)
RBC: 4.39 MIL/uL (ref 4.22–5.81)
RDW: 18.8 % — ABNORMAL HIGH (ref 11.5–15.5)
WBC: 9.7 10*3/uL (ref 4.0–10.5)
nRBC: 0 % (ref 0.0–0.2)

## 2022-01-05 LAB — URINALYSIS, ROUTINE W REFLEX MICROSCOPIC
Bilirubin Urine: NEGATIVE
Glucose, UA: NEGATIVE mg/dL
Hgb urine dipstick: NEGATIVE
Ketones, ur: NEGATIVE mg/dL
Leukocytes,Ua: NEGATIVE
Nitrite: NEGATIVE
Protein, ur: NEGATIVE mg/dL
Specific Gravity, Urine: 1.014 (ref 1.005–1.030)
pH: 5 (ref 5.0–8.0)

## 2022-01-05 MED ORDER — LACTATED RINGERS IV SOLN: INTRAVENOUS | Status: DC

## 2022-01-05 MED ORDER — ONDANSETRON HCL 4 MG/2ML IJ SOLN
4.0000 mg | Freq: Once | INTRAMUSCULAR | Status: AC
Start: 2022-01-05 — End: 2022-01-05
  Administered 2022-01-05: 4 mg via INTRAVENOUS
  Filled 2022-01-05: qty 2

## 2022-01-05 NOTE — ED Triage Notes (Signed)
Pt BIB [facility] from home, pt a hospice pt, c/o nausea/vomiting x 3 days. Has been unable to keep food and medicines down. Pt denies pain. Given 500ccLR, and '4mg'$  zofran. EMS VS: BP 111/36, HR 50

## 2022-01-05 NOTE — ED Provider Notes (Incomplete)
[place] [facility] EMERGENCY DEPARTMENT Provider Note   CSN: [id number] Arrival date & time: 01/05/22  2117     History {Add pertinent medical, surgical, social history, OB history to HPI:1} Chief Complaint  Patient presents with  . Emesis    CHEVIS WEISENSEL is a 81 y.o. male.  81 year old male who is followed by hospice care who is full code presents today for evaluation of not feeling well over the past several days.  Reports nausea vomiting and not been able to tolerate p.o. intake.  Denies pain anywhere.  Denies any other complaints.  Limited historian.  Patient also discussed with his grandson who the patient lives with.  Grandson states patient mentioned to him that he was not feeling well and wanted to be evaluated in the emergency room so EMS was activated.  I spoke with [facility] nurse who mentions that her multiple recent visits for nausea and vomiting.  They encourage patient to take scheduled Zofran prior to meals which it appears he has not done.   The history is provided by the patient. No language interpreter was used.       Home Medications Prior to Admission medications   Medication Sig Start Date End Date Taking? Authorizing Provider  allopurinol (ZYLOPRIM) 300 MG tablet Take 300 mg by mouth daily. 07/26/16   [provider]  amiodarone (PACERONE) 200 MG tablet Take 1 tablet (200 mg total) by mouth 2 (two) times daily. 09/07/21   Domenic Polite, MD  apixaban (ELIQUIS) 2.5 MG TABS tablet Take 1 tablet (2.5 mg total) by mouth daily. 06/10/19   Lelon Perla, MD  atorvastatin (LIPITOR) 80 MG tablet Take 1 tablet (80 mg total) by mouth daily. 09/12/15   Lelon Perla, MD  midodrine (PROAMATINE) 10 MG tablet Take 1 tablet (10 mg total) by mouth 2 (two) times daily with a meal. 09/07/21   Domenic Polite, MD  nitroGLYCERIN (NITROSTAT) 0.4 MG SL tablet Place 1 tablet (0.4 mg total) under the tongue every 5 (five) minutes as needed for chest  pain. 01/14/18 08/24/21  Erlene Quan, PA-C  oxyCODONE-acetaminophen (PERCOCET/ROXICET) 5-325 MG tablet Take 1 tablet by mouth every 6 (six) hours as needed. 11/26/18   Ulyses Amor, PA-C  PROAIR RESPICLICK 638 (90 Base) MCG/ACT AEPB Inhale 1 puff into the lungs every 4 (four) hours as needed (shortness of breath). 08/02/21   [provider]  Torsemide 40 MG TABS Take 40 mg by mouth daily. 09/08/21   Domenic Polite, MD      Allergies    Hymenoptera venom preparations, Lisinopril, Piroxicam, Shellfish allergy, and Amoxicillin-pot clavulanate    Review of Systems   Review of Systems  Constitutional:  Negative for chills and fever.  Respiratory:  Negative for shortness of breath.   Cardiovascular:  Negative for chest pain.  Gastrointestinal:  Positive for nausea and vomiting. Negative for abdominal pain.  Genitourinary:  Negative for dysuria.  Neurological:  Negative for light-headedness.  All other systems reviewed and are negative.   Physical Exam Updated Vital Signs BP 125/74   Pulse (!) 51   Temp (!) 97 F (36.1 C) (Temporal)   Resp 17   SpO2 100%  Physical Exam Vitals and nursing note reviewed.  Constitutional:      General: He is not in acute distress.    Appearance: Normal appearance. He is not ill-appearing.  HENT:     Head: Normocephalic and atraumatic.     Nose: Nose normal.  Eyes:  Conjunctiva/sclera: Conjunctivae normal.  Cardiovascular:     Rate and Rhythm: Regular rhythm. Bradycardia present.  Pulmonary:     Effort: Pulmonary effort is normal. No respiratory distress.     Breath sounds: Normal breath sounds. No wheezing or rales.  Abdominal:     General: There is no distension.     Palpations: Abdomen is soft.     Tenderness: There is abdominal tenderness. There is no right CVA tenderness, left CVA tenderness, guarding or rebound.  Musculoskeletal:        General: No deformity.     Right lower leg: No edema.     Left lower leg: No edema.   Skin:    Findings: No rash.  Neurological:     Mental Status: He is alert.     ED Results / Procedures / Treatments   Labs (all labs ordered are listed, but only abnormal results are displayed) Labs Reviewed  CBC WITH DIFFERENTIAL/PLATELET - Abnormal; Notable for the following components:      Result Value   Hemoglobin 12.0 (*)    HCT 35.9 (*)    RDW 18.8 (*)    Platelets 147 (*)    All other components within normal limits  URINALYSIS, ROUTINE W REFLEX MICROSCOPIC  COMPREHENSIVE METABOLIC PANEL  LIPASE, BLOOD    EKG EKG Interpretation  Date/Time:  Saturday January 05 2022 21:26:28 EDT Ventricular Rate:  50 PR Interval:  349 QRS Duration: 174 QT Interval:  541 QTC Calculation: 494 R Axis:   246 Text Interpretation: Sinus rhythm Prolonged PR interval Right atrial enlargement Nonspecific IVCD with LAD Confirmed by Elnora Morrison [phone]) on 01/05/2022 10:37:19 PM  Radiology CT ABDOMEN PELVIS WO CONTRAST  Result Date: 01/05/2022 CLINICAL DATA:  Acute abdominal pain EXAM: CT ABDOMEN AND PELVIS WITHOUT CONTRAST TECHNIQUE: Multidetector CT imaging of the abdomen and pelvis was performed following the standard protocol without IV contrast. Unenhanced CT was performed per clinician order. Lack of IV contrast limits sensitivity and specificity, especially for evaluation of abdominal/pelvic solid viscera. RADIATION DOSE REDUCTION: This exam was performed according to the departmental dose-optimization program which includes automated exposure control, adjustment of the mA and/or kV according to patient size and/or use of iterative reconstruction technique. COMPARISON:  12/30/2021 FINDINGS: Lower chest: No acute pleural or parenchymal lung disease. Hepatobiliary: High attenuation material within the gallbladder may reflect sludge or vicarious excretion of previously administered contrast. No calcified gallstones or evidence of cholecystitis. Unremarkable unenhanced appearance of the liver.  Pancreas: Unremarkable unenhanced appearance. Spleen: Unremarkable unenhanced appearance. Adrenals/Urinary Tract: There is bilateral renal cortical atrophy, with retention of previously administered contrast within the renal parenchyma, consistent with end-stage renal disease. Stable simple cyst upper pole right kidney. No urinary tract calculi or obstructive uropathy. High attenuation material within the bladder lumen consistent with excretion of previously administered contrast. The adrenals are stable. Stomach/Bowel: No bowel obstruction or ileus. Normal appendix right lower quadrant. No bowel wall thickening or inflammatory change. Vascular/Lymphatic: Stable fusiform infrarenal abdominal aortic aneurysm measuring up to 3.9 cm. Bilateral common iliac artery aneurysms, measuring 2 cm on the right and 2.3 cm on the left. Evaluation of the aortic lumen is limited without IV contrast. No evidence of aneurysm leak or rupture. Diffuse atherosclerosis is again noted. No pathologic adenopathy. Reproductive: Stable enlargement of the prostate. Other: No free fluid or free intraperitoneal gas. Stable fat containing right inguinal hernia. Musculoskeletal: No acute or destructive bony lesions. Reconstructed images demonstrate no additional findings. IMPRESSION: 1. No acute intra-abdominal or intrapelvic  process. 2. 3.9 cm infrarenal abdominal aortic aneurysm. Recommend follow-up every 2 years. Reference: J Am Coll Radiol 1740;81:448-185. 3. Sequela of chronic renal insufficiency, with retention of contrast within the renal parenchyma, delayed excretion of contrast in the bladder, and vicarious excretion of contrast within the gallbladder. 4. Stable enlarged prostate. 5. Stable fat containing right inguinal hernia. 6.  Aortic Atherosclerosis (ICD10-I70.0). Electronically Signed   By: Randa Ngo M.D.   On: 01/05/2022 23:24   CT Head Wo Contrast  Result Date: 01/05/2022 CLINICAL DATA:  Head trauma. EXAM: CT HEAD WITHOUT  CONTRAST TECHNIQUE: Contiguous axial images were obtained from the base of the skull through the vertex without intravenous contrast. RADIATION DOSE REDUCTION: This exam was performed according to the departmental dose-optimization program which includes automated exposure control, adjustment of the mA and/or kV according to patient size and/or use of iterative reconstruction technique. COMPARISON:  Head CT dated 12/30/2021. FINDINGS: Brain: Mild age-related atrophy and chronic microvascular ischemic changes. There is no acute intracranial hemorrhage. No mass effect or midline shift. No extra-axial fluid collection. Vascular: No hyperdense vessel or unexpected calcification. Skull: Normal. Negative for fracture or focal lesion. Sinuses/Orbits: No acute finding. Other: None IMPRESSION: 1. No acute intracranial pathology. 2. Mild age-related atrophy and chronic microvascular ischemic changes. Electronically Signed   By: Anner Crete M.D.   On: 01/05/2022 23:24    Procedures Procedures  {Document cardiac monitor, telemetry assessment procedure when appropriate:1}  Medications Ordered in ED Medications  lactated ringers infusion ( Intravenous New Bag/Given 01/05/22 2245)  ondansetron (ZOFRAN) injection 4 mg (4 mg Intravenous Given 01/05/22 2245)    ED Course/ Medical Decision Making/ A&P                           Medical Decision Making Amount and/or Complexity of Data Reviewed Labs: ordered. Radiology: ordered.  Risk Prescription drug management.   Medical Decision Making / ED Course   This patient presents to the ED for concern of nausea, vomiting, this involves an extensive number of treatment options, and is a complaint that carries with it a high risk of complications and morbidity.  The differential diagnosis includes gastroenteritis, chronic illness, pancreatitis, ICH  MDM: 81 year old male with past medical history of CHF on hospice care presents today for evaluation of not  feeling well.  Reports nausea vomiting and unable to keep any food or drink down.  Denies pain.  Does have abdominal tenderness on exam.  Will evaluate with basic labs, UA, CT head given recent fall and subarachnoid hemorrhage, CT abdominal pelvis without contrast.  He does have renal failure with recent creatinine of 13.  He was advised dialysis but he refused.  He states if this worsens he is not willing to take dialysis.  Will provide Zofran. Discussed with son who states patient suddenly today requested to be sent to the emergency room because he was not feeling well.  I spoke with nursing line for [facility] state patient has had multiple visits for the symptoms and was instructed to take Zofran prior to meals. Patient remains without nausea and vomiting in the emergency room.  CT head shows no acute findings.  CT abdomen without acute intra-abdominal process.  Of note this was a noncontrast study.  CBC without leukocytosis, hemoglobin of 12 which is not significantly removed from his baseline.  UA without UTI.  CMP still pending.  Unfortunately patient's blood hemolyzed and will require redraw.  Patient at the end  of my shift has been signed out to Dr. Roxanne Mins for follow-up on labs and disposition.  Notify patient that if his blood work is stable he will be discharged back home.  He is in agreement with this plan.   Lab Tests: -I ordered, reviewed, and interpreted labs.   The pertinent results include:   Labs Reviewed  CBC WITH DIFFERENTIAL/PLATELET - Abnormal; Notable for the following components:      Result Value   Hemoglobin 12.0 (*)    HCT 35.9 (*)    RDW 18.8 (*)    Platelets 147 (*)    All other components within normal limits  URINALYSIS, ROUTINE W REFLEX MICROSCOPIC - Abnormal; Notable for the following components:   APPearance HAZY (*)    All other components within normal limits  COMPREHENSIVE METABOLIC PANEL  LIPASE, BLOOD      EKG  EKG Interpretation  Date/Time:  Saturday  January 05 2022 21:26:28 EDT Ventricular Rate:  50 PR Interval:  349 QRS Duration: 174 QT Interval:  541 QTC Calculation: 494 R Axis:   246 Text Interpretation: Sinus rhythm Prolonged PR interval Right atrial enlargement Nonspecific IVCD with LAD Confirmed by Elnora Morrison [phone]) on 01/05/2022 10:37:19 PM         Imaging Studies ordered: I ordered imaging studies including CT abdomen pelvis with contra I independently visualized and interpreted imaging. I agree with the radiologist interpretation   Medicines ordered and prescription drug management: Meds ordered this encounter  Medications  . ondansetron (ZOFRAN) injection 4 mg  . lactated ringers infusion    -I have reviewed the patients home medicines and have made adjustments as needed  Cardiac Monitoring: The patient was maintained on a cardiac monitor.  I personally viewed and interpreted the cardiac monitored which showed an underlying rhythm of: Paced rhythm.  Bradycardic  Reevaluation: After the interventions noted above, I reevaluated the patient and found that they have :stayed the same  Co morbidities that complicate the patient evaluation . Past Medical History:  Diagnosis Date  . AAA (abdominal aortic aneurysm) ([facility])    3.5 cm 04/2018  . Atrial fibrillation ([place])   . CHF (congestive heart failure) ([place])   . Coronary artery disease    Prior stent  . Degenerative joint disease   . Full dentures   . Gout   . Hypercholesteremia   . Hypertension   . 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 Ischemic cardiomyopathy   . Pneumonia   . Renal insufficiency   . Wears glasses       Dispostion: Patient at the end my shift is awaiting blood work and disposition.  Remains without symptoms.  Patient signed out to Dr. Roxanne Mins for follow-up.  Final Clinical Impression(s) / ED Diagnoses Final diagnoses:  None    Rx / DC Orders ED Discharge Orders     None

## 2022-01-05 NOTE — ED Provider Notes (Signed)
[facility] [address] EMERGENCY DEPARTMENT Provider Note   CSN: [id number] Arrival date & time: 01/05/22  2117     History  Chief Complaint  Patient presents with   Emesis    Anthony Foster is a 81 y.o. male.  81 year old male who is followed by hospice care who is full code presents today for evaluation of not feeling well over the past several days.  Reports nausea vomiting and not been able to tolerate p.o. intake.  Denies pain anywhere.  Denies any other complaints.  Limited historian.  Patient also discussed with his grandson who the patient lives with.  Grandson states patient mentioned to him that he was not feeling well and wanted to be evaluated in the emergency room so EMS was activated.  I spoke with [facility] nurse who mentions that her multiple recent visits for nausea and vomiting.  They encourage patient to take scheduled Zofran prior to meals which it appears he has not done.   The history is provided by the patient. No language interpreter was used.       Home Medications Prior to Admission medications   Medication Sig Start Date End Date Taking? Authorizing Provider  allopurinol (ZYLOPRIM) 300 MG tablet Take 300 mg by mouth daily. 07/26/16   [provider]  amiodarone (PACERONE) 200 MG tablet Take 1 tablet (200 mg total) by mouth 2 (two) times daily. 09/07/21   Domenic Polite, MD  apixaban (ELIQUIS) 2.5 MG TABS tablet Take 1 tablet (2.5 mg total) by mouth daily. 06/10/19   Lelon Perla, MD  atorvastatin (LIPITOR) 80 MG tablet Take 1 tablet (80 mg total) by mouth daily. 09/12/15   Lelon Perla, MD  midodrine (PROAMATINE) 10 MG tablet Take 1 tablet (10 mg total) by mouth 2 (two) times daily with a meal. 09/07/21   Domenic Polite, MD  nitroGLYCERIN (NITROSTAT) 0.4 MG SL tablet Place 1 tablet (0.4 mg total) under the tongue every 5 (five) minutes as needed for chest pain. 01/14/18 08/24/21  Erlene Quan, PA-C  oxyCODONE-acetaminophen  (PERCOCET/ROXICET) 5-325 MG tablet Take 1 tablet by mouth every 6 (six) hours as needed. 11/26/18   Ulyses Amor, PA-C  PROAIR RESPICLICK 361 (90 Base) MCG/ACT AEPB Inhale 1 puff into the lungs every 4 (four) hours as needed (shortness of breath). 08/02/21   [provider]  Torsemide 40 MG TABS Take 40 mg by mouth daily. 09/08/21   Domenic Polite, MD      Allergies    Hymenoptera venom preparations, Lisinopril, Piroxicam, Shellfish allergy, and Amoxicillin-pot clavulanate    Review of Systems   Review of Systems  Constitutional:  Negative for chills and fever.  Respiratory:  Negative for shortness of breath.   Cardiovascular:  Negative for chest pain.  Gastrointestinal:  Positive for nausea and vomiting. Negative for abdominal pain.  Genitourinary:  Negative for dysuria.  Neurological:  Negative for light-headedness.  All other systems reviewed and are negative.   Physical Exam Updated Vital Signs BP 125/74   Pulse (!) 51   Temp (!) 97 F (36.1 C) (Temporal)   Resp 17   SpO2 100%  Physical Exam Vitals and nursing note reviewed.  Constitutional:      General: He is not in acute distress.    Appearance: Normal appearance. He is not ill-appearing.  HENT:     Head: Normocephalic and atraumatic.     Nose: Nose normal.  Eyes:     Conjunctiva/sclera: Conjunctivae normal.  Cardiovascular:  Rate and Rhythm: Regular rhythm. Bradycardia present.  Pulmonary:     Effort: Pulmonary effort is normal. No respiratory distress.     Breath sounds: Normal breath sounds. No wheezing or rales.  Abdominal:     General: There is no distension.     Palpations: Abdomen is soft.     Tenderness: There is abdominal tenderness. There is no right CVA tenderness, left CVA tenderness, guarding or rebound.  Musculoskeletal:        General: No deformity.     Right lower leg: No edema.     Left lower leg: No edema.  Skin:    Findings: No rash.  Neurological:     Mental Status: He is  alert.     ED Results / Procedures / Treatments   Labs (all labs ordered are listed, but only abnormal results are displayed) Labs Reviewed  CBC WITH DIFFERENTIAL/PLATELET - Abnormal; Notable for the following components:      Result Value   Hemoglobin 12.0 (*)    HCT 35.9 (*)    RDW 18.8 (*)    Platelets 147 (*)    All other components within normal limits  URINALYSIS, ROUTINE W REFLEX MICROSCOPIC  COMPREHENSIVE METABOLIC PANEL  LIPASE, BLOOD    EKG EKG Interpretation  Date/Time:  Saturday January 05 2022 21:26:28 EDT Ventricular Rate:  50 PR Interval:  349 QRS Duration: 174 QT Interval:  541 QTC Calculation: 494 R Axis:   246 Text Interpretation: Sinus rhythm Prolonged PR interval Right atrial enlargement Nonspecific IVCD with LAD Confirmed by Elnora Morrison [phone]) on 01/05/2022 10:37:19 PM  Radiology CT ABDOMEN PELVIS WO CONTRAST  Result Date: 01/05/2022 CLINICAL DATA:  Acute abdominal pain EXAM: CT ABDOMEN AND PELVIS WITHOUT CONTRAST TECHNIQUE: Multidetector CT imaging of the abdomen and pelvis was performed following the standard protocol without IV contrast. Unenhanced CT was performed per clinician order. Lack of IV contrast limits sensitivity and specificity, especially for evaluation of abdominal/pelvic solid viscera. RADIATION DOSE REDUCTION: This exam was performed according to the departmental dose-optimization program which includes automated exposure control, adjustment of the mA and/or kV according to patient size and/or use of iterative reconstruction technique. COMPARISON:  12/30/2021 FINDINGS: Lower chest: No acute pleural or parenchymal lung disease. Hepatobiliary: High attenuation material within the gallbladder may reflect sludge or vicarious excretion of previously administered contrast. No calcified gallstones or evidence of cholecystitis. Unremarkable unenhanced appearance of the liver. Pancreas: Unremarkable unenhanced appearance. Spleen: Unremarkable  unenhanced appearance. Adrenals/Urinary Tract: There is bilateral renal cortical atrophy, with retention of previously administered contrast within the renal parenchyma, consistent with end-stage renal disease. Stable simple cyst upper pole right kidney. No urinary tract calculi or obstructive uropathy. High attenuation material within the bladder lumen consistent with excretion of previously administered contrast. The adrenals are stable. Stomach/Bowel: No bowel obstruction or ileus. Normal appendix right lower quadrant. No bowel wall thickening or inflammatory change. Vascular/Lymphatic: Stable fusiform infrarenal abdominal aortic aneurysm measuring up to 3.9 cm. Bilateral common iliac artery aneurysms, measuring 2 cm on the right and 2.3 cm on the left. Evaluation of the aortic lumen is limited without IV contrast. No evidence of aneurysm leak or rupture. Diffuse atherosclerosis is again noted. No pathologic adenopathy. Reproductive: Stable enlargement of the prostate. Other: No free fluid or free intraperitoneal gas. Stable fat containing right inguinal hernia. Musculoskeletal: No acute or destructive bony lesions. Reconstructed images demonstrate no additional findings. IMPRESSION: 1. No acute intra-abdominal or intrapelvic process. 2. 3.9 cm infrarenal abdominal aortic aneurysm. Recommend  follow-up every 2 years. Reference: J Am Coll Radiol 4403;47:[id number]. 3. Sequela of chronic renal insufficiency, with retention of contrast within the renal parenchyma, delayed excretion of contrast in the bladder, and vicarious excretion of contrast within the gallbladder. 4. Stable enlarged prostate. 5. Stable fat containing right inguinal hernia. 6.  Aortic Atherosclerosis (ICD10-I70.0). Electronically Signed   By: Randa Ngo M.D.   On: 01/05/2022 23:24   CT Head Wo Contrast  Result Date: 01/05/2022 CLINICAL DATA:  Head trauma. EXAM: CT HEAD WITHOUT CONTRAST TECHNIQUE: Contiguous axial images were obtained from the  base of the skull through the vertex without intravenous contrast. RADIATION DOSE REDUCTION: This exam was performed according to the departmental dose-optimization program which includes automated exposure control, adjustment of the mA and/or kV according to patient size and/or use of iterative reconstruction technique. COMPARISON:  Head CT dated 12/30/2021. FINDINGS: Brain: Mild age-related atrophy and chronic microvascular ischemic changes. There is no acute intracranial hemorrhage. No mass effect or midline shift. No extra-axial fluid collection. Vascular: No hyperdense vessel or unexpected calcification. Skull: Normal. Negative for fracture or focal lesion. Sinuses/Orbits: No acute finding. Other: None IMPRESSION: 1. No acute intracranial pathology. 2. Mild age-related atrophy and chronic microvascular ischemic changes. Electronically Signed   By: Anner Crete M.D.   On: 01/05/2022 23:24    Procedures Procedures    Medications Ordered in ED Medications  lactated ringers infusion ( Intravenous New Bag/Given 01/05/22 2245)  ondansetron (ZOFRAN) injection 4 mg (4 mg Intravenous Given 01/05/22 2245)    ED Course/ Medical Decision Making/ A&P                           Medical Decision Making Amount and/or Complexity of Data Reviewed Labs: ordered. Radiology: ordered.  Risk Prescription drug management.   Medical Decision Making / ED Course   This patient presents to the ED for concern of nausea, vomiting, this involves an extensive number of treatment options, and is a complaint that carries with it a high risk of complications and morbidity.  The differential diagnosis includes gastroenteritis, chronic illness, pancreatitis, ICH  MDM: 81 year old male with past medical history of CHF on hospice care presents today for evaluation of not feeling well.  Reports nausea vomiting and unable to keep any food or drink down.  Denies pain.  Does have abdominal tenderness on exam.  Will  evaluate with basic labs, UA, CT head given recent fall and subarachnoid hemorrhage, CT abdominal pelvis without contrast.  He does have renal failure with recent creatinine of 13.  He was advised dialysis but he refused.  He states if this worsens he is not willing to take dialysis.  Will provide Zofran. Discussed with son who states patient suddenly today requested to be sent to the emergency room because he was not feeling well.  I spoke with nursing line for [facility] state patient has had multiple visits for the symptoms and was instructed to take Zofran prior to meals. Patient remains without nausea and vomiting in the emergency room.  CT head shows no acute findings.  CT abdomen without acute intra-abdominal process.  Of note this was a noncontrast study.  CBC without leukocytosis, hemoglobin of 12 which is not significantly removed from his baseline.  UA without UTI.  CMP still pending.  Unfortunately patient's blood hemolyzed and will require redraw.  Patient at the end of my shift has been signed out to Dr. Roxanne Mins for follow-up on labs and disposition.  Notify patient that if his blood work is stable he will be discharged back home.  He is in agreement with this plan. Patient was offered Zofran however he states he has plenty of supply still left at home.   Lab Tests: -I ordered, reviewed, and interpreted labs.   The pertinent results include:   Labs Reviewed  CBC WITH DIFFERENTIAL/PLATELET - Abnormal; Notable for the following components:      Result Value   Hemoglobin 12.0 (*)    HCT 35.9 (*)    RDW 18.8 (*)    Platelets 147 (*)    All other components within normal limits  URINALYSIS, ROUTINE W REFLEX MICROSCOPIC - Abnormal; Notable for the following components:   APPearance HAZY (*)    All other components within normal limits  COMPREHENSIVE METABOLIC PANEL  LIPASE, BLOOD      EKG  EKG Interpretation  Date/Time:  Saturday January 05 2022 21:26:28 EDT Ventricular Rate:  50 PR  Interval:  349 QRS Duration: 174 QT Interval:  541 QTC Calculation: 494 R Axis:   246 Text Interpretation: Sinus rhythm Prolonged PR interval Right atrial enlargement Nonspecific IVCD with LAD Confirmed by Elnora Morrison [phone]) on 01/05/2022 10:37:19 PM         Imaging Studies ordered: I ordered imaging studies including CT abdomen pelvis with contra I independently visualized and interpreted imaging. I agree with the radiologist interpretation   Medicines ordered and prescription drug management: Meds ordered this encounter  Medications   ondansetron (ZOFRAN) injection 4 mg   lactated ringers infusion    -I have reviewed the patients home medicines and have made adjustments as needed  Cardiac Monitoring: The patient was maintained on a cardiac monitor.  I personally viewed and interpreted the cardiac monitored which showed an underlying rhythm of: Paced rhythm.  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  Remains without symptoms.  Patient signed out to Dr. Roxanne Mins for follow-up.  Final Clinical Impression(s) / ED Diagnoses Final diagnoses:  None    Rx / DC Orders ED Discharge Orders     None         Evlyn Courier, PA-C 01/06/22 0011    Elnora Morrison, MD 01/06/22 2354

## 2022-01-06 ENCOUNTER — Encounter (HOSPITAL_COMMUNITY): Payer: Self-pay | Admitting: Internal Medicine

## 2022-01-06 DIAGNOSIS — I255 Ischemic cardiomyopathy: Secondary | ICD-10-CM | POA: Diagnosis present

## 2022-01-06 DIAGNOSIS — D649 Anemia, unspecified: Secondary | ICD-10-CM | POA: Diagnosis not present

## 2022-01-06 DIAGNOSIS — Z79899 Other long term (current) drug therapy: Secondary | ICD-10-CM | POA: Diagnosis not present

## 2022-01-06 DIAGNOSIS — I132 Hypertensive heart and chronic kidney disease with heart failure and with stage 5 chronic kidney disease, or end stage renal disease: Secondary | ICD-10-CM | POA: Diagnosis not present

## 2022-01-06 DIAGNOSIS — E7800 Pure hypercholesterolemia, unspecified: Secondary | ICD-10-CM | POA: Diagnosis present

## 2022-01-06 DIAGNOSIS — E785 Hyperlipidemia, unspecified: Secondary | ICD-10-CM

## 2022-01-06 DIAGNOSIS — Z992 Dependence on renal dialysis: Secondary | ICD-10-CM | POA: Diagnosis not present

## 2022-01-06 DIAGNOSIS — Z7189 Other specified counseling: Secondary | ICD-10-CM | POA: Diagnosis not present

## 2022-01-06 DIAGNOSIS — R112 Nausea with vomiting, unspecified: Secondary | ICD-10-CM | POA: Diagnosis present

## 2022-01-06 DIAGNOSIS — Z95810 Presence of automatic (implantable) cardiac defibrillator: Secondary | ICD-10-CM | POA: Diagnosis not present

## 2022-01-06 DIAGNOSIS — I5022 Chronic systolic (congestive) heart failure: Secondary | ICD-10-CM | POA: Diagnosis present

## 2022-01-06 DIAGNOSIS — N186 End stage renal disease: Secondary | ICD-10-CM | POA: Diagnosis not present

## 2022-01-06 DIAGNOSIS — Z888 Allergy status to other drugs, medicaments and biological substances status: Secondary | ICD-10-CM | POA: Diagnosis not present

## 2022-01-06 DIAGNOSIS — E869 Volume depletion, unspecified: Secondary | ICD-10-CM | POA: Diagnosis not present

## 2022-01-06 DIAGNOSIS — I480 Paroxysmal atrial fibrillation: Secondary | ICD-10-CM | POA: Diagnosis not present

## 2022-01-06 DIAGNOSIS — Z711 Person with feared health complaint in whom no diagnosis is made: Secondary | ICD-10-CM

## 2022-01-06 DIAGNOSIS — U071 COVID-19: Secondary | ICD-10-CM | POA: Diagnosis not present

## 2022-01-06 DIAGNOSIS — Z880 Allergy status to penicillin: Secondary | ICD-10-CM | POA: Diagnosis not present

## 2022-01-06 DIAGNOSIS — J449 Chronic obstructive pulmonary disease, unspecified: Secondary | ICD-10-CM | POA: Diagnosis not present

## 2022-01-06 DIAGNOSIS — I482 Chronic atrial fibrillation, unspecified: Secondary | ICD-10-CM | POA: Diagnosis not present

## 2022-01-06 DIAGNOSIS — I2510 Atherosclerotic heart disease of native coronary artery without angina pectoris: Secondary | ICD-10-CM | POA: Diagnosis present

## 2022-01-06 DIAGNOSIS — Z515 Encounter for palliative care: Secondary | ICD-10-CM

## 2022-01-06 DIAGNOSIS — Z789 Other specified health status: Secondary | ICD-10-CM | POA: Diagnosis not present

## 2022-01-06 DIAGNOSIS — N185 Chronic kidney disease, stage 5: Secondary | ICD-10-CM | POA: Diagnosis not present

## 2022-01-06 DIAGNOSIS — I959 Hypotension, unspecified: Secondary | ICD-10-CM | POA: Diagnosis not present

## 2022-01-06 DIAGNOSIS — E875 Hyperkalemia: Secondary | ICD-10-CM | POA: Diagnosis present

## 2022-01-06 DIAGNOSIS — I420 Dilated cardiomyopathy: Secondary | ICD-10-CM | POA: Diagnosis not present

## 2022-01-06 DIAGNOSIS — I714 Abdominal aortic aneurysm, without rupture, unspecified: Secondary | ICD-10-CM | POA: Diagnosis not present

## 2022-01-06 DIAGNOSIS — I5040 Unspecified combined systolic (congestive) and diastolic (congestive) heart failure: Secondary | ICD-10-CM | POA: Diagnosis not present

## 2022-01-06 DIAGNOSIS — D638 Anemia in other chronic diseases classified elsewhere: Secondary | ICD-10-CM | POA: Diagnosis not present

## 2022-01-06 DIAGNOSIS — Z833 Family history of diabetes mellitus: Secondary | ICD-10-CM | POA: Diagnosis not present

## 2022-01-06 DIAGNOSIS — I4891 Unspecified atrial fibrillation: Secondary | ICD-10-CM | POA: Diagnosis not present

## 2022-01-06 DIAGNOSIS — F17290 Nicotine dependence, other tobacco product, uncomplicated: Secondary | ICD-10-CM | POA: Diagnosis present

## 2022-01-06 DIAGNOSIS — Z7901 Long term (current) use of anticoagulants: Secondary | ICD-10-CM | POA: Diagnosis not present

## 2022-01-06 DIAGNOSIS — N179 Acute kidney failure, unspecified: Secondary | ICD-10-CM | POA: Diagnosis present

## 2022-01-06 DIAGNOSIS — M109 Gout, unspecified: Secondary | ICD-10-CM | POA: Diagnosis present

## 2022-01-06 LAB — COMPREHENSIVE METABOLIC PANEL
ALT: 62 U/L — ABNORMAL HIGH (ref 0–44)
AST: 33 U/L (ref 15–41)
Albumin: 3.4 g/dL — ABNORMAL LOW (ref 3.5–5.0)
Alkaline Phosphatase: 56 U/L (ref 38–126)
Anion gap: 20 — ABNORMAL HIGH (ref 5–15)
BUN: 204 mg/dL — ABNORMAL HIGH (ref 8–23)
CO2: 21 mmol/L — ABNORMAL LOW (ref 22–32)
Calcium: 10.5 mg/dL — ABNORMAL HIGH (ref 8.9–10.3)
Chloride: 94 mmol/L — ABNORMAL LOW (ref 98–111)
Creatinine, Ser: 18.42 mg/dL — ABNORMAL HIGH (ref 0.61–1.24)
GFR, Estimated: 2 mL/min — ABNORMAL LOW (ref >60)
Glucose, Bld: 94 mg/dL (ref 70–99)
Potassium: 6.8 mmol/L (ref 3.5–5.1)
Sodium: 135 mmol/L (ref 135–145)
Total Bilirubin: 1.8 mg/dL — ABNORMAL HIGH (ref 0.3–1.2)
Total Protein: 6.9 g/dL (ref 6.5–8.1)

## 2022-01-06 LAB — CBC WITH DIFFERENTIAL/PLATELET
Abs Immature Granulocytes: 0.05 10*3/uL (ref 0.00–0.07)
Basophils Absolute: 0 10*3/uL (ref 0.0–0.1)
Basophils Relative: 0 %
Eosinophils Absolute: 0.1 10*3/uL (ref 0.0–0.5)
Eosinophils Relative: 1 %
HCT: 32.6 % — ABNORMAL LOW (ref 39.0–52.0)
Hemoglobin: 11.3 g/dL — ABNORMAL LOW (ref 13.0–17.0)
Immature Granulocytes: 1 %
Lymphocytes Relative: 13 %
Lymphs Abs: 1.1 10*3/uL (ref 0.7–4.0)
MCH: 27.4 pg (ref 26.0–34.0)
MCHC: 34.7 g/dL (ref 30.0–36.0)
MCV: 78.9 fL — ABNORMAL LOW (ref 80.0–100.0)
Monocytes Absolute: 1 10*3/uL (ref 0.1–1.0)
Monocytes Relative: 11 %
Neutro Abs: 6.6 10*3/uL (ref 1.7–7.7)
Neutrophils Relative %: 74 %
Platelets: 142 10*3/uL — ABNORMAL LOW (ref 150–400)
RBC: 4.13 MIL/uL — ABNORMAL LOW (ref 4.22–5.81)
RDW: 18.6 % — ABNORMAL HIGH (ref 11.5–15.5)
WBC: 8.9 10*3/uL (ref 4.0–10.5)
nRBC: 0 % (ref 0.0–0.2)

## 2022-01-06 LAB — HEPATITIS B CORE ANTIBODY, TOTAL: Hep B Core Total Ab: NONREACTIVE

## 2022-01-06 LAB — LIPASE, BLOOD: Lipase: 49 U/L (ref 11–51)

## 2022-01-06 LAB — HEPATITIS B SURFACE ANTIBODY,QUALITATIVE: Hep B S Ab: NONREACTIVE

## 2022-01-06 LAB — HEPATITIS C ANTIBODY: HCV Ab: NONREACTIVE

## 2022-01-06 LAB — MAGNESIUM: Magnesium: 2.2 mg/dL (ref 1.7–2.4)

## 2022-01-06 LAB — CBG MONITORING, ED
Glucose-Capillary: 93 mg/dL (ref 70–99)
Glucose-Capillary: 170 mg/dL — ABNORMAL HIGH (ref 70–99)

## 2022-01-06 LAB — HEPATITIS B SURFACE ANTIGEN: Hepatitis B Surface Ag: NONREACTIVE

## 2022-01-06 MED ORDER — SODIUM ZIRCONIUM CYCLOSILICATE 10 G PO PACK
10.0000 g | PACK | Freq: Once | ORAL | Status: AC
  Administered 2022-01-06: 10 g via ORAL
  Filled 2022-01-06: qty 1

## 2022-01-06 MED ORDER — FERRIC CITRATE 1 GM 210 MG(FE) PO TABS
210.0000 mg | ORAL_TABLET | Freq: Three times a day (TID) | ORAL | Status: DC
  Administered 2022-01-06 – 2022-01-07 (×3): 210 mg via ORAL
  Filled 2022-01-06 (×4): qty 1

## 2022-01-06 MED ORDER — APIXABAN 2.5 MG PO TABS
2.5000 mg | ORAL_TABLET | Freq: Two times a day (BID) | ORAL | Status: DC
  Administered 2022-01-06 – 2022-01-07 (×3): 2.5 mg via ORAL
  Filled 2022-01-06 (×3): qty 1

## 2022-01-06 MED ORDER — SENNA 8.6 MG PO TABS
2.0000 | ORAL_TABLET | Freq: Every day | ORAL | Status: DC | PRN
Start: 2022-01-06 — End: 2022-01-07

## 2022-01-06 MED ORDER — CHLORHEXIDINE GLUCONATE CLOTH 2 % EX PADS: 6.0000 | MEDICATED_PAD | Freq: Every day | CUTANEOUS | Status: DC

## 2022-01-06 MED ORDER — CALCIUM GLUCONATE-NACL 1-0.675 GM/50ML-% IV SOLN
1.0000 g | Freq: Once | INTRAVENOUS | Status: AC
  Administered 2022-01-06: 1000 mg via INTRAVENOUS
  Filled 2022-01-06: qty 50

## 2022-01-06 MED ORDER — ONDANSETRON HCL 4 MG/2ML IJ SOLN
4.0000 mg | Freq: Four times a day (QID) | INTRAMUSCULAR | Status: DC | PRN
  Filled 2022-01-06: qty 2

## 2022-01-06 MED ORDER — MIDODRINE HCL 5 MG PO TABS
10.0000 mg | ORAL_TABLET | Freq: Once | ORAL | Status: AC
Start: 2022-01-06 — End: 2022-01-06
  Administered 2022-01-06: 10 mg via ORAL
  Filled 2022-01-06: qty 2

## 2022-01-06 MED ORDER — INSULIN ASPART 100 UNIT/ML IV SOLN
5.0000 [IU] | Freq: Once | INTRAVENOUS | Status: AC
  Administered 2022-01-06: 5 [IU] via INTRAVENOUS

## 2022-01-06 MED ORDER — ONDANSETRON HCL 4 MG PO TABS: 4.0000 mg | ORAL_TABLET | Freq: Three times a day (TID) | ORAL | Status: DC | PRN

## 2022-01-06 MED ORDER — ALBUTEROL SULFATE (2.5 MG/3ML) 0.083% IN NEBU
10.0000 mg | INHALATION_SOLUTION | Freq: Once | RESPIRATORY_TRACT | Status: AC
Start: 2022-01-06 — End: 2022-01-06
  Administered 2022-01-06: 10 mg via RESPIRATORY_TRACT
  Filled 2022-01-06: qty 12

## 2022-01-06 MED ORDER — SODIUM BICARBONATE 8.4 % IV SOLN
50.0000 meq | Freq: Once | INTRAVENOUS | Status: AC
  Administered 2022-01-06: 50 meq via INTRAVENOUS
  Filled 2022-01-06: qty 50

## 2022-01-06 MED ORDER — ACETAMINOPHEN 325 MG PO TABS
650.0000 mg | ORAL_TABLET | Freq: Four times a day (QID) | ORAL | Status: DC | PRN
  Filled 2022-01-06: qty 2

## 2022-01-06 MED ORDER — MIDODRINE HCL 5 MG PO TABS
10.0000 mg | ORAL_TABLET | Freq: Two times a day (BID) | ORAL | Status: DC
  Administered 2022-01-07: 10 mg via ORAL
  Filled 2022-01-06: qty 2

## 2022-01-06 MED ORDER — ATORVASTATIN CALCIUM 80 MG PO TABS
80.0000 mg | ORAL_TABLET | Freq: Every day | ORAL | Status: DC
  Administered 2022-01-06 – 2022-01-07 (×2): 80 mg via ORAL
  Filled 2022-01-06: qty 1
  Filled 2022-01-06: qty 2

## 2022-01-06 MED ORDER — ACETAMINOPHEN 650 MG RE SUPP: 650.0000 mg | Freq: Four times a day (QID) | RECTAL | Status: DC | PRN

## 2022-01-06 MED ORDER — CHLORHEXIDINE GLUCONATE CLOTH 2 % EX PADS
6.0000 | MEDICATED_PAD | Freq: Every day | CUTANEOUS | Status: DC
  Administered 2022-01-06 – 2022-01-07 (×2): 6 via TOPICAL

## 2022-01-06 MED ORDER — AMIODARONE HCL 200 MG PO TABS
200.0000 mg | ORAL_TABLET | Freq: Two times a day (BID) | ORAL | Status: DC
  Administered 2022-01-06 – 2022-01-07 (×2): 200 mg via ORAL
  Filled 2022-01-06 (×2): qty 1

## 2022-01-06 MED ORDER — DOCUSATE SODIUM 100 MG PO CAPS
100.0000 mg | ORAL_CAPSULE | Freq: Every day | ORAL | Status: DC | PRN
Start: 2022-01-06 — End: 2022-01-07

## 2022-01-06 MED ORDER — HEPARIN SODIUM (PORCINE) 1000 UNIT/ML DIALYSIS
1500.0000 [IU] | INTRAMUSCULAR | Status: DC | PRN
Start: 2022-01-07 — End: 2022-01-07
  Filled 2022-01-06: qty 2

## 2022-01-06 MED ORDER — DEXTROSE 50 % IV SOLN
1.0000 | Freq: Once | INTRAVENOUS | Status: AC
  Administered 2022-01-06: 50 mL via INTRAVENOUS
  Filled 2022-01-06: qty 50

## 2022-01-06 NOTE — Consult Note (Cosign Needed Addendum)
Consultation Note Date: 01/06/2022   Patient Name: Anthony Foster  DOB: November 23, 1940  MRN: [id number]  Age / Sex: 81 y.o., male  PCP: Anthony Low, MD Referring Physician: Damita Lack, MD  Reason for Consultation: Establishing goals of care, "assistance with clarification of goals of care as well as reconciliation of hospice status with the patient's desire to remain full code"  HPI/Patient Profile: 81 y.o. 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 Patient has been enrolled in hospice since March 2023 with a terminal diagnosis of hypertensive heart disease and chronic kidney disease for where he has been adamant about not pursuing HD. After Nephrology consult, patient is now agreeable to a trial of HD. Patient was admitted on 01/05/2022 for urgent hemodialysis with hyperkalemia, CKD V now ESRD, nausea/vomiting, COVID 19.     Clinical Assessment and Goals of Care: I have reviewed medical records including EPIC notes, labs, and imaging. Noted patient underwent HD today and did not tolerate well - see HD RN and RR RN notes - session was truncated after 1 hour and 53 minutes. Received report from primary RN - no acute concerns.   Went to visit patient at bedside - no family/visitors present. Patient was lying in bed awake, alert, oriented, and able to participate in conversation. No signs or non-verbal gestures of pain or discomfort noted. No respiratory distress, increased work of breathing, or secretions noted. Patient denies pain, shortness of breath, nausea. He is on 2L O2 [place].  Met with patient  to discuss diagnosis, prognosis, GOC, EOL wishes, disposition, and  options.  I introduced Palliative Medicine as specialized medical care for people living with serious illness. It focuses on providing relief from the symptoms and stress of a serious illness. The goal is to improve quality of life for both the patient and the family.  We discussed a brief life review of the patient as well as functional and nutritional status. 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 He is not married - has one daughter and two sons. Prior to hospitalization, patient was living in a private residence with his grandson and great-granddaughter. Patient reports being bedbound. He has caregivers that see him during the weekdays for assistance with ADLs.  Patient's appetite has been poor over the last several weeks.   We discussed patient's current illness and what it means in the larger context of patient's on-going co-morbidities. Patient understands his heart is very weak. We discussed the difficulty of pursuing high risk procedures, such as HD, with a weak heart. We touched on the psychological aspects of end stage illness and desire to prolong life. Natural disease trajectory and expectations at EOL were discussed. I attempted to elicit values and goals of care important to the patient. The difference between aggressive medical intervention and comfort care was considered in light of the patient's goals of care. When it comes to decision making, patient is very defensive. He is clear that he does not want to make any further decisions regarding his care - he wishes to defer all decisions to his grandson/Anthony Foster. Patient does not have a HCPOA. I offered to call his grandson with his phone so PMT, him, and Anthony Foster could speak together - patient did not seem ok with using his phone but was ok for me  to call Anthony Foster outside the room. I asked the patient, if Anthony Foster felt not pursuing any further HD was in his best interest, would he be ok with this - patient stated "yes." Patient is also open to attempting dialysis  again tomorrow if Anthony Foster felt this was best.   Encouraged patient to consider DNR/DNI status understanding evidenced based poor outcomes in similar hospitalized patient, as the cause of arrest is likely associated with advanced chronic/terminal illness rather than an easily reversible acute cardio-pulmonary event. I explained that DNR/DNI does not change the medical plan and it only comes into effect after a person has arrested (died).  It is a protective measure to keep [place] from harming the patient in their last moments of life. Patient again became agitated and defensive and expresses he would want Anthony Foster to make this decision. I asked him, if Anthony Foster felt DNR/DNI was in his best interest, would he be ok with this - patient stated "yes."   Encouraged patient to express his own thoughts/feelings around treatment option decisions - he is not interested in making decisions and again defers all decisions to his grandson/Anthony Foster.   Called grandson/Anthony Foster - emotional support provided. Provided updates on conversation as outlined above. Anthony Foster expressed understanding of two primary decisions patient is asking of him: 1. Attempt another HD session vs not; 2. Code status. He understands patient is hopeful he will make decisions on his behalf. Reviewed options of pursuing HD vs not, along with anticipated outcomes. Anthony Foster understands if they decide not to pursue further HD, or if HD is not tolerated, hospice is still recommended. Discussed options of returning home with hospice vs residential hospice. Anthony Foster feels patient would want to return home with hospice and family feel comfortable continuing to care for him at EOL. Anthony Foster requests time to process information given to him today. He will call PMT with any final decisions/questions. Encouraged Anthony Foster to have final decisions prior to HD session tomorrow and he agreed.   Visit also consisted of discussions dealing with the complex and emotionally intense issues of symptom  management and palliative care in the setting of serious and potentially life-threatening illness. Palliative care team will continue to support patient, patient's family, and medical team.  Discussed with patient/family the importance of continued conversation with each other and the medical providers regarding overall plan of care and treatment options, ensuring decisions are within the context of the patient's values and GOCs.    Questions and concerns were addressed. The patient/family was encouraged to call with questions and/or concerns. PMT number was provided.   Primary Decision Maker: PATIENT; however, he is deferral all decisions to his grandson/Anthony Foster Leber    SUMMARY OF RECOMMENDATIONS   Continue full code/full scope for now. Patient has deferred decision making to grandson/Anthony Foster - patient states if Anthony Foster feels HD and full code are not in his best interest, he is open to no further HD treatments and for DNR/DNI Anthony Foster requested time to process information from today - he will call PMT tonight with any final decisions. If he is not in contact with PMT tonight, please ensure to call him tomorrow 7/17 prior to patient's HD session for final decisions. PMT provider may not be available prior to 10a If decided not to pursue further HD, or if HD is not tolerated/offered, goal is for patient to return home with hospice - [facility] following for needs while inpatient PMT will continue to follow and support holistically   Code Status/Advance Care Planning: Full code  Palliative Prophylaxis:  Aspiration, Bowel Regimen, Delirium Protocol, Frequent Pain Assessment, Oral Care, and Turn Reposition  Additional Recommendations (Limitations, Scope, Preferences): Full Scope Treatment  Psycho-social/Spiritual:  Desire for further Chaplaincy support:no Created space and opportunity for patient and family to express thoughts and feelings regarding patient's current medical situation.   Emotional support and therapeutic listening provided.  Prognosis:  < 2 weeks  Discharge Planning: To Be Determined      Primary Diagnoses: Present on Admission:  Hyperkalemia  Acute renal failure superimposed on stage 5 chronic kidney disease, not on chronic dialysis (HCC)  Nausea & vomiting  Incidental finding of COVID-19 virus infection  Hyperlipidemia  PAF (paroxysmal atrial fibrillation) (HCC) on chronic anticoagulation  Chronic systolic CHF (congestive heart failure) ([place])  AKI (acute kidney injury) ([place])   I have reviewed the medical record, interviewed the patient and family, and examined the patient. The following aspects are pertinent.  Past Medical History:  Diagnosis Date   AAA (abdominal aortic aneurysm) (HCC)    3.5 cm 04/2018   Atrial fibrillation (HCC)    CHF (congestive heart failure) (HCC)    Coronary artery disease    Prior stent   Degenerative joint disease    Full dentures    Gout    Hypercholesteremia    Hypertension    ICD (implantable cardioverter-defibrillator) battery depletion    Ischemic cardiomyopathy    Pneumonia    Renal insufficiency    Wears glasses    Social History   Socioeconomic History   Marital status: Divorced    Spouse name: Not on file   Number of children: 4   Years of education: Not on file   Highest education level: Not on file  Occupational History   Not on file  Tobacco Use   Smoking status: Some Days    Types: Cigars   Smokeless tobacco: Never  Vaping Use   Vaping Use: Never used  Substance and Sexual Activity   Alcohol use: Not Currently    Alcohol/week: 0.0 standard drinks of alcohol   Drug use: Never   Sexual activity: Not on file  Other Topics Concern   Not on file  Social History Narrative   Not on file   Social Determinants of Health   Financial Resource Strain: Not on file  Food Insecurity: Not on file  Transportation Needs: Not on file  Physical Activity: Not on file  Stress: Not on file   Social Connections: Not on file   Family History  Problem Relation Age of Onset   Heart disease Other        No family history   Diabetes Sister    Scheduled Meds:  amiodarone  200 mg Oral BID   apixaban  2.5 mg Oral BID   atorvastatin  80 mg Oral Daily   Chlorhexidine Gluconate Cloth  6 each Topical Q0600   Chlorhexidine Gluconate Cloth  6 each Topical Q0600   ferric citrate  210 mg Oral TID WC   midodrine  10 mg Oral BID WC   Continuous Infusions: PRN Meds:.acetaminophen **OR** acetaminophen, docusate sodium, [START ON 01/07/2022] heparin, ondansetron (ZOFRAN) IV, ondansetron, senna Medications Prior to Admission:  Prior to Admission medications   Medication Sig Start Date End Date Taking? Authorizing Provider  allopurinol (ZYLOPRIM) 300 MG tablet Take 300 mg by mouth daily. 07/26/16  Yes [provider]  amiodarone (PACERONE) 200 MG tablet Take 1 tablet (200 mg total) by mouth 2 (two) times daily. 09/07/21  Yes Domenic Polite,  MD  apixaban (Anthony Foster) 2.5 MG TABS tablet Take 1 tablet (2.5 mg total) by mouth daily. 06/10/19  Yes Lelon Perla, MD  docusate sodium (COLACE) 100 MG capsule Take 100 mg by mouth daily as needed for mild constipation. 12/05/21  Yes [provider]  EPINEPHrine 0.3 mg/0.3 mL IJ SOAJ injection SMARTSIG:1 pre-filled pen syringe IM Once 11/14/21  Yes [provider]  midodrine (PROAMATINE) 10 MG tablet Take 1 tablet (10 mg total) by mouth 2 (two) times daily with a meal. 09/07/21  Yes Domenic Polite, MD  nitroGLYCERIN (NITROSTAT) 0.4 MG SL tablet Place 1 tablet (0.4 mg total) under the tongue every 5 (five) minutes as needed for chest pain. 01/14/18 01/06/22 Yes Kilroy, Luke K, PA-C  ondansetron (ZOFRAN) 4 MG tablet Take 4 mg by mouth 3 (three) times daily as needed for nausea/vomiting. 11/14/21  Yes [provider]  PROAIR RESPICLICK 017 (90 Base) MCG/ACT AEPB Inhale 1 puff into the lungs every 4 (four) hours as needed  (shortness of breath). 08/02/21  Yes [provider]  senna (SENOKOT) 8.6 MG TABS tablet Take 2 tablets by mouth daily as needed for mild constipation. 12/05/21  Yes [provider]  Torsemide 40 MG TABS Take 40 mg by mouth daily. 09/08/21  Yes Domenic Polite, MD  atorvastatin (LIPITOR) 80 MG tablet Take 1 tablet (80 mg total) by mouth daily. Patient not taking: Reported on 01/06/2022 09/12/15   Lelon Perla, MD   Allergies  Allergen Reactions   Hymenoptera Venom Preparations Hives    Reaction to wasps   Lisinopril Other (See Comments)    angioedema   Piroxicam Other (See Comments)    burn   Shellfish Allergy Swelling   Amoxicillin-Pot Clavulanate Rash    Did it involve swelling of the face/tongue/throat, SOB, or Foster BP? No Did it involve sudden or severe rash/hives, skin peeling, or any reaction on the inside of your mouth or nose? Yes Did you need to seek medical attention at a hospital or doctor's office? Yes When did it last happen?      over 10 years If all above answers are "NO", may proceed with cephalosporin use.    Review of Systems  Constitutional:  Positive for activity change, appetite change and fatigue.  Respiratory:  Negative for shortness of breath.   Gastrointestinal:  Negative for nausea and vomiting.  Neurological:  Positive for weakness.  All other systems reviewed and are negative.   Physical Exam Vitals and nursing note reviewed.  Constitutional:      General: He is not in acute distress.    Appearance: He is ill-appearing.  Pulmonary:     Effort: No respiratory distress.  Skin:    General: Skin is warm and dry.  Neurological:     Mental Status: He is alert and oriented to person, place, and time.     Motor: Weakness present.  Psychiatric:        Attention and Perception: Attention normal.        Behavior: Behavior is agitated. Behavior is cooperative.        Cognition and Memory: Cognition and memory normal.     Vital Signs:  BP (!) 95/38 (BP Location: Left Arm)   Pulse 74   Temp (!) 97.4 F (36.3 C) (Oral)   Resp 16   Wt 61.7 kg   SpO2 100%   BMI 20.68 kg/m  Pain Scale: 0-10   Pain Score: 0-No pain   SpO2: SpO2: 100 %  O2 Device:SpO2: 100 % O2 Flow Rate: .O2 Flow Rate (L/min): 2 L/min  IO: Intake/output summary:  Intake/Output Summary (Last 24 hours) at 01/06/2022 1558 Last data filed at 01/06/2022 1339 Gross per 24 hour  Intake --  Output 300 ml  Net -300 ml    LBM:   Baseline Weight: Weight: 61.4 kg Most recent weight: Weight: 61.7 kg     Palliative Assessment/Data: PPS 20-30%     Time In: 1520 Time Out: 1700 Time Total: 100 minutes  Greater than 50%  of this time was spent counseling and coordinating care related to the above assessment and plan.  Signed by: Lin Landsman, NP   Please contact Palliative Medicine Team phone at [phone] for questions and concerns.  For individual provider: See Amion  *Portions of this note are a verbal dictation therefore any spelling and/or grammatical errors are due to the "[employer]" system interpretation.

## 2022-01-06 NOTE — Progress Notes (Signed)
[facility] ED [facility])   Hospice hospital liaison note     This patient is a current hospice patient with [facility], admitted March 2023 with a terminal diagnosis of Hypertensive Heart and Chronic Kidney Disease with heart failure and with stage 5 kidney disease.     [facility] will continue to follow for any discharge planning needs and to coordinate continuation of hospice care.   Please don't hesitate to call with any Hospice related questions or concerns.    Thank you for the opportunity to participate in this patient's care. [name], Therapist, sports, BSN, [facility] Liaison [phone]

## 2022-01-06 NOTE — Progress Notes (Signed)
PROGRESS NOTE    Anthony Foster  YWV:[id number] DOB: 23-Oct-1940 DOA: 01/05/2022 PCP: Wenda Low, MD   Brief Narrative:  81 year old with history of CKD stage V, paroxysmal A-fib on Eliquis, systolic CHF status post ICD placement admitted to the hospital for hyperkalemia, nausea and vomiting.  Upon admission patient noted to be hyperkalemic with a potassium of 6.8, creatinine 18.4, BUN 204.  EKG did not show any peaked T waves.  CT abdomen pelvis was negative for acute pathology, nephrology team was consulted.  Apparently patient has also been on hospice care service and wishes to be full code and wants to pursue dialysis.   Assessment & Plan:  Principal Problem:   Hyperkalemia Active Problems:   Acute renal failure superimposed on stage 5 chronic kidney disease, not on chronic dialysis (HCC)   PAF (paroxysmal atrial fibrillation) (HCC) on chronic anticoagulation   Hyperlipidemia   Nausea & vomiting   Incidental finding of COVID-19 virus infection   Chronic systolic CHF (congestive heart failure) (HCC)     Hyperkalemia - Potassium 6.8 without acute EKG changes.  Nephrology team consulted for dialysis.  Patient did receive routine hyperkalemia treatment  CKD stage V now ESRD. - Baseline creatinine 5, admission creatinine 18.  HD plans per nephrology.  Palliative care consulted as patient is on hospice.  Hospice team aware.  He does have rapid upper extremity AV fistula which is mature.  Nausea vomiting - Suspect from uremia.  CT abdomen pelvis does not show any acute pathology.  Also had incidental finding of COVID-19 about a week ago.  COVID-19 infection - Incidental finding on 7/9 as outpatient.  Supportive measures at this time.  Anemia of chronic disease -Hemoglobin is stable currently around 11.0.  Continue to monitor  Chronic atrial fibrillation -Patient is on amiodarone and Eliquis twice daily.  Congestive heart failure with reduced ejection fraction,  20% -Home torsemide is currently on hold.  Echocardiogram in March 2023 showed EF 20%, severe LV dysfunction and hypokinesia.  Dilated cardiomyopathy.  Patient has been on midodrine due to concerns of hypoxemia, not on any antihypertensive such as Coreg.  Given his comorbidities he overall has poor prognosis but at this time patient wishes to pursue dialysis per renal team.  I have been concerned about how he will tolerate hemodialysis in the setting of severe CHF   DVT prophylaxis: Eliquis Code Status: Full code Family Communication: Spoke with patient's grandson [name] over the phone.  Maintain hospital stay for acute renal failure progressing to ESRD now requiring new dialysis.   Subjective: When I saw the patient this morning with ER nurse, Luiz Iron, at bedside with me.  Patient is alert awake oriented X3.  He tells me he does not want long-term dialysis.  Apparently he had told nephrology team this morning that he does not want to pursue this.  Palliative care team and his hospice team will also follow-up with him.  I also discussed his case with his grandson [name] at his request.   Examination:  G constitutional: Not in acute distress but appears chronically ill.  Cachectic frail Respiratory: Bibasilar crackles Cardiovascular: Normal sinus rhythm, no rubs Abdomen: Nontender nondistended good bowel sounds Musculoskeletal: No edema noted Skin: No rashes seen Neurologic: CN 2-12 grossly intact.  And nonfocal Psychiatric: Normal judgment and insight. Alert and oriented x 3. Normal mood.  Objective: Vitals:   01/06/22 0715 01/06/22 0745 01/06/22 0747 01/06/22 0800  BP:   104/86 (!) 102/37  Pulse: 62 61 61 63  Resp: 20 (!) 21 (!) 21 (!) 26  Temp:   97.6 F (36.4 C)   TempSrc:   Oral   SpO2: 98% 94% 94% 100%   No intake or output data in the 24 hours ending 01/06/22 0921 There were no vitals filed for this visit.   Data Reviewed:   CBC: Recent Labs  Lab 12/30/21 1127  12/30/21 1147 01/05/22 2248 01/06/22 0400  WBC 10.6*  --  9.7 8.9  NEUTROABS  --   --  7.7 6.6  HGB 13.6 15.6 12.0* 11.3*  HCT 40.8 46.0 35.9* 32.6*  MCV 81.4  --  81.8 78.9*  PLT 169  --  147* 761*   Basic Metabolic Panel: Recent Labs  Lab 12/30/21 1127 12/30/21 1147 01/05/22 2355 01/06/22 0400  NA 135 135 135 136  K 4.4 4.4 6.8* 6.0*  CL 93* 100 94* 94*  CO2 20*  --  21* 17*  GLUCOSE 147* 140* 94 133*  BUN 170* >130* 204* 209*  CREATININE 11.73* 13.00* 18.42* 18.08*  CALCIUM 11.1*  --  10.5* 10.4*  MG  --   --   --  2.2  PHOS  --   --   --  7.3*   GFR: Estimated Creatinine Clearance: 2.8 mL/min (A) (by C-G formula based on SCr of 18.08 mg/dL (H)). Liver Function Tests: Recent Labs  Lab 12/30/21 1127 01/05/22 2355 01/06/22 0400  AST 44* 33  --   ALT 71* 62*  --   ALKPHOS 61 56  --   BILITOT 1.4* 1.8*  --   PROT 7.5 6.9  --   ALBUMIN 3.8 3.4* 3.1*   Recent Labs  Lab 01/05/22 2355  LIPASE 49   No results for input(s): "AMMONIA" in the last 168 hours. Coagulation Profile: Recent Labs  Lab 12/30/21 1127  INR 1.5*   Cardiac Enzymes: No results for input(s): "CKTOTAL", "CKMB", "CKMBINDEX", "TROPONINI" in the last 168 hours. BNP (last 3 results) No results for input(s): "PROBNP" in the last 8760 hours. HbA1C: No results for input(s): "HGBA1C" in the last 72 hours. CBG: Recent Labs  Lab 01/06/22 0214 01/06/22 0318  GLUCAP 93 170*   Lipid Profile: No results for input(s): "CHOL", "HDL", "LDLCALC", "TRIG", "CHOLHDL", "LDLDIRECT" in the last 72 hours. Thyroid Function Tests: No results for input(s): "TSH", "T4TOTAL", "FREET4", "T3FREE", "THYROIDAB" in the last 72 hours. Anemia Panel: No results for input(s): "VITAMINB12", "FOLATE", "FERRITIN", "TIBC", "IRON", "RETICCTPCT" in the last 72 hours. Sepsis Labs: Recent Labs  Lab 12/30/21 1127  LATICACIDVEN 2.8*    Recent Results (from the past 240 hour(s))  Resp Panel by RT-PCR (Flu A&B, Covid)  Anterior Nasal Swab     Status: Abnormal   Collection Time: 12/30/21 12:05 PM   Specimen: Anterior Nasal Swab  Result Value Ref Range Status   SARS Coronavirus 2 by RT PCR POSITIVE (A) NEGATIVE Final    Comment: (NOTE) SARS-CoV-2 target nucleic acids are DETECTED.  The SARS-CoV-2 RNA is generally detectable in upper respiratory specimens during the acute phase of infection. Positive results are indicative of the presence of the identified virus, but do not rule out bacterial infection or co-infection with other pathogens not detected by the test. Clinical correlation with patient history and other diagnostic information is necessary to determine patient infection status. The expected result is Negative.  Fact Sheet for Patients: EntrepreneurPulse.com.au  Fact Sheet for Healthcare Providers: IncredibleEmployment.be  This test is not yet approved or cleared by the [place] and  has been authorized for detection and/or diagnosis of SARS-CoV-2 by FDA under an Emergency Use Authorization (EUA).  This EUA will remain in effect (meaning this test can be used) for the duration of  the COVID-19 declaration under Section 564(b)(1) of the A ct, 21 U.S.C. section 360bbb-3(b)(1), unless the authorization is terminated or revoked sooner.     Influenza A by PCR NEGATIVE NEGATIVE Final   Influenza B by PCR NEGATIVE NEGATIVE Final    Comment: (NOTE) The Xpert Xpress SARS-CoV-2/FLU/RSV plus assay is intended as an aid in the diagnosis of influenza from Nasopharyngeal swab specimens and should not be used as a sole basis for treatment. Nasal washings and aspirates are unacceptable for Xpert Xpress SARS-CoV-2/FLU/RSV testing.  Fact Sheet for Patients: EntrepreneurPulse.com.au  Fact Sheet for Healthcare Providers: IncredibleEmployment.be  This test is not yet approved or cleared by the [place] FDA and has  been authorized for detection and/or diagnosis of SARS-CoV-2 by FDA under an Emergency Use Authorization (EUA). This EUA will remain in effect (meaning this test can be used) for the duration of the COVID-19 declaration under Section 564(b)(1) of the Act, 21 U.S.C. section 360bbb-3(b)(1), unless the authorization is terminated or revoked.  Performed at [facility], [place] [address], [place],  [address]          Radiology Studies: CT ABDOMEN PELVIS WO CONTRAST  Result Date: 01/05/2022 CLINICAL DATA:  Acute abdominal pain EXAM: CT ABDOMEN AND PELVIS WITHOUT CONTRAST TECHNIQUE: Multidetector CT imaging of the abdomen and pelvis was performed following the standard protocol without IV contrast. Unenhanced CT was performed per clinician order. Lack of IV contrast limits sensitivity and specificity, especially for evaluation of abdominal/pelvic solid viscera. RADIATION DOSE REDUCTION: This exam was performed according to the departmental dose-optimization program which includes automated exposure control, adjustment of the mA and/or kV according to patient size and/or use of iterative reconstruction technique. COMPARISON:  12/30/2021 FINDINGS: Lower chest: No acute pleural or parenchymal lung disease. Hepatobiliary: High attenuation material within the gallbladder may reflect sludge or vicarious excretion of previously administered contrast. No calcified gallstones or evidence of cholecystitis. Unremarkable unenhanced appearance of the liver. Pancreas: Unremarkable unenhanced appearance. Spleen: Unremarkable unenhanced appearance. Adrenals/Urinary Tract: There is bilateral renal cortical atrophy, with retention of previously administered contrast within the renal parenchyma, consistent with end-stage renal disease. Stable simple cyst upper pole right kidney. No urinary tract calculi or obstructive uropathy. High attenuation material within the bladder lumen consistent with excretion of  previously administered contrast. The adrenals are stable. Stomach/Bowel: No bowel obstruction or ileus. Normal appendix right lower quadrant. No bowel wall thickening or inflammatory change. Vascular/Lymphatic: Stable fusiform infrarenal abdominal aortic aneurysm measuring up to 3.9 cm. Bilateral common iliac artery aneurysms, measuring 2 cm on the right and 2.3 cm on the left. Evaluation of the aortic lumen is limited without IV contrast. No evidence of aneurysm leak or rupture. Diffuse atherosclerosis is again noted. No pathologic adenopathy. Reproductive: Stable enlargement of the prostate. Other: No free fluid or free intraperitoneal gas. Stable fat containing right inguinal hernia. Musculoskeletal: No acute or destructive bony lesions. Reconstructed images demonstrate no additional findings. IMPRESSION: 1. No acute intra-abdominal or intrapelvic process. 2. 3.9 cm infrarenal abdominal aortic aneurysm. Recommend follow-up every 2 years. Reference: J Am Coll Radiol 3532;99:242-683. 3. Sequela of chronic renal insufficiency, with retention of contrast within the renal parenchyma, delayed excretion of contrast in the bladder, and vicarious excretion of contrast within the gallbladder. 4. Stable enlarged prostate. 5. Stable  fat containing right inguinal hernia. 6.  Aortic Atherosclerosis (ICD10-I70.0). Electronically Signed   By: Randa Ngo M.D.   On: 01/05/2022 23:24   CT Head Wo Contrast  Result Date: 01/05/2022 CLINICAL DATA:  Head trauma. EXAM: CT HEAD WITHOUT CONTRAST TECHNIQUE: Contiguous axial images were obtained from the base of the skull through the vertex without intravenous contrast. RADIATION DOSE REDUCTION: This exam was performed according to the departmental dose-optimization program which includes automated exposure control, adjustment of the mA and/or kV according to patient size and/or use of iterative reconstruction technique. COMPARISON:  Head CT dated 12/30/2021. FINDINGS: Brain: Mild  age-related atrophy and chronic microvascular ischemic changes. There is no acute intracranial hemorrhage. No mass effect or midline shift. No extra-axial fluid collection. Vascular: No hyperdense vessel or unexpected calcification. Skull: Normal. Negative for fracture or focal lesion. Sinuses/Orbits: No acute finding. Other: None IMPRESSION: 1. No acute intracranial pathology. 2. Mild age-related atrophy and chronic microvascular ischemic changes. Electronically Signed   By: Anner Crete M.D.   On: 01/05/2022 23:24        Scheduled Meds:  amiodarone  200 mg Oral BID   apixaban  2.5 mg Oral BID   atorvastatin  80 mg Oral Daily   Chlorhexidine Gluconate Cloth  6 each Topical Q0600   Chlorhexidine Gluconate Cloth  6 each Topical Q0600   ferric citrate  210 mg Oral TID WC   Continuous Infusions:   LOS: 0 days   Time spent= 35 mins    Brianna Esson Arsenio Loader, MD [facility]  If 7PM-7AM, please contact night-coverage  01/06/2022, 9:21 AM

## 2022-01-06 NOTE — Procedures (Signed)
   I was present at this dialysis session, have reviewed the session itself and made  appropriate changes Kelly Splinter MD [place] pager [phone]   01/06/2022, 12:14 PM

## 2022-01-06 NOTE — H&P (Signed)
History and Physical    PLEASE NOTE THAT DRAGON DICTATION SOFTWARE WAS USED IN THE CONSTRUCTION OF THIS NOTE.   Anthony Foster MGQ:[id number] DOB: 16-Mar-1941 DOA: 01/05/2022  PCP: Wenda Low, MD  Patient coming from: home   I have personally briefly reviewed patient's old medical records in [place]  Chief Complaint: Nausea vomiting  HPI: Anthony Foster is a 81 y.o. male with medical history significant for stage V CKD with baseline creatinine of approximately 4-5, but not on hemodialysis, paroxysmal atrial fibrillation chronically anticoagulated on Eliquis, chronic systolic heart failure status post ICD placement, who is admitted to [facility] on 01/05/2022 with hyperkalemia after presenting from home to [facility] ED complaining of nausea/vomiting.   The patient reports approximately 10 days of progressive, recurrent nausea resulting in at least 3-4 daily episodes of nonbloody, nonbilious emesis, with most recent episode of vomiting occurring just prior to presenting to [facility] emergency department this evening.  Has completed outpatient work-up for the patient's recent nausea/vomiting, he was found to be COVID-19 positive on 12/30/2021. no associated any subjective fever, chills, rigors, or generalized myalgias.  Denies any associated rhinitis, rhinorrhea, sore throat, cough, shortness of breath, diarrhea.   He has a history of stage V CKD, with baseline creatinine of 4-5.  Underwent fistula to the right upper extremity in June 2020, is never subsequently required hemodialysis.  Per chart review, recent trend in serum creatinine notable for the following: Creatinine in June 2020: 3.35; 08/24/2021: 3.90; 09/05/2021: 5.09; 12/30/2021: 11.73.   Of note, the patient was hospitalized at [facility] in March 2023 for acute on chronic systolic heart failure, and was transition to hospice at that time.  However, most recent goals of care documentation, which occurred in March  2023, notes that the patient wishes to remain full code, also noting that the patient's power of attorney is his grandson,[name].  Per discussions with the EDP as well as my discussions with the patient this evening, he consistently conveys his desire to remain full code as well as his desire to pursue hemodialysis.   Denies any recent orthopnea, PND, or new onset/worsening peripheral edema.     ED Course:  Vital signs in the ED were notable for the following: Afebrile; heart rate 50-65; blood pressure 125/74; respiratory rate 17-19, oxygen saturation 99 to 100% on room air.  Labs were notable for the following: CMP notable for the following: Potassium 6.8, in the absence of any hemolysis, with this value subsequently rerun and confirmed; bicarbonate 21, anion gap 20; BUN 204; creatinine 18.42.  CBC notable for white cell count 9700.  Urinalysis showed no white blood cells and was negative for protein.  Imaging and additional notable ED work-up: EKG shows atrial paced rhythm with heart rate 70 no evidence of T wave changes, including no evidence of peaked T waves.  CT abdomen/pelvis shows no evidence of acute intra-abdominal nor any acute intrapelvic process, including no evidence of acute cholecystitis, choledocholithiasis, acute pancreatitis, or ureteral stone.   EDP discussed the patient's case with the on-call nephrologist, Dr. Joneen Caraway, Who will formally consult and is planning for hemodialysis this morning, recommending admission to the hospital service in the interval.  While in the ED, the following were administered: Albuterol nebulizer x1, NovoLog 5 units IV x1, amp of D50 x1, sodium bicarbonate 50 mEq IV x1 dose, Lokelma 10 g p.o. x1, Zofran 4 mg IV x1, calcium gluconate 1 g IV x1.   Subsequently, the patient was admitted  for observation for further evaluation and management of hyperkalemia in the setting of acute renal failure superimposed on CKD 5, as well as for clarification of goals  of care reconciling CODE STATUS, and assessing the patient's desire to continue hemodialysis after this morning's urgent session.    Review of Systems: As per HPI otherwise 10 point review of systems negative.   Past Medical History:  Diagnosis Date   AAA (abdominal aortic aneurysm) (HCC)    3.5 cm 04/2018   Atrial fibrillation (HCC)    CHF (congestive heart failure) (HCC)    Coronary artery disease    Prior stent   Degenerative joint disease    Full dentures    Gout    Hypercholesteremia    Hypertension    ICD (implantable cardioverter-defibrillator) battery depletion    Ischemic cardiomyopathy    Pneumonia    Renal insufficiency    Wears glasses     Past Surgical History:  Procedure Laterality Date   AV FISTULA PLACEMENT Right 11/26/2018   Procedure:     Right arm Brachiocephalic Fistula Creation   ;  Surgeon: Waynetta Sandy, MD;  Location: [place];  Service: Vascular;  Laterality: Right;   BACK SURGERY     CARDIAC SURGERY     CATARACT EXTRACTION W/ INTRAOCULAR LENS  IMPLANT, BILATERAL     CENTRAL LINE INSERTION  08/28/2021   Procedure: CENTRAL LINE INSERTION;  Surgeon: Jolaine Artist, MD;  Location: [place] [facility];  Service: Cardiovascular;;   COLONOSCOPY W/ BIOPSIES AND POLYPECTOMY     MASS EXCISION     neck   MULTIPLE TOOTH EXTRACTIONS     PACEMAKER PLACEMENT     RIGHT HEART CATH N/A 08/28/2021   Procedure: RIGHT HEART CATH;  Surgeon: Jolaine Artist, MD;  Location: [place] [facility];  Service: Cardiovascular;  Laterality: N/A;   SHOULDER SURGERY     left   TOTAL SHOULDER REPLACEMENT     WRIST SURGERY      Social History:  reports that he has been smoking cigars. He has never used smokeless tobacco. He reports that he does not currently use alcohol. He reports that he does not use drugs.   Allergies  Allergen Reactions   Hymenoptera Venom Preparations Hives    Reaction to wasps   Lisinopril Other (See Comments)    angioedema   Piroxicam  Other (See Comments)    burn   Shellfish Allergy Swelling   Amoxicillin-Pot Clavulanate Rash    Did it involve swelling of the face/tongue/throat, SOB, or low BP? No Did it involve sudden or severe rash/hives, skin peeling, or any reaction on the inside of your mouth or nose? Yes Did you need to seek medical attention at a hospital or doctor's office? Yes When did it last happen?      over 10 years If all above answers are "NO", may proceed with cephalosporin use.     Family History  Problem Relation Age of Onset   Heart disease Other        No family history   Diabetes Sister     Family history reviewed and not pertinent    Prior to Admission medications   Medication Sig Start Date End Date Taking? Authorizing Provider  allopurinol (ZYLOPRIM) 300 MG tablet Take 300 mg by mouth daily. 07/26/16   [provider]  amiodarone (PACERONE) 200 MG tablet Take 1 tablet (200 mg total) by mouth 2 (two) times daily. 09/07/21   Domenic Polite,  MD  apixaban (ELIQUIS) 2.5 MG TABS tablet Take 1 tablet (2.5 mg total) by mouth daily. 06/10/19   Lelon Perla, MD  atorvastatin (LIPITOR) 80 MG tablet Take 1 tablet (80 mg total) by mouth daily. 09/12/15   Lelon Perla, MD  midodrine (PROAMATINE) 10 MG tablet Take 1 tablet (10 mg total) by mouth 2 (two) times daily with a meal. 09/07/21   Domenic Polite, MD  nitroGLYCERIN (NITROSTAT) 0.4 MG SL tablet Place 1 tablet (0.4 mg total) under the tongue every 5 (five) minutes as needed for chest pain. 01/14/18 08/24/21  Erlene Quan, PA-C  oxyCODONE-acetaminophen (PERCOCET/ROXICET) 5-325 MG tablet Take 1 tablet by mouth every 6 (six) hours as needed. 11/26/18   Ulyses Amor, PA-C  PROAIR RESPICLICK 161 (90 Base) MCG/ACT AEPB Inhale 1 puff into the lungs every 4 (four) hours as needed (shortness of breath). 08/02/21   [provider]  Torsemide 40 MG TABS Take 40 mg by mouth daily. 09/08/21   Domenic Polite, MD     Objective     Physical Exam: Vitals:   01/06/22 0100 01/06/22 0115 01/06/22 0345 01/06/22 0530  BP: (!) 116/40 (!) 111/43 103/87   Pulse: (!) 50 (!) 50 70 65  Resp: '15 11 12 17  '$ Temp:      TempSrc:      SpO2: 100% 100% 100% 99%    General: appears to be stated age; alert, oriented Skin: warm, dry, no rash Head:  AT/[place] Mouth:  Oral mucosa membranes appear dry, normal dentition Neck: supple; trachea midline Heart:  RRR; did not appreciate any M/R/G Lungs: CTAB, did not appreciate any wheezes, rales, or rhonchi Abdomen: + BS; soft, ND, NT Vascular: 2+ pedal pulses b/l; 2+ radial pulses b/l Extremities: no peripheral edema, no muscle wasting; rue fistula noted Neuro: strength and sensation intact in upper and lower extremities b/l    Labs on Admission: I have personally reviewed following labs and imaging studies  CBC: Recent Labs  Lab 12/30/21 1127 12/30/21 1147 01/05/22 2248 01/06/22 0400  WBC 10.6*  --  9.7 8.9  NEUTROABS  --   --  7.7 6.6  HGB 13.6 15.6 12.0* 11.3*  HCT 40.8 46.0 35.9* 32.6*  MCV 81.4  --  81.8 78.9*  PLT 169  --  147* 096*   Basic Metabolic Panel: Recent Labs  Lab 12/30/21 1127 12/30/21 1147 01/05/22 2355 01/06/22 0400  NA 135 135 135 136  K 4.4 4.4 6.8* 6.0*  CL 93* 100 94* 94*  CO2 20*  --  21* 17*  GLUCOSE 147* 140* 94 133*  BUN 170* >130* 204* 209*  CREATININE 11.73* 13.00* 18.42* 18.08*  CALCIUM 11.1*  --  10.5* 10.4*  MG  --   --   --  2.2  PHOS  --   --   --  7.3*   GFR: Estimated Creatinine Clearance: 2.8 mL/min (A) (by C-G formula based on SCr of 18.08 mg/dL (H)). Liver Function Tests: Recent Labs  Lab 12/30/21 1127 01/05/22 2355 01/06/22 0400  AST 44* 33  --   ALT 71* 62*  --   ALKPHOS 61 56  --   BILITOT 1.4* 1.8*  --   PROT 7.5 6.9  --   ALBUMIN 3.8 3.4* 3.1*   Recent Labs  Lab 01/05/22 2355  LIPASE 49   No results for input(s): "AMMONIA" in the last 168 hours. Coagulation Profile: Recent Labs  Lab 12/30/21 1127   INR 1.5*  Cardiac Enzymes: No results for input(s): "CKTOTAL", "CKMB", "CKMBINDEX", "TROPONINI" in the last 168 hours. BNP (last 3 results) No results for input(s): "PROBNP" in the last 8760 hours. HbA1C: No results for input(s): "HGBA1C" in the last 72 hours. CBG: Recent Labs  Lab 01/06/22 0214 01/06/22 0318  GLUCAP 93 170*   Lipid Profile: No results for input(s): "CHOL", "HDL", "LDLCALC", "TRIG", "CHOLHDL", "LDLDIRECT" in the last 72 hours. Thyroid Function Tests: No results for input(s): "TSH", "T4TOTAL", "FREET4", "T3FREE", "THYROIDAB" in the last 72 hours. Anemia Panel: No results for input(s): "VITAMINB12", "FOLATE", "FERRITIN", "TIBC", "IRON", "RETICCTPCT" in the last 72 hours. Urine analysis:    Component Value Date/Time   COLORURINE YELLOW 01/05/2022 2330   APPEARANCEUR HAZY (A) 01/05/2022 2330   LABSPEC 1.014 01/05/2022 2330   PHURINE 5.0 01/05/2022 2330   GLUCOSEU NEGATIVE 01/05/2022 2330   HGBUR NEGATIVE 01/05/2022 2330   BILIRUBINUR NEGATIVE 01/05/2022 2330   KETONESUR NEGATIVE 01/05/2022 2330   PROTEINUR NEGATIVE 01/05/2022 2330   NITRITE NEGATIVE 01/05/2022 2330   LEUKOCYTESUR NEGATIVE 01/05/2022 2330    Radiological Exams on Admission: CT ABDOMEN PELVIS WO CONTRAST  Result Date: 01/05/2022 CLINICAL DATA:  Acute abdominal pain EXAM: CT ABDOMEN AND PELVIS WITHOUT CONTRAST TECHNIQUE: Multidetector CT imaging of the abdomen and pelvis was performed following the standard protocol without IV contrast. Unenhanced CT was performed per clinician order. Lack of IV contrast limits sensitivity and specificity, especially for evaluation of abdominal/pelvic solid viscera. RADIATION DOSE REDUCTION: This exam was performed according to the departmental dose-optimization program which includes automated exposure control, adjustment of the mA and/or kV according to patient size and/or use of iterative reconstruction technique. COMPARISON:  12/30/2021 FINDINGS: Lower chest:  No acute pleural or parenchymal lung disease. Hepatobiliary: High attenuation material within the gallbladder may reflect sludge or vicarious excretion of previously administered contrast. No calcified gallstones or evidence of cholecystitis. Unremarkable unenhanced appearance of the liver. Pancreas: Unremarkable unenhanced appearance. Spleen: Unremarkable unenhanced appearance. Adrenals/Urinary Tract: There is bilateral renal cortical atrophy, with retention of previously administered contrast within the renal parenchyma, consistent with end-stage renal disease. Stable simple cyst upper pole right kidney. No urinary tract calculi or obstructive uropathy. High attenuation material within the bladder lumen consistent with excretion of previously administered contrast. The adrenals are stable. Stomach/Bowel: No bowel obstruction or ileus. Normal appendix right lower quadrant. No bowel wall thickening or inflammatory change. Vascular/Lymphatic: Stable fusiform infrarenal abdominal aortic aneurysm measuring up to 3.9 cm. Bilateral common iliac artery aneurysms, measuring 2 cm on the right and 2.3 cm on the left. Evaluation of the aortic lumen is limited without IV contrast. No evidence of aneurysm leak or rupture. Diffuse atherosclerosis is again noted. No pathologic adenopathy. Reproductive: Stable enlargement of the prostate. Other: No free fluid or free intraperitoneal gas. Stable fat containing right inguinal hernia. Musculoskeletal: No acute or destructive bony lesions. Reconstructed images demonstrate no additional findings. IMPRESSION: 1. No acute intra-abdominal or intrapelvic process. 2. 3.9 cm infrarenal abdominal aortic aneurysm. Recommend follow-up every 2 years. Reference: J Am Coll Radiol 6314;[id number]. 3. Sequela of chronic renal insufficiency, with retention of contrast within the renal parenchyma, delayed excretion of contrast in the bladder, and vicarious excretion of contrast within the gallbladder.  4. Stable enlarged prostate. 5. Stable fat containing right inguinal hernia. 6.  Aortic Atherosclerosis (ICD10-I70.0). Electronically Signed   By: Randa Ngo M.D.   On: 01/05/2022 23:24   CT Head Wo Contrast  Result Date: 01/05/2022 CLINICAL DATA:  Head trauma. EXAM: CT HEAD  WITHOUT CONTRAST TECHNIQUE: Contiguous axial images were obtained from the base of the skull through the vertex without intravenous contrast. RADIATION DOSE REDUCTION: This exam was performed according to the departmental dose-optimization program which includes automated exposure control, adjustment of the mA and/or kV according to patient size and/or use of iterative reconstruction technique. COMPARISON:  Head CT dated 12/30/2021. FINDINGS: Brain: Mild age-related atrophy and chronic microvascular ischemic changes. There is no acute intracranial hemorrhage. No mass effect or midline shift. No extra-axial fluid collection. Vascular: No hyperdense vessel or unexpected calcification. Skull: Normal. Negative for fracture or focal lesion. Sinuses/Orbits: No acute finding. Other: None IMPRESSION: 1. No acute intracranial pathology. 2. Mild age-related atrophy and chronic microvascular ischemic changes. Electronically Signed   By: Anner Crete M.D.   On: 01/05/2022 23:24     EKG: Independently reviewed, with result as described above.    Assessment/Plan   Principal Problem:   Hyperkalemia Active Problems:   Acute renal failure superimposed on stage 5 chronic kidney disease, not on chronic dialysis (HCC)   PAF (paroxysmal atrial fibrillation) (HCC) on chronic anticoagulation   Hyperlipidemia   Nausea & vomiting   Incidental finding of COVID-19 virus infection   Chronic systolic CHF (congestive heart failure) (HCC)     #) Hyperkalemia: Presenting serum potassium level 6.8, without evidence of hemolysis, as consequence of acute renal failure superimposed on stage V CKD, without overt evidence of associated EKG changes.   Nephrology consulted, planning for urgent hemodialysis this morning via existing right upper extremity fistula.  Of note, he has received interval medical management via albuterol nebulizer, IV NovoLog, serum bicarbonate, Lokelma, calcium gluconate, as further detailed above.  Plan: Nephrology consulted, with plan for urgent hemodialysis this morning.  Repeat renal function panel in the morning.  Add on serum magnesium level.  Monitor strict I's and O's and daily weights.  Monitor on telemetry.        #) Acute renal failure superimposed on CKD 5: In the setting of baseline creatinine of 4-5, there has been recent worsening of the patient's renal function, as further quantified above, including presenting creatinine of 18.4 this evening, now with hyperkalemia, anion gap metabolic acidosis.  Suspect that his progressive nausea/vomiting over the course the last 10 days is a consequence of worsening uremia, although the patient appears alert and oriented per my discussions with him.  No overt evidence of acute volume overload, and the patient does not appear to be in any acute respiratory distress.   Plan: Nephrology consulted, with plan for hemodialysis this morning.  Palliative care consult placed for assistance with clarification of goals of care, including assessing if the patient wishes to continue with additional hemodialysis sessions after this morning's urgent session, particularly given his hospice status.  Monitor strict I's and O's and daily weights.  Repeat renal function panel in the morning.  Add on serum magnesium level check serum phosphorus level.        #) Nausea/vomiting: Recurrent nausea/vomiting over the course of the at least the last 10 days, suspected to be as a result of progressive uremia, as above.  CT abdomen/pelvis shows no evidence of acute intra-abdominal or acute intrapelvic process, as further detailed above.  Suspected COVID-19 finding 1 week ago was incidental.    Plan: Prn Zofran.  Add on serum magnesium level.  Monitor strict I's and O's Daily weights.  Further evaluation management of presenting acute renal failure superimposed on CKD 5, as above, plan for urgent hemodialysis this morning.       #)  Incidental COVID-19 diagnosis: Considering the patient's recent nausea/vomiting, he was found to be positive for COVID-19 when checked on 12/30/2021 as an outpatient.  Not associate with any symptoms consistent with acute viral syndrome, while nausea/vomiting suspected to be the basis of the patient's interval worsening renal function, as above.  He is not being admitted for COVID-19, and is outside of the window for antiviral intervention given the timing of onset of his nausea/vomiting even if this were to be as a result of underlying COVID.  Rather, we will pursue supportive measures only.  Plan: Repeat CBC in the morning.            #) Paroxysmal atrial fibrillation: Documented history of such. In setting of CHA2DS2-VASc score of 5, there is an indication for chronic anticoagulation for thromboembolic prophylaxis. Consistent with this, patient is chronically anticoagulated on Eliquis.  On amiodarone as an outpatient. most recent echocardiogram was performed in March 2023 was notable for LVEF 20 to 25%. Presenting EKG shows atrial paced rhythm, as above.   Plan: monitor strict I's & O's and daily weights. Repeat renal function panel/CBC in AM. Check serum mag level.  Continue outpatient Eliquis as well as amiodarone.  Monitor on telemetry.          #) Chronic systolic heart failure: Documented history of such with most recent echo in March 2023 notable for LVEF 20 to 25%, per chart review this appears to be basis of ischemic or status post ICD placement.  On torsemide 40 mg p.o. daily.  No clinical evidence to suggest acute volume overload at this time in spite of interval worsening of the patient's renal function, as above.  Plan: Holding  home torsemide leading up to the hemodialysis session anticipated this morning.  Monitor strict I's and O's and daily weights.  Add on serum magnesium level of renal function panel this morning.           #) Hyperlipidemia: documented h/o such. On high intensity atorvastatin as outpatient.    Plan: continue home statin.       DVT prophylaxis: SCD's + home Eliquis Code Status: Full code (per chart review and confirmed via my discussions with the patient this evening; palliative care consult placed for further assessment of goals of care as well as CODE STATUS clarification, as further detailed above. Family Communication: none Disposition Plan: Per Rounding Team Consults called: Dr. Joneen Caraway of Nephrology has been consulted, as further detailed above;  Admission status: Observation    PLEASE NOTE THAT DRAGON DICTATION SOFTWARE WAS USED IN THE CONSTRUCTION OF THIS NOTE.   Maries DO [facility]  From [place]   01/06/2022, 5:54 AM

## 2022-01-06 NOTE — ED Provider Notes (Signed)
Care assumed from [facility], patient with vomiting which has been controlled in the ED, known renal insufficiency with metabolic panel pending.  Plan is for discharge if no need for emergent treatment based on chemistry values.  I have reviewed the comprehensive metabolic panel and my interpretation is severe renal failure with severe hyperkalemia, potassium 6.8.  Additional significant findings are borderline elevated calcium, mild elevation of total bilirubin, mild elevation of ALT, none of which are felt to be clinically significant.  Patient needs emergent treatment for hyperkalemia.  I have ordered glucose, insulin, calcium, sodium bicarbonate, nebulized albuterol.  I have placed a consult for nephrology for urgent dialysis.  Patient is not on dialysis but does have an AV fistula present in the right antecubital space with thrill present.  I suspect that his nausea and vomiting is secondary to uremia.  Case is discussed with Dr. Jonnie Finner, on-call for nephrology.  He states that he will see the patient for dialysis.  In absence of ECG changes, dialysis will be done urgently but not emergently.  He request hospitalist admit the patient.  Case has been discussed with Dr. Velia Meyer of [facility], who agrees to admit the patient.  I have reviewed his record in a little bit more detail, he was placed on hospice care in March for heart failure.  Since then, BUN and creatinine have been increasing with rather dramatic increase over the last week.  Today was the first time potassium was noted to be elevated.  After potassium is treated emergently, decision will need to be made whether to come off hospice in order to have regular dialysis or except slow death by uremia.  CRITICAL CARE Performed by: Delora Fuel Total critical care time: 45 minutes Critical care time was exclusive of separately billable procedures and treating other patients. Critical care was necessary to treat or prevent imminent or  life-threatening deterioration. Critical care was time spent personally by me on the following activities: development of treatment plan with patient and/or surrogate as well as nursing, discussions with consultants, evaluation of patient's response to treatment, examination of patient, obtaining history from patient or surrogate, ordering and performing treatments and interventions, ordering and review of laboratory studies, ordering and review of radiographic studies, pulse oximetry and re-evaluation of patient's condition.  Results for orders placed or performed during the hospital encounter of 01/05/22  CBC with Differential  Result Value Ref Range   WBC 9.7 4.0 - 10.5 K/uL   RBC 4.39 4.22 - 5.81 MIL/uL   Hemoglobin 12.0 (L) 13.0 - 17.0 g/dL   HCT 35.9 (L) 39.0 - 52.0 %   MCV 81.8 80.0 - 100.0 fL   MCH 27.3 26.0 - 34.0 pg   MCHC 33.4 30.0 - 36.0 g/dL   RDW 18.8 (H) 11.5 - 15.5 %   Platelets 147 (L) 150 - 400 K/uL   nRBC 0.0 0.0 - 0.2 %   Neutrophils Relative % 79 %   Neutro Abs 7.7 1.7 - 7.7 K/uL   Lymphocytes Relative 9 %   Lymphs Abs 0.9 0.7 - 4.0 K/uL   Monocytes Relative 10 %   Monocytes Absolute 1.0 0.1 - 1.0 K/uL   Eosinophils Relative 1 %   Eosinophils Absolute 0.1 0.0 - 0.5 K/uL   Basophils Relative 0 %   Basophils Absolute 0.0 0.0 - 0.1 K/uL   Immature Granulocytes 1 %   Abs Immature Granulocytes 0.07 0.00 - 0.07 K/uL  Urinalysis, Routine w reflex microscopic Urine, Clean Catch  Result Value Ref Range   Color, Urine YELLOW YELLOW   APPearance HAZY (A) CLEAR   Specific Gravity, Urine 1.014 1.005 - 1.030   pH 5.0 5.0 - 8.0   Glucose, UA NEGATIVE NEGATIVE mg/dL   Hgb urine dipstick NEGATIVE NEGATIVE   Bilirubin Urine NEGATIVE NEGATIVE   Ketones, ur NEGATIVE NEGATIVE mg/dL   Protein, ur NEGATIVE NEGATIVE mg/dL   Nitrite NEGATIVE NEGATIVE   Leukocytes,Ua NEGATIVE NEGATIVE  Comprehensive metabolic panel  Result Value Ref Range   Sodium 135 135 - 145 mmol/L    Potassium 6.8 (HH) 3.5 - 5.1 mmol/L   Chloride 94 (L) 98 - 111 mmol/L   CO2 21 (L) 22 - 32 mmol/L   Glucose, Bld 94 70 - 99 mg/dL   BUN 204 (H) 8 - 23 mg/dL   Creatinine, Ser 18.42 (H) 0.61 - 1.24 mg/dL   Calcium 10.5 (H) 8.9 - 10.3 mg/dL   Total Protein 6.9 6.5 - 8.1 g/dL   Albumin 3.4 (L) 3.5 - 5.0 g/dL   AST 33 15 - 41 U/L   ALT 62 (H) 0 - 44 U/L   Alkaline Phosphatase 56 38 - 126 U/L   Total Bilirubin 1.8 (H) 0.3 - 1.2 mg/dL   GFR, Estimated 2 (L) >60 mL/min   Anion gap 20 (H) 5 - 15  Lipase, blood  Result Value Ref Range   Lipase 49 11 - 51 U/L   CT ABDOMEN PELVIS WO CONTRAST  Result Date: 01/05/2022 CLINICAL DATA:  Acute abdominal pain EXAM: CT ABDOMEN AND PELVIS WITHOUT CONTRAST TECHNIQUE: Multidetector CT imaging of the abdomen and pelvis was performed following the standard protocol without IV contrast. Unenhanced CT was performed per clinician order. Lack of IV contrast limits sensitivity and specificity, especially for evaluation of abdominal/pelvic solid viscera. RADIATION DOSE REDUCTION: This exam was performed according to the departmental dose-optimization program which includes automated exposure control, adjustment of the mA and/or kV according to patient size and/or use of iterative reconstruction technique. COMPARISON:  12/30/2021 FINDINGS: Lower chest: No acute pleural or parenchymal lung disease. Hepatobiliary: High attenuation material within the gallbladder may reflect sludge or vicarious excretion of previously administered contrast. No calcified gallstones or evidence of cholecystitis. Unremarkable unenhanced appearance of the liver. Pancreas: Unremarkable unenhanced appearance. Spleen: Unremarkable unenhanced appearance. Adrenals/Urinary Tract: There is bilateral renal cortical atrophy, with retention of previously administered contrast within the renal parenchyma, consistent with end-stage renal disease. Stable simple cyst upper pole right kidney. No urinary tract  calculi or obstructive uropathy. High attenuation material within the bladder lumen consistent with excretion of previously administered contrast. The adrenals are stable. Stomach/Bowel: No bowel obstruction or ileus. Normal appendix right lower quadrant. No bowel wall thickening or inflammatory change. Vascular/Lymphatic: Stable fusiform infrarenal abdominal aortic aneurysm measuring up to 3.9 cm. Bilateral common iliac artery aneurysms, measuring 2 cm on the right and 2.3 cm on the left. Evaluation of the aortic lumen is limited without IV contrast. No evidence of aneurysm leak or rupture. Diffuse atherosclerosis is again noted. No pathologic adenopathy. Reproductive: Stable enlargement of the prostate. Other: No free fluid or free intraperitoneal gas. Stable fat containing right inguinal hernia. Musculoskeletal: No acute or destructive bony lesions. Reconstructed images demonstrate no additional findings. IMPRESSION: 1. No acute intra-abdominal or intrapelvic process. 2. 3.9 cm infrarenal abdominal aortic aneurysm. Recommend follow-up every 2 years. Reference: J Am Coll Radiol 5400;86:761-950. 3. Sequela of chronic renal insufficiency, with retention of contrast within the renal parenchyma, delayed excretion of contrast in  the bladder, and vicarious excretion of contrast within the gallbladder. 4. Stable enlarged prostate. 5. Stable fat containing right inguinal hernia. 6.  Aortic Atherosclerosis (ICD10-I70.0). Electronically Signed   By: Randa Ngo M.D.   On: 01/05/2022 23:24   CT Head Wo Contrast  Result Date: 01/05/2022 CLINICAL DATA:  Head trauma. EXAM: CT HEAD WITHOUT CONTRAST TECHNIQUE: Contiguous axial images were obtained from the base of the skull through the vertex without intravenous contrast. RADIATION DOSE REDUCTION: This exam was performed according to the departmental dose-optimization program which includes automated exposure control, adjustment of the mA and/or kV according to patient  size and/or use of iterative reconstruction technique. COMPARISON:  Head CT dated 12/30/2021. FINDINGS: Brain: Mild age-related atrophy and chronic microvascular ischemic changes. There is no acute intracranial hemorrhage. No mass effect or midline shift. No extra-axial fluid collection. Vascular: No hyperdense vessel or unexpected calcification. Skull: Normal. Negative for fracture or focal lesion. Sinuses/Orbits: No acute finding. Other: None IMPRESSION: 1. No acute intracranial pathology. 2. Mild age-related atrophy and chronic microvascular ischemic changes. Electronically Signed   By: Anner Crete M.D.   On: 01/05/2022 23:24   CT HEAD WO CONTRAST (5MM)  Result Date: 12/30/2021 CLINICAL DATA:  Head trauma EXAM: CT HEAD WITHOUT CONTRAST TECHNIQUE: Contiguous axial images were obtained from the base of the skull through the vertex without intravenous contrast. RADIATION DOSE REDUCTION: This exam was performed according to the departmental dose-optimization program which includes automated exposure control, adjustment of the mA and/or kV according to patient size and/or use of iterative reconstruction technique. COMPARISON:  CT brain 12/30/2021 FINDINGS: Brain: No acute territorial infarction or intracranial mass is visualized. Trace amounts of subarachnoid blood along the left frontal sulci without significant change. Atrophy. Mild chronic small vessel ischemic changes of the white matter. Nonenlarged ventricles. Vascular: Mildly dense venous sinuses likely due to previous contrast administration. Carotid vascular calcification. Skull: Normal. Negative for fracture or focal lesion. Sinuses/Orbits: No acute finding. Other: Small right forehead scalp hematoma IMPRESSION: 1. Similar trace subarachnoid blood within left frontal sulci. No new foci of hemorrhage. 2. Atrophy Electronically Signed   By: Donavan Foil M.D.   On: 12/30/2021 19:58   CT CERVICAL SPINE WO CONTRAST  Result Date: 12/30/2021 CLINICAL  DATA:  Trauma. EXAM: CT CERVICAL SPINE WITHOUT CONTRAST TECHNIQUE: Multidetector CT imaging of the cervical spine was performed without intravenous contrast. Multiplanar CT image reconstructions were also generated. RADIATION DOSE REDUCTION: This exam was performed according to the departmental dose-optimization program which includes automated exposure control, adjustment of the mA and/or kV according to patient size and/or use of iterative reconstruction technique. COMPARISON:  None Available. FINDINGS: Alignment: No traumatic malalignment. Skull base and vertebrae: No acute fracture. No primary bone lesion or focal pathologic process. Soft tissues and spinal canal: No prevertebral fluid or swelling. No visible canal hematoma. Disc levels: Mild multilevel disc height loss and uncovertebral hypertrophy. Moderate to severe left-sided facet arthropathy at C3-C4 and C4-C5. Bilateral C2-C3 facet ankylosis. Upper chest: Please see separate chest CT report from same day. Other: None. IMPRESSION: 1. No acute fracture or traumatic malalignment of the cervical spine. Electronically Signed   By: Titus Dubin M.D.   On: 12/30/2021 13:01   CT CHEST ABDOMEN PELVIS W CONTRAST  Result Date: 12/30/2021 CLINICAL DATA:  Trauma. EXAM: CT CHEST, ABDOMEN, AND PELVIS WITH CONTRAST TECHNIQUE: Multidetector CT imaging of the chest, abdomen and pelvis was performed following the standard protocol during bolus administration of intravenous contrast. RADIATION DOSE REDUCTION: This  exam was performed according to the departmental dose-optimization program which includes automated exposure control, adjustment of the mA and/or kV according to patient size and/or use of iterative reconstruction technique. CONTRAST:  25m OMNIPAQUE IOHEXOL 350 MG/ML SOLN COMPARISON:  Chest x-ray from same day. CT abdomen pelvis dated August 24, 2021. FINDINGS: CT CHEST FINDINGS Cardiovascular: No significant vascular findings. Normal heart size. No  pericardial effusion. No thoracic aortic aneurysm or dissection. Coronary, aortic arch, and branch vessel atherosclerotic vascular disease. No central pulmonary embolism. Left chest wall pacemaker. Mediastinum/Nodes: No enlarged mediastinal, hilar, or axillary lymph nodes. Thyroid gland and esophagus demonstrate no significant findings. Lungs/Pleura: Small amount of secretions in the trachea and right mainstem bronchus. No focal consolidation, pleural effusion, or pneumothorax. Mild centrilobular emphysema. Musculoskeletal: No acute or significant osseous findings. CT ABDOMEN PELVIS FINDINGS Hepatobiliary: No hepatic injury or perihepatic hematoma. Layering gallbladder sludge. No biliary dilatation. Pancreas: Unremarkable. No pancreatic ductal dilatation or surrounding inflammatory changes. Spleen: No splenic injury or perisplenic hematoma. Adrenals/Urinary Tract: No adrenal hemorrhage or renal injury identified. Unchanged left-greater-than-right renal atrophy and bilateral renal simple cysts. No follow-up imaging is recommended. No renal calculi or hydronephrosis. Bladder is unremarkable for the degree of distention. Stomach/Bowel: Small hiatal hernia. The stomach is otherwise within normal limits. No bowel wall thickening, distention, or surrounding inflammatory changes. Normal appendix. Vascular/Lymphatic: Fusiform dilatation of the infrarenal abdominal aorta measuring up to 4.0 cm, unchanged. 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 Aortoiliac atherosclerotic vascular disease. The left internal iliac artery is occluded proximally with distal reconstitution. Reproductive: Unchanged mild prostatomegaly. Other: Unchanged small fat containing right inguinal hernia. No free fluid or pneumoperitoneum. Musculoskeletal: No acute or significant osseous findings. IMPRESSION: 1. No acute traumatic injury in the chest, abdomen, or pelvis. 2. Unchanged 4.0  cm infrarenal abdominal aortic aneurysm. Recommend follow-up every 12 months and vascular consultation. This recommendation follows ACR consensus guidelines: White Paper of the ACR Incidental Findings Committee II on Vascular Findings. J Am Coll Radiol 2013; 10:789-794. 3. Aortic Atherosclerosis (ICD10-I70.0) and Emphysema (ICD10-J43.9). Electronically Signed   By: WTitus DubinM.D.   On: 12/30/2021 12:58   CT HEAD WO CONTRAST  Result Date: 12/30/2021 CLINICAL DATA:  Head trauma, moderate-severe EXAM: CT HEAD WITHOUT CONTRAST TECHNIQUE: Contiguous axial images were obtained from the base of the skull through the vertex without intravenous contrast. RADIATION DOSE REDUCTION: This exam was performed according to the departmental dose-optimization program which includes automated exposure control, adjustment of the mA and/or kV according to patient size and/or use of iterative reconstruction technique. COMPARISON:  None Available. CT head 12/28/2015. FINDINGS: Brain: Trace acute subarachnoid hemorrhage along the left frontal convexity (series 3, image 15; series 5, image 15). No evidence of acute large vascular territory infarct, mass lesion, midline shift, or hydrocephalus. Vascular: No hyperdense vessel identified. Calcific atherosclerosis. Skull: No acute fracture.  Right forehead contusion. Sinuses/Orbits: Visualized sinuses are clear. No acute orbital findings. Other: No mastoid effusions. IMPRESSION: 1. Trace acute subarachnoid hemorrhage along the left frontal convexity. 2. Right forehead contusion without acute calvarial fracture. Findings discussed with Dr. LArmandina Gemmavia telephone at 12:46 PM. Electronically Signed   By: FMargaretha SheffieldM.D.   On: 12/30/2021 12:48   DG Pelvis Portable  Result Date: 12/30/2021 CLINICAL DATA:  Fall.  Patient on blood thinners. EXAM: PORTABLE PELVIS 1-2 VIEWS COMPARISON:  None Available. FINDINGS: No fracture or bone lesion. Mild medial hip joint space narrowing. Bony  prominence from the acetabular rims.  SI joints and symphysis pubis are normally spaced and aligned. Aortoiliac and femoral artery vascular calcifications. IMPRESSION: 1. No fracture or acute finding. Electronically Signed   By: Lajean Manes M.D.   On: 12/30/2021 11:43   DG Chest Port 1 View  Result Date: 12/30/2021 CLINICAL DATA:  Fall.  Patient on blood thinners. EXAM: PORTABLE CHEST 1 VIEW COMPARISON:  08/24/2021. FINDINGS: Cardiac silhouette normal in size. Stable left anterior chest wall AICD. No mediastinal or hilar masses. Clear lungs.  No pleural effusion or pneumothorax. Skeletal structures are grossly intact. IMPRESSION: No active disease. Electronically Signed   By: Lajean Manes M.D.   On: 60/45/4098 11:91       Delora Fuel, MD 47/82/95 [phone]

## 2022-01-06 NOTE — Consult Note (Signed)
Renal Service Consult Note [facility]  [facility] 01/06/2022 Sol Blazing, MD Requesting Physician: Dr. Reesa Chew   Reason for Consult: Renal failure HPI: The patient is a 81 y.o. year-old w/ hx of atrial fib, chronic syst CHF sp ICD, advanced CKD V presented to ED last night from home for N/V for 7 10 days. Baseline creat was 4-5 in march and June 2023. Pt has RUE AVF which is matured. During hospital in March 9604 for a/c systolic CHF pt was transitioned to hospice. However per admitting team now the patient wishes for full code and normal medical care and we are asked to see patient for dialysis.   Labs show creat 18, was 13 one week ago and 5.6 on march 17th prior. Pt seen in ED room. Pt is having significant tremors but is fully alert and engaging in conversation. Denies sig SOB, swelling.   Pt lives w/ his grandson and granddaughter. Main c/o's are too weak to walk, tremors and falls.   ROS - denies CP, no joint pain, no HA, no blurry vision, no rash, no diarrhea, no nausea/ vomiting, no dysuria, no difficulty voiding   Past Medical History  Past Medical History:  Diagnosis Date   AAA (abdominal aortic aneurysm) (HCC)    3.5 cm 04/2018   Atrial fibrillation (HCC)    CHF (congestive heart failure) ([place])    Coronary artery disease    Prior stent   Degenerative joint disease    Full dentures    Gout    Hypercholesteremia    Hypertension    ICD (implantable cardioverter-defibrillator) battery depletion    Ischemic cardiomyopathy    Pneumonia    Renal insufficiency    Wears glasses    Past Surgical History  Past Surgical History:  Procedure Laterality Date   AV FISTULA PLACEMENT Right 11/26/2018   Procedure:     Right arm Brachiocephalic Fistula Creation   ;  Surgeon: Waynetta Sandy, MD;  Location: [place];  Service: Vascular;  Laterality: Right;   BACK SURGERY     CARDIAC SURGERY     CATARACT EXTRACTION W/ INTRAOCULAR LENS  IMPLANT,  BILATERAL     CENTRAL LINE INSERTION  08/28/2021   Procedure: CENTRAL LINE INSERTION;  Surgeon: Jolaine Artist, MD;  Location: [place] [facility];  Service: Cardiovascular;;   COLONOSCOPY W/ BIOPSIES AND POLYPECTOMY     MASS EXCISION     neck   MULTIPLE TOOTH EXTRACTIONS     PACEMAKER PLACEMENT     RIGHT HEART CATH N/A 08/28/2021   Procedure: RIGHT HEART CATH;  Surgeon: Jolaine Artist, MD;  Location: [place] [facility];  Service: Cardiovascular;  Laterality: N/A;   SHOULDER SURGERY     left   TOTAL SHOULDER REPLACEMENT     WRIST SURGERY     Family History  Family History  Problem Relation Age of Onset   Heart disease Other        No family history   Diabetes Sister    Social History  reports that he has been smoking cigars. He has never used smokeless tobacco. He reports that he does not currently use alcohol. He reports that he does not use drugs. Allergies  Allergies  Allergen Reactions   Hymenoptera Venom Preparations Hives    Reaction to wasps   Lisinopril Other (See Comments)    angioedema   Piroxicam Other (See Comments)    burn   Shellfish Allergy Swelling   Amoxicillin-Pot  Clavulanate Rash    Did it involve swelling of the face/tongue/throat, SOB, or low BP? No Did it involve sudden or severe rash/hives, skin peeling, or any reaction on the inside of your mouth or nose? Yes Did you need to seek medical attention at a hospital or doctor's office? Yes When did it last happen?      over 10 years If all above answers are "NO", may proceed with cephalosporin use.    Home medications Prior to Admission medications   Medication Sig Start Date End Date Taking? Authorizing Provider  allopurinol (ZYLOPRIM) 300 MG tablet Take 300 mg by mouth daily. 07/26/16   [provider]  amiodarone (PACERONE) 200 MG tablet Take 1 tablet (200 mg total) by mouth 2 (two) times daily. 09/07/21   Domenic Polite, MD  apixaban (ELIQUIS) 2.5 MG TABS tablet Take 1 tablet (2.5 mg  total) by mouth daily. 06/10/19   Lelon Perla, MD  atorvastatin (LIPITOR) 80 MG tablet Take 1 tablet (80 mg total) by mouth daily. 09/12/15   Lelon Perla, MD  midodrine (PROAMATINE) 10 MG tablet Take 1 tablet (10 mg total) by mouth 2 (two) times daily with a meal. 09/07/21   Domenic Polite, MD  nitroGLYCERIN (NITROSTAT) 0.4 MG SL tablet Place 1 tablet (0.4 mg total) under the tongue every 5 (five) minutes as needed for chest pain. 01/14/18 08/24/21  Erlene Quan, PA-C  oxyCODONE-acetaminophen (PERCOCET/ROXICET) 5-325 MG tablet Take 1 tablet by mouth every 6 (six) hours as needed. 11/26/18   Ulyses Amor, PA-C  PROAIR RESPICLICK 638 (90 Base) MCG/ACT AEPB Inhale 1 puff into the lungs every 4 (four) hours as needed (shortness of breath). 08/02/21   [provider]  Torsemide 40 MG TABS Take 40 mg by mouth daily. 09/08/21   Domenic Polite, MD     Vitals:   01/06/22 0636 01/06/22 0715 01/06/22 0745 01/06/22 0747  BP:    104/86  Pulse: 68 62 61 61  Resp: 16 20 (!) 21 (!) 21  Temp:    97.6 F (36.4 C)  TempSrc:    Oral  SpO2: 100% 98% 94% 94%   Exam Gen alert, no distress No rash, cyanosis or gangrene Sclera anicteric, throat clear  No jvd or bruits Chest clear bilat to bases, no rales/ wheezing RRR no MRG Abd soft ntnd no mass or ascites +bs GU normal MS no joint effusions or deformity Ext no LE or UE edema, no wounds or ulcers Neuro is alert, Ox 3 , nf      Home meds include - allopurinol, amiodarone, apixaban 2.5 bid, atrovastatin, midodrine 10 bid, sl ntg, oxycodone-acetaminophen, proair respiclick, torsemide 40 qd    CXR 7/9 (NOT today) - IMPRESSION: No active disease.   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 Pt here w/ severe azotemia, n/v for 10 days, tremors/ mild asterixis and gait failure. Suspect most of this is due to uremia. His he not confused and wants  aggressive care at this time. Has a RUA AV fistula placed in June 2020. Plan is for initiation of dialysis starting today. Will plan gentle HD today and tomorrow, slowly ^'ing BFR/ DFR and HD time to avoid dysequilibrium. Have d/w pt who agrees to proceed.  Fatigue/ gait failure/ asterixis -  suspect should get better w/ dialysis Volume - looks a bit dry, will keep even on HD. DC torsemide.  Hyperkalemia - K+ 6.8 > 6.0 after temporizing measures. HD should help to get K+ into normal range.  MBD ckd - phos 7, start auryxia 1 ac tid, get pth.  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  Atrial fib - on eliquis and amiodarone BP - not on BP lowering meds at home, BP's wnl here.       Kelly Splinter  MD 01/06/2022, 7:57 AM Recent Labs  Lab 01/05/22 2248 01/05/22 2355 01/06/22 0400  HGB 12.0*  --  11.3*  ALBUMIN  --  3.4* 3.1*  CALCIUM  --  10.5* 10.4*  PHOS  --   --  7.3*  CREATININE  --  18.42* 18.08*  K  --  6.8* 6.0*

## 2022-01-06 NOTE — ED Notes (Signed)
Notified MD, Reesa Chew, that patient's diastolic value persistently in the 40s however patient trending this way all night and per patient this is "normal" for him. No orders received at this time.

## 2022-01-06 NOTE — Progress Notes (Signed)
[place] 6E26 [facility]) hospitalized hospice patient visit 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 Patient lives with his grandson and his family. He began having worsening "feeling bad" and nausea and vomiting on the night of 7.15 and presented to the ED. He did call [facility] but opted to go to ED for evaluation. He was admitted 7.16 with a diagnosis of nausea/vomiting. Per Dr. Eulas Post with [facility] this is a related hospital admission.  Visited with [name] at bedside, he is alone. They attempted dialysis today but he could not tolerate. Have exchanged report with medical team, palliative team as well as speaking with patient's grandson Anthony Foster by phone. We  discussed his goals and that if his goals are to seek further aggressive treatment then it may not be in line with a hospice plan of care. PMT plans to re-address with both patient and grandson tomorrow.  Patient is inpatient appropriate due to severe electrolyte imbalances and need for more intense monitoring. Vital Signs-  97.8/72/18   111/48   100% room air Intake/Output- not yet recorded Abnormal labs-  K+ 6, Chloride 94, CO2 17, Glucose 133, BUN 209, Creatinine 18.08, Ca+ 10.4, Phos 7.3, Albumin 3.1, ALT 62, GFR 2, Hgb 11.3, HCT 32.6, Platelets 142 Diagnostics-   CT ABDOMEN AND PELVIS WITHOUT CONTRAST IMPRESSION: 1. No acute intra-abdominal or intrapelvic process. 2. 3.9 cm infrarenal abdominal aortic aneurysm. Recommend follow-up every 2 years. Reference: J Am Coll Radiol 4098;11:914-782. 3. Sequela of chronic renal insufficiency, with retention of contrast within the renal parenchyma, delayed excretion of contrast in the bladder, and vicarious excretion of contrast within the gallbladder. 4. Stable enlarged prostate. 5. Stable fat containing right inguinal hernia. 6.  Aortic  Atherosclerosis (ICD10-I70.0).  Electronically Signed   By: Randa Ngo M.D.   On: 01/05/2022 23:24 IV/PRN Meds- None Problem List Hyperkalemia - Potassium 6.8 without acute EKG changes.  Nephrology team consulted for dialysis.  Patient did receive routine hyperkalemia treatment   CKD stage V now ESRD. - Baseline creatinine 5, admission creatinine 18.  HD plans per nephrology.  Palliative care consulted as patient is on hospice.  Hospice team aware.  He does have rapid upper extremity AV fistula which is mature.   Nausea vomiting - Suspect from uremia.  CT abdomen pelvis does not show any acute pathology.  Also had incidental finding of COVID-19 about a week ago.   COVID-19 infection - Incidental finding on 7/9 as outpatient.  Supportive measures at this time.   Anemia of chronic disease -Hemoglobin is stable currently around 11.0.  Continue to monitor   Chronic atrial fibrillation -Patient is on amiodarone and Eliquis twice daily.   Congestive heart failure with reduced ejection fraction, 20% -Home torsemide is currently on hold.  Echocardiogram in March 2023 showed EF 20%, severe LV dysfunction and hypokinesia.  Dilated cardiomyopathy.  Patient has been on midodrine due to concerns of hypoxemia, not on any antihypertensive such as Coreg.   Given his comorbidities he overall has poor prognosis but at this time patient wishes to pursue dialysis per renal team.  I have been concerned about how he will tolerate hemodialysis in the setting of severe CHF   Discharge Planning- Ongoing Family Contact- Talked with grandson Energy manager by phone IDT- Updated Goals of care - Ongoing, unclear at this time [name], Therapist, sports, BSN, [facility] Liaison [phone]

## 2022-01-06 NOTE — Progress Notes (Addendum)
Received patient in bed, alert and oriented. Informed consent signed and in chart.  Time tx completed: 1 hour and 53 minutes  HD treatment completed. Pt with altered level of conscious toward end of session while UF removal off, placed pt on  NRB face mask, pt with blank gaze non verbal unable to follow commands LUE falls to bed rr unlabored VSS cbg 127.  Pt became somewhat verbal to express chest pain but speech incomprehensible for the most part. Called Dr Jonnie Finner and Rapid response nurse to inform of pt aloc. Will order ekg and end HD session per md verbal order. Rapid response nurse to bedside to assess. Fistula/Graft without signs and symptoms of complications. Pt with level of conscious back to baseline within minutes of stopping HD, Dr Jonnie Finner to bedside. Patient transported to 6E26  on cardiac monitor and oxygen 2 liter nasal cannula alert and oriented and in no acute distress. Report given to bedside RN. Treatment session ended 37 minutes early.  Total UF removed: 300 ml  Medication given: none  Post HD VS: BP 122/63 MAP 78 HR 76 Sat 100 on 2 liters nasal cannula RR 12   Post HD weight: 61.7 kg

## 2022-01-06 NOTE — Discharge Instructions (Addendum)
Your work-up today was reassuring.  CT scan of the head was stable.  CT scan of your belly did not show any concerning findings.  Urine did not show any evidence of UTI.  He did not have any episode of throwing up in the emergency room.  Care plan was discussed with her grandson over the phone who was in agreement with this.  Please follow-up with your primary care provider.  Your kidney function has been elevated before and you are recommended dialysis which you declined previously and states that she would not want to have this done in the future either.

## 2022-01-06 NOTE — Significant Event (Signed)
Rapid Response Event Note   Reason for Call :  Altered Mental Status C/O chest pain  Initial Focused Assessment:  Patient with decreased LOC, generally weak. Lung sounds clear Heart tones regular  BP 122/63  SR 76   RR 14  O2 sat 100% on NRB  Dr Jonnie Finner came to bedside to assess patient This is patient's 1st dialysis session.   Interventions:  HD stopped early Patient's Mental statu improved rapidly: Alert and conversant O2 changed to 2L [place]  O2 sat 99%  12 lead EKG done  Plan of Care:     Event Summary:   MD Notified: [place] Call Time: [place] Time: [place] End Time: [place]  Raliegh Ip, RN

## 2022-01-07 DIAGNOSIS — E875 Hyperkalemia: Secondary | ICD-10-CM | POA: Diagnosis not present

## 2022-01-07 LAB — RENAL FUNCTION PANEL
Albumin: 3.1 g/dL — ABNORMAL LOW (ref 3.5–5.0)
Anion gap: 14 (ref 5–15)
BUN: 122 mg/dL — ABNORMAL HIGH (ref 8–23)
CO2: 27 mmol/L (ref 22–32)
Calcium: 9.8 mg/dL (ref 8.9–10.3)
Chloride: 94 mmol/L — ABNORMAL LOW (ref 98–111)
Creatinine, Ser: 12.93 mg/dL — ABNORMAL HIGH (ref 0.61–1.24)
GFR, Estimated: 3 mL/min — ABNORMAL LOW (ref >60)
Glucose, Bld: 106 mg/dL — ABNORMAL HIGH (ref 70–99)
Phosphorus: 5.9 mg/dL — ABNORMAL HIGH (ref 2.5–4.6)
Potassium: 5.2 mmol/L — ABNORMAL HIGH (ref 3.5–5.1)
Sodium: 135 mmol/L (ref 135–145)

## 2022-01-07 LAB — GLUCOSE, CAPILLARY: Glucose-Capillary: 127 mg/dL — ABNORMAL HIGH (ref 70–99)

## 2022-01-07 MED ORDER — AMIODARONE HCL 200 MG PO TABS: 200.0000 mg | ORAL_TABLET | Freq: Every day | ORAL | 0 refills | Status: DC

## 2022-01-07 MED ORDER — SODIUM ZIRCONIUM CYCLOSILICATE 10 G PO PACK
10.0000 g | PACK | Freq: Once | ORAL | Status: AC
  Administered 2022-01-07: 10 g via ORAL
  Filled 2022-01-07: qty 1

## 2022-01-07 NOTE — Progress Notes (Signed)
Daily Progress Note   Patient Name: Anthony Foster       Date: 01/07/2022 DOB: [birth year]  Age: 81 y.o. MRN#: [id number] Attending Physician: Damita Lack, MD Primary Care Physician: Wenda Low, MD Admit Date: 01/05/2022  Reason for Consultation/Follow-up: Establishing goals of care  Subjective: Medical records reviewed including progress notes, labs. Patient assessed at the bedside. He reports feeling cold and tells me his grandson has already spoken with his doctor about the plan to avoid any further HD and return home. He is at peace with this decision. He tells me he is worried about losing his VA benefits while on hospice. Attempted to explore further and offer reassurance.  I then called patient's grandson [name] to continue goals of care conversation and provide updates on the above. He confirms with me that Taiki has expressed today he does not want any more HD. We discussed his thoughts on continuing hospice support, as patient did not indicate a preference and is deferring decisions to his grandson. [name] agrees that continuing hospice support would be most appropriate and beneficial.  We discussed thoughts on code status and [name] tells me patient is unable to consider DNR order at this time. He understands that hospice will continue the conversation and rationale for recommendation of DNR in patient's situation. He requests this PA to follow up on timing of discharge.  Questions and concerns addressed. PMT will continue to support holistically.   Length of Stay: 1   Physical Exam Vitals and nursing note reviewed.  Constitutional:      General: He is not in acute distress.    Appearance: He is ill-appearing.  Cardiovascular:     Rate and Rhythm: Normal rate.   Pulmonary:     Effort: Pulmonary effort is normal.  Neurological:     Mental Status: He is alert. Mental status is at baseline.  Psychiatric:        Mood and Affect: Mood normal.             Vital Signs: BP (!) 109/51 (BP Location: Left Arm)   Pulse 75   Temp 98 F (36.7 C) (Oral)   Resp 17   Wt 61.1 kg   SpO2 100%   BMI 20.48 kg/m  SpO2: SpO2: 100 % O2 Device: O2 Device: Nasal Cannula O2 Flow  Rate: O2 Flow Rate (L/min): 2 L/min      Palliative Assessment/Data: 20-30%      Palliative Care Assessment & Plan   Patient Profile: 81 y.o. male  with past medical history of stage V CKD but not on hemodialysis, paroxysmal atrial fibrillation chronically anticoagulated on Eliquis, chronic systolic heart failure status post ICD placement presented to ED on 01/05/22 from home with complains of ongoing nausea and vomiting. Patient has been enrolled in hospice since March 2023 with a terminal diagnosis of hypertensive heart disease and chronic kidney disease for where he has been adamant about not pursuing HD. After Nephrology consult, patient is now agreeable to a trial of HD. Patient was admitted on 01/05/2022 for urgent hemodialysis with hyperkalemia, CKD V now ESRD, nausea/vomiting, COVID 19.   Assessment: Goals of care conversation CKD V Hyperkalemia COVID19 infection  Recommendations/Plan: Continue full code - patient is not ready for DNR and will need ongoing conversation/support from hospice Goal is to discharge home with hospice today Psychosocial and emotional support provided PMT will continue to follow and support as needed   Prognosis:  Weeks  Discharge Planning: Home with Hospice  Care plan was discussed with patient, patient's grandson, RN, [facility], [facility] hospital liaison, Dr. Reesa Chew and Dr. Candiss Norse   MDM [place]         Liboria Putnam Johnnette Litter, PA-C  Palliative Medicine Team Team phone # [phone]  Thank you for allowing the Palliative Medicine Team to assist in the  care of this patient. Please utilize secure chat with additional questions, if there is no response within 30 minutes please call the above phone number.  Palliative Medicine Team providers are available by phone from 7am to 7pm daily and can be reached through the team cell phone.  Should this patient require assistance outside of these hours, please call the patient's attending physician.

## 2022-01-07 NOTE — TOC Initial Note (Addendum)
Transition of Care [facility]) - Initial/Assessment Note    Patient Details  Name: Anthony Foster MRN: [id number] Date of Birth: 1941-02-04  Transition of Care [facility]) CM/SW Contact:    Bethena Roys, RN Phone Number: 01/07/2022, 11:22 AM  Clinical Narrative: Case Manager received a secure chat from Palliative PA. Patient wants to return home and not continue HD. [facility] is following the patient for outpatient Hospice Services. No further needs identified by Case Manager at this time.    1212 01-07-22 Case Manager spoke with the patients grandson and he is agreeable to patient discharging home via [facility]. Case Manager did cal [facility] to place the patient on the schedule. Staff RN aware to call the grandson regarding AVS information and once the patient is en route to home. No further home needs identified.    Expected Discharge Plan: Home w Hospice Care Barriers to Discharge: No Barriers Identified   Patient Goals and CMS Choice Patient states their goals for this hospitalization and ongoing recovery are:: to return home.      Expected Discharge Plan and Services Expected Discharge Plan: Home w Hospice Care In-house Referral: Hospice / Palliative Care Discharge Planning Services: CM Consult Post Acute Care Choice: NA Living arrangements for the past 2 months: Single Family Home                 DME Arranged: N/A         HH Arranged: RN HH Agency: Other - See comment Scientist, physiological)        Prior Living Arrangements/Services Living arrangements for the past 2 months: Single Family Home   Patient language and need for interpreter reviewed:: Yes Do you feel safe going back to the place where you live?: Yes      Need for Family Participation in Patient Care: Yes (Comment) Care giver support system in place?: Yes (comment)   Criminal Activity/Legal Involvement Pertinent to Current Situation/Hospitalization: No - Comment as  needed  Activities of Daily Living      Permission Sought/Granted Permission sought to share information with : Family Supports, Customer service manager, Case Manager Permission granted to share information with : Yes, Verbal Permission Granted     Permission granted to share info w AGENCY: [facility]       Alcohol / Substance Use: Not Applicable Psych Involvement: No (comment)  Admission diagnosis:  Hyperkalemia [E87.5] AKI (acute kidney injury) ([place]) [N17.9] Nausea and vomiting, unspecified vomiting type [R11.2] Patient Active Problem List   Diagnosis Date Noted   Hyperkalemia 01/06/2022   Nausea & vomiting 01/06/2022   Incidental finding of COVID-19 virus infection 63/33/5456   Chronic systolic CHF (congestive heart failure) ([place]) 01/06/2022   AKI (acute kidney injury) ([place]) 01/06/2022   Acute on chronic combined systolic and diastolic CHF (congestive heart failure) ([place]) 08/24/2021   Thrombocytopenia ([place]) 08/24/2021   AAA (abdominal aortic aneurysm) ([place]) 08/24/2021   Tobacco use 08/24/2021   Chest pain with moderate risk of acute coronary syndrome 01/14/2018   Chronic anticoagulation 01/14/2018   Acute renal failure superimposed on stage 5 chronic kidney disease, not on chronic dialysis ([place]) 01/14/2018   Ischemic cardiomyopathy 01/14/2018   CAD S/P percutaneous coronary angioplasty 09/12/2015   Renal insufficiency 09/12/2015   Essential hypertension 09/12/2015   Hyperlipidemia 09/12/2015   CHF (congestive heart failure) ([place]) 07/07/2015   Right knee pain 07/07/2015   Pedal edema 07/07/2015   Fluid overload 07/07/2015   PAF (paroxysmal atrial fibrillation) ([facility]) on chronic anticoagulation  07/07/2015   Gout 07/07/2015   PCP:  Wenda Low, MD Pharmacy:   [place], [place] [address] [place] [address] Phone: [phone] Fax: [phone]  [facility] [id number] - [place], [place] - [address] [place] [address] Phone: [phone] Fax: [phone]  [facility] [address] [place] [address] Phone: [phone] Fax: [phone]     Readmission Risk Interventions    01/07/2022   11:19 AM  Readmission Risk Prevention Plan  Transportation Screening Complete  PCP or Specialist Appt within 3-5 Days Complete  HRI or Home Care Consult Complete  Social Work Consult for [place] Planning/Counseling Complete  Palliative Care Screening Complete  Medication Review Press photographer) Referral to Pharmacy

## 2022-01-07 NOTE — Progress Notes (Signed)
[facility] Progress Note    Assessment/ Plan:   CKD5, now ESRD -h/o RUE AVF 2020. uremic on presentation therefore started on HD, first treatment on 7/16. Did not tolerate HD 7/16. At this junction, patient does not want to do further dialysis. Have discussed this with his grandson [name] over the phone today who agrees with patient's decision. Patient wishes to go back onto hospice. I do agree with patient especially as he will not likely tolerate long term HD in the context of severe CHF on chronic midodrine already  Hyperkalemia -agree with lokelma  COVID-19 positive -incidental finding 12/30/21  Chronic afib -on amio and eliquis  CHF, dilated cardiomyopathy, EF 20% -on chronic midodrine  AOCD -transfused for hgb < 7 -hgb has been in the 11's  Patient to be discharged back home to hospice. Patient wishes to not be on hemodialysis. Discussed w/ primary service, palliative care, and patient's grandson [name]. Nothing to offer at this time from a nephrology perspective. Will sign off. Please call with any questions/concerns.  Gean Quint, MD [facility]   Subjective:   AMS on HD yesterday, had to stop early, rapid response called, AMS improved. Patient refused further dialysis.   Objective:   BP (!) 117/53 (BP Location: Left Arm)   Pulse 71   Temp 98.1 F (36.7 C) (Oral)   Resp 17   Wt 61.1 kg   SpO2 100%   BMI 20.48 kg/m   Intake/Output Summary (Last 24 hours) at 01/07/2022 1212 Last data filed at 01/07/2022 1000 Gross per 24 hour  Intake 718 ml  Output 600 ml  Net 118 ml   Weight change:   Physical Exam: HYQ:MVHQIONGEXB ill appearing, NAD CVS:RRR Resp:fine crackles bibasilar, no inc'd WOB MWU:XLKG Ext:no sig edema Neuro: awake/alert, tremulous w/ jerking movements Dialysis acess: RUE AVF +b/t  Imaging: CT ABDOMEN PELVIS WO CONTRAST  Result Date: 01/05/2022 CLINICAL DATA:  Acute abdominal pain EXAM: CT ABDOMEN AND PELVIS  WITHOUT CONTRAST TECHNIQUE: Multidetector CT imaging of the abdomen and pelvis was performed following the standard protocol without IV contrast. Unenhanced CT was performed per clinician order. Lack of IV contrast limits sensitivity and specificity, especially for evaluation of abdominal/pelvic solid viscera. RADIATION DOSE REDUCTION: This exam was performed according to the departmental dose-optimization program which includes automated exposure control, adjustment of the mA and/or kV according to patient size and/or use of iterative reconstruction technique. COMPARISON:  12/30/2021 FINDINGS: Lower chest: No acute pleural or parenchymal lung disease. Hepatobiliary: High attenuation material within the gallbladder may reflect sludge or vicarious excretion of previously administered contrast. No calcified gallstones or evidence of cholecystitis. Unremarkable unenhanced appearance of the liver. Pancreas: Unremarkable unenhanced appearance. Spleen: Unremarkable unenhanced appearance. Adrenals/Urinary Tract: There is bilateral renal cortical atrophy, with retention of previously administered contrast within the renal parenchyma, consistent with end-stage renal disease. Stable simple cyst upper pole right kidney. No urinary tract calculi or obstructive uropathy. High attenuation material within the bladder lumen consistent with excretion of previously administered contrast. The adrenals are stable. Stomach/Bowel: No bowel obstruction or ileus. Normal appendix right lower quadrant. No bowel wall thickening or inflammatory change. Vascular/Lymphatic: Stable fusiform infrarenal abdominal aortic aneurysm measuring up to 3.9 cm. Bilateral common iliac artery aneurysms, measuring 2 cm on the right and 2.3 cm on the left. Evaluation of the aortic lumen is limited without IV contrast. No evidence of aneurysm leak or rupture. Diffuse atherosclerosis is again noted. No pathologic adenopathy. Reproductive: Stable enlargement of  the prostate. Other:  No free fluid or free intraperitoneal gas. Stable fat containing right inguinal hernia. Musculoskeletal: No acute or destructive bony lesions. Reconstructed images demonstrate no additional findings. IMPRESSION: 1. No acute intra-abdominal or intrapelvic process. 2. 3.9 cm infrarenal abdominal aortic aneurysm. Recommend follow-up every 2 years. Reference: J Am Coll Radiol 6720;94:709-628. 3. Sequela of chronic renal insufficiency, with retention of contrast within the renal parenchyma, delayed excretion of contrast in the bladder, and vicarious excretion of contrast within the gallbladder. 4. Stable enlarged prostate. 5. Stable fat containing right inguinal hernia. 6.  Aortic Atherosclerosis (ICD10-I70.0). Electronically Signed   By: Randa Ngo M.D.   On: 01/05/2022 23:24   CT Head Wo Contrast  Result Date: 01/05/2022 CLINICAL DATA:  Head trauma. EXAM: CT HEAD WITHOUT CONTRAST TECHNIQUE: Contiguous axial images were obtained from the base of the skull through the vertex without intravenous contrast. RADIATION DOSE REDUCTION: This exam was performed according to the departmental dose-optimization program which includes automated exposure control, adjustment of the mA and/or kV according to patient size and/or use of iterative reconstruction technique. COMPARISON:  Head CT dated 12/30/2021. FINDINGS: Brain: Mild age-related atrophy and chronic microvascular ischemic changes. There is no acute intracranial hemorrhage. No mass effect or midline shift. No extra-axial fluid collection. Vascular: No hyperdense vessel or unexpected calcification. Skull: Normal. Negative for fracture or focal lesion. Sinuses/Orbits: No acute finding. Other: None IMPRESSION: 1. No acute intracranial pathology. 2. Mild age-related atrophy and chronic microvascular ischemic changes. Electronically Signed   By: Anner Crete M.D.   On: 01/05/2022 23:24    Labs: BMET Recent Labs  Lab 01/05/22 2355  01/06/22 0400 01/07/22 0421  NA 135 136 135  K 6.8* 6.0* 5.2*  CL 94* 94* 94*  CO2 21* 17* 27  GLUCOSE 94 133* 106*  BUN 204* 209* 122*  CREATININE 18.42* 18.08* 12.93*  CALCIUM 10.5* 10.4* 9.8  PHOS  --  7.3* 5.9*   CBC Recent Labs  Lab 01/05/22 2248 01/06/22 0400  WBC 9.7 8.9  NEUTROABS 7.7 6.6  HGB 12.0* 11.3*  HCT 35.9* 32.6*  MCV 81.8 78.9*  PLT 147* 142*    Medications:     amiodarone  200 mg Oral BID   apixaban  2.5 mg Oral BID   atorvastatin  80 mg Oral Daily   Chlorhexidine Gluconate Cloth  6 each Topical Q0600   Chlorhexidine Gluconate Cloth  6 each Topical Q0600   ferric citrate  210 mg Oral TID WC   midodrine  10 mg Oral BID WC   sodium zirconium cyclosilicate  10 g Oral Once      Gean Quint, MD [facility] 01/07/2022, 12:12 PM

## 2022-01-07 NOTE — Plan of Care (Signed)

## 2022-01-07 NOTE — Procedures (Signed)
HD Note  Patient refused dialysis.  He stated that he just couldn't go thru it again. Staff nurse was in the room when he refused.

## 2022-01-07 NOTE — Discharge Summary (Signed)
Physician Discharge Summary  Anthony Foster KNL:[id number] DOB: 30-Mar-1941 DOA: 01/05/2022  PCP: Wenda Low, MD  Admit date: 01/05/2022 Discharge date: 01/07/2022  Admitted From: Home Disposition: Home  Recommendations for Outpatient Follow-up:  Follow up with PCP in 1-2 weeks Please obtain BMP/CBC in one week your next doctors visit.  Amiodarone reduced to 200 mg daily to adjust for renal dysfunction.  Discussed with pharmacist Okay to continue Eliquis low-dose twice daily Patient is clear he does not want hemodialysis but wants to remain full code.  He accepts to go home on hospice  Discharge Condition: Stable CODE STATUS: Full code Diet recommendation: Heart healthy  Brief/Interim Summary: 81 year old with history of CKD stage V, paroxysmal A-fib on Eliquis, systolic CHF status post ICD placement admitted to the hospital for hyperkalemia, nausea and vomiting.  Upon admission patient noted to be hyperkalemic with a potassium of 6.8, creatinine 18.4, BUN 204.  EKG did not show any peaked T waves.  CT abdomen pelvis was negative for acute pathology, nephrology team was consulted.  Apparently patient has also been on hospice care service and wishes to be full code and wanted to pursue dialysis initially.  Patient underwent 1 HD ession but during the session he did not feel well and had an episode of hypotension even after his session.  The following day patient decided not to pursue hemodialysis any further understanding the risk.  Multiple providers including nephrology, myself, palliative care and hospice team spoke with the patient and his grandson Anthony Foster at patient's request.  Final determination was made to transition patient back to hospice but he remains full code.        Hyperkalemia - Potassium level has improved after getting a session of dialysis.  Today is still 5.2, will give 1 dose of Lokelma prior to discharging him home.   CKD stage V now ESRD. - Baseline  creatinine 5, admission creatinine 18.  Patient did receive 1 session of hemodialysis in the hospital.  But now has decided against any further HD.  He will be discharged home.   Nausea vomiting - Suspect from uremia and possible COVID-19 from about a week ago.  Resolved this morning.  o.   COVID-19 infection - Incidental finding on 7/9 as outpatient.  Supportive measures at this time.   Anemia of chronic disease -Hemoglobin is stable currently around 11.0.  Continue to monitor   Chronic atrial fibrillation -Patient is on amiodarone and Eliquis twice daily.  Amiodarone has been reduced to 200 mg daily.  Continue low-dose Eliquis.   Congestive heart failure with reduced ejection fraction, 20% -Home torsemide is currently on hold.  Echocardiogram in March 2023 showed EF 20%, severe LV dysfunction and hypokinesia.  Dilated cardiomyopathy.  Patient has been on midodrine due to concerns of hypoxemia, not on any antihypertensive such as Coreg.   Overall patient has very poor prognosis now that he is ESRD and wants to be off dialysis.  Life expectancy probably less than 2 weeks.  Patient wishes to be full code but would like to go home on hospice.  Assessment and Plan: No notes have been filed under this hospital service. Service: Hospitalist      Body mass index is 20.48 kg/m.       Discharge Diagnoses:  Principal Problem:   Hyperkalemia Active Problems:   Acute renal failure superimposed on stage 5 chronic kidney disease, not on chronic dialysis (HCC)   PAF (paroxysmal atrial fibrillation) (HCC) on chronic anticoagulation   Hyperlipidemia  Nausea & vomiting   Incidental finding of COVID-19 virus infection   Chronic systolic CHF (congestive heart failure) ([facility])   AKI (acute kidney injury) [facility])      Consultations: Nephrology Palliative care  Subjective: Seen and examined at bedside.  Patient is alert awake oriented.  He is very clear he does not want any more sessions  of hemodialysis.  He understands that his life expectancy is very poor and less than 2 weeks likely.  Discharge Exam: Vitals:   01/07/22 0747 01/07/22 1135  BP: (!) 109/51 (!) 117/53  Pulse: 75 71  Resp: 17 17  Temp: 98 F (36.7 C) 98.1 F (36.7 C)  SpO2: 100% 100%   Vitals:   01/07/22 0003 01/07/22 0440 01/07/22 0747 01/07/22 1135  BP: (!) 109/48 (!) 112/57 (!) 109/51 (!) 117/53  Pulse: 73 72 75 71  Resp: '16 18 17 17  '$ Temp: 97.7 F (36.5 C) 98.4 F (36.9 C) 98 F (36.7 C) 98.1 F (36.7 C)  TempSrc: Oral Oral Oral Oral  SpO2: 100% 100% 100% 100%  Weight:  61.1 kg      General: Pt is alert, awake, not in acute distress Cardiovascular: RRR, S1/S2 +, no rubs, no gallops Respiratory: CTA bilaterally, no wheezing, no rhonchi Abdominal: Soft, NT, ND, bowel sounds + Extremities: no edema, no cyanosis AV fistula noted  Discharge Instructions   Allergies as of 01/07/2022       Reactions   Hymenoptera Venom Preparations Hives   Reaction to wasps   Lisinopril Other (See Comments)   angioedema   Piroxicam Other (See Comments)   burn   Shellfish Allergy Swelling   Amoxicillin-pot Clavulanate Rash   Did it involve swelling of the face/tongue/throat, SOB, or low BP? No Did it involve sudden or severe rash/hives, skin peeling, or any reaction on the inside of your mouth or nose? Yes Did you need to seek medical attention at a hospital or doctor's office? Yes When did it last happen?      over 10 years If all above answers are "NO", may proceed with cephalosporin use.        Medication List     TAKE these medications    allopurinol 300 MG tablet Commonly known as: ZYLOPRIM Take 300 mg by mouth daily.   amiodarone 200 MG tablet Commonly known as: PACERONE Take 1 tablet (200 mg total) by mouth daily. What changed: when to take this   apixaban 2.5 MG Tabs tablet Commonly known as: Eliquis Take 1 tablet (2.5 mg total) by mouth daily.   atorvastatin 80 MG  tablet Commonly known as: LIPITOR Take 1 tablet (80 mg total) by mouth daily.   docusate sodium 100 MG capsule Commonly known as: COLACE Take 100 mg by mouth daily as needed for mild constipation.   EPINEPHrine 0.3 mg/0.3 mL Soaj injection Commonly known as: EPI-PEN SMARTSIG:1 pre-filled pen syringe IM Once   midodrine 10 MG tablet Commonly known as: PROAMATINE Take 1 tablet (10 mg total) by mouth 2 (two) times daily with a meal.   nitroGLYCERIN 0.4 MG SL tablet Commonly known as: NITROSTAT Place 1 tablet (0.4 mg total) under the tongue every 5 (five) minutes as needed for chest pain.   ondansetron 4 MG tablet Commonly known as: ZOFRAN Take 4 mg by mouth 3 (three) times daily as needed for nausea/vomiting.   ProAir RespiClick 419 (90 Base) MCG/ACT Aepb Generic drug: Albuterol Sulfate Inhale 1 puff into the lungs every 4 (four) hours as needed (  shortness of breath).   senna 8.6 MG Tabs tablet Commonly known as: SENOKOT Take 2 tablets by mouth daily as needed for mild constipation.   Torsemide 40 MG Tabs Take 40 mg by mouth daily.        Follow-up Information     Wenda Low, MD. Schedule an appointment as soon as possible for a visit in 1 week(s).   Specialty: Internal Medicine Contact information: [address] [place] [address] [phone]                Allergies  Allergen Reactions   Hymenoptera Venom Preparations Hives    Reaction to wasps   Lisinopril Other (See Comments)    angioedema   Piroxicam Other (See Comments)    burn   Shellfish Allergy Swelling   Amoxicillin-Pot Clavulanate Rash    Did it involve swelling of the face/tongue/throat, SOB, or low BP? No Did it involve sudden or severe rash/hives, skin peeling, or any reaction on the inside of your mouth or nose? Yes Did you need to seek medical attention at a hospital or doctor's office? Yes When did it last happen?      over 10 years If all above answers are  "NO", may proceed with cephalosporin use.     You were cared for by a hospitalist during your hospital stay. If you have any questions about your discharge medications or the care you received while you were in the hospital after you are discharged, you can call the unit and asked to speak with the hospitalist on call if the hospitalist that took care of you is not available. Once you are discharged, your primary care physician will handle any further medical issues. Please note that no refills for any discharge medications will be authorized once you are discharged, as it is imperative that you return to your primary care physician (or establish a relationship with a primary care physician if you do not have one) for your aftercare needs so that they can reassess your need for medications and monitor your lab values.   Procedures/Studies: CT ABDOMEN PELVIS WO CONTRAST  Result Date: 01/05/2022 CLINICAL DATA:  Acute abdominal pain EXAM: CT ABDOMEN AND PELVIS WITHOUT CONTRAST TECHNIQUE: Multidetector CT imaging of the abdomen and pelvis was performed following the standard protocol without IV contrast. Unenhanced CT was performed per clinician order. Lack of IV contrast limits sensitivity and specificity, especially for evaluation of abdominal/pelvic solid viscera. RADIATION DOSE REDUCTION: This exam was performed according to the departmental dose-optimization program which includes automated exposure control, adjustment of the mA and/or kV according to patient size and/or use of iterative reconstruction technique. COMPARISON:  12/30/2021 FINDINGS: Lower chest: No acute pleural or parenchymal lung disease. Hepatobiliary: High attenuation material within the gallbladder may reflect sludge or vicarious excretion of previously administered contrast. No calcified gallstones or evidence of cholecystitis. Unremarkable unenhanced appearance of the liver. Pancreas: Unremarkable unenhanced appearance. Spleen:  Unremarkable unenhanced appearance. Adrenals/Urinary Tract: There is bilateral renal cortical atrophy, with retention of previously administered contrast within the renal parenchyma, consistent with end-stage renal disease. Stable simple cyst upper pole right kidney. No urinary tract calculi or obstructive uropathy. High attenuation material within the bladder lumen consistent with excretion of previously administered contrast. The adrenals are stable. Stomach/Bowel: No bowel obstruction or ileus. Normal appendix right lower quadrant. No bowel wall thickening or inflammatory change. Vascular/Lymphatic: Stable fusiform infrarenal abdominal aortic aneurysm measuring up to 3.9 cm. Bilateral common iliac artery aneurysms, measuring  2 cm on the right and 2.3 cm on the left. Evaluation of the aortic lumen is limited without IV contrast. No evidence of aneurysm leak or rupture. Diffuse atherosclerosis is again noted. No pathologic adenopathy. Reproductive: Stable enlargement of the prostate. Other: No free fluid or free intraperitoneal gas. Stable fat containing right inguinal hernia. Musculoskeletal: No acute or destructive bony lesions. Reconstructed images demonstrate no additional findings. IMPRESSION: 1. No acute intra-abdominal or intrapelvic process. 2. 3.9 cm infrarenal abdominal aortic aneurysm. Recommend follow-up every 2 years. Reference: J Am Coll Radiol 6812;75:170-017. 3. Sequela of chronic renal insufficiency, with retention of contrast within the renal parenchyma, delayed excretion of contrast in the bladder, and vicarious excretion of contrast within the gallbladder. 4. Stable enlarged prostate. 5. Stable fat containing right inguinal hernia. 6.  Aortic Atherosclerosis (ICD10-I70.0). Electronically Signed   By: Randa Ngo M.D.   On: 01/05/2022 23:24   CT Head Wo Contrast  Result Date: 01/05/2022 CLINICAL DATA:  Head trauma. EXAM: CT HEAD WITHOUT CONTRAST TECHNIQUE: Contiguous axial images were  obtained from the base of the skull through the vertex without intravenous contrast. RADIATION DOSE REDUCTION: This exam was performed according to the departmental dose-optimization program which includes automated exposure control, adjustment of the mA and/or kV according to patient size and/or use of iterative reconstruction technique. COMPARISON:  Head CT dated 12/30/2021. FINDINGS: Brain: Mild age-related atrophy and chronic microvascular ischemic changes. There is no acute intracranial hemorrhage. No mass effect or midline shift. No extra-axial fluid collection. Vascular: No hyperdense vessel or unexpected calcification. Skull: Normal. Negative for fracture or focal lesion. Sinuses/Orbits: No acute finding. Other: None IMPRESSION: 1. No acute intracranial pathology. 2. Mild age-related atrophy and chronic microvascular ischemic changes. Electronically Signed   By: Anner Crete M.D.   On: 01/05/2022 23:24   CT HEAD WO CONTRAST (5MM)  Result Date: 12/30/2021 CLINICAL DATA:  Head trauma EXAM: CT HEAD WITHOUT CONTRAST TECHNIQUE: Contiguous axial images were obtained from the base of the skull through the vertex without intravenous contrast. RADIATION DOSE REDUCTION: This exam was performed according to the departmental dose-optimization program which includes automated exposure control, adjustment of the mA and/or kV according to patient size and/or use of iterative reconstruction technique. COMPARISON:  CT brain 12/30/2021 FINDINGS: Brain: No acute territorial infarction or intracranial mass is visualized. Trace amounts of subarachnoid blood along the left frontal sulci without significant change. Atrophy. Mild chronic small vessel ischemic changes of the white matter. Nonenlarged ventricles. Vascular: Mildly dense venous sinuses likely due to previous contrast administration. Carotid vascular calcification. Skull: Normal. Negative for fracture or focal lesion. Sinuses/Orbits: No acute finding. Other:  Small right forehead scalp hematoma IMPRESSION: 1. Similar trace subarachnoid blood within left frontal sulci. No new foci of hemorrhage. 2. Atrophy Electronically Signed   By: Donavan Foil M.D.   On: 12/30/2021 19:58   CT CERVICAL SPINE WO CONTRAST  Result Date: 12/30/2021 CLINICAL DATA:  Trauma. EXAM: CT CERVICAL SPINE WITHOUT CONTRAST TECHNIQUE: Multidetector CT imaging of the cervical spine was performed without intravenous contrast. Multiplanar CT image reconstructions were also generated. RADIATION DOSE REDUCTION: This exam was performed according to the departmental dose-optimization program which includes automated exposure control, adjustment of the mA and/or kV according to patient size and/or use of iterative reconstruction technique. COMPARISON:  None Available. FINDINGS: Alignment: No traumatic malalignment. Skull base and vertebrae: No acute fracture. No primary bone lesion or focal pathologic process. Soft tissues and spinal canal: No prevertebral fluid or swelling. No visible canal  hematoma. Disc levels: Mild multilevel disc height loss and uncovertebral hypertrophy. Moderate to severe left-sided facet arthropathy at C3-C4 and C4-C5. Bilateral C2-C3 facet ankylosis. Upper chest: Please see separate chest CT report from same day. Other: None. IMPRESSION: 1. No acute fracture or traumatic malalignment of the cervical spine. Electronically Signed   By: Titus Dubin M.D.   On: 12/30/2021 13:01   CT CHEST ABDOMEN PELVIS W CONTRAST  Result Date: 12/30/2021 CLINICAL DATA:  Trauma. EXAM: CT CHEST, ABDOMEN, AND PELVIS WITH CONTRAST TECHNIQUE: Multidetector CT imaging of the chest, abdomen and pelvis was performed following the standard protocol during bolus administration of intravenous contrast. RADIATION DOSE REDUCTION: This exam was performed according to the departmental dose-optimization program which includes automated exposure control, adjustment of the mA and/or kV according to patient size  and/or use of iterative reconstruction technique. CONTRAST:  72m OMNIPAQUE IOHEXOL 350 MG/ML SOLN COMPARISON:  Chest x-ray from same day. CT abdomen pelvis dated August 24, 2021. FINDINGS: CT CHEST FINDINGS Cardiovascular: No significant vascular findings. Normal heart size. No pericardial effusion. No thoracic aortic aneurysm or dissection. Coronary, aortic arch, and branch vessel atherosclerotic vascular disease. No central pulmonary embolism. Left chest wall pacemaker. Mediastinum/Nodes: No enlarged mediastinal, hilar, or axillary lymph nodes. Thyroid gland and esophagus demonstrate no significant findings. Lungs/Pleura: Small amount of secretions in the trachea and right mainstem bronchus. No focal consolidation, pleural effusion, or pneumothorax. Mild centrilobular emphysema. Musculoskeletal: No acute or significant osseous findings. CT ABDOMEN PELVIS FINDINGS Hepatobiliary: No hepatic injury or perihepatic hematoma. Layering gallbladder sludge. No biliary dilatation. Pancreas: Unremarkable. No pancreatic ductal dilatation or surrounding inflammatory changes. Spleen: No splenic injury or perisplenic hematoma. Adrenals/Urinary Tract: No adrenal hemorrhage or renal injury identified. Unchanged left-greater-than-right renal atrophy and bilateral renal simple cysts. No follow-up imaging is recommended. No renal calculi or hydronephrosis. Bladder is unremarkable for the degree of distention. Stomach/Bowel: Small hiatal hernia. The stomach is otherwise within normal limits. No bowel wall thickening, distention, or surrounding inflammatory changes. Normal appendix. Vascular/Lymphatic: Fusiform dilatation of the infrarenal abdominal aorta measuring up to 4.0 cm, unchanged. 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 Aortoiliac atherosclerotic vascular disease. The left internal iliac artery is occluded proximally with distal reconstitution. Reproductive:  Unchanged mild prostatomegaly. Other: Unchanged small fat containing right inguinal hernia. No free fluid or pneumoperitoneum. Musculoskeletal: No acute or significant osseous findings. IMPRESSION: 1. No acute traumatic injury in the chest, abdomen, or pelvis. 2. Unchanged 4.0 cm infrarenal abdominal aortic aneurysm. Recommend follow-up every 12 months and vascular consultation. This recommendation follows ACR consensus guidelines: White Paper of the ACR Incidental Findings Committee II on Vascular Findings. J Am Coll Radiol 2013; 10:789-794. 3. Aortic Atherosclerosis (ICD10-I70.0) and Emphysema (ICD10-J43.9). Electronically Signed   By: WTitus DubinM.D.   On: 12/30/2021 12:58   CT HEAD WO CONTRAST  Result Date: 12/30/2021 CLINICAL DATA:  Head trauma, moderate-severe EXAM: CT HEAD WITHOUT CONTRAST TECHNIQUE: Contiguous axial images were obtained from the base of the skull through the vertex without intravenous contrast. RADIATION DOSE REDUCTION: This exam was performed according to the departmental dose-optimization program which includes automated exposure control, adjustment of the mA and/or kV according to patient size and/or use of iterative reconstruction technique. COMPARISON:  None Available. CT head 12/28/2015. FINDINGS: Brain: Trace acute subarachnoid hemorrhage along the left frontal convexity (series 3, image 15; series 5, image 15). No evidence of acute large vascular territory infarct, mass lesion, midline shift, or hydrocephalus. Vascular: No hyperdense  vessel identified. Calcific atherosclerosis. Skull: No acute fracture.  Right forehead contusion. Sinuses/Orbits: Visualized sinuses are clear. No acute orbital findings. Other: No mastoid effusions. IMPRESSION: 1. Trace acute subarachnoid hemorrhage along the left frontal convexity. 2. Right forehead contusion without acute calvarial fracture. Findings discussed with Dr. Armandina Gemma via telephone at 12:46 PM. Electronically Signed   By: Margaretha Sheffield M.D.   On: 12/30/2021 12:48   DG Pelvis Portable  Result Date: 12/30/2021 CLINICAL DATA:  Fall.  Patient on blood thinners. EXAM: PORTABLE PELVIS 1-2 VIEWS COMPARISON:  None Available. FINDINGS: No fracture or bone lesion. Mild medial hip joint space narrowing. Bony prominence from the acetabular rims. SI joints and symphysis pubis are normally spaced and aligned. Aortoiliac and femoral artery vascular calcifications. IMPRESSION: 1. No fracture or acute finding. Electronically Signed   By: Lajean Manes M.D.   On: 12/30/2021 11:43   DG Chest Port 1 View  Result Date: 12/30/2021 CLINICAL DATA:  Fall.  Patient on blood thinners. EXAM: PORTABLE CHEST 1 VIEW COMPARISON:  08/24/2021. FINDINGS: Cardiac silhouette normal in size. Stable left anterior chest wall AICD. No mediastinal or hilar masses. Clear lungs.  No pleural effusion or pneumothorax. Skeletal structures are grossly intact. IMPRESSION: No active disease. Electronically Signed   By: Lajean Manes M.D.   On: 12/30/2021 11:42     The results of significant diagnostics from this hospitalization (including imaging, microbiology, ancillary and laboratory) are listed below for reference.     Microbiology: Recent Results (from the past 240 hour(s))  Resp Panel by RT-PCR (Flu A&B, Covid) Anterior Nasal Swab     Status: Abnormal   Collection Time: 12/30/21 12:05 PM   Specimen: Anterior Nasal Swab  Result Value Ref Range Status   SARS Coronavirus 2 by RT PCR POSITIVE (A) NEGATIVE Final    Comment: (NOTE) SARS-CoV-2 target nucleic acids are DETECTED.  The SARS-CoV-2 RNA is generally detectable in upper respiratory specimens during the acute phase of infection. Positive results are indicative of the presence of the identified virus, but do not rule out bacterial infection or co-infection with other pathogens not detected by the test. Clinical correlation with patient history and other diagnostic information is necessary to determine  patient infection status. The expected result is Negative.  Fact Sheet for Patients: EntrepreneurPulse.com.au  Fact Sheet for Healthcare Providers: IncredibleEmployment.be  This test is not yet approved or cleared by the [place] FDA and  has been authorized for detection and/or diagnosis of SARS-CoV-2 by FDA under an Emergency Use Authorization (EUA).  This EUA will remain in effect (meaning this test can be used) for the duration of  the COVID-19 declaration under Section 564(b)(1) of the A ct, 21 U.S.C. section 360bbb-3(b)(1), unless the authorization is terminated or revoked sooner.     Influenza A by PCR NEGATIVE NEGATIVE Final   Influenza B by PCR NEGATIVE NEGATIVE Final    Comment: (NOTE) The Xpert Xpress SARS-CoV-2/FLU/RSV plus assay is intended as an aid in the diagnosis of influenza from Nasopharyngeal swab specimens and should not be used as a sole basis for treatment. Nasal washings and aspirates are unacceptable for Xpert Xpress SARS-CoV-2/FLU/RSV testing.  Fact Sheet for Patients: EntrepreneurPulse.com.au  Fact Sheet for Healthcare Providers: IncredibleEmployment.be  This test is not yet approved or cleared by the [place] FDA and has been authorized for detection and/or diagnosis of SARS-CoV-2 by FDA under an Emergency Use Authorization (EUA). This EUA will remain in effect (meaning this test can be used) for the  duration of the COVID-19 declaration under Section 564(b)(1) of the Act, 21 U.S.C. section 360bbb-3(b)(1), unless the authorization is terminated or revoked.  Performed at [facility], [place] [address] [place], [place] [address]      Labs: BNP (last 3 results) Recent Labs    08/24/21 0145  BNP 5,400.8*   Basic Metabolic Panel: Recent Labs  Lab 01/05/22 2355 01/06/22 0400 01/07/22 0421  NA 135 136 135  K 6.8* 6.0* 5.2*  CL 94* 94* 94*  CO2 21* 17*  27  GLUCOSE 94 133* 106*  BUN 204* 209* 122*  CREATININE 18.42* 18.08* 12.93*  CALCIUM 10.5* 10.4* 9.8  MG  --  2.2  --   PHOS  --  7.3* 5.9*   Liver Function Tests: Recent Labs  Lab 01/05/22 2355 01/06/22 0400 01/07/22 0421  AST 33  --   --   ALT 62*  --   --   ALKPHOS 56  --   --   BILITOT 1.8*  --   --   PROT 6.9  --   --   ALBUMIN 3.4* 3.1* 3.1*   Recent Labs  Lab 01/05/22 2355  LIPASE 49   No results for input(s): "AMMONIA" in the last 168 hours. CBC: Recent Labs  Lab 01/05/22 2248 01/06/22 0400  WBC 9.7 8.9  NEUTROABS 7.7 6.6  HGB 12.0* 11.3*  HCT 35.9* 32.6*  MCV 81.8 78.9*  PLT 147* 142*   Cardiac Enzymes: No results for input(s): "CKTOTAL", "CKMB", "CKMBINDEX", "TROPONINI" in the last 168 hours. BNP: Invalid input(s): "POCBNP" CBG: Recent Labs  Lab 01/06/22 0214 01/06/22 0318 01/06/22 1326  GLUCAP 93 170* 127*   D-Dimer No results for input(s): "DDIMER" in the last 72 hours. Hgb A1c No results for input(s): "HGBA1C" in the last 72 hours. Lipid Profile No results for input(s): "CHOL", "HDL", "LDLCALC", "TRIG", "CHOLHDL", "LDLDIRECT" in the last 72 hours. Thyroid function studies No results for input(s): "TSH", "T4TOTAL", "T3FREE", "THYROIDAB" in the last 72 hours.  Invalid input(s): "FREET3" Anemia work up No results for input(s): "VITAMINB12", "FOLATE", "FERRITIN", "TIBC", "IRON", "RETICCTPCT" in the last 72 hours. Urinalysis    Component Value Date/Time   COLORURINE YELLOW 01/05/2022 2330   APPEARANCEUR HAZY (A) 01/05/2022 2330   LABSPEC 1.014 01/05/2022 2330   PHURINE 5.0 01/05/2022 2330   GLUCOSEU NEGATIVE 01/05/2022 2330   HGBUR NEGATIVE 01/05/2022 2330   BILIRUBINUR NEGATIVE 01/05/2022 2330   KETONESUR NEGATIVE 01/05/2022 2330   PROTEINUR NEGATIVE 01/05/2022 2330   NITRITE NEGATIVE 01/05/2022 2330   LEUKOCYTESUR NEGATIVE 01/05/2022 2330   Sepsis Labs Recent Labs  Lab 01/05/22 2248 01/06/22 0400  WBC 9.7 8.9    Microbiology Recent Results (from the past 240 hour(s))  Resp Panel by RT-PCR (Flu A&B, Covid) Anterior Nasal Swab     Status: Abnormal   Collection Time: 12/30/21 12:05 PM   Specimen: Anterior Nasal Swab  Result Value Ref Range Status   SARS Coronavirus 2 by RT PCR POSITIVE (A) NEGATIVE Final    Comment: (NOTE) SARS-CoV-2 target nucleic acids are DETECTED.  The SARS-CoV-2 RNA is generally detectable in upper respiratory specimens during the acute phase of infection. Positive results are indicative of the presence of the identified virus, but do not rule out bacterial infection or co-infection with other pathogens not detected by the test. Clinical correlation with patient history and other diagnostic information is necessary to determine patient infection status. The expected result is Negative.  Fact Sheet for Patients: EntrepreneurPulse.com.au  Fact  Sheet for Healthcare Providers: IncredibleEmployment.be  This test is not yet approved or cleared by the [place] and  has been authorized for detection and/or diagnosis of SARS-CoV-2 by FDA under an Emergency Use Authorization (EUA).  This EUA will remain in effect (meaning this test can be used) for the duration of  the COVID-19 declaration under Section 564(b)(1) of the A ct, 21 U.S.C. section 360bbb-3(b)(1), unless the authorization is terminated or revoked sooner.     Influenza A by PCR NEGATIVE NEGATIVE Final   Influenza B by PCR NEGATIVE NEGATIVE Final    Comment: (NOTE) The Xpert Xpress SARS-CoV-2/FLU/RSV plus assay is intended as an aid in the diagnosis of influenza from Nasopharyngeal swab specimens and should not be used as a sole basis for treatment. Nasal washings and aspirates are unacceptable for Xpert Xpress SARS-CoV-2/FLU/RSV testing.  Fact Sheet for Patients: EntrepreneurPulse.com.au  Fact Sheet for Healthcare  Providers: IncredibleEmployment.be  This test is not yet approved or cleared by the [place] FDA and has been authorized for detection and/or diagnosis of SARS-CoV-2 by FDA under an Emergency Use Authorization (EUA). This EUA will remain in effect (meaning this test can be used) for the duration of the COVID-19 declaration under Section 564(b)(1) of the Act, 21 U.S.C. section 360bbb-3(b)(1), unless the authorization is terminated or revoked.  Performed at [facility], [place] [address], [place], [place] [address]      Time coordinating discharge:  I have spent 35 minutes face to face with the patient and on the ward discussing the patients care, assessment, plan and disposition with other care givers. >50% of the time was devoted counseling the patient about the risks and benefits of treatment/Discharge disposition and coordinating care.   SIGNED:   Damita Lack, MD  [facility] 01/07/2022, 11:42 AM   If 7PM-7AM, please contact night-coverage

## 2022-01-08 DIAGNOSIS — I714 Abdominal aortic aneurysm, without rupture, unspecified: Secondary | ICD-10-CM | POA: Diagnosis not present

## 2022-01-08 DIAGNOSIS — I132 Hypertensive heart and chronic kidney disease with heart failure and with stage 5 chronic kidney disease, or end stage renal disease: Secondary | ICD-10-CM | POA: Diagnosis not present

## 2022-01-08 DIAGNOSIS — N185 Chronic kidney disease, stage 5: Secondary | ICD-10-CM | POA: Diagnosis not present

## 2022-01-08 DIAGNOSIS — J449 Chronic obstructive pulmonary disease, unspecified: Secondary | ICD-10-CM | POA: Diagnosis not present

## 2022-01-08 DIAGNOSIS — I4891 Unspecified atrial fibrillation: Secondary | ICD-10-CM | POA: Diagnosis not present

## 2022-01-08 DIAGNOSIS — I5040 Unspecified combined systolic (congestive) and diastolic (congestive) heart failure: Secondary | ICD-10-CM | POA: Diagnosis not present

## 2022-01-08 LAB — RENAL FUNCTION PANEL
Albumin: 3.1 g/dL — ABNORMAL LOW (ref 3.5–5.0)
Anion gap: 25 — ABNORMAL HIGH (ref 5–15)
BUN: 209 mg/dL — ABNORMAL HIGH (ref 8–23)
CO2: 17 mmol/L — ABNORMAL LOW (ref 22–32)
Calcium: 10.4 mg/dL — ABNORMAL HIGH (ref 8.9–10.3)
Chloride: 94 mmol/L — ABNORMAL LOW (ref 98–111)
Creatinine, Ser: 18.08 mg/dL — ABNORMAL HIGH (ref 0.61–1.24)
GFR, Estimated: 2 mL/min — ABNORMAL LOW (ref >60)
Glucose, Bld: 133 mg/dL — ABNORMAL HIGH (ref 70–99)
Phosphorus: 7.3 mg/dL — ABNORMAL HIGH (ref 2.5–4.6)
Potassium: 6 mmol/L — ABNORMAL HIGH (ref 3.5–5.1)
Sodium: 136 mmol/L (ref 135–145)

## 2022-01-08 LAB — PTH, INTACT AND CALCIUM
Calcium, Total (PTH): 9.5 mg/dL (ref 8.6–10.2)
PTH: 197 pg/mL — ABNORMAL HIGH (ref 15–65)

## 2022-01-08 LAB — HEPATITIS B SURFACE ANTIBODY, QUANTITATIVE: Hep B S AB Quant (Post): <3.1 m[IU]/mL — ABNORMAL LOW (ref >9.9)

## 2022-01-09 DIAGNOSIS — N185 Chronic kidney disease, stage 5: Secondary | ICD-10-CM | POA: Diagnosis not present

## 2022-01-09 DIAGNOSIS — I4891 Unspecified atrial fibrillation: Secondary | ICD-10-CM | POA: Diagnosis not present

## 2022-01-09 DIAGNOSIS — J449 Chronic obstructive pulmonary disease, unspecified: Secondary | ICD-10-CM | POA: Diagnosis not present

## 2022-01-09 DIAGNOSIS — I714 Abdominal aortic aneurysm, without rupture, unspecified: Secondary | ICD-10-CM | POA: Diagnosis not present

## 2022-01-09 DIAGNOSIS — I5040 Unspecified combined systolic (congestive) and diastolic (congestive) heart failure: Secondary | ICD-10-CM | POA: Diagnosis not present

## 2022-01-09 DIAGNOSIS — I132 Hypertensive heart and chronic kidney disease with heart failure and with stage 5 chronic kidney disease, or end stage renal disease: Secondary | ICD-10-CM | POA: Diagnosis not present

## 2022-01-10 DIAGNOSIS — I714 Abdominal aortic aneurysm, without rupture, unspecified: Secondary | ICD-10-CM | POA: Diagnosis not present

## 2022-01-10 DIAGNOSIS — J449 Chronic obstructive pulmonary disease, unspecified: Secondary | ICD-10-CM | POA: Diagnosis not present

## 2022-01-10 DIAGNOSIS — N185 Chronic kidney disease, stage 5: Secondary | ICD-10-CM | POA: Diagnosis not present

## 2022-01-10 DIAGNOSIS — I4891 Unspecified atrial fibrillation: Secondary | ICD-10-CM | POA: Diagnosis not present

## 2022-01-10 DIAGNOSIS — I132 Hypertensive heart and chronic kidney disease with heart failure and with stage 5 chronic kidney disease, or end stage renal disease: Secondary | ICD-10-CM | POA: Diagnosis not present

## 2022-01-10 DIAGNOSIS — I5040 Unspecified combined systolic (congestive) and diastolic (congestive) heart failure: Secondary | ICD-10-CM | POA: Diagnosis not present

## 2022-01-11 DIAGNOSIS — I4891 Unspecified atrial fibrillation: Secondary | ICD-10-CM | POA: Diagnosis not present

## 2022-01-11 DIAGNOSIS — I132 Hypertensive heart and chronic kidney disease with heart failure and with stage 5 chronic kidney disease, or end stage renal disease: Secondary | ICD-10-CM | POA: Diagnosis not present

## 2022-01-11 DIAGNOSIS — I714 Abdominal aortic aneurysm, without rupture, unspecified: Secondary | ICD-10-CM | POA: Diagnosis not present

## 2022-01-11 DIAGNOSIS — J449 Chronic obstructive pulmonary disease, unspecified: Secondary | ICD-10-CM | POA: Diagnosis not present

## 2022-01-11 DIAGNOSIS — I5040 Unspecified combined systolic (congestive) and diastolic (congestive) heart failure: Secondary | ICD-10-CM | POA: Diagnosis not present

## 2022-01-11 DIAGNOSIS — N185 Chronic kidney disease, stage 5: Secondary | ICD-10-CM | POA: Diagnosis not present

## 2022-01-15 DIAGNOSIS — J449 Chronic obstructive pulmonary disease, unspecified: Secondary | ICD-10-CM | POA: Diagnosis not present

## 2022-01-15 DIAGNOSIS — I4891 Unspecified atrial fibrillation: Secondary | ICD-10-CM | POA: Diagnosis not present

## 2022-01-15 DIAGNOSIS — I5040 Unspecified combined systolic (congestive) and diastolic (congestive) heart failure: Secondary | ICD-10-CM | POA: Diagnosis not present

## 2022-01-15 DIAGNOSIS — I132 Hypertensive heart and chronic kidney disease with heart failure and with stage 5 chronic kidney disease, or end stage renal disease: Secondary | ICD-10-CM | POA: Diagnosis not present

## 2022-01-15 DIAGNOSIS — N185 Chronic kidney disease, stage 5: Secondary | ICD-10-CM | POA: Diagnosis not present

## 2022-01-15 DIAGNOSIS — I714 Abdominal aortic aneurysm, without rupture, unspecified: Secondary | ICD-10-CM | POA: Diagnosis not present

## 2022-01-17 ENCOUNTER — Ambulatory Visit

## 2022-01-17 ENCOUNTER — Telehealth: Payer: Self-pay

## 2022-01-17 ENCOUNTER — Ambulatory Visit (INDEPENDENT_AMBULATORY_CARE_PROVIDER_SITE_OTHER)

## 2022-01-17 DIAGNOSIS — N185 Chronic kidney disease, stage 5: Secondary | ICD-10-CM | POA: Diagnosis not present

## 2022-01-17 DIAGNOSIS — I714 Abdominal aortic aneurysm, without rupture, unspecified: Secondary | ICD-10-CM | POA: Diagnosis not present

## 2022-01-17 DIAGNOSIS — I132 Hypertensive heart and chronic kidney disease with heart failure and with stage 5 chronic kidney disease, or end stage renal disease: Secondary | ICD-10-CM | POA: Diagnosis not present

## 2022-01-17 DIAGNOSIS — I255 Ischemic cardiomyopathy: Secondary | ICD-10-CM

## 2022-01-17 DIAGNOSIS — I4891 Unspecified atrial fibrillation: Secondary | ICD-10-CM | POA: Diagnosis not present

## 2022-01-17 DIAGNOSIS — I5040 Unspecified combined systolic (congestive) and diastolic (congestive) heart failure: Secondary | ICD-10-CM | POA: Diagnosis not present

## 2022-01-17 DIAGNOSIS — J449 Chronic obstructive pulmonary disease, unspecified: Secondary | ICD-10-CM | POA: Diagnosis not present

## 2022-01-17 LAB — CUP PACEART REMOTE DEVICE CHECK
Battery Remaining Longevity: 5 mo
Battery Remaining Percentage: 6 %
Battery Voltage: 2.65 V
Brady Statistic AP VP Percent: 1.1 %
Brady Statistic AP VS Percent: 2.9 %
Brady Statistic AS VP Percent: 39 %
Brady Statistic AS VS Percent: 53 %
Brady Statistic RA Percent Paced: 2.3 %
Brady Statistic RV Percent Paced: 40 %
Date Time Interrogation Session: 20230727040018
HighPow Impedance: 45 Ohm
HighPow Impedance: 46 Ohm
Implantable Lead Implant Date: 20140206
Implantable Lead Implant Date: 20140206
Implantable Lead Location: 753859
Implantable Lead Location: 753860
Implantable Pulse Generator Implant Date: 20170206
Lead Channel Impedance Value: 330 Ohm
Lead Channel Impedance Value: 350 Ohm
Lead Channel Pacing Threshold Amplitude: 1.125 V
Lead Channel Pacing Threshold Amplitude: 3 V
Lead Channel Pacing Threshold Pulse Width: 0.5 ms
Lead Channel Pacing Threshold Pulse Width: 1 ms
Lead Channel Sensing Intrinsic Amplitude: 0.8 mV
Lead Channel Sensing Intrinsic Amplitude: 6.3 mV
Lead Channel Setting Pacing Amplitude: 1.375
Lead Channel Setting Pacing Amplitude: 5 V
Lead Channel Setting Pacing Pulse Width: 0.5 ms
Lead Channel Setting Sensing Sensitivity: 0.5 mV
Pulse Gen Serial Number: 7072725

## 2022-01-17 NOTE — Telephone Encounter (Signed)
Increased ICD battery checks to monthly in Epic/Merlin. Attempted to call patient to notify. No answer, LMTCB.

## 2022-01-21 LAB — CUP PACEART REMOTE DEVICE CHECK
Battery Remaining Longevity: 5 mo
Battery Remaining Percentage: 6 %
Battery Voltage: 2.65 V
Brady Statistic AP VP Percent: 1.1 %
Brady Statistic AP VS Percent: 2.9 %
Brady Statistic AS VP Percent: 39 %
Brady Statistic AS VS Percent: 53 %
Brady Statistic RA Percent Paced: 2.3 %
Brady Statistic RV Percent Paced: 40 %
Date Time Interrogation Session: 20230727040018
HighPow Impedance: 45 Ohm
HighPow Impedance: 46 Ohm
Implantable Lead Implant Date: 20140206
Implantable Lead Implant Date: 20140206
Implantable Lead Location: 753859
Implantable Lead Location: 753860
Implantable Pulse Generator Implant Date: 20170206
Lead Channel Impedance Value: 330 Ohm
Lead Channel Impedance Value: 350 Ohm
Lead Channel Pacing Threshold Amplitude: 1.125 V
Lead Channel Pacing Threshold Amplitude: 3 V
Lead Channel Pacing Threshold Pulse Width: 0.5 ms
Lead Channel Pacing Threshold Pulse Width: 1 ms
Lead Channel Sensing Intrinsic Amplitude: 0.8 mV
Lead Channel Sensing Intrinsic Amplitude: 6.3 mV
Lead Channel Setting Pacing Amplitude: 1.375
Lead Channel Setting Pacing Amplitude: 5 V
Lead Channel Setting Pacing Pulse Width: 0.5 ms
Lead Channel Setting Sensing Sensitivity: 0.5 mV
Pulse Gen Serial Number: 7072725

## 2022-01-22 DIAGNOSIS — I959 Hypotension, unspecified: Secondary | ICD-10-CM | POA: Diagnosis not present

## 2022-01-22 DIAGNOSIS — R627 Adult failure to thrive: Secondary | ICD-10-CM | POA: Diagnosis not present

## 2022-01-22 DIAGNOSIS — R634 Abnormal weight loss: Secondary | ICD-10-CM | POA: Diagnosis not present

## 2022-01-22 DIAGNOSIS — R112 Nausea with vomiting, unspecified: Secondary | ICD-10-CM | POA: Diagnosis not present

## 2022-01-22 DIAGNOSIS — I132 Hypertensive heart and chronic kidney disease with heart failure and with stage 5 chronic kidney disease, or end stage renal disease: Secondary | ICD-10-CM | POA: Diagnosis not present

## 2022-01-22 DIAGNOSIS — Z955 Presence of coronary angioplasty implant and graft: Secondary | ICD-10-CM | POA: Diagnosis not present

## 2022-01-22 DIAGNOSIS — I2510 Atherosclerotic heart disease of native coronary artery without angina pectoris: Secondary | ICD-10-CM | POA: Diagnosis not present

## 2022-01-22 DIAGNOSIS — J449 Chronic obstructive pulmonary disease, unspecified: Secondary | ICD-10-CM | POA: Diagnosis not present

## 2022-01-22 DIAGNOSIS — I5040 Unspecified combined systolic (congestive) and diastolic (congestive) heart failure: Secondary | ICD-10-CM | POA: Diagnosis not present

## 2022-01-22 DIAGNOSIS — K5900 Constipation, unspecified: Secondary | ICD-10-CM | POA: Diagnosis not present

## 2022-01-22 DIAGNOSIS — M109 Gout, unspecified: Secondary | ICD-10-CM | POA: Diagnosis not present

## 2022-01-22 DIAGNOSIS — N185 Chronic kidney disease, stage 5: Secondary | ICD-10-CM | POA: Diagnosis not present

## 2022-01-22 DIAGNOSIS — Z6821 Body mass index (BMI) 21.0-21.9, adult: Secondary | ICD-10-CM | POA: Diagnosis not present

## 2022-01-22 DIAGNOSIS — E785 Hyperlipidemia, unspecified: Secondary | ICD-10-CM | POA: Diagnosis not present

## 2022-01-22 DIAGNOSIS — R251 Tremor, unspecified: Secondary | ICD-10-CM | POA: Diagnosis not present

## 2022-01-22 DIAGNOSIS — I4891 Unspecified atrial fibrillation: Secondary | ICD-10-CM | POA: Diagnosis not present

## 2022-01-22 DIAGNOSIS — I714 Abdominal aortic aneurysm, without rupture, unspecified: Secondary | ICD-10-CM | POA: Diagnosis not present

## 2022-01-22 NOTE — Telephone Encounter (Signed)
I spoke with the patient to let him know that he has 5 months left on his battery. I let him know he is now on monthly battery checks and gave him the next date for his transmission. The patient verbalized understanding and thanked me for the call.   Fyi patient is hard of hearing.

## 2022-01-25 DIAGNOSIS — I4891 Unspecified atrial fibrillation: Secondary | ICD-10-CM | POA: Diagnosis not present

## 2022-01-25 DIAGNOSIS — I5040 Unspecified combined systolic (congestive) and diastolic (congestive) heart failure: Secondary | ICD-10-CM | POA: Diagnosis not present

## 2022-01-25 DIAGNOSIS — I714 Abdominal aortic aneurysm, without rupture, unspecified: Secondary | ICD-10-CM | POA: Diagnosis not present

## 2022-01-25 DIAGNOSIS — J449 Chronic obstructive pulmonary disease, unspecified: Secondary | ICD-10-CM | POA: Diagnosis not present

## 2022-01-25 DIAGNOSIS — N185 Chronic kidney disease, stage 5: Secondary | ICD-10-CM | POA: Diagnosis not present

## 2022-01-25 DIAGNOSIS — I132 Hypertensive heart and chronic kidney disease with heart failure and with stage 5 chronic kidney disease, or end stage renal disease: Secondary | ICD-10-CM | POA: Diagnosis not present

## 2022-01-28 DIAGNOSIS — I5040 Unspecified combined systolic (congestive) and diastolic (congestive) heart failure: Secondary | ICD-10-CM | POA: Diagnosis not present

## 2022-01-28 DIAGNOSIS — N185 Chronic kidney disease, stage 5: Secondary | ICD-10-CM | POA: Diagnosis not present

## 2022-01-28 DIAGNOSIS — J449 Chronic obstructive pulmonary disease, unspecified: Secondary | ICD-10-CM | POA: Diagnosis not present

## 2022-01-28 DIAGNOSIS — I4891 Unspecified atrial fibrillation: Secondary | ICD-10-CM | POA: Diagnosis not present

## 2022-01-28 DIAGNOSIS — I132 Hypertensive heart and chronic kidney disease with heart failure and with stage 5 chronic kidney disease, or end stage renal disease: Secondary | ICD-10-CM | POA: Diagnosis not present

## 2022-01-28 DIAGNOSIS — I714 Abdominal aortic aneurysm, without rupture, unspecified: Secondary | ICD-10-CM | POA: Diagnosis not present

## 2022-01-29 DIAGNOSIS — I714 Abdominal aortic aneurysm, without rupture, unspecified: Secondary | ICD-10-CM | POA: Diagnosis not present

## 2022-01-29 DIAGNOSIS — I5040 Unspecified combined systolic (congestive) and diastolic (congestive) heart failure: Secondary | ICD-10-CM | POA: Diagnosis not present

## 2022-01-29 DIAGNOSIS — N185 Chronic kidney disease, stage 5: Secondary | ICD-10-CM | POA: Diagnosis not present

## 2022-01-29 DIAGNOSIS — I132 Hypertensive heart and chronic kidney disease with heart failure and with stage 5 chronic kidney disease, or end stage renal disease: Secondary | ICD-10-CM | POA: Diagnosis not present

## 2022-01-29 DIAGNOSIS — I4891 Unspecified atrial fibrillation: Secondary | ICD-10-CM | POA: Diagnosis not present

## 2022-01-29 DIAGNOSIS — J449 Chronic obstructive pulmonary disease, unspecified: Secondary | ICD-10-CM | POA: Diagnosis not present

## 2022-02-01 DIAGNOSIS — I5040 Unspecified combined systolic (congestive) and diastolic (congestive) heart failure: Secondary | ICD-10-CM | POA: Diagnosis not present

## 2022-02-01 DIAGNOSIS — I714 Abdominal aortic aneurysm, without rupture, unspecified: Secondary | ICD-10-CM | POA: Diagnosis not present

## 2022-02-01 DIAGNOSIS — J449 Chronic obstructive pulmonary disease, unspecified: Secondary | ICD-10-CM | POA: Diagnosis not present

## 2022-02-01 DIAGNOSIS — I4891 Unspecified atrial fibrillation: Secondary | ICD-10-CM | POA: Diagnosis not present

## 2022-02-01 DIAGNOSIS — I132 Hypertensive heart and chronic kidney disease with heart failure and with stage 5 chronic kidney disease, or end stage renal disease: Secondary | ICD-10-CM | POA: Diagnosis not present

## 2022-02-01 DIAGNOSIS — N185 Chronic kidney disease, stage 5: Secondary | ICD-10-CM | POA: Diagnosis not present

## 2022-02-04 DIAGNOSIS — I132 Hypertensive heart and chronic kidney disease with heart failure and with stage 5 chronic kidney disease, or end stage renal disease: Secondary | ICD-10-CM | POA: Diagnosis not present

## 2022-02-04 DIAGNOSIS — N185 Chronic kidney disease, stage 5: Secondary | ICD-10-CM | POA: Diagnosis not present

## 2022-02-04 DIAGNOSIS — J449 Chronic obstructive pulmonary disease, unspecified: Secondary | ICD-10-CM | POA: Diagnosis not present

## 2022-02-04 DIAGNOSIS — I5040 Unspecified combined systolic (congestive) and diastolic (congestive) heart failure: Secondary | ICD-10-CM | POA: Diagnosis not present

## 2022-02-04 DIAGNOSIS — I4891 Unspecified atrial fibrillation: Secondary | ICD-10-CM | POA: Diagnosis not present

## 2022-02-04 DIAGNOSIS — I714 Abdominal aortic aneurysm, without rupture, unspecified: Secondary | ICD-10-CM | POA: Diagnosis not present

## 2022-02-05 DIAGNOSIS — N185 Chronic kidney disease, stage 5: Secondary | ICD-10-CM | POA: Diagnosis not present

## 2022-02-05 DIAGNOSIS — I132 Hypertensive heart and chronic kidney disease with heart failure and with stage 5 chronic kidney disease, or end stage renal disease: Secondary | ICD-10-CM | POA: Diagnosis not present

## 2022-02-05 DIAGNOSIS — I714 Abdominal aortic aneurysm, without rupture, unspecified: Secondary | ICD-10-CM | POA: Diagnosis not present

## 2022-02-05 DIAGNOSIS — I5040 Unspecified combined systolic (congestive) and diastolic (congestive) heart failure: Secondary | ICD-10-CM | POA: Diagnosis not present

## 2022-02-05 DIAGNOSIS — I4891 Unspecified atrial fibrillation: Secondary | ICD-10-CM | POA: Diagnosis not present

## 2022-02-05 DIAGNOSIS — J449 Chronic obstructive pulmonary disease, unspecified: Secondary | ICD-10-CM | POA: Diagnosis not present

## 2022-02-07 NOTE — Progress Notes (Signed)
Remote ICD transmission.   

## 2022-02-08 DIAGNOSIS — I714 Abdominal aortic aneurysm, without rupture, unspecified: Secondary | ICD-10-CM | POA: Diagnosis not present

## 2022-02-08 DIAGNOSIS — J449 Chronic obstructive pulmonary disease, unspecified: Secondary | ICD-10-CM | POA: Diagnosis not present

## 2022-02-08 DIAGNOSIS — I4891 Unspecified atrial fibrillation: Secondary | ICD-10-CM | POA: Diagnosis not present

## 2022-02-08 DIAGNOSIS — N185 Chronic kidney disease, stage 5: Secondary | ICD-10-CM | POA: Diagnosis not present

## 2022-02-08 DIAGNOSIS — I5040 Unspecified combined systolic (congestive) and diastolic (congestive) heart failure: Secondary | ICD-10-CM | POA: Diagnosis not present

## 2022-02-08 DIAGNOSIS — I132 Hypertensive heart and chronic kidney disease with heart failure and with stage 5 chronic kidney disease, or end stage renal disease: Secondary | ICD-10-CM | POA: Diagnosis not present

## 2022-02-11 DIAGNOSIS — J449 Chronic obstructive pulmonary disease, unspecified: Secondary | ICD-10-CM | POA: Diagnosis not present

## 2022-02-11 DIAGNOSIS — I4891 Unspecified atrial fibrillation: Secondary | ICD-10-CM | POA: Diagnosis not present

## 2022-02-11 DIAGNOSIS — I714 Abdominal aortic aneurysm, without rupture, unspecified: Secondary | ICD-10-CM | POA: Diagnosis not present

## 2022-02-11 DIAGNOSIS — I5040 Unspecified combined systolic (congestive) and diastolic (congestive) heart failure: Secondary | ICD-10-CM | POA: Diagnosis not present

## 2022-02-11 DIAGNOSIS — I132 Hypertensive heart and chronic kidney disease with heart failure and with stage 5 chronic kidney disease, or end stage renal disease: Secondary | ICD-10-CM | POA: Diagnosis not present

## 2022-02-11 DIAGNOSIS — N185 Chronic kidney disease, stage 5: Secondary | ICD-10-CM | POA: Diagnosis not present

## 2022-02-12 DIAGNOSIS — I132 Hypertensive heart and chronic kidney disease with heart failure and with stage 5 chronic kidney disease, or end stage renal disease: Secondary | ICD-10-CM | POA: Diagnosis not present

## 2022-02-12 DIAGNOSIS — I5040 Unspecified combined systolic (congestive) and diastolic (congestive) heart failure: Secondary | ICD-10-CM | POA: Diagnosis not present

## 2022-02-12 DIAGNOSIS — I4891 Unspecified atrial fibrillation: Secondary | ICD-10-CM | POA: Diagnosis not present

## 2022-02-12 DIAGNOSIS — J449 Chronic obstructive pulmonary disease, unspecified: Secondary | ICD-10-CM | POA: Diagnosis not present

## 2022-02-12 DIAGNOSIS — N185 Chronic kidney disease, stage 5: Secondary | ICD-10-CM | POA: Diagnosis not present

## 2022-02-12 DIAGNOSIS — I714 Abdominal aortic aneurysm, without rupture, unspecified: Secondary | ICD-10-CM | POA: Diagnosis not present

## 2022-02-15 DIAGNOSIS — I5040 Unspecified combined systolic (congestive) and diastolic (congestive) heart failure: Secondary | ICD-10-CM | POA: Diagnosis not present

## 2022-02-15 DIAGNOSIS — I4891 Unspecified atrial fibrillation: Secondary | ICD-10-CM | POA: Diagnosis not present

## 2022-02-15 DIAGNOSIS — I714 Abdominal aortic aneurysm, without rupture, unspecified: Secondary | ICD-10-CM | POA: Diagnosis not present

## 2022-02-15 DIAGNOSIS — I132 Hypertensive heart and chronic kidney disease with heart failure and with stage 5 chronic kidney disease, or end stage renal disease: Secondary | ICD-10-CM | POA: Diagnosis not present

## 2022-02-15 DIAGNOSIS — N185 Chronic kidney disease, stage 5: Secondary | ICD-10-CM | POA: Diagnosis not present

## 2022-02-15 DIAGNOSIS — J449 Chronic obstructive pulmonary disease, unspecified: Secondary | ICD-10-CM | POA: Diagnosis not present

## 2022-02-18 ENCOUNTER — Ambulatory Visit (INDEPENDENT_AMBULATORY_CARE_PROVIDER_SITE_OTHER): Payer: Medicare Other

## 2022-02-18 DIAGNOSIS — I255 Ischemic cardiomyopathy: Secondary | ICD-10-CM

## 2022-02-18 DIAGNOSIS — I5040 Unspecified combined systolic (congestive) and diastolic (congestive) heart failure: Secondary | ICD-10-CM | POA: Diagnosis not present

## 2022-02-18 DIAGNOSIS — I132 Hypertensive heart and chronic kidney disease with heart failure and with stage 5 chronic kidney disease, or end stage renal disease: Secondary | ICD-10-CM | POA: Diagnosis not present

## 2022-02-18 DIAGNOSIS — I714 Abdominal aortic aneurysm, without rupture, unspecified: Secondary | ICD-10-CM | POA: Diagnosis not present

## 2022-02-18 DIAGNOSIS — N185 Chronic kidney disease, stage 5: Secondary | ICD-10-CM | POA: Diagnosis not present

## 2022-02-18 DIAGNOSIS — I4891 Unspecified atrial fibrillation: Secondary | ICD-10-CM | POA: Diagnosis not present

## 2022-02-18 DIAGNOSIS — I5022 Chronic systolic (congestive) heart failure: Secondary | ICD-10-CM

## 2022-02-18 DIAGNOSIS — J449 Chronic obstructive pulmonary disease, unspecified: Secondary | ICD-10-CM | POA: Diagnosis not present

## 2022-02-19 LAB — CUP PACEART REMOTE DEVICE CHECK
Battery Remaining Longevity: 3 mo
Battery Remaining Percentage: 4 %
Battery Voltage: 2.62 V
Brady Statistic AP VP Percent: 1.1 %
Brady Statistic AP VS Percent: 2.7 %
Brady Statistic AS VP Percent: 38 %
Brady Statistic AS VS Percent: 55 %
Brady Statistic RA Percent Paced: 2.2 %
Brady Statistic RV Percent Paced: 39 %
Date Time Interrogation Session: 20230828020019
HighPow Impedance: 53 Ohm
HighPow Impedance: 54 Ohm
Implantable Lead Implant Date: 20140206
Implantable Lead Implant Date: 20140206
Implantable Lead Location: 753859
Implantable Lead Location: 753860
Implantable Pulse Generator Implant Date: 20170206
Lead Channel Impedance Value: 350 Ohm
Lead Channel Impedance Value: 380 Ohm
Lead Channel Pacing Threshold Amplitude: 1 V
Lead Channel Pacing Threshold Amplitude: 3 V
Lead Channel Pacing Threshold Pulse Width: 0.5 ms
Lead Channel Pacing Threshold Pulse Width: 1 ms
Lead Channel Sensing Intrinsic Amplitude: 1.5 mV
Lead Channel Sensing Intrinsic Amplitude: 6.8 mV
Lead Channel Setting Pacing Amplitude: 1.25 V
Lead Channel Setting Pacing Amplitude: 5 V
Lead Channel Setting Pacing Pulse Width: 0.5 ms
Lead Channel Setting Sensing Sensitivity: 0.5 mV
Pulse Gen Serial Number: 7072725

## 2022-02-22 DIAGNOSIS — K5900 Constipation, unspecified: Secondary | ICD-10-CM | POA: Diagnosis not present

## 2022-02-22 DIAGNOSIS — R634 Abnormal weight loss: Secondary | ICD-10-CM | POA: Diagnosis not present

## 2022-02-22 DIAGNOSIS — J449 Chronic obstructive pulmonary disease, unspecified: Secondary | ICD-10-CM | POA: Diagnosis not present

## 2022-02-22 DIAGNOSIS — R251 Tremor, unspecified: Secondary | ICD-10-CM | POA: Diagnosis not present

## 2022-02-22 DIAGNOSIS — R112 Nausea with vomiting, unspecified: Secondary | ICD-10-CM | POA: Diagnosis not present

## 2022-02-22 DIAGNOSIS — Z955 Presence of coronary angioplasty implant and graft: Secondary | ICD-10-CM | POA: Diagnosis not present

## 2022-02-22 DIAGNOSIS — I5040 Unspecified combined systolic (congestive) and diastolic (congestive) heart failure: Secondary | ICD-10-CM | POA: Diagnosis not present

## 2022-02-22 DIAGNOSIS — N185 Chronic kidney disease, stage 5: Secondary | ICD-10-CM | POA: Diagnosis not present

## 2022-02-22 DIAGNOSIS — M109 Gout, unspecified: Secondary | ICD-10-CM | POA: Diagnosis not present

## 2022-02-22 DIAGNOSIS — Z6821 Body mass index (BMI) 21.0-21.9, adult: Secondary | ICD-10-CM | POA: Diagnosis not present

## 2022-02-22 DIAGNOSIS — R627 Adult failure to thrive: Secondary | ICD-10-CM | POA: Diagnosis not present

## 2022-02-22 DIAGNOSIS — I4891 Unspecified atrial fibrillation: Secondary | ICD-10-CM | POA: Diagnosis not present

## 2022-02-22 DIAGNOSIS — I132 Hypertensive heart and chronic kidney disease with heart failure and with stage 5 chronic kidney disease, or end stage renal disease: Secondary | ICD-10-CM | POA: Diagnosis not present

## 2022-02-22 DIAGNOSIS — I959 Hypotension, unspecified: Secondary | ICD-10-CM | POA: Diagnosis not present

## 2022-02-22 DIAGNOSIS — E785 Hyperlipidemia, unspecified: Secondary | ICD-10-CM | POA: Diagnosis not present

## 2022-02-22 DIAGNOSIS — I714 Abdominal aortic aneurysm, without rupture, unspecified: Secondary | ICD-10-CM | POA: Diagnosis not present

## 2022-02-22 DIAGNOSIS — I2510 Atherosclerotic heart disease of native coronary artery without angina pectoris: Secondary | ICD-10-CM | POA: Diagnosis not present

## 2022-02-26 DIAGNOSIS — I132 Hypertensive heart and chronic kidney disease with heart failure and with stage 5 chronic kidney disease, or end stage renal disease: Secondary | ICD-10-CM | POA: Diagnosis not present

## 2022-02-26 DIAGNOSIS — I5040 Unspecified combined systolic (congestive) and diastolic (congestive) heart failure: Secondary | ICD-10-CM | POA: Diagnosis not present

## 2022-02-26 DIAGNOSIS — I4891 Unspecified atrial fibrillation: Secondary | ICD-10-CM | POA: Diagnosis not present

## 2022-02-26 DIAGNOSIS — J449 Chronic obstructive pulmonary disease, unspecified: Secondary | ICD-10-CM | POA: Diagnosis not present

## 2022-02-26 DIAGNOSIS — N185 Chronic kidney disease, stage 5: Secondary | ICD-10-CM | POA: Diagnosis not present

## 2022-02-26 DIAGNOSIS — I714 Abdominal aortic aneurysm, without rupture, unspecified: Secondary | ICD-10-CM | POA: Diagnosis not present

## 2022-02-28 DIAGNOSIS — I714 Abdominal aortic aneurysm, without rupture, unspecified: Secondary | ICD-10-CM | POA: Diagnosis not present

## 2022-02-28 DIAGNOSIS — I4891 Unspecified atrial fibrillation: Secondary | ICD-10-CM | POA: Diagnosis not present

## 2022-02-28 DIAGNOSIS — I132 Hypertensive heart and chronic kidney disease with heart failure and with stage 5 chronic kidney disease, or end stage renal disease: Secondary | ICD-10-CM | POA: Diagnosis not present

## 2022-02-28 DIAGNOSIS — N185 Chronic kidney disease, stage 5: Secondary | ICD-10-CM | POA: Diagnosis not present

## 2022-02-28 DIAGNOSIS — I5040 Unspecified combined systolic (congestive) and diastolic (congestive) heart failure: Secondary | ICD-10-CM | POA: Diagnosis not present

## 2022-02-28 DIAGNOSIS — J449 Chronic obstructive pulmonary disease, unspecified: Secondary | ICD-10-CM | POA: Diagnosis not present

## 2022-03-01 DIAGNOSIS — I4891 Unspecified atrial fibrillation: Secondary | ICD-10-CM | POA: Diagnosis not present

## 2022-03-01 DIAGNOSIS — I714 Abdominal aortic aneurysm, without rupture, unspecified: Secondary | ICD-10-CM | POA: Diagnosis not present

## 2022-03-01 DIAGNOSIS — I5040 Unspecified combined systolic (congestive) and diastolic (congestive) heart failure: Secondary | ICD-10-CM | POA: Diagnosis not present

## 2022-03-01 DIAGNOSIS — J449 Chronic obstructive pulmonary disease, unspecified: Secondary | ICD-10-CM | POA: Diagnosis not present

## 2022-03-01 DIAGNOSIS — I132 Hypertensive heart and chronic kidney disease with heart failure and with stage 5 chronic kidney disease, or end stage renal disease: Secondary | ICD-10-CM | POA: Diagnosis not present

## 2022-03-01 DIAGNOSIS — N185 Chronic kidney disease, stage 5: Secondary | ICD-10-CM | POA: Diagnosis not present

## 2022-03-05 DIAGNOSIS — I714 Abdominal aortic aneurysm, without rupture, unspecified: Secondary | ICD-10-CM | POA: Diagnosis not present

## 2022-03-05 DIAGNOSIS — J449 Chronic obstructive pulmonary disease, unspecified: Secondary | ICD-10-CM | POA: Diagnosis not present

## 2022-03-05 DIAGNOSIS — I5040 Unspecified combined systolic (congestive) and diastolic (congestive) heart failure: Secondary | ICD-10-CM | POA: Diagnosis not present

## 2022-03-05 DIAGNOSIS — N185 Chronic kidney disease, stage 5: Secondary | ICD-10-CM | POA: Diagnosis not present

## 2022-03-05 DIAGNOSIS — I4891 Unspecified atrial fibrillation: Secondary | ICD-10-CM | POA: Diagnosis not present

## 2022-03-05 DIAGNOSIS — I132 Hypertensive heart and chronic kidney disease with heart failure and with stage 5 chronic kidney disease, or end stage renal disease: Secondary | ICD-10-CM | POA: Diagnosis not present

## 2022-03-12 DIAGNOSIS — I5040 Unspecified combined systolic (congestive) and diastolic (congestive) heart failure: Secondary | ICD-10-CM | POA: Diagnosis not present

## 2022-03-12 DIAGNOSIS — J449 Chronic obstructive pulmonary disease, unspecified: Secondary | ICD-10-CM | POA: Diagnosis not present

## 2022-03-12 DIAGNOSIS — I4891 Unspecified atrial fibrillation: Secondary | ICD-10-CM | POA: Diagnosis not present

## 2022-03-12 DIAGNOSIS — I132 Hypertensive heart and chronic kidney disease with heart failure and with stage 5 chronic kidney disease, or end stage renal disease: Secondary | ICD-10-CM | POA: Diagnosis not present

## 2022-03-12 DIAGNOSIS — I714 Abdominal aortic aneurysm, without rupture, unspecified: Secondary | ICD-10-CM | POA: Diagnosis not present

## 2022-03-12 DIAGNOSIS — N185 Chronic kidney disease, stage 5: Secondary | ICD-10-CM | POA: Diagnosis not present

## 2022-03-15 DIAGNOSIS — J449 Chronic obstructive pulmonary disease, unspecified: Secondary | ICD-10-CM | POA: Diagnosis not present

## 2022-03-15 DIAGNOSIS — I714 Abdominal aortic aneurysm, without rupture, unspecified: Secondary | ICD-10-CM | POA: Diagnosis not present

## 2022-03-15 DIAGNOSIS — I132 Hypertensive heart and chronic kidney disease with heart failure and with stage 5 chronic kidney disease, or end stage renal disease: Secondary | ICD-10-CM | POA: Diagnosis not present

## 2022-03-15 DIAGNOSIS — I5040 Unspecified combined systolic (congestive) and diastolic (congestive) heart failure: Secondary | ICD-10-CM | POA: Diagnosis not present

## 2022-03-15 DIAGNOSIS — I4891 Unspecified atrial fibrillation: Secondary | ICD-10-CM | POA: Diagnosis not present

## 2022-03-15 DIAGNOSIS — N185 Chronic kidney disease, stage 5: Secondary | ICD-10-CM | POA: Diagnosis not present

## 2022-03-15 NOTE — Addendum Note (Signed)
Addended by: Douglass Rivers D on: 03/15/2022 10:24 AM   Modules accepted: Level of Service

## 2022-03-15 NOTE — Progress Notes (Signed)
Remote ICD transmission.   

## 2022-03-18 DIAGNOSIS — I5040 Unspecified combined systolic (congestive) and diastolic (congestive) heart failure: Secondary | ICD-10-CM | POA: Diagnosis not present

## 2022-03-18 DIAGNOSIS — I714 Abdominal aortic aneurysm, without rupture, unspecified: Secondary | ICD-10-CM | POA: Diagnosis not present

## 2022-03-18 DIAGNOSIS — I132 Hypertensive heart and chronic kidney disease with heart failure and with stage 5 chronic kidney disease, or end stage renal disease: Secondary | ICD-10-CM | POA: Diagnosis not present

## 2022-03-18 DIAGNOSIS — I4891 Unspecified atrial fibrillation: Secondary | ICD-10-CM | POA: Diagnosis not present

## 2022-03-18 DIAGNOSIS — J449 Chronic obstructive pulmonary disease, unspecified: Secondary | ICD-10-CM | POA: Diagnosis not present

## 2022-03-18 DIAGNOSIS — N185 Chronic kidney disease, stage 5: Secondary | ICD-10-CM | POA: Diagnosis not present

## 2022-03-19 DIAGNOSIS — I132 Hypertensive heart and chronic kidney disease with heart failure and with stage 5 chronic kidney disease, or end stage renal disease: Secondary | ICD-10-CM | POA: Diagnosis not present

## 2022-03-19 DIAGNOSIS — N185 Chronic kidney disease, stage 5: Secondary | ICD-10-CM | POA: Diagnosis not present

## 2022-03-19 DIAGNOSIS — I4891 Unspecified atrial fibrillation: Secondary | ICD-10-CM | POA: Diagnosis not present

## 2022-03-19 DIAGNOSIS — J449 Chronic obstructive pulmonary disease, unspecified: Secondary | ICD-10-CM | POA: Diagnosis not present

## 2022-03-19 DIAGNOSIS — I5040 Unspecified combined systolic (congestive) and diastolic (congestive) heart failure: Secondary | ICD-10-CM | POA: Diagnosis not present

## 2022-03-19 DIAGNOSIS — I714 Abdominal aortic aneurysm, without rupture, unspecified: Secondary | ICD-10-CM | POA: Diagnosis not present

## 2022-03-21 ENCOUNTER — Ambulatory Visit (INDEPENDENT_AMBULATORY_CARE_PROVIDER_SITE_OTHER): Payer: Medicare Other

## 2022-03-21 DIAGNOSIS — I255 Ischemic cardiomyopathy: Secondary | ICD-10-CM

## 2022-03-22 DIAGNOSIS — I4891 Unspecified atrial fibrillation: Secondary | ICD-10-CM | POA: Diagnosis not present

## 2022-03-22 DIAGNOSIS — I132 Hypertensive heart and chronic kidney disease with heart failure and with stage 5 chronic kidney disease, or end stage renal disease: Secondary | ICD-10-CM | POA: Diagnosis not present

## 2022-03-22 DIAGNOSIS — N185 Chronic kidney disease, stage 5: Secondary | ICD-10-CM | POA: Diagnosis not present

## 2022-03-22 DIAGNOSIS — J449 Chronic obstructive pulmonary disease, unspecified: Secondary | ICD-10-CM | POA: Diagnosis not present

## 2022-03-22 DIAGNOSIS — I714 Abdominal aortic aneurysm, without rupture, unspecified: Secondary | ICD-10-CM | POA: Diagnosis not present

## 2022-03-22 DIAGNOSIS — I5040 Unspecified combined systolic (congestive) and diastolic (congestive) heart failure: Secondary | ICD-10-CM | POA: Diagnosis not present

## 2022-03-22 LAB — CUP PACEART REMOTE DEVICE CHECK
Battery Remaining Longevity: 3 mo
Battery Remaining Percentage: 4 %
Battery Voltage: 2.62 V
Brady Statistic AP VP Percent: 1 %
Brady Statistic AP VS Percent: 2.6 %
Brady Statistic AS VP Percent: 36 %
Brady Statistic AS VS Percent: 57 %
Brady Statistic RA Percent Paced: 2 %
Brady Statistic RV Percent Paced: 37 %
Date Time Interrogation Session: 20230928040017
HighPow Impedance: 51 Ohm
HighPow Impedance: 51 Ohm
Implantable Lead Implant Date: 20140206
Implantable Lead Implant Date: 20140206
Implantable Lead Location: 753859
Implantable Lead Location: 753860
Implantable Pulse Generator Implant Date: 20170206
Lead Channel Impedance Value: 330 Ohm
Lead Channel Impedance Value: 380 Ohm
Lead Channel Pacing Threshold Amplitude: 1 V
Lead Channel Pacing Threshold Amplitude: 3 V
Lead Channel Pacing Threshold Pulse Width: 0.5 ms
Lead Channel Pacing Threshold Pulse Width: 1 ms
Lead Channel Sensing Intrinsic Amplitude: 0.8 mV
Lead Channel Sensing Intrinsic Amplitude: 4.5 mV
Lead Channel Setting Pacing Amplitude: 1.25 V
Lead Channel Setting Pacing Amplitude: 5 V
Lead Channel Setting Pacing Pulse Width: 0.5 ms
Lead Channel Setting Sensing Sensitivity: 0.5 mV
Pulse Gen Serial Number: 7072725

## 2022-03-24 DIAGNOSIS — I714 Abdominal aortic aneurysm, without rupture, unspecified: Secondary | ICD-10-CM | POA: Diagnosis not present

## 2022-03-24 DIAGNOSIS — L89302 Pressure ulcer of unspecified buttock, stage 2: Secondary | ICD-10-CM | POA: Diagnosis not present

## 2022-03-24 DIAGNOSIS — E43 Unspecified severe protein-calorie malnutrition: Secondary | ICD-10-CM | POA: Diagnosis not present

## 2022-03-24 DIAGNOSIS — K5900 Constipation, unspecified: Secondary | ICD-10-CM | POA: Diagnosis not present

## 2022-03-24 DIAGNOSIS — N185 Chronic kidney disease, stage 5: Secondary | ICD-10-CM | POA: Diagnosis not present

## 2022-03-24 DIAGNOSIS — M109 Gout, unspecified: Secondary | ICD-10-CM | POA: Diagnosis not present

## 2022-03-24 DIAGNOSIS — R112 Nausea with vomiting, unspecified: Secondary | ICD-10-CM | POA: Diagnosis not present

## 2022-03-24 DIAGNOSIS — I959 Hypotension, unspecified: Secondary | ICD-10-CM | POA: Diagnosis not present

## 2022-03-24 DIAGNOSIS — I2510 Atherosclerotic heart disease of native coronary artery without angina pectoris: Secondary | ICD-10-CM | POA: Diagnosis not present

## 2022-03-24 DIAGNOSIS — I4891 Unspecified atrial fibrillation: Secondary | ICD-10-CM | POA: Diagnosis not present

## 2022-03-24 DIAGNOSIS — L89890 Pressure ulcer of other site, unstageable: Secondary | ICD-10-CM | POA: Diagnosis not present

## 2022-03-24 DIAGNOSIS — Z955 Presence of coronary angioplasty implant and graft: Secondary | ICD-10-CM | POA: Diagnosis not present

## 2022-03-24 DIAGNOSIS — R627 Adult failure to thrive: Secondary | ICD-10-CM | POA: Diagnosis not present

## 2022-03-24 DIAGNOSIS — J449 Chronic obstructive pulmonary disease, unspecified: Secondary | ICD-10-CM | POA: Diagnosis not present

## 2022-03-24 DIAGNOSIS — E785 Hyperlipidemia, unspecified: Secondary | ICD-10-CM | POA: Diagnosis not present

## 2022-03-24 DIAGNOSIS — Z6821 Body mass index (BMI) 21.0-21.9, adult: Secondary | ICD-10-CM | POA: Diagnosis not present

## 2022-03-24 DIAGNOSIS — R251 Tremor, unspecified: Secondary | ICD-10-CM | POA: Diagnosis not present

## 2022-03-24 DIAGNOSIS — R634 Abnormal weight loss: Secondary | ICD-10-CM | POA: Diagnosis not present

## 2022-03-24 DIAGNOSIS — I5040 Unspecified combined systolic (congestive) and diastolic (congestive) heart failure: Secondary | ICD-10-CM | POA: Diagnosis not present

## 2022-03-24 DIAGNOSIS — I132 Hypertensive heart and chronic kidney disease with heart failure and with stage 5 chronic kidney disease, or end stage renal disease: Secondary | ICD-10-CM | POA: Diagnosis not present

## 2022-03-25 DIAGNOSIS — I4891 Unspecified atrial fibrillation: Secondary | ICD-10-CM | POA: Diagnosis not present

## 2022-03-25 DIAGNOSIS — J449 Chronic obstructive pulmonary disease, unspecified: Secondary | ICD-10-CM | POA: Diagnosis not present

## 2022-03-25 DIAGNOSIS — I132 Hypertensive heart and chronic kidney disease with heart failure and with stage 5 chronic kidney disease, or end stage renal disease: Secondary | ICD-10-CM | POA: Diagnosis not present

## 2022-03-25 DIAGNOSIS — N185 Chronic kidney disease, stage 5: Secondary | ICD-10-CM | POA: Diagnosis not present

## 2022-03-25 DIAGNOSIS — I714 Abdominal aortic aneurysm, without rupture, unspecified: Secondary | ICD-10-CM | POA: Diagnosis not present

## 2022-03-25 DIAGNOSIS — I5040 Unspecified combined systolic (congestive) and diastolic (congestive) heart failure: Secondary | ICD-10-CM | POA: Diagnosis not present

## 2022-03-26 DIAGNOSIS — I4891 Unspecified atrial fibrillation: Secondary | ICD-10-CM | POA: Diagnosis not present

## 2022-03-26 DIAGNOSIS — N185 Chronic kidney disease, stage 5: Secondary | ICD-10-CM | POA: Diagnosis not present

## 2022-03-26 DIAGNOSIS — I714 Abdominal aortic aneurysm, without rupture, unspecified: Secondary | ICD-10-CM | POA: Diagnosis not present

## 2022-03-26 DIAGNOSIS — J449 Chronic obstructive pulmonary disease, unspecified: Secondary | ICD-10-CM | POA: Diagnosis not present

## 2022-03-26 DIAGNOSIS — I5040 Unspecified combined systolic (congestive) and diastolic (congestive) heart failure: Secondary | ICD-10-CM | POA: Diagnosis not present

## 2022-03-26 DIAGNOSIS — I132 Hypertensive heart and chronic kidney disease with heart failure and with stage 5 chronic kidney disease, or end stage renal disease: Secondary | ICD-10-CM | POA: Diagnosis not present

## 2022-03-27 NOTE — Addendum Note (Signed)
Addended by: Cheri Kearns A on: 03/27/2022 03:00 PM   Modules accepted: Level of Service

## 2022-03-27 NOTE — Progress Notes (Signed)
Remote ICD transmission.   

## 2022-03-29 DIAGNOSIS — I132 Hypertensive heart and chronic kidney disease with heart failure and with stage 5 chronic kidney disease, or end stage renal disease: Secondary | ICD-10-CM | POA: Diagnosis not present

## 2022-03-29 DIAGNOSIS — I4891 Unspecified atrial fibrillation: Secondary | ICD-10-CM | POA: Diagnosis not present

## 2022-03-29 DIAGNOSIS — I5040 Unspecified combined systolic (congestive) and diastolic (congestive) heart failure: Secondary | ICD-10-CM | POA: Diagnosis not present

## 2022-03-29 DIAGNOSIS — I714 Abdominal aortic aneurysm, without rupture, unspecified: Secondary | ICD-10-CM | POA: Diagnosis not present

## 2022-03-29 DIAGNOSIS — N185 Chronic kidney disease, stage 5: Secondary | ICD-10-CM | POA: Diagnosis not present

## 2022-03-29 DIAGNOSIS — J449 Chronic obstructive pulmonary disease, unspecified: Secondary | ICD-10-CM | POA: Diagnosis not present

## 2022-04-02 DIAGNOSIS — I4891 Unspecified atrial fibrillation: Secondary | ICD-10-CM | POA: Diagnosis not present

## 2022-04-02 DIAGNOSIS — I5040 Unspecified combined systolic (congestive) and diastolic (congestive) heart failure: Secondary | ICD-10-CM | POA: Diagnosis not present

## 2022-04-02 DIAGNOSIS — I714 Abdominal aortic aneurysm, without rupture, unspecified: Secondary | ICD-10-CM | POA: Diagnosis not present

## 2022-04-02 DIAGNOSIS — J449 Chronic obstructive pulmonary disease, unspecified: Secondary | ICD-10-CM | POA: Diagnosis not present

## 2022-04-02 DIAGNOSIS — I132 Hypertensive heart and chronic kidney disease with heart failure and with stage 5 chronic kidney disease, or end stage renal disease: Secondary | ICD-10-CM | POA: Diagnosis not present

## 2022-04-02 DIAGNOSIS — N185 Chronic kidney disease, stage 5: Secondary | ICD-10-CM | POA: Diagnosis not present

## 2022-04-04 DIAGNOSIS — I4891 Unspecified atrial fibrillation: Secondary | ICD-10-CM | POA: Diagnosis not present

## 2022-04-04 DIAGNOSIS — I714 Abdominal aortic aneurysm, without rupture, unspecified: Secondary | ICD-10-CM | POA: Diagnosis not present

## 2022-04-04 DIAGNOSIS — I5040 Unspecified combined systolic (congestive) and diastolic (congestive) heart failure: Secondary | ICD-10-CM | POA: Diagnosis not present

## 2022-04-04 DIAGNOSIS — I132 Hypertensive heart and chronic kidney disease with heart failure and with stage 5 chronic kidney disease, or end stage renal disease: Secondary | ICD-10-CM | POA: Diagnosis not present

## 2022-04-04 DIAGNOSIS — J449 Chronic obstructive pulmonary disease, unspecified: Secondary | ICD-10-CM | POA: Diagnosis not present

## 2022-04-04 DIAGNOSIS — N185 Chronic kidney disease, stage 5: Secondary | ICD-10-CM | POA: Diagnosis not present

## 2022-04-05 ENCOUNTER — Encounter (HOSPITAL_COMMUNITY): Payer: Self-pay

## 2022-04-05 ENCOUNTER — Emergency Department (HOSPITAL_COMMUNITY)

## 2022-04-05 ENCOUNTER — Emergency Department (HOSPITAL_COMMUNITY)
Admission: EM | Admit: 2022-04-05 | Discharge: 2022-04-05 | Disposition: A | Discharge status: Home / Self Care | Attending: Emergency Medicine | Admitting: Emergency Medicine

## 2022-04-05 ENCOUNTER — Other Ambulatory Visit: Payer: Self-pay

## 2022-04-05 DIAGNOSIS — J449 Chronic obstructive pulmonary disease, unspecified: Secondary | ICD-10-CM | POA: Diagnosis not present

## 2022-04-05 DIAGNOSIS — N185 Chronic kidney disease, stage 5: Secondary | ICD-10-CM | POA: Diagnosis not present

## 2022-04-05 DIAGNOSIS — I132 Hypertensive heart and chronic kidney disease with heart failure and with stage 5 chronic kidney disease, or end stage renal disease: Secondary | ICD-10-CM | POA: Diagnosis not present

## 2022-04-05 DIAGNOSIS — Z7901 Long term (current) use of anticoagulants: Secondary | ICD-10-CM | POA: Diagnosis not present

## 2022-04-05 DIAGNOSIS — R569 Unspecified convulsions: Secondary | ICD-10-CM | POA: Diagnosis not present

## 2022-04-05 DIAGNOSIS — J811 Chronic pulmonary edema: Secondary | ICD-10-CM | POA: Diagnosis not present

## 2022-04-05 DIAGNOSIS — R202 Paresthesia of skin: Secondary | ICD-10-CM | POA: Diagnosis not present

## 2022-04-05 DIAGNOSIS — G9349 Other encephalopathy: Secondary | ICD-10-CM | POA: Diagnosis not present

## 2022-04-05 DIAGNOSIS — I5040 Unspecified combined systolic (congestive) and diastolic (congestive) heart failure: Secondary | ICD-10-CM | POA: Diagnosis not present

## 2022-04-05 DIAGNOSIS — G9340 Encephalopathy, unspecified: Secondary | ICD-10-CM | POA: Diagnosis not present

## 2022-04-05 DIAGNOSIS — I714 Abdominal aortic aneurysm, without rupture, unspecified: Secondary | ICD-10-CM | POA: Diagnosis not present

## 2022-04-05 DIAGNOSIS — I4891 Unspecified atrial fibrillation: Secondary | ICD-10-CM | POA: Diagnosis not present

## 2022-04-05 DIAGNOSIS — Z7401 Bed confinement status: Secondary | ICD-10-CM | POA: Diagnosis not present

## 2022-04-05 DIAGNOSIS — I959 Hypotension, unspecified: Secondary | ICD-10-CM | POA: Diagnosis not present

## 2022-04-05 DIAGNOSIS — I10 Essential (primary) hypertension: Secondary | ICD-10-CM | POA: Diagnosis not present

## 2022-04-05 LAB — CBC WITH DIFFERENTIAL/PLATELET
Abs Immature Granulocytes: 0.05 10*3/uL (ref 0.00–0.07)
Basophils Absolute: 0 10*3/uL (ref 0.0–0.1)
Basophils Relative: 0 %
Eosinophils Absolute: 0 10*3/uL (ref 0.0–0.5)
Eosinophils Relative: 0 %
HCT: 27.3 % — ABNORMAL LOW (ref 39.0–52.0)
Hemoglobin: 10 g/dL — ABNORMAL LOW (ref 13.0–17.0)
Immature Granulocytes: 1 %
Lymphocytes Relative: 10 %
Lymphs Abs: 0.7 10*3/uL (ref 0.7–4.0)
MCH: 29.9 pg (ref 26.0–34.0)
MCHC: 36.6 g/dL — ABNORMAL HIGH (ref 30.0–36.0)
MCV: 81.5 fL (ref 80.0–100.0)
Monocytes Absolute: 0.7 10*3/uL (ref 0.1–1.0)
Monocytes Relative: 9 %
Neutro Abs: 6.1 10*3/uL (ref 1.7–7.7)
Neutrophils Relative %: 80 %
Platelets: 138 10*3/uL — ABNORMAL LOW (ref 150–400)
RBC: 3.35 MIL/uL — ABNORMAL LOW (ref 4.22–5.81)
RDW: 14.8 % (ref 11.5–15.5)
WBC: 7.7 10*3/uL (ref 4.0–10.5)
nRBC: 0 % (ref 0.0–0.2)

## 2022-04-05 LAB — COMPREHENSIVE METABOLIC PANEL
ALT: 139 U/L — ABNORMAL HIGH (ref 0–44)
AST: 63 U/L — ABNORMAL HIGH (ref 15–41)
Albumin: 3.3 g/dL — ABNORMAL LOW (ref 3.5–5.0)
Alkaline Phosphatase: 56 U/L (ref 38–126)
Anion gap: 24 — ABNORMAL HIGH (ref 5–15)
BUN: 219 mg/dL — ABNORMAL HIGH (ref 8–23)
CO2: 15 mmol/L — ABNORMAL LOW (ref 22–32)
Calcium: 9.7 mg/dL (ref 8.9–10.3)
Chloride: 89 mmol/L — ABNORMAL LOW (ref 98–111)
Creatinine, Ser: 17.83 mg/dL — ABNORMAL HIGH (ref 0.61–1.24)
GFR, Estimated: 2 mL/min — ABNORMAL LOW (ref >60)
Glucose, Bld: 102 mg/dL — ABNORMAL HIGH (ref 70–99)
Potassium: 5.4 mmol/L — ABNORMAL HIGH (ref 3.5–5.1)
Sodium: 128 mmol/L — ABNORMAL LOW (ref 135–145)
Total Bilirubin: 1.1 mg/dL (ref 0.3–1.2)
Total Protein: 6.3 g/dL — ABNORMAL LOW (ref 6.5–8.1)

## 2022-04-05 LAB — PHENYTOIN LEVEL, TOTAL: Phenytoin Lvl: <2.5 ug/mL — ABNORMAL LOW (ref 10.0–20.0)

## 2022-04-05 LAB — PHOSPHORUS: Phosphorus: 11.6 mg/dL — ABNORMAL HIGH (ref 2.5–4.6)

## 2022-04-05 LAB — MAGNESIUM: Magnesium: 2.1 mg/dL (ref 1.7–2.4)

## 2022-04-05 LAB — LACTIC ACID, PLASMA: Lactic Acid, Venous: 1.3 mmol/L (ref 0.5–1.9)

## 2022-04-05 MED ORDER — SODIUM CHLORIDE 0.9 % IV BOLUS
250.0000 mL | Freq: Once | INTRAVENOUS | Status: AC
  Administered 2022-04-05: 250 mL via INTRAVENOUS

## 2022-04-05 MED ORDER — PHENYTOIN SODIUM 50 MG/ML IJ SOLN
500.0000 mg | Freq: Once | INTRAMUSCULAR | Status: DC
Start: 2022-04-05 — End: 2022-04-05
  Filled 2022-04-05: qty 10

## 2022-04-05 MED ORDER — SODIUM CHLORIDE 0.9 % IV SOLN
150.0000 mg | Freq: Once | INTRAVENOUS | Status: AC
  Administered 2022-04-05: 150 mg via INTRAVENOUS
  Filled 2022-04-05 (×2): qty 3

## 2022-04-05 NOTE — ED Provider Notes (Signed)
[facility] EMERGENCY DEPARTMENT Provider Note   CSN: [id number] Arrival date & time: 04/05/22  0159     History  Chief Complaint  Patient presents with   Seizures    Anthony Foster is a 81 y.o. male.  81 yo M w/ ESRD not a candidate for dialysis secondary to severe cardiomyopathy. Has been on hospice for most of this year. Started having seizures about a week ago. Started phenytoin yesterday. Had another petit mal seizure tonight but then was having tingling and cramping of extremities so came here for eval. No fevers. No other changes. Discussed and confirmed with caregiver, grandson [name], on phone.    Seizures      Home Medications Prior to Admission medications   Medication Sig Start Date End Date Taking? Authorizing Provider  allopurinol (ZYLOPRIM) 300 MG tablet Take 300 mg by mouth daily. 07/26/16   [provider]  amiodarone (PACERONE) 200 MG tablet Take 1 tablet (200 mg total) by mouth daily. 01/07/22   Amin, Jeanella Flattery, MD  apixaban (ELIQUIS) 2.5 MG TABS tablet Take 1 tablet (2.5 mg total) by mouth daily. 06/10/19   Lelon Perla, MD  atorvastatin (LIPITOR) 80 MG tablet Take 1 tablet (80 mg total) by mouth daily. Patient not taking: Reported on 01/06/2022 09/12/15   Lelon Perla, MD  docusate sodium (COLACE) 100 MG capsule Take 100 mg by mouth daily as needed for mild constipation. 12/05/21   [provider]  EPINEPHrine 0.3 mg/0.3 mL IJ SOAJ injection SMARTSIG:1 pre-filled pen syringe IM Once 11/14/21   [provider]  midodrine (PROAMATINE) 10 MG tablet Take 1 tablet (10 mg total) by mouth 2 (two) times daily with a meal. 09/07/21   Domenic Polite, MD  nitroGLYCERIN (NITROSTAT) 0.4 MG SL tablet Place 1 tablet (0.4 mg total) under the tongue every 5 (five) minutes as needed for chest pain. 01/14/18 01/06/22  Erlene Quan, PA-C  ondansetron (ZOFRAN) 4 MG tablet Take 4 mg by mouth 3 (three) times daily as needed  for nausea/vomiting. 11/14/21   [provider]  PROAIR RESPICLICK 858 (90 Base) MCG/ACT AEPB Inhale 1 puff into the lungs every 4 (four) hours as needed (shortness of breath). 08/02/21   [provider]  senna (SENOKOT) 8.6 MG TABS tablet Take 2 tablets by mouth daily as needed for mild constipation. 12/05/21   [provider]  Torsemide 40 MG TABS Take 40 mg by mouth daily. 09/08/21   Domenic Polite, MD      Allergies    Hymenoptera venom preparations, Lisinopril, Piroxicam, Shellfish allergy, and Amoxicillin-pot clavulanate    Review of Systems   Review of Systems  Neurological:  Positive for seizures.    Physical Exam Updated Vital Signs BP (!) 93/42   Pulse 72   Temp 97.6 F (36.4 C) (Oral)   Resp 15   SpO2 100%  Physical Exam Vitals and nursing note reviewed.  Constitutional:      Appearance: He is well-developed.  HENT:     Head: Normocephalic and atraumatic.  Eyes:     Pupils: Pupils are equal, round, and reactive to light.  Cardiovascular:     Rate and Rhythm: Normal rate.  Pulmonary:     Effort: Pulmonary effort is normal. No respiratory distress.  Abdominal:     General: There is no distension.  Musculoskeletal:        General: Normal range of motion.     Cervical back: Normal range of motion.  Skin:    General: Skin is warm and dry.  Neurological:     Mental Status: He is alert. Mental status is at baseline.     ED Results / Procedures / Treatments   Labs (all labs ordered are listed, but only abnormal results are displayed) Labs Reviewed  CBC WITH DIFFERENTIAL/PLATELET - Abnormal; Notable for the following components:      Result Value   RBC 3.35 (*)    Hemoglobin 10.0 (*)    HCT 27.3 (*)    MCHC 36.6 (*)    Platelets 138 (*)    All other components within normal limits  COMPREHENSIVE METABOLIC PANEL - Abnormal; Notable for the following components:   Sodium 128 (*)    Potassium 5.4 (*)    Chloride 89 (*)    CO2 15 (*)     Glucose, Bld 102 (*)    BUN 219 (*)    Creatinine, Ser 17.83 (*)    Total Protein 6.3 (*)    Albumin 3.3 (*)    AST 63 (*)    ALT 139 (*)    GFR, Estimated 2 (*)    Anion gap 24 (*)    All other components within normal limits  PHENYTOIN LEVEL, TOTAL - Abnormal; Notable for the following components:   Phenytoin Lvl <2.5 (*)    All other components within normal limits  PHOSPHORUS - Abnormal; Notable for the following components:   Phosphorus 11.6 (*)    All other components within normal limits  MAGNESIUM  LACTIC ACID, PLASMA    EKG None  Radiology CT Head Wo Contrast  Result Date: 04/05/2022 CLINICAL DATA:  Seizure, new-onset, no history of trauma EXAM: CT HEAD WITHOUT CONTRAST TECHNIQUE: Contiguous axial images were obtained from the base of the skull through the vertex without intravenous contrast. RADIATION DOSE REDUCTION: This exam was performed according to the departmental dose-optimization program which includes automated exposure control, adjustment of the mA and/or kV according to patient size and/or use of iterative reconstruction technique. COMPARISON:  01/05/2022 FINDINGS: Brain: Normal anatomic configuration. Parenchymal volume loss is commensurate with the patient's age. Mild periventricular white matter changes are present likely reflecting the sequela of small vessel ischemia. No abnormal intra or extra-axial mass lesion or fluid collection. No abnormal mass effect or midline shift. No evidence of acute intracranial hemorrhage or infarct. Ventricular size is normal. Cerebellum unremarkable. Vascular: No asymmetric hyperdense vasculature at the skull base. Skull: Intact Sinuses/Orbits: Paranasal sinuses are clear. Orbits are unremarkable. Other: Mastoid air cells and middle ear cavities are clear. IMPRESSION: No acute intracranial hemorrhage or infarct. Mild senescent change. Electronically Signed   By: Fidela Salisbury M.D.   On: 04/05/2022 03:28   DG Chest Portable 1  View  Result Date: 04/05/2022 CLINICAL DATA:  Pulmonary edema EXAM: PORTABLE CHEST 1 VIEW COMPARISON:  12/30/2021 FINDINGS: The heart size and mediastinal contours are within normal limits. Both lungs are clear. The visualized skeletal structures are unremarkable. Expected location of left chest wall AICD leads. IMPRESSION: No active disease. Expected location of left chest wall AICD leads. Electronically Signed   By: Ulyses Jarred M.D.   On: 04/05/2022 02:54    Procedures .Critical Care  Performed by: Merrily Pew, MD Authorized by: Merrily Pew, MD   Critical care provider statement:    Critical care time (minutes):  30   Critical care was necessary to treat or prevent imminent or life-threatening deterioration of the following conditions:  Metabolic crisis   Critical care  was time spent personally by me on the following activities:  Development of treatment plan with patient or surrogate, discussions with consultants, evaluation of patient's response to treatment, examination of patient, ordering and review of laboratory studies, ordering and review of radiographic studies, ordering and performing treatments and interventions, pulse oximetry, re-evaluation of patient's condition and review of old charts     Medications Ordered in ED Medications  sodium chloride 0.9 % bolus 250 mL (0 mLs Intravenous Stopped 04/05/22 0746)  fosPHENYtoin (CEREBYX) 150 mg PE in sodium chloride 0.9 % 25 mL IVPB (0 mg PE Intravenous Stopped 04/05/22 0746)    ED Course/ Medical Decision Making/ A&P                           Medical Decision Making Problems Addressed: Seizure 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    Details: Significantly elevated  BUN/Cr Low Na/bicarb  Radiology: ordered.  Risk OTC drugs. Prescription drug management. Drug therapy requiring intensive monitoring for toxicity. Decision regarding hospitalization.  Critical Care Total time providing critical care: 30 minutes   Severely uremic. D/w nephrology to see if there were any oral medications that could be helpful but only thing that would help would be dialysis and patient continues to refuse. Suggested a little IVFand continued hospice care.  D/w Neurology about phenytoin being appropriate, recommended IV fosphyenytoin 150 mg PE equivalents and 50 TID phenytoin at home. Ativan IM PRN for breakthrough, will d/w hospice regarding same.  Never received call back from hospice.   Updated patient and grandson on findings in the ED and neuro/nephro recommendations. Both maintain that they do not want to try dialysis again. I made sure they were aware that this is going to kill him eventually but unable to know when or the sequelae of how it would happen. They understood.    Final Clinical Impression(s) / ED Diagnoses Final diagnoses:  Seizure ([place])  Uremic encephalopathy    Rx / DC Orders ED Discharge Orders     None         Janmichael Giraud, Corene Cornea, MD 04/05/22 [id number]

## 2022-04-05 NOTE — ED Notes (Signed)
PTAR at bedside 

## 2022-04-05 NOTE — ED Notes (Signed)
Patient's caregiver verbalizes understanding of discharge instructions. Opportunity for questioning and answers were provided. Pt discharged from ED.

## 2022-04-05 NOTE — ED Notes (Signed)
PTAR Called 

## 2022-04-05 NOTE — ED Notes (Signed)
Pt's caregiver has been notified about pt discharge and PT[facility] transfer.

## 2022-04-05 NOTE — ED Notes (Addendum)
Dr. Trula Slade aware of hypotension (has been hypotensive); okay to continue with discharge as planned

## 2022-04-05 NOTE — ED Triage Notes (Signed)
Per EMS patient had a witnessed seizure from hospice nurse. Patient started phenytoin but EMS reports that hospice nurse has not seen improvement. Patient reports generalized "tingling" starting today and having severe generalized pain

## 2022-04-05 NOTE — ED Notes (Signed)
Patient transported to CT 

## 2022-04-08 DIAGNOSIS — I132 Hypertensive heart and chronic kidney disease with heart failure and with stage 5 chronic kidney disease, or end stage renal disease: Secondary | ICD-10-CM | POA: Diagnosis not present

## 2022-04-08 DIAGNOSIS — I714 Abdominal aortic aneurysm, without rupture, unspecified: Secondary | ICD-10-CM | POA: Diagnosis not present

## 2022-04-08 DIAGNOSIS — I4891 Unspecified atrial fibrillation: Secondary | ICD-10-CM | POA: Diagnosis not present

## 2022-04-08 DIAGNOSIS — N185 Chronic kidney disease, stage 5: Secondary | ICD-10-CM | POA: Diagnosis not present

## 2022-04-08 DIAGNOSIS — J449 Chronic obstructive pulmonary disease, unspecified: Secondary | ICD-10-CM | POA: Diagnosis not present

## 2022-04-08 DIAGNOSIS — I5040 Unspecified combined systolic (congestive) and diastolic (congestive) heart failure: Secondary | ICD-10-CM | POA: Diagnosis not present

## 2022-04-09 DIAGNOSIS — I714 Abdominal aortic aneurysm, without rupture, unspecified: Secondary | ICD-10-CM | POA: Diagnosis not present

## 2022-04-09 DIAGNOSIS — I5040 Unspecified combined systolic (congestive) and diastolic (congestive) heart failure: Secondary | ICD-10-CM | POA: Diagnosis not present

## 2022-04-09 DIAGNOSIS — J449 Chronic obstructive pulmonary disease, unspecified: Secondary | ICD-10-CM | POA: Diagnosis not present

## 2022-04-09 DIAGNOSIS — N185 Chronic kidney disease, stage 5: Secondary | ICD-10-CM | POA: Diagnosis not present

## 2022-04-09 DIAGNOSIS — I132 Hypertensive heart and chronic kidney disease with heart failure and with stage 5 chronic kidney disease, or end stage renal disease: Secondary | ICD-10-CM | POA: Diagnosis not present

## 2022-04-09 DIAGNOSIS — I4891 Unspecified atrial fibrillation: Secondary | ICD-10-CM | POA: Diagnosis not present

## 2022-04-10 ENCOUNTER — Telehealth: Payer: Self-pay | Admitting: *Deleted

## 2022-04-10 DIAGNOSIS — R000 Tachycardia, unspecified: Secondary | ICD-10-CM | POA: Diagnosis not present

## 2022-04-10 DIAGNOSIS — R55 Syncope and collapse: Secondary | ICD-10-CM | POA: Diagnosis not present

## 2022-04-10 DIAGNOSIS — I959 Hypotension, unspecified: Secondary | ICD-10-CM | POA: Diagnosis not present

## 2022-04-10 DIAGNOSIS — J449 Chronic obstructive pulmonary disease, unspecified: Secondary | ICD-10-CM | POA: Diagnosis not present

## 2022-04-10 DIAGNOSIS — N185 Chronic kidney disease, stage 5: Secondary | ICD-10-CM | POA: Diagnosis not present

## 2022-04-10 DIAGNOSIS — I132 Hypertensive heart and chronic kidney disease with heart failure and with stage 5 chronic kidney disease, or end stage renal disease: Secondary | ICD-10-CM | POA: Diagnosis not present

## 2022-04-10 DIAGNOSIS — R001 Bradycardia, unspecified: Secondary | ICD-10-CM | POA: Diagnosis not present

## 2022-04-10 DIAGNOSIS — R404 Transient alteration of awareness: Secondary | ICD-10-CM | POA: Diagnosis not present

## 2022-04-10 DIAGNOSIS — I5040 Unspecified combined systolic (congestive) and diastolic (congestive) heart failure: Secondary | ICD-10-CM | POA: Diagnosis not present

## 2022-04-10 DIAGNOSIS — I714 Abdominal aortic aneurysm, without rupture, unspecified: Secondary | ICD-10-CM | POA: Diagnosis not present

## 2022-04-10 DIAGNOSIS — I4891 Unspecified atrial fibrillation: Secondary | ICD-10-CM | POA: Diagnosis not present

## 2022-04-10 NOTE — Telephone Encounter (Signed)
     Patient  visit on 04/05/2022  at [facility]  was for seizure   Have you been able to follow up with your primary care physician? Hospice patient is followed by their staff  The patient was able to obtain any needed medicine or equipment.  Are there diet recommendations that you are having difficulty following?  Patient expresses understanding of discharge instructions and education provided has no other needs at this time.    [facility] [phone] [address] , [place] [address] Email : [email or web address]

## 2022-04-11 ENCOUNTER — Encounter (HOSPITAL_COMMUNITY): Payer: Self-pay

## 2022-04-11 ENCOUNTER — Emergency Department (HOSPITAL_COMMUNITY)

## 2022-04-11 ENCOUNTER — Inpatient Hospital Stay (HOSPITAL_COMMUNITY)
Admission: EM | Admit: 2022-04-11 | Discharge: 2022-04-24 | DRG: 640 | Disposition: E | Source: Hospice | Attending: Internal Medicine | Admitting: Internal Medicine

## 2022-04-11 DIAGNOSIS — I5040 Unspecified combined systolic (congestive) and diastolic (congestive) heart failure: Secondary | ICD-10-CM | POA: Diagnosis not present

## 2022-04-11 DIAGNOSIS — L89151 Pressure ulcer of sacral region, stage 1: Secondary | ICD-10-CM | POA: Diagnosis present

## 2022-04-11 DIAGNOSIS — E872 Acidosis, unspecified: Secondary | ICD-10-CM | POA: Diagnosis present

## 2022-04-11 DIAGNOSIS — Y712 Prosthetic and other implants, materials and accessory cardiovascular devices associated with adverse incidents: Secondary | ICD-10-CM | POA: Diagnosis present

## 2022-04-11 DIAGNOSIS — R64 Cachexia: Secondary | ICD-10-CM | POA: Diagnosis present

## 2022-04-11 DIAGNOSIS — G9349 Other encephalopathy: Secondary | ICD-10-CM | POA: Diagnosis present

## 2022-04-11 DIAGNOSIS — E7800 Pure hypercholesterolemia, unspecified: Secondary | ICD-10-CM | POA: Diagnosis present

## 2022-04-11 DIAGNOSIS — I255 Ischemic cardiomyopathy: Secondary | ICD-10-CM | POA: Diagnosis present

## 2022-04-11 DIAGNOSIS — I5042 Chronic combined systolic (congestive) and diastolic (congestive) heart failure: Secondary | ICD-10-CM | POA: Diagnosis present

## 2022-04-11 DIAGNOSIS — I9589 Other hypotension: Secondary | ICD-10-CM | POA: Diagnosis present

## 2022-04-11 DIAGNOSIS — I950 Idiopathic hypotension: Secondary | ICD-10-CM | POA: Insufficient documentation

## 2022-04-11 DIAGNOSIS — N186 End stage renal disease: Secondary | ICD-10-CM | POA: Diagnosis not present

## 2022-04-11 DIAGNOSIS — R627 Adult failure to thrive: Secondary | ICD-10-CM | POA: Diagnosis present

## 2022-04-11 DIAGNOSIS — Z95810 Presence of automatic (implantable) cardiac defibrillator: Secondary | ICD-10-CM

## 2022-04-11 DIAGNOSIS — Z833 Family history of diabetes mellitus: Secondary | ICD-10-CM

## 2022-04-11 DIAGNOSIS — I120 Hypertensive chronic kidney disease with stage 5 chronic kidney disease or end stage renal disease: Secondary | ICD-10-CM | POA: Diagnosis not present

## 2022-04-11 DIAGNOSIS — D62 Acute posthemorrhagic anemia: Secondary | ICD-10-CM | POA: Diagnosis present

## 2022-04-11 DIAGNOSIS — G40909 Epilepsy, unspecified, not intractable, without status epilepticus: Secondary | ICD-10-CM | POA: Diagnosis present

## 2022-04-11 DIAGNOSIS — N19 Unspecified kidney failure: Secondary | ICD-10-CM | POA: Insufficient documentation

## 2022-04-11 DIAGNOSIS — T82199A Other mechanical complication of unspecified cardiac device, initial encounter: Secondary | ICD-10-CM | POA: Diagnosis present

## 2022-04-11 DIAGNOSIS — I2489 Other forms of acute ischemic heart disease: Secondary | ICD-10-CM | POA: Diagnosis present

## 2022-04-11 DIAGNOSIS — Z8679 Personal history of other diseases of the circulatory system: Secondary | ICD-10-CM

## 2022-04-11 DIAGNOSIS — F17290 Nicotine dependence, other tobacco product, uncomplicated: Secondary | ICD-10-CM | POA: Diagnosis present

## 2022-04-11 DIAGNOSIS — Z515 Encounter for palliative care: Secondary | ICD-10-CM | POA: Diagnosis not present

## 2022-04-11 DIAGNOSIS — Z681 Body mass index (BMI) 19 or less, adult: Secondary | ICD-10-CM

## 2022-04-11 DIAGNOSIS — Z7901 Long term (current) use of anticoagulants: Secondary | ICD-10-CM

## 2022-04-11 DIAGNOSIS — E43 Unspecified severe protein-calorie malnutrition: Secondary | ICD-10-CM | POA: Diagnosis present

## 2022-04-11 DIAGNOSIS — E871 Hypo-osmolality and hyponatremia: Secondary | ICD-10-CM | POA: Diagnosis present

## 2022-04-11 DIAGNOSIS — L8990 Pressure ulcer of unspecified site, unspecified stage: Secondary | ICD-10-CM | POA: Insufficient documentation

## 2022-04-11 DIAGNOSIS — Z66 Do not resuscitate: Secondary | ICD-10-CM | POA: Diagnosis present

## 2022-04-11 DIAGNOSIS — K922 Gastrointestinal hemorrhage, unspecified: Secondary | ICD-10-CM | POA: Diagnosis present

## 2022-04-11 DIAGNOSIS — I714 Abdominal aortic aneurysm, without rupture, unspecified: Secondary | ICD-10-CM | POA: Diagnosis not present

## 2022-04-11 DIAGNOSIS — I4891 Unspecified atrial fibrillation: Secondary | ICD-10-CM | POA: Diagnosis not present

## 2022-04-11 DIAGNOSIS — Z79899 Other long term (current) drug therapy: Secondary | ICD-10-CM

## 2022-04-11 DIAGNOSIS — E875 Hyperkalemia: Secondary | ICD-10-CM | POA: Diagnosis not present

## 2022-04-11 DIAGNOSIS — I7143 Infrarenal abdominal aortic aneurysm, without rupture: Secondary | ICD-10-CM | POA: Diagnosis present

## 2022-04-11 DIAGNOSIS — I482 Chronic atrial fibrillation, unspecified: Secondary | ICD-10-CM | POA: Diagnosis present

## 2022-04-11 DIAGNOSIS — I2510 Atherosclerotic heart disease of native coronary artery without angina pectoris: Secondary | ICD-10-CM | POA: Diagnosis present

## 2022-04-11 DIAGNOSIS — Z8249 Family history of ischemic heart disease and other diseases of the circulatory system: Secondary | ICD-10-CM

## 2022-04-11 DIAGNOSIS — I132 Hypertensive heart and chronic kidney disease with heart failure and with stage 5 chronic kidney disease, or end stage renal disease: Secondary | ICD-10-CM | POA: Diagnosis present

## 2022-04-11 DIAGNOSIS — J449 Chronic obstructive pulmonary disease, unspecified: Secondary | ICD-10-CM | POA: Diagnosis not present

## 2022-04-11 DIAGNOSIS — N179 Acute kidney failure, unspecified: Secondary | ICD-10-CM | POA: Diagnosis present

## 2022-04-11 DIAGNOSIS — N185 Chronic kidney disease, stage 5: Secondary | ICD-10-CM | POA: Diagnosis not present

## 2022-04-11 DIAGNOSIS — M549 Dorsalgia, unspecified: Secondary | ICD-10-CM | POA: Diagnosis present

## 2022-04-11 DIAGNOSIS — Z992 Dependence on renal dialysis: Secondary | ICD-10-CM | POA: Diagnosis not present

## 2022-04-11 LAB — COMPREHENSIVE METABOLIC PANEL
ALT: 95 U/L — ABNORMAL HIGH (ref 0–44)
AST: 63 U/L — ABNORMAL HIGH (ref 15–41)
Albumin: 2.9 g/dL — ABNORMAL LOW (ref 3.5–5.0)
Alkaline Phosphatase: 53 U/L (ref 38–126)
Anion gap: 27 — ABNORMAL HIGH (ref 5–15)
BUN: 265 mg/dL — ABNORMAL HIGH (ref 8–23)
CO2: 12 mmol/L — ABNORMAL LOW (ref 22–32)
Calcium: 8.9 mg/dL (ref 8.9–10.3)
Chloride: 93 mmol/L — ABNORMAL LOW (ref 98–111)
Creatinine, Ser: 20.07 mg/dL — ABNORMAL HIGH (ref 0.61–1.24)
GFR, Estimated: 2 mL/min — ABNORMAL LOW (ref >60)
Glucose, Bld: 153 mg/dL — ABNORMAL HIGH (ref 70–99)
Potassium: 6 mmol/L — ABNORMAL HIGH (ref 3.5–5.1)
Sodium: 132 mmol/L — ABNORMAL LOW (ref 135–145)
Total Bilirubin: 1.1 mg/dL (ref 0.3–1.2)
Total Protein: 5.6 g/dL — ABNORMAL LOW (ref 6.5–8.1)

## 2022-04-11 LAB — CBC WITH DIFFERENTIAL/PLATELET
Abs Immature Granulocytes: 0.12 10*3/uL — ABNORMAL HIGH (ref 0.00–0.07)
Basophils Absolute: 0 10*3/uL (ref 0.0–0.1)
Basophils Relative: 0 %
Eosinophils Absolute: 0 10*3/uL (ref 0.0–0.5)
Eosinophils Relative: 0 %
HCT: 20.5 % — ABNORMAL LOW (ref 39.0–52.0)
Hemoglobin: 7.6 g/dL — ABNORMAL LOW (ref 13.0–17.0)
Immature Granulocytes: 1 %
Lymphocytes Relative: 5 %
Lymphs Abs: 0.6 10*3/uL — ABNORMAL LOW (ref 0.7–4.0)
MCH: 29.5 pg (ref 26.0–34.0)
MCHC: 37.1 g/dL — ABNORMAL HIGH (ref 30.0–36.0)
MCV: 79.5 fL — ABNORMAL LOW (ref 80.0–100.0)
Monocytes Absolute: 0.7 10*3/uL (ref 0.1–1.0)
Monocytes Relative: 5 %
Neutro Abs: 10.9 10*3/uL — ABNORMAL HIGH (ref 1.7–7.7)
Neutrophils Relative %: 89 %
Platelets: 135 10*3/uL — ABNORMAL LOW (ref 150–400)
RBC: 2.58 MIL/uL — ABNORMAL LOW (ref 4.22–5.81)
RDW: 15 % (ref 11.5–15.5)
WBC: 12.3 10*3/uL — ABNORMAL HIGH (ref 4.0–10.5)
nRBC: 0 % (ref 0.0–0.2)

## 2022-04-11 LAB — GLUCOSE, CAPILLARY: Glucose-Capillary: 159 mg/dL — ABNORMAL HIGH (ref 70–99)

## 2022-04-11 LAB — PREPARE RBC (CROSSMATCH)

## 2022-04-11 LAB — LACTIC ACID, PLASMA
Lactic Acid, Venous: 2.5 mmol/L (ref 0.5–1.9)
Lactic Acid, Venous: 2 mmol/L (ref 0.5–1.9)

## 2022-04-11 LAB — I-STAT VENOUS BLOOD GAS, ED
Acid-base deficit: 14 mmol/L — ABNORMAL HIGH (ref 0.0–2.0)
Bicarbonate: 11.4 mmol/L — ABNORMAL LOW (ref 20.0–28.0)
Calcium, Ion: 0.87 mmol/L — CL (ref 1.15–1.40)
HCT: 26 % — ABNORMAL LOW (ref 39.0–52.0)
Hemoglobin: 8.8 g/dL — ABNORMAL LOW (ref 13.0–17.0)
O2 Saturation: 100 %
Potassium: 6.5 mmol/L (ref 3.5–5.1)
Sodium: 125 mmol/L — ABNORMAL LOW (ref 135–145)
TCO2: 12 mmol/L — ABNORMAL LOW (ref 22–32)
pCO2, Ven: 23.4 mmHg — ABNORMAL LOW (ref 44–60)
pH, Ven: 7.295 (ref 7.25–7.43)
pO2, Ven: 188 mmHg — ABNORMAL HIGH (ref 32–45)

## 2022-04-11 LAB — TROPONIN I (HIGH SENSITIVITY)
Troponin I (High Sensitivity): 106 ng/L (ref <18)
Troponin I (High Sensitivity): 91 ng/L — ABNORMAL HIGH (ref <18)

## 2022-04-11 LAB — BRAIN NATRIURETIC PEPTIDE: B Natriuretic Peptide: 557.5 pg/mL — ABNORMAL HIGH (ref 0.0–100.0)

## 2022-04-11 LAB — CBG MONITORING, ED
Glucose-Capillary: 115 mg/dL — ABNORMAL HIGH (ref 70–99)
Glucose-Capillary: 141 mg/dL — ABNORMAL HIGH (ref 70–99)

## 2022-04-11 LAB — PHENYTOIN LEVEL, TOTAL: Phenytoin Lvl: 6.3 ug/mL — ABNORMAL LOW (ref 10.0–20.0)

## 2022-04-11 LAB — ABO/RH: ABO/RH(D): O POS

## 2022-04-11 MED ORDER — SODIUM CHLORIDE 0.9 % IV SOLN
10.0000 mL/h | Freq: Once | INTRAVENOUS | Status: AC
  Administered 2022-04-11: 10 mL/h via INTRAVENOUS

## 2022-04-11 MED ORDER — LORAZEPAM 2 MG/ML PO CONC: 1.0000 mg | ORAL | Status: DC | PRN

## 2022-04-11 MED ORDER — SODIUM ZIRCONIUM CYCLOSILICATE 10 G PO PACK
10.0000 g | PACK | Freq: Once | ORAL | Status: AC
  Administered 2022-04-11: 10 g via ORAL
  Filled 2022-04-11: qty 1

## 2022-04-11 MED ORDER — ORAL CARE MOUTH RINSE: 15.0000 mL | OROMUCOSAL | Status: DC | PRN

## 2022-04-11 MED ORDER — HALOPERIDOL LACTATE 2 MG/ML PO CONC: 0.5000 mg | ORAL | Status: DC | PRN

## 2022-04-11 MED ORDER — POLYETHYLENE GLYCOL 3350 17 G PO PACK: 17.0000 g | PACK | Freq: Every day | ORAL | Status: DC | PRN

## 2022-04-11 MED ORDER — LORAZEPAM 1 MG PO TABS: 1.0000 mg | ORAL_TABLET | ORAL | Status: DC | PRN

## 2022-04-11 MED ORDER — GLYCOPYRROLATE 0.2 MG/ML IJ SOLN: 0.2000 mg | INTRAMUSCULAR | Status: DC | PRN

## 2022-04-11 MED ORDER — AMIODARONE HCL 200 MG PO TABS: 200.0000 mg | ORAL_TABLET | Freq: Every day | ORAL | Status: DC

## 2022-04-11 MED ORDER — POLYVINYL ALCOHOL 1.4 % OP SOLN: 1.0000 [drp] | Freq: Four times a day (QID) | OPHTHALMIC | Status: DC | PRN

## 2022-04-11 MED ORDER — ONDANSETRON HCL 4 MG/2ML IJ SOLN: 4.0000 mg | Freq: Four times a day (QID) | INTRAMUSCULAR | Status: DC | PRN

## 2022-04-11 MED ORDER — ORAL CARE MOUTH RINSE
15.0000 mL | OROMUCOSAL | Status: DC
  Administered 2022-04-11 – 2022-04-12 (×2): 15 mL via OROMUCOSAL

## 2022-04-11 MED ORDER — INSULIN ASPART 100 UNIT/ML IJ SOLN: 0.0000 [IU] | Freq: Every day | INTRAMUSCULAR | Status: DC

## 2022-04-11 MED ORDER — PHENYTOIN 50 MG PO CHEW
50.0000 mg | CHEWABLE_TABLET | Freq: Three times a day (TID) | ORAL | Status: DC
  Filled 2022-04-11: qty 1

## 2022-04-11 MED ORDER — LORAZEPAM 2 MG/ML IJ SOLN: 2.0000 mg | INTRAMUSCULAR | Status: DC | PRN

## 2022-04-11 MED ORDER — ONDANSETRON HCL 4 MG PO TABS: 4.0000 mg | ORAL_TABLET | Freq: Three times a day (TID) | ORAL | Status: DC | PRN

## 2022-04-11 MED ORDER — ONDANSETRON 4 MG PO TBDP: 4.0000 mg | ORAL_TABLET | Freq: Four times a day (QID) | ORAL | Status: DC | PRN

## 2022-04-11 MED ORDER — DOCUSATE SODIUM 100 MG PO CAPS: 100.0000 mg | ORAL_CAPSULE | Freq: Every day | ORAL | Status: DC | PRN

## 2022-04-11 MED ORDER — SENNA 8.6 MG PO TABS: 2.0000 | ORAL_TABLET | Freq: Every day | ORAL | Status: DC | PRN

## 2022-04-11 MED ORDER — ACETAMINOPHEN 650 MG RE SUPP: 650.0000 mg | Freq: Four times a day (QID) | RECTAL | Status: DC | PRN

## 2022-04-11 MED ORDER — SODIUM BICARBONATE 8.4 % IV SOLN
50.0000 meq | Freq: Once | INTRAVENOUS | Status: AC
  Administered 2022-04-11: 50 meq via INTRAVENOUS
  Filled 2022-04-11: qty 50

## 2022-04-11 MED ORDER — DEXTROSE 50 % IV SOLN
1.0000 | Freq: Once | INTRAVENOUS | Status: AC
  Administered 2022-04-11: 50 mL via INTRAVENOUS
  Filled 2022-04-11: qty 50

## 2022-04-11 MED ORDER — MORPHINE SULFATE (CONCENTRATE) 10 MG/0.5ML PO SOLN
5.0000 mg | ORAL | Status: DC | PRN
  Administered 2022-04-11: 5 mg via ORAL
  Filled 2022-04-11: qty 0.5

## 2022-04-11 MED ORDER — ACETAMINOPHEN 325 MG PO TABS: 650.0000 mg | ORAL_TABLET | Freq: Four times a day (QID) | ORAL | Status: DC | PRN

## 2022-04-11 MED ORDER — ALBUTEROL SULFATE (2.5 MG/3ML) 0.083% IN NEBU
10.0000 mg | INHALATION_SOLUTION | Freq: Once | RESPIRATORY_TRACT | Status: AC
  Administered 2022-04-11: 10 mg via RESPIRATORY_TRACT
  Filled 2022-04-11: qty 12

## 2022-04-11 MED ORDER — GLYCOPYRROLATE 1 MG PO TABS: 1.0000 mg | ORAL_TABLET | ORAL | Status: DC | PRN

## 2022-04-11 MED ORDER — INSULIN ASPART 100 UNIT/ML IJ SOLN: 0.0000 [IU] | Freq: Three times a day (TID) | INTRAMUSCULAR | Status: DC

## 2022-04-11 MED ORDER — ATORVASTATIN CALCIUM 40 MG PO TABS: 80.0000 mg | ORAL_TABLET | Freq: Every day | ORAL | Status: DC

## 2022-04-11 MED ORDER — CALCIUM GLUCONATE-NACL 1-0.675 GM/50ML-% IV SOLN
1.0000 g | Freq: Once | INTRAVENOUS | Status: AC
  Administered 2022-04-11: 1000 mg via INTRAVENOUS
  Filled 2022-04-11: qty 50

## 2022-04-11 MED ORDER — BIOTENE DRY MOUTH MT LIQD: 15.0000 mL | OROMUCOSAL | Status: DC | PRN

## 2022-04-11 MED ORDER — LORAZEPAM 2 MG/ML IJ SOLN
1.0000 mg | INTRAMUSCULAR | Status: DC | PRN
  Administered 2022-04-11 – 2022-04-13 (×6): 1 mg via INTRAVENOUS
  Filled 2022-04-11 (×6): qty 1

## 2022-04-11 MED ORDER — HALOPERIDOL 0.5 MG PO TABS: 0.5000 mg | ORAL_TABLET | ORAL | Status: DC | PRN

## 2022-04-11 MED ORDER — ALBUTEROL SULFATE (2.5 MG/3ML) 0.083% IN NEBU: 3.0000 mL | INHALATION_SOLUTION | RESPIRATORY_TRACT | Status: DC | PRN

## 2022-04-11 MED ORDER — MIDODRINE HCL 5 MG PO TABS
10.0000 mg | ORAL_TABLET | Freq: Three times a day (TID) | ORAL | Status: DC
  Administered 2022-04-11: 10 mg via ORAL
  Filled 2022-04-11: qty 2

## 2022-04-11 MED ORDER — STERILE WATER FOR INJECTION IV SOLN
INTRAVENOUS | Status: DC
  Filled 2022-04-11: qty 1000

## 2022-04-11 MED ORDER — HALOPERIDOL LACTATE 5 MG/ML IJ SOLN
0.5000 mg | INTRAMUSCULAR | Status: DC | PRN
  Administered 2022-04-11 – 2022-04-12 (×2): 0.5 mg via INTRAVENOUS
  Filled 2022-04-11 (×2): qty 1

## 2022-04-11 MED ORDER — INSULIN ASPART 100 UNIT/ML IV SOLN
10.0000 [IU] | Freq: Once | INTRAVENOUS | Status: AC
  Administered 2022-04-11: 10 [IU] via INTRAVENOUS

## 2022-04-11 MED ORDER — FENTANYL CITRATE PF 50 MCG/ML IJ SOSY
12.5000 ug | PREFILLED_SYRINGE | INTRAMUSCULAR | Status: DC | PRN
  Administered 2022-04-11: 12.5 ug via INTRAVENOUS
  Filled 2022-04-11: qty 1

## 2022-04-11 MED ORDER — MORPHINE SULFATE (CONCENTRATE) 10 MG/0.5ML PO SOLN: 5.0000 mg | ORAL | Status: DC | PRN

## 2022-04-11 NOTE — H&P (Addendum)
NAME:  Anthony Foster, MRN:  [id number], DOB:  1940/07/13, LOS: 0 ADMISSION DATE:  04/14/2022, CONSULTATION DATE:  04/01/2022 REFERRING MD:  Ralene Bathe, CHIEF COMPLAINT:  confusion   History of Present Illness:  Anthony Foster is a 81 y.o. 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  He was admitted at that time with hyperkalemia, dialysis was pursued, but he became hypotensive and did not tolerate.  Pt decided against further dialysis and nephrology in agreement that he would not tolerate long term dialysis and he was discharged home on hospice.   Today he presented after his mental status worsened.  Anthony Foster is at the bedside and reports worsening mental status over the last few days, was minimally responsive and slumped over in the chair on EMS arrival.  There was also significant bright red blood in the commode.    In the ED, labs significant for K 6.0, BUN 265, creatinine 20, Hgb 7.6 (10.0 six days ago), lactic acid 2.5.  CT abd/pelvis with sigmoid colin wall thickening  and unchanged infrarenal AAA.  MAP's in 97's, he is on midodrine and runs low at baseline per family. Pt was initially saying he wanted to remain full code and would consider dialysis so PCCM consulted.  He was given bicarb, lokelma, albuterol and calcium and transfused 1 unit PRBC's  Pertinent  Medical History   has a past medical history of AAA (abdominal aortic aneurysm) ([place]), Atrial fibrillation ([place]), CHF (congestive heart failure) ([place]), Coronary artery disease, Degenerative joint disease, Full dentures, Gout, Hypercholesteremia, Hypertension, ICD (implantable cardioverter-defibrillator) battery depletion, Ischemic cardiomyopathy, Pneumonia, Renal insufficiency, and Wears glasses.   Significant Hospital Events: Including procedures, antibiotic start and stop dates in addition to other pertinent events   10/19 presented  with worsening renal failure in the setting of known CKD stage V, on hospice and not a dialysis candidate  Interim History / Subjective:  Pt awake, moderately confused but answers questions.  Grandson who is HCPOA at the bedside.    Objective   Blood pressure (!) 115/38, pulse (!) 56, resp. rate 12, height '5\' 8"'$  (1.727 m), weight 61.1 kg, SpO2 100 %.       No intake or output data in the 24 hours ending 03/31/2022 0350 Filed Weights   04/04/2022 0021  Weight: 61.1 kg   General:  thin, poorly nourished elderly M in no acute distress HEENT: MM pink/dry, sclera anicteric Neuro: awake, hard of hearing, intermittently confused, moving all extremities CV: s1s2 rrr, no m/r/g PULM:  clear bilaterally on RA without respiratory distress GI: soft, non-tender Extremities: warm/dry, decreased muscle bulk and tone, no edema  Skin: no rashes or lesions   Resolved Hospital Problem list    Assessment & Plan:    Hyperkalemia  Acute on chronic CKD stage V renal failure Metabolic Acidosis Lactic Acidosis -Pt has been full code on Hospice for three months, per 7/17 nephrology note pt would not tolerate long term dialysis  -On discussion pt indicates he does not want dialysis, Grandson who is POA agrees but is concerned that pt is in pain and would like to try medical management.  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 -increase Midodrine to '10mg'$  tid -MAP goal 55 -would avoid pressors in the setting of ESRD without dialysis, palliative consult and hopefully transition to comfort focused care  HFrEF ICD Atrial  Fibrillation Appears volume down -hold eliquis, continue amiodarone    Protein Calorie Malnutrition Failure to Thrive POA -speech and nutrition consult      Best Practice (right click and "Reselect all SmartList Selections" daily)   Diet/type: NPO pending speech eval DVT prophylaxis: SCD GI prophylaxis: PPI Lines: N/A Foley:  N/A Code Status:  DNR Last date of multidisciplinary goals of care discussion [10/19 discussed [place] with patient and grandson, DNR]  Labs   CBC: Recent Labs  Lab 04/05/22 0258 04/17/2022 0027 04/12/2022 0210  WBC 7.7 12.3*  --   NEUTROABS 6.1 10.9*  --   HGB 10.0* 7.6* 8.8*  HCT 27.3* 20.5* 26.0*  MCV 81.5 79.5*  --   PLT 138* 135*  --     Basic Metabolic Panel: Recent Labs  Lab 04/05/22 0258 03/30/2022 0027 04/22/2022 0210  NA 128* 132* 125*  K 5.4* 6.0* 6.5*  CL 89* 93*  --   CO2 15* 12*  --   GLUCOSE 102* 153*  --   BUN 219* 265*  --   CREATININE 17.83* 20.07*  --   CALCIUM 9.7 8.9  --   MG 2.1  --   --   PHOS 11.6*  --   --    GFR: Estimated Creatinine Clearance: 2.5 mL/min (A) (by C-G formula based on SCr of 20.07 mg/dL (H)). Recent Labs  Lab 04/05/22 0258 04/05/22 0526 03/31/2022 0027 04/10/2022 0243  WBC 7.7  --  12.3*  --   LATICACIDVEN  --  1.3 2.5* 2.0*    Liver Function Tests: Recent Labs  Lab 04/05/22 0258 03/31/2022 0027  AST 63* 63*  ALT 139* 95*  ALKPHOS 56 53  BILITOT 1.1 1.1  PROT 6.3* 5.6*  ALBUMIN 3.3* 2.9*   No results for input(s): "LIPASE", "AMYLASE" in the last 168 hours. No results for input(s): "AMMONIA" in the last 168 hours.  ABG    Component Value Date/Time   HCO3 11.4 (L) 04/23/2022 0210   TCO2 12 (L) 04/16/2022 0210   ACIDBASEDEF 14.0 (H) 03/28/2022 0210   O2SAT 100 04/21/2022 0210     Coagulation Profile: No results for input(s): "INR", "PROTIME" in the last 168 hours.  Cardiac Enzymes: No results for input(s): "CKTOTAL", "CKMB", "CKMBINDEX", "TROPONINI" in the last 168  hours.  HbA1C: Hgb A1c MFr Bld  Date/Time Value Ref Range Status  07/08/2015 04:45 AM 6.1 (H) 4.8 - 5.6 % Final    Comment:    (NOTE)         Pre-diabetes: 5.7 - 6.4         Diabetes: >6.4         Glycemic control for adults with diabetes: <7.0     CBG: Recent Labs  Lab 04/16/2022 0024  GLUCAP 141*    Review of Systems:   Unable to obtain 2/2 encephalopathy  Past Medical History:  He,  has a past medical history of AAA (abdominal aortic aneurysm) ([place]), Atrial fibrillation ([place]), CHF (congestive heart failure) ([place]), Coronary artery disease, Degenerative joint disease, Full dentures, Gout, Hypercholesteremia, Hypertension, ICD (implantable cardioverter-defibrillator) battery depletion, Ischemic cardiomyopathy, Pneumonia, Renal insufficiency, and Wears glasses.   Surgical History:   Past  Surgical History:  Procedure Laterality Date   AV FISTULA PLACEMENT Right 11/26/2018   Procedure:     Right arm Brachiocephalic Fistula Creation   ;  Surgeon: Waynetta Sandy, MD;  Location: [facility];  Service: Vascular;  Laterality: Right;   BACK SURGERY     CARDIAC SURGERY     CATARACT EXTRACTION W/ INTRAOCULAR LENS  IMPLANT, BILATERAL     CENTRAL LINE INSERTION  08/28/2021   Procedure: CENTRAL LINE INSERTION;  Surgeon: Jolaine Artist, MD;  Location: [facility];  Service: Cardiovascular;;   COLONOSCOPY W/ BIOPSIES AND POLYPECTOMY     MASS EXCISION     neck   MULTIPLE TOOTH EXTRACTIONS     PACEMAKER PLACEMENT     RIGHT HEART CATH N/A 08/28/2021   Procedure: RIGHT HEART CATH;  Surgeon: Jolaine Artist, MD;  Location: [facility];  Service: Cardiovascular;  Laterality: N/A;   SHOULDER SURGERY     left   TOTAL SHOULDER REPLACEMENT     WRIST SURGERY       Social History:   reports that he has been smoking cigars. He has never used smokeless tobacco. He reports that he does not currently use alcohol. He reports that he does not use drugs.   Family History:  His  family history includes Diabetes in his sister; Heart disease in an other family member.   Allergies Allergies  Allergen Reactions   Hymenoptera Venom Preparations Hives    Reaction to wasps   Lisinopril Other (See Comments)    angioedema   Piroxicam Other (See Comments)    burn   Shellfish Allergy Swelling   Amoxicillin-Pot Clavulanate Rash    Did it involve swelling of the face/tongue/throat, SOB, or low BP? No Did it involve sudden or severe rash/hives, skin peeling, or any reaction on the inside of your mouth or nose? Yes Did you need to seek medical attention at a hospital or doctor's office? Yes When did it last happen?      over 10 years If all above answers are "NO", may proceed with cephalosporin use.      Home Medications  Prior to Admission medications   Medication Sig Start Date End Date Taking? Authorizing Provider  allopurinol (ZYLOPRIM) 300 MG tablet Take 300 mg by mouth daily. 07/26/16   [provider]  amiodarone (PACERONE) 200 MG tablet Take 1 tablet (200 mg total) by mouth daily. 01/07/22   Amin, Jeanella Flattery, MD  apixaban (ELIQUIS) 2.5 MG TABS tablet Take 1 tablet (2.5 mg total) by mouth daily. 06/10/19   Lelon Perla, MD  atorvastatin (LIPITOR) 80 MG tablet Take 1 tablet (80 mg total) by mouth daily. Patient not taking: Reported on 01/06/2022 09/12/15   Lelon Perla, MD  docusate sodium (COLACE) 100 MG capsule Take 100 mg by mouth daily as needed for mild constipation. 12/05/21   [provider]  EPINEPHrine 0.3 mg/0.3 mL IJ SOAJ injection SMARTSIG:1 pre-filled pen syringe IM Once 11/14/21   [provider]  midodrine (PROAMATINE) 10 MG tablet Take 1 tablet (10 mg total) by mouth 2 (two) times daily with a meal. 09/07/21   Domenic Polite, MD  nitroGLYCERIN (NITROSTAT) 0.4 MG SL tablet Place 1 tablet (0.4 mg total) under the tongue every 5 (five) minutes as needed for chest pain. 01/14/18 01/06/22  Erlene Quan, PA-C  ondansetron  (ZOFRAN) 4 MG tablet Take 4 mg by mouth 3 (three) times daily as needed for nausea/vomiting. 11/14/21  [provider]  PROAIR RESPICLICK 808 (90 Base) MCG/ACT AEPB Inhale 1 puff into the lungs every 4 (four) hours as needed (shortness of breath). 08/02/21   [provider]  senna (SENOKOT) 8.6 MG TABS tablet Take 2 tablets by mouth daily as needed for mild constipation. 12/05/21   [provider]  Torsemide 40 MG TABS Take 40 mg by mouth daily. 09/08/21   Domenic Polite, MD     Critical care time: 40 minutes    CRITICAL CARE Performed by: Otilio Carpen Jodene Polyak   Total critical care time: 40 minutes  Critical care time was exclusive of separately billable procedures and treating other patients.  Critical care was necessary to treat or prevent imminent or life-threatening deterioration.  Critical care was time spent personally by me on the following activities: development of treatment plan with patient and/or surrogate as well as nursing, discussions with consultants, evaluation of patient's response to treatment, examination of patient, obtaining history from patient or surrogate, ordering and performing treatments and interventions, ordering and review of laboratory studies, ordering and review of radiographic studies, pulse oximetry and re-evaluation of patient's condition.  Otilio Carpen Lashonda Sonneborn, PA-C [facility] See Amion for pager If no response to pager , please call [phone] until 7pm After 7:00 pm call Elink  [phone]?[place]

## 2022-04-11 NOTE — Progress Notes (Signed)
SLP Cancellation Note  Patient Details Name: Anthony Foster MRN: [id number] DOB: 05/31/1941   Cancelled treatment:       Reason Eval/Treat Not Completed: Medical issues which prohibited therapy  Consulted with RN and MD. Chart review reveals pt is now comfort care. MD indicated BSE is no longer needed. Will discontinue orders per Dr. Tacy Learn. Please reconsult if needs arise.   Anthony Foster B. Anthony Foster, [facility], [place] Speech Language Pathologist Office: [phone]  Anthony Foster 04/19/2022, 9:37 AM

## 2022-04-11 NOTE — Progress Notes (Signed)
Patient arrived to [facility] room 30 alert. Bed in lowest position, call light in reach. Will continue to monitor pt.

## 2022-04-11 NOTE — ED Notes (Signed)
Patient transported to CT 

## 2022-04-11 NOTE — Progress Notes (Signed)
   Palliative Medicine Inpatient Follow Up Note   Palliative care has acknowledged the consultation for Anthony Foster. Presently Anthony Foster is enrolled in [facility]  Have coordinated with hospice liaison, Lysle Pearl who will follow up on care.  Dr. Tacy Learn of [facility] has converted patient to comfort measures and confirms no need for inpatient Palliative involvement at this time.  [facility] will resume symptom management and try to transition patient to their inpatient facility.   We will sign off.  No Charge ______________________________________________________________________________________ [place] Team Team Cell Phone: [phone] Please utilize secure chat with additional questions, if there is no response within 30 minutes please call the above phone number  Palliative Medicine Team providers are available by phone from 7am to 7pm daily and can be reached through the team cell phone.  Should this patient require assistance outside of these hours, please call the patient's attending physician.

## 2022-04-11 NOTE — Progress Notes (Addendum)
81 year old male with CKD stage V, who has refused this is, chronic atrial fibrillation on Eliquis, chronic HFrEF s/p AICD who has been in hospice since July 2023 was brought into the emergency department due to worsening mental status and episode of seizure  He was noted to be hyperkalemic and uremic associated with hypotension with MAP in 50s, patient was admitted to ICU for further care  Physical exam: General: Chronically ill-appearing male, lying on the bed HEENT: Tool/AT, eyes anicteric.  moist mucus membranes Neuro: Awake, confused, intermittently following commands Chest: Coarse breath sounds, no wheezes or rhonchi Heart: Irregularly irregular, no murmurs or gallops Abdomen: Soft, nontender, nondistended, bowel sounds present Skin: No rash   Assessment and plan: AKI on CKD stage V Hyperkalemia/hypocalcemia Hyponatremia Metabolic acidosis Acute blood loss anemia Acute uremic encephalopathy History of seizure disorder now with 1 more seizure Hypotension in the setting of vasoplegia caused by metabolic acidosis Chronic HFrEF is post AICD Chronic atrial fibrillation on Eliquis Severe protein calorie malnutrition Demand cardiac ischemia Acute GI bleeding Acute blood loss anemia  Patient was noted to be hypotensive with map in 50s after arriving to ICU Serum potassium remained elevated to 6.5 Patient stated he would not want dialysis  Due to hypotension, I spoke with patient and his next of kin grandson, both are in agreement that he would not want aggressive measures including IV vasopressor support They would like to proceed with comfort care Comfort care orders were written Labs were discontinued Possible orders written for patient to be transferred to palliative floor   Total critical care time: 35 minutes  Performed by: Jacky Kindle   Critical care time was exclusive of separately billable procedures and treating other patients.   Critical care was necessary to  treat or prevent imminent or life-threatening deterioration.   Critical care was time spent personally by me on the following activities: development of treatment plan with patient and/or surrogate as well as nursing, discussions with consultants, evaluation of patient's response to treatment, examination of patient, obtaining history from patient or surrogate, ordering and performing treatments and interventions, ordering and review of laboratory studies, ordering and review of radiographic studies, pulse oximetry and re-evaluation of patient's condition.   Jacky Kindle, MD [facility] See Amion for pager If no response to pager, please call [phone] until 7pm After 7pm, Please call E-link [phone]

## 2022-04-11 NOTE — Progress Notes (Signed)
Nutrition Brief Note  Chart reviewed. Pt now transitioning to comfort care.  No nutrition interventions planned at this time.  Please re-consult as needed.    Gustavus Bryant, MS, RD, LDN Inpatient Clinical Dietitian Please see AMiON for contact information.

## 2022-04-11 NOTE — TOC Initial Note (Signed)
Transition of Care [facility]) - Initial/Assessment Note    Patient Details  Name: Anthony Foster MRN: [id number] Date of Birth: 11-12-1940  Transition of Care [facility]) CM/SW Contact:    Verdell Carmine, RN Phone Number: 04/16/2022, 11:41 AM  Clinical Narrative:                  Transitions of Care (TOC) has reviewed the chart  And found no needs at this time The patient will be reviewed in daily progression rounds for needs. The patient is currently on comfort measures. If a care management console is needed please place a TOC consult.    Patient Goals and CMS Choice        Expected Discharge Plan and Services                                                Prior Living Arrangements/Services                       Activities of Daily Living      Permission Sought/Granted                  Emotional Assessment              Admission diagnosis:  Hyperkalemia [E87.5] Acute GI bleeding [K92.2] ESRD (end stage renal disease) ([place]) [N18.6] Patient Active Problem List   Diagnosis Date Noted   Pressure injury of skin 03/24/2022   Acute GI bleeding    Uremia    Idiopathic hypotension    Hyperkalemia 01/06/2022   Nausea & vomiting 01/06/2022   Incidental finding of COVID-19 virus infection 94/17/4081   Chronic systolic CHF (congestive heart failure) ([place]) 01/06/2022   AKI (acute kidney injury) ([place]) 01/06/2022   Acute on chronic combined systolic and diastolic CHF (congestive heart failure) ([place]) 08/24/2021   Thrombocytopenia ([place]) 08/24/2021   AAA (abdominal aortic aneurysm) ([place]) 08/24/2021   Tobacco use 08/24/2021   Chest pain with moderate risk of acute coronary syndrome 01/14/2018   Chronic anticoagulation 01/14/2018   Acute renal failure superimposed on stage 5 chronic kidney disease, not on chronic dialysis ([place]) 01/14/2018   Ischemic cardiomyopathy 01/14/2018   CAD S/P percutaneous coronary angioplasty 09/12/2015   Renal  insufficiency 09/12/2015   Essential hypertension 09/12/2015   Hyperlipidemia 09/12/2015   CHF (congestive heart failure) ([place]) 07/07/2015   Right knee pain 07/07/2015   Pedal edema 07/07/2015   Fluid overload 07/07/2015   PAF (paroxysmal atrial fibrillation) ([place]) on chronic anticoagulation 07/07/2015   Gout 07/07/2015   PCP:  Wenda Low, MD Pharmacy:   [facility], [place] [address] [place] [address] Phone: [phone] Fax: [phone]  [facility] [phone] - [place], [place] - [address] [place] [address] Phone: [phone] Fax: [phone]  [facility] [address] [place] [address] Phone: [phone] Fax: [phone]     Social Determinants of Health (SDOH) Interventions    Readmission Risk Interventions    01/07/2022   11:19 AM  Readmission Risk Prevention Plan  Transportation Screening Complete  PCP or Specialist Appt within 3-5 Days Complete  HRI or Home Care Consult Complete  Social Work Consult  for Recovery Care Planning/Counseling Complete  Palliative Care Screening Complete  Medication Review (RN Care Manager) Referral to Pharmacy

## 2022-04-11 NOTE — Plan of Care (Signed)
  Interdisciplinary Goals of Care Family Meeting   Date carried out:: 04/03/2022  Location of the meeting: Bedside  Member's involved: Family Member or next of kin and Other: Physician Assistant  Durable Power of Attorney or acting medical decision maker:   [name]  Discussion: We discussed goals of care for Anthony Foster .  Pt was discharged home on hospice after refusing and not tolerating dialysis last admission.  He has cared for by his Grandson at home.  His mental and overall status have been declining and he is back in the ER with hyperkalemia, uremia, GIB.  He initially mentioned wanting full code and aggressive care with consideration of dialysis, however now states he does not want this dialysis.  Grandson wants to defer decisions to the patient as long as possible and is not ready to transition to comfort care.  We discussed that patient is already intermittently confused and when he cannot make decisions for himself that falls to the [facility].  I was able to discuss code status and patient agreed to DNR.  I stated that his kidneys are failing and he will eventually pass away.  They would like to continue supportive care and medical management at this time  Code status: DNR  Disposition: Continue current acute care   Time spent for the meeting: 20 minutes  Otilio Carpen Sayre Mazor 04/12/2022, 5:54 AM

## 2022-04-11 NOTE — ED Triage Notes (Signed)
Per EMS they were called out due to AMS. When they arrived, pt was unresponsive, slumped over in a chair. EMS reports pt has a pacemaker and it is only capturing half the beats. EMS also reports that pt is on Eliquis and there was a lot of blood in the commode.

## 2022-04-11 NOTE — ED Provider Notes (Signed)
[facility] EMERGENCY DEPARTMENT Provider Note   CSN: [id number] Arrival date & time: 03/25/2022  0015     History  Chief Complaint  Patient presents with   Altered Mental Status    Anthony Foster is a 81 y.o. male.  The history is provided by the EMS personnel, the patient, a relative and medical records.  Altered Mental Status Anthony Foster is a 81 y.o. male who presents to the Emergency Department complaining of Unresponsive.  Level 5 caveat due to altered mental status.  History is provided by EMS.  EMS was called out due to unresponsive episode, patient collapsed on the commode.  On EMS arrival he was unresponsive, blood pressures in the 80s.  They did note a large amount of blood in the commode.  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  He was treated with IV fluid bolus with improvement in his blood pressure as well as improvement in his mental status.  Patient states that he has back pain, unable to provide additional review of systems.  Additional history available from patient's grandson after patient's initial ED assessment.  Patient grandson states that he was in his routine state of health when he went up to use the bathroom and became unresponsive.  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  He is currently on hospice therapy but requests full CODE STATUS.     Home Medications Prior to Admission medications   Medication Sig Start Date End Date Taking? Authorizing Provider  allopurinol (ZYLOPRIM) 300 MG tablet Take 300 mg by mouth daily. 07/26/16   [provider]  amiodarone (PACERONE) 200 MG tablet Take 1 tablet (200 mg total) by mouth daily. 01/07/22   Amin, Jeanella Flattery, MD  apixaban (ELIQUIS) 2.5 MG TABS tablet Take 1 tablet (2.5 mg total) by mouth daily. 06/10/19   Lelon Perla, MD  atorvastatin (LIPITOR) 80 MG tablet Take 1 tablet (80 mg total) by mouth daily. Patient not taking:  Reported on 01/06/2022 09/12/15   Lelon Perla, MD  docusate sodium (COLACE) 100 MG capsule Take 100 mg by mouth daily as needed for mild constipation. 12/05/21   [provider]  EPINEPHrine 0.3 mg/0.3 mL IJ SOAJ injection SMARTSIG:1 pre-filled pen syringe IM Once 11/14/21   [provider]  midodrine (PROAMATINE) 10 MG tablet Take 1 tablet (10 mg total) by mouth 2 (two) times daily with a meal. 09/07/21   Domenic Polite, MD  nitroGLYCERIN (NITROSTAT) 0.4 MG SL tablet Place 1 tablet (0.4 mg total) under the tongue every 5 (five) minutes as needed for chest pain. 01/14/18 01/06/22  Erlene Quan, PA-C  ondansetron (ZOFRAN) 4 MG tablet Take 4 mg by mouth 3 (three) times daily as needed for nausea/vomiting. 11/14/21   [provider]  PROAIR RESPICLICK 850 (90 Base) MCG/ACT AEPB Inhale 1 puff into the lungs every 4 (four) hours as needed (shortness of breath). 08/02/21   [provider]  senna (SENOKOT) 8.6 MG TABS tablet Take 2 tablets by mouth daily as needed for mild constipation. 12/05/21   [provider]  Torsemide 40 MG TABS Take 40 mg by mouth daily. 09/08/21   Domenic Polite, MD      Allergies    Hymenoptera venom preparations, Lisinopril, Piroxicam, Shellfish allergy, and Amoxicillin-pot clavulanate    Review of Systems   Review of Systems  Unable to perform ROS: Mental status change    Physical  Exam Updated Vital Signs BP (!) 91/27   Pulse (!) 51   Temp 97.6 F (36.4 C) (Oral)   Resp 17   Ht '5\' 8"'$  (1.727 m)   Wt 61.1 kg   SpO2 100%   BMI 20.48 kg/m  Physical Exam Vitals and nursing note reviewed.  Constitutional:      Appearance: He is well-developed.     Comments: lethargic  HENT:     Head: Normocephalic and atraumatic.  Cardiovascular:     Rate and Rhythm: Regular rhythm. Bradycardia present.     Heart sounds: No murmur heard. Pulmonary:     Effort: Pulmonary effort is normal. No respiratory distress.     Breath sounds:  Normal breath sounds.  Abdominal:     Palpations: Abdomen is soft.     Tenderness: There is no abdominal tenderness. There is no guarding or rebound.  Genitourinary:    Comments: Dark blood on DRE Musculoskeletal:        General: No swelling or tenderness.     Comments: 2+ femoral pulses bilaterally  Skin:    General: Skin is warm and dry.     Coloration: Skin is pale.  Neurological:     Comments: MAE symmetrically but weakly  Psychiatric:        Behavior: Behavior normal.     ED Results / Procedures / Treatments   Labs (all labs ordered are listed, but only abnormal results are displayed) Labs Reviewed  CBC WITH DIFFERENTIAL/PLATELET - Abnormal; Notable for the following components:      Result Value   WBC 12.3 (*)    RBC 2.58 (*)    Hemoglobin 7.6 (*)    HCT 20.5 (*)    MCV 79.5 (*)    MCHC 37.1 (*)    Platelets 135 (*)    Neutro Abs 10.9 (*)    Lymphs Abs 0.6 (*)    Abs Immature Granulocytes 0.12 (*)    All other components within normal limits  COMPREHENSIVE METABOLIC PANEL - Abnormal; Notable for the following components:   Sodium 132 (*)    Potassium 6.0 (*)    Chloride 93 (*)    CO2 12 (*)    Glucose, Bld 153 (*)    BUN 265 (*)    Creatinine, Ser 20.07 (*)    Total Protein 5.6 (*)    Albumin 2.9 (*)    AST 63 (*)    ALT 95 (*)    GFR, Estimated 2 (*)    Anion gap 27 (*)    All other components within normal limits  BRAIN NATRIURETIC PEPTIDE - Abnormal; Notable for the following components:   B Natriuretic Peptide 557.5 (*)    All other components within normal limits  LACTIC ACID, PLASMA - Abnormal; Notable for the following components:   Lactic Acid, Venous 2.5 (*)    All other components within normal limits  LACTIC ACID, PLASMA - Abnormal; Notable for the following components:   Lactic Acid, Venous 2.0 (*)    All other components within normal limits  PHENYTOIN LEVEL, TOTAL - Abnormal; Notable for the following components:   Phenytoin Lvl 6.3 (*)     All other components within normal limits  CBG MONITORING, ED - Abnormal; Notable for the following components:   Glucose-Capillary 141 (*)    All other components within normal limits  I-STAT VENOUS BLOOD GAS, ED - Abnormal; Notable for the following components:   pCO2, Ven 23.4 (*)    pO2, Ven  188 (*)    Bicarbonate 11.4 (*)    TCO2 12 (*)    Acid-base deficit 14.0 (*)    Sodium 125 (*)    Potassium 6.5 (*)    Calcium, Ion 0.87 (*)    HCT 26.0 (*)    Hemoglobin 8.8 (*)    All other components within normal limits  TROPONIN I (HIGH SENSITIVITY) - Abnormal; Notable for the following components:   Troponin I (High Sensitivity) 106 (*)    All other components within normal limits  TROPONIN I (HIGH SENSITIVITY) - Abnormal; Notable for the following components:   Troponin I (High Sensitivity) 91 (*)    All other components within normal limits  CBC  BASIC METABOLIC PANEL  HEMOGLOBIN A1C  I-STAT CHEM 8, ED  TYPE AND SCREEN  ABO/RH  PREPARE RBC (CROSSMATCH)    EKG EKG Interpretation  Date/Time:  Thursday April 11 2022 00:20:25 EDT Ventricular Rate:  129 PR Interval:  396 QRS Duration: 143 QT Interval:  346 QTC Calculation: 397 R Axis:   58 Text Interpretation: ATRIAL PACED RHYTHM Confirmed by Quintella Reichert [phone]) on 04/03/2022 1:48:02 AM  Radiology CT CHEST ABDOMEN PELVIS WO CONTRAST  Result Date: 04/15/2022 CLINICAL DATA:  Altered mental status; patient found unresponsive with blood in the commode EXAM: CT CHEST, ABDOMEN AND PELVIS WITHOUT CONTRAST TECHNIQUE: Multidetector CT imaging of the chest, abdomen and pelvis was performed following the standard protocol without IV contrast. RADIATION DOSE REDUCTION: This exam was performed according to the departmental dose-optimization program which includes automated exposure control, adjustment of the mA and/or kV according to patient size and/or use of iterative reconstruction technique. COMPARISON:  None Available.  FINDINGS: CT CHEST FINDINGS Cardiovascular: Coronary artery and aortic atherosclerosis. Left chest wall pacemaker. Normal heart size. No pericardial effusion. Mediastinum/Nodes: Unremarkable thyroid. No thoracic adenopathy by size. Unremarkable esophagus. Lungs/Pleura: Emphysema. Respiratory motion obscures the lung bases. No focal consolidation, pleural effusion, or pneumothorax. Small amount of secretions in the lower trachea. Musculoskeletal: No chest wall mass or suspicious bone lesions identified. CT ABDOMEN PELVIS FINDINGS Hepatobiliary: Respiratory motion obscures the upper abdomen. No definite liver abnormality. Distended gallbladder. No definite gallstones or biliary dilation. Pancreas: No acute abnormality. Spleen: Unremarkable. Adrenals/Urinary Tract: Large simple right renal cysts not requiring follow-up. Smaller left renal cyst is not require follow-up. Cortical scarring. Punctate nonobstructing stone in the lower pole of the left kidney. No hydronephrosis. Nondistended bladder. Stable thickening of the left adrenal gland. Stomach/Bowel: Unremarkable stomach. Normal caliber large and small bowel. Irregular wall thickening in the sigmoid colon may be due to underdistention however malignancy is not excluded. There are few sigmoid diverticula without diverticulitis. The appendix is not definitively visualized. Vascular/Lymphatic: Advanced aortic atherosclerosis. Infrarenal abdominal aortic aneurysm measuring 3.8 cm in diameter, unchanged using similar measuring technique. 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 No evidence leak or rupture on noncontrast exam. No suspicious lymphadenopathy. Reproductive: Enlarged prostate. Other: No free intraperitoneal fluid or air. Musculoskeletal: No acute or significant osseous findings. IMPRESSION: Wall thickening in the sigmoid colon without adjacent inflammatory change  is indeterminate and malignancy is not excluded. Recommend correlation with colonoscopy. Infrarenal abdominal aortic aneurysm measuring 3.8 cm. 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 These are not significantly changed from 01/05/2022. Coronary artery atherosclerosis. Aortic Atherosclerosis (ICD10-I70.0) and Emphysema (ICD10-J43.9). Enlarged prostate. Electronically Signed  By: Placido Sou M.D.   On: 04/15/2022 02:02   CT Head Wo Contrast  Result Date: 04/16/2022 CLINICAL DATA:  Mental status change; found unresponsive EXAM: CT HEAD WITHOUT CONTRAST TECHNIQUE: Contiguous axial images were obtained from the base of the skull through the vertex without intravenous contrast. RADIATION DOSE REDUCTION: This exam was performed according to the departmental dose-optimization program which includes automated exposure control, adjustment of the mA and/or kV according to patient size and/or use of iterative reconstruction technique. COMPARISON:  CT head 04/05/2022 FINDINGS: Brain: No intracranial hemorrhage, mass effect, or evidence of acute infarct. No hydrocephalus. No extra-axial fluid collection. Generalized cerebral atrophy. Ill-defined hypoattenuation within the cerebral white matter is nonspecific but consistent with chronic small vessel ischemic disease. Vascular: No hyperdense vessel. Intracranial arterial calcification. Skull: No fracture or focal lesion. Sinuses/Orbits: No acute finding. Paranasal sinuses and mastoid air cells are well aerated. Other: None. IMPRESSION: No acute intracranial abnormality. Electronically Signed   By: Placido Sou M.D.   On: 03/25/2022 01:48   DG Chest Portable 1 View  Result Date: 04/04/2022 CLINICAL DATA:  Pain EXAM: PORTABLE CHEST 1 VIEW COMPARISON:  04/05/2022 FINDINGS: Left AICD is unchanged. Heart and mediastinal contours are within normal limits. No focal opacities or effusions. No acute bony  abnormality. Aortic atherosclerosis. IMPRESSION: No active cardiopulmonary disease. Electronically Signed   By: Rolm Baptise M.D.   On: 04/10/2022 00:45    Procedures Procedures   CRITICAL CARE Performed by: Quintella Reichert   Total critical care time: 45 minutes  Critical care time was exclusive of separately billable procedures and treating other patients.  Critical care was necessary to treat or prevent imminent or life-threatening deterioration.  Critical care was time spent personally by me on the following activities: development of treatment plan with patient and/or surrogate as well as nursing, discussions with consultants, evaluation of patient's response to treatment, examination of patient, obtaining history from patient or surrogate, ordering and performing treatments and interventions, ordering and review of laboratory studies, ordering and review of radiographic studies, pulse oximetry and re-evaluation of patient's condition.  Medications Ordered in ED Medications  sodium bicarbonate 150 mEq in sterile water 1,150 mL infusion (has no administration in time range)  albuterol (PROVENTIL) (2.5 MG/3ML) 0.083% nebulizer solution 3 mL (has no administration in time range)  midodrine (PROAMATINE) tablet 10 mg (10 mg Oral Given 04/14/2022 0447)  senna (SENOKOT) tablet 17.2 mg (has no administration in time range)  ondansetron (ZOFRAN) tablet 4 mg (has no administration in time range)  docusate sodium (COLACE) capsule 100 mg (has no administration in time range)  amiodarone (PACERONE) tablet 200 mg (has no administration in time range)  polyethylene glycol (MIRALAX / GLYCOLAX) packet 17 g (has no administration in time range)  phenytoin (DILANTIN) chewable tablet 50 mg (has no administration in time range)  LORazepam (ATIVAN) injection 2 mg (has no administration in time range)  insulin aspart (novoLOG) injection 0-6 Units (has no administration in time range)  insulin aspart  (novoLOG) injection 0-5 Units (has no administration in time range)  fentaNYL (SUBLIMAZE) injection 12.5 mcg (has no administration in time range)  insulin aspart (novoLOG) injection 10 Units (has no administration in time range)  dextrose 50 % solution 50 mL (has no administration in time range)  0.9 %  sodium chloride infusion (0 mL/hr Intravenous Stopped 03/28/2022 0435)  calcium gluconate 1 g/ 50 mL sodium chloride IVPB (0 mg Intravenous Stopped 04/03/2022 0436)  albuterol (PROVENTIL) (2.5 MG/3ML) 0.083% nebulizer solution  10 mg (10 mg Nebulization Given 03/25/2022 0156)  sodium bicarbonate injection 50 mEq (50 mEq Intravenous Given 04/21/2022 0214)  sodium zirconium cyclosilicate (LOKELMA) packet 10 g (10 g Oral Given 04/07/2022 0448)    ED Course/ Medical Decision Making/ A&P                           Medical Decision Making Amount and/or Complexity of Data Reviewed Labs: ordered. Radiology: ordered.  Risk Prescription drug management. Decision regarding hospitalization.   Patient with ESRD not on dialysis, CHF on midodrine, atrial fibrillation on Eliquis here for evaluation following unresponsive episode with large amount of blood in the  commode.  Patient hypotensive for EMS, blood pressure did improve with IV fluid administration prior to ED arrival.  He did develop recurrent hypotension.  DRE with dark blood in the rectal vault.  Labs significant for acute anemia, concern for acute GI bleed and plan to transfuse PRBC for blood pressure support.  Bedside ultrasound with abdominal aortic aneurysm, no free fluid.  CT chest abdomen pelvis obtained to rule out rupture.  After improvement in his blood pressure patient's mental status and perfusion did improve.  Initially patient requesting full CODE STATUS but states that he cannot tolerate dialysis.  Patient's condition and severe illness discussed with both patient and the grandson at the bedside.  Discussed with critical care-plan to admit for  ongoing treatment.  Discussed with nephrology-states the patient is not hemodialysis candidate given his severe comorbidities.        Final Clinical Impression(s) / ED Diagnoses Final diagnoses:  Acute GI bleeding  Hyperkalemia    Rx / DC Orders ED Discharge Orders     None         Quintella Reichert, MD 03/26/2022 [phone]

## 2022-04-11 NOTE — Progress Notes (Signed)
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 He was found by his grandson to be unresponsive in the bathroom, EMS was activated and he was sent to the ED for further evaluation. Hospice was notified after he was sent to the ED. He is admitted with acute on chronic renal failure, acute blood loss anemia secondary to GIB. This is a related hospital admission per Dr. Karie Georges, [facility] MD.  Anthony Foster report, Anthony Foster agreed to be a full DNR/DNI and proceed with comfort measures only. Spoke with grandson Anthony Foster about plans to discharge and if they wanted to bring him home for EOL care or would like to consider [facility]. Anthony Foster needs to speak with his aunts and uncles today about what the next best steps are regarding discharge.  V/S: 98.5 oral, 97/40, HR 50, RR 12, SPO2 100% on RA I&O: not recorded Labs: Na 132, K 6.0, BUN 265, Cr 20.07, AST 63, ALT 95, GFR 2, LA 2.5, Troponin 106, BNP 557.5, RBC 2.58, hgb 7.6, HCT 20.5 Diagnostics: CT head negative. CXR negative.  IV/PRN: sodium bicarbonate 50 mEq IV x 1, lokelma 10g IV x 1, calcium gluconate 1 g IV x 1, sodium bicarbonate infusion continuous @ 75 ml/hr (now stopped), fentanyl 12.5 mcg IV x 1  Anthony Foster is inpatient appropriate for treatment of of ESRD requiring IV fluids.  Problem List: - AKI on CKD - not a candidate for HD. Pt discussed with attending and decided to change to DNR status.  - hyperkalemia/hypocalcemia - lokelma and calcium gluconate administered, now shifted to full comfort measures - acute blood loss anemia - supportive measures only  D/C planning: Ongoing. Anthony Foster, grandson and caregiver, will discuss with his aunts and uncles about d/c plans. GOC: now clear, full comfort measures in place Family: not present at the bedside, but updated Anthony Foster  over the phone IDT: hospice IDT updated  Transfer summary and med list to shadow chart.  If EMS transport is needed at d/c, please use [facility] as they provide this service for our active hospice patients.  Anthony Carbon DNP, RN [facility] Liaison [facility] under hospice, [phone])

## 2022-04-11 NOTE — Progress Notes (Signed)
Pt arrived to 45M 02 with 4 bracelets on his right wrist. Pt refused to remove them and would like for them to stay on his wrist.

## 2022-04-12 DIAGNOSIS — E875 Hyperkalemia: Secondary | ICD-10-CM | POA: Diagnosis not present

## 2022-04-12 LAB — BLOOD PRODUCT ORDER (VERBAL) VERIFICATION

## 2022-04-12 NOTE — Progress Notes (Signed)
[place] 6N30 [facility]) Hospitalized Hospice Patient   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 He was found by his grandson to be unresponsive in the bathroom, EMS was activated and he was sent to the ED for further evaluation. Hospice was notified after he was sent to the ED. He is admitted with acute on chronic renal failure, acute blood loss anemia secondary to GIB. This is a related hospital admission per Dr. Karie Georges, [facility] MD.  Visited patient at bedside and exchanged report with bedside RN who stated that patient had been yelling out and very restless requiring IV PRN medications. Currently patient is only responsive to pain stimuli. Spoke with grandson who also stated this was how patient was becoming at home before admission to the hospital. [name] is contact and would like to discharge patient to [facility] tomorrow.   VS: 91/38, 50HR, 17RR, 100% RA I&O:? Labs:none  Diagnostics: no new since 10/19  IV/PRN:aloperidol lactate (HALDOL) injection 0.5 mg Dose: 0.5 mg Freq: Every 4 hours PRN Route: IV-0633  LORazepam (ATIVAN) injection 1 mg Dose: 1 mg Freq: Every 4 hours PRN Route: IV-0948  Anthony Foster is inpatient appropriate for treatment of ESRD and increased agitation and restlessness.   Problem List: - AKI on CKD - not a candidate for HD. Pt discussed with attending and decided to change to DNR status.  - hyperkalemia/hypocalcemia - lokelma and calcium gluconate administered, now shifted to full comfort measures - acute blood loss anemia - supportive measures only   D/C planning: Ongoing. [name], grandson and family would like pt to dc to [facility] tomorrow. GOC: DNR, now clear, full comfort measures in place Family: not present at the bedside, but updated [name] over the phone IDT: hospice IDT updated    If EMS transport is needed at d/c, please use [facility] as they  provide this service for our active hospice patients.   Clementeen Hoof, DNP, RN [facility] under hospice, [phone])

## 2022-04-12 NOTE — Progress Notes (Signed)
PROGRESS NOTE  Anthony Foster  DOB: 06-25-1940  PCP: Wenda Low, MD QZE:[id number]  DOA: 04/15/2022  LOS: 1 day  Hospital Day: 2  Brief narrative: Anthony Foster is a 81 y.o. 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 22/63, he was found by his grandson to be unresponsive in the bathroom.  EMS was activated and patient was brought to the ED. Work-up in ED showed hyperkalemia, uremia, hypotension Patient was admitted to ICU for further care. Palliative care and hospice team was involved. They spoke with patient and family. He was made comfort care and transfer out of ICU service  Subjective: Patient was seen and examined this morning.  Propped up in bed.  Shallow and slow breathing.  Tries to open eyes on touch.  No family at bedside.  Seems comfortable  Assessment and plan: Comfort care measures Palliative care consult appreciated.  Hospice following as well. Family is agreeable to residential hospice tomorrow. Continue comfort care measures.  Other acute issues: AKI on CKD -not candidate for HD Hyperkalemia/hypocalcemia  Acute blood loss anemia  Goals of care   Code Status: DNR   Skin assessment:  Pressure Injury 04/05/2022 Coccyx Mid Stage 1 -  Intact skin with non-blanchable redness of a localized area usually over a bony prominence. (Active)  04/10/2022 0900  Location: Coccyx  Location Orientation: Mid  Staging: Stage 1 -  Intact skin with non-blanchable redness of a localized area usually over a bony prominence.  Wound Description (Comments):   Present on Admission: Yes    Nutritional status:  Body mass index is 18.5 kg/m.          Diet:  Diet Order             DIET - DYS 1 Room service appropriate? Yes; Fluid consistency: Thin  Diet effective now                   DVT prophylaxis:  SCDs Start: 04/12/2022 0347   Antimicrobials: None Fluid: None Consultants:  Palliative care Family Communication: Family not at bedside  Status is: Inpatient  Continue in-hospital care because: Planning to transfer to residential hospice tomorrow. Level of care: Palliative Care   Dispo: The patient is from: Home hospice              Anticipated d/c is to: Residential hospice tomorrow         Infusions:    Scheduled Meds:  mouth rinse  15 mL Mouth Rinse 4 times per day    PRN meds: acetaminophen **OR** acetaminophen, antiseptic oral rinse, docusate sodium, fentaNYL (SUBLIMAZE) injection, glycopyrrolate **OR** glycopyrrolate **OR** glycopyrrolate, haloperidol **OR** haloperidol **OR** haloperidol lactate, LORazepam **OR** LORazepam **OR** LORazepam, LORazepam, morphine CONCENTRATE **OR** morphine CONCENTRATE, ondansetron **OR** ondansetron (ZOFRAN) IV, mouth rinse, polyethylene glycol, polyvinyl alcohol, senna   Antimicrobials: Anti-infectives (From admission, onward)    None       Objective: Vitals:   04/21/2022 1622 04/12/22 0527  BP: (!) 92/37 (!) 91/38  Pulse:  (!) 50  Resp: 17 17  Temp:    SpO2:  100%    Intake/Output Summary (Last 24 hours) at 04/12/2022 1525 Last data filed at 04/12/2022 1000 Gross per 24 hour  Intake 100 ml  Output --  Net 100 ml   Filed Weights   04/21/2022 0021 04/19/2022 0820  Weight: 61.1 kg 55.2 kg   Weight change: -5.9 kg Body mass index is 18.5 kg/m.  Physical Exam: General exam: Propped up comfortably in bed. Did not do a detailed exam because of Comfort Care status.  Data Review: I have personally reviewed the laboratory data and studies available.  F/u labs  Unresulted Labs (From admission, onward)    None       Signed, Terrilee Croak, MD [facility] 04/12/2022

## 2022-04-13 DIAGNOSIS — Z515 Encounter for palliative care: Secondary | ICD-10-CM

## 2022-04-13 DIAGNOSIS — E875 Hyperkalemia: Secondary | ICD-10-CM | POA: Diagnosis not present

## 2022-04-13 MED ORDER — ONDANSETRON HCL 4 MG/2ML IJ SOLN: 4.0000 mg | Freq: Four times a day (QID) | INTRAMUSCULAR | 0 refills | Status: AC | PRN

## 2022-04-13 MED ORDER — ONDANSETRON 4 MG PO TBDP: 4.0000 mg | ORAL_TABLET | Freq: Four times a day (QID) | ORAL | 0 refills | Status: AC | PRN

## 2022-04-13 MED ORDER — POLYVINYL ALCOHOL 1.4 % OP SOLN: 1.0000 [drp] | Freq: Four times a day (QID) | OPHTHALMIC | 0 refills | Status: AC | PRN

## 2022-04-13 MED ORDER — FENTANYL CITRATE PF 50 MCG/ML IJ SOSY: 12.5000 ug | PREFILLED_SYRINGE | INTRAMUSCULAR | 0 refills | Status: AC | PRN

## 2022-04-13 MED ORDER — MORPHINE SULFATE (CONCENTRATE) 10 MG/0.5ML PO SOLN: 5.0000 mg | ORAL | 0 refills | Status: AC | PRN

## 2022-04-13 MED ORDER — ORAL CARE MOUTH RINSE: 15.0000 mL | OROMUCOSAL | 0 refills | Status: AC | PRN

## 2022-04-13 MED ORDER — POLYETHYLENE GLYCOL 3350 17 G PO PACK: 17.0000 g | PACK | Freq: Every day | ORAL | 0 refills | Status: AC | PRN

## 2022-04-13 MED ORDER — LORAZEPAM 1 MG PO TABS: 1.0000 mg | ORAL_TABLET | ORAL | 0 refills | Status: AC | PRN

## 2022-04-13 MED ORDER — LORAZEPAM 2 MG/ML IJ SOLN: 2.0000 mg | INTRAMUSCULAR | 0 refills | Status: AC | PRN

## 2022-04-13 MED ORDER — HALOPERIDOL 0.5 MG PO TABS: 0.5000 mg | ORAL_TABLET | ORAL | Status: AC | PRN

## 2022-04-13 MED ORDER — GLYCOPYRROLATE 1 MG PO TABS: 1.0000 mg | ORAL_TABLET | ORAL | Status: AC | PRN

## 2022-04-14 LAB — TYPE AND SCREEN
ABO/RH(D): O POS
Antibody Screen: NEGATIVE
Unit division: 0
Unit division: 0
Unit division: 0

## 2022-04-14 LAB — BPAM RBC
Blood Product Expiration Date: 202311132359
Blood Product Expiration Date: 202311172359
Blood Product Expiration Date: 202311182359
ISSUE DATE / TIME: 202310181610
ISSUE DATE / TIME: 202310190110
Unit Type and Rh: 5100
Unit Type and Rh: 5100
Unit Type and Rh: 5100

## 2022-04-14 NOTE — Progress Notes (Signed)
Patient expired at 2300, 2 nurses verified. [name] was notified and came to see patient and took all belongings. Post mortem checklist is completed. Patient will be transported to morgue.

## 2022-04-24 NOTE — Progress Notes (Signed)
Manufacturing engineer [facility])  Anthony Foster is our current hospice patient that is needing Optometrist for EOL care.  Plan is to transfer him today, once necessary consents are completed.  ACC will update hospital staff once EMS can be called.  RN Staff, please call report to [phone].  Please notify [name], number on facesheet, when EMS arrives so he has an ETA on when his grandfather will leave.  Thank you, Venia Carbon DNP, RN [facility] Liaison

## 2022-04-24 NOTE — Discharge Summary (Signed)
Physician Discharge Summary  Anthony Foster RKY:[id number] DOB: Sep 09, 1940 DOA: 04/20/2022  PCP: Wenda Low, MD  Admit date: 04/09/2022 Discharge date: 2022/04/28  Admitted From: Home hospice Discharge disposition: Home hospice  Recommendations at discharge:  Per hospice policy    Brief narrative: Anthony Foster is a 81 y.o. 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 31/51, he was found by his grandson to be unresponsive in the bathroom.  EMS was activated and patient was brought to the ED. Work-up in ED showed hyperkalemia, uremia, hypotension Patient was admitted to ICU for further care. Palliative care and hospice team was involved. They spoke with patient and family. He was made comfort care and transfer out of ICU service  Subjective: Patient was seen and examined this morning.  Propped up in bed.  Shallow and slow breathing.  Tries to open eyes on touch.  No family at bedside.  Seems comfortable  Past course: Comfort care measures Palliative care consult appreciated.  Hospice following as well. Family is agreeable to residential hospice today Continue comfort care measures.  Other acute issues: AKI on CKD -not candidate for HD Hyperkalemia/hypocalcemia  Acute blood loss anemia  Wounds:  - Incision (Closed) 11/26/18 Arm Right (Active)  Date First Assessed/Time First Assessed: 11/26/18 1142   Location: Arm  Location Orientation: Right    Assessments 11/26/2018 12:30 PM 11/26/2018  1:30 PM  Dressing Type Liquid skin adhesive Liquid skin adhesive  Dressing Clean;Dry;Intact Clean;Dry;Intact  Site / Wound Assessment Clean;Dry Clean;Dry  Margins Attached edges (approximated) Attached edges (approximated)  Closure Skin glue Skin glue  Drainage Amount None None     No associated orders.     Pressure Injury 04/02/2022 Coccyx Mid Stage 1 -  Intact skin with non-blanchable redness of a  localized area usually over a bony prominence. (Active)  Date First Assessed/Time First Assessed: 04/12/2022 0900   Location: Coccyx  Location Orientation: Mid  Staging: Stage 1 -  Intact skin with non-blanchable redness of a localized area usually over a bony prominence.  Present on Admission: Yes    Assessments 04/22/2022  9:00 AM 04/12/2022  9:00 PM  Dressing Type Foam - Lift dressing to assess site every shift Foam - Lift dressing to assess site every shift  Dressing Clean, Dry, Intact Clean, Dry, Intact  Dressing Change Frequency PRN --  Site / Wound Assessment Dry;Brown;Pink --  Peri-wound Assessment Erythema (blanchable) --  Drainage Amount None --     No associated orders.     Wound / Incision (Open or Dehisced) 04/08/2022 Skin tear Buttocks Right (Active)  Date First Assessed/Time First Assessed: 04/22/2022 0900   Wound Type: Skin tear  Location: Buttocks  Location Orientation: Right  Present on Admission: Yes    Assessments 04/05/2022  9:00 AM 04/12/2022  9:00 PM  Dressing Type Foam - Lift dressing to assess site every shift Foam - Lift dressing to assess site every shift  Dressing Status -- Clean, Dry, Intact  Site / Wound Assessment Clean;Dry;Pink --  Peri-wound Assessment Intact --  Drainage Amount None --     No associated orders.    Discharge Exam:   Vitals:   04/15/2022 1622 04/12/22 0527 28-Apr-2022 0521 04-28-22 0551  BP: (!) 92/37 (!) 91/38 (!) 90/40   Pulse:  (!) 50 (!) 51   Resp: '17 17 16   '$ Temp:      TempSrc:      SpO2:  100%  95%   Weight:    56.2 kg  Height:        Body mass index is 18.84 kg/m.   General exam: Propped up comfortably in bed. Did not do a detailed exam because of Comfort Care status.  Follow ups:    Follow-up Information     Wenda Low, MD Follow up.   Specialty: Internal Medicine Contact information: [address] [place]  [address] [phone]                 Discharge Instructions:   Discharge  Instructions     Activity as tolerated - No restrictions   Complete by: As directed    Call MD for:   Complete by: As directed    Please get in touch with hospice MD/nurse for any symptom control.   Diet general   Complete by: As directed    If patient is alert and awake enough to eat, can allow luxury feeding.   Discharge instructions   Complete by: As directed    Medicines intended for comfort and pain management prescribed. Rest of the care per hospice policy.   Discharge wound care:   Complete by: As directed        Discharge Medications:   Allergies as of 16-Apr-2022       Reactions   Hymenoptera Venom Preparations Hives   Reaction to wasps   Lisinopril Other (See Comments)   angioedema   Piroxicam Other (See Comments)   burn   Shellfish Allergy Swelling   Amoxicillin-pot Clavulanate Rash   Did it involve swelling of the face/tongue/throat, SOB, or low BP? No Did it involve sudden or severe rash/hives, skin peeling, or any reaction on the inside of your mouth or nose? Yes Did you need to seek medical attention at a hospital or doctor's office? Yes When did it last happen?      over 10 years If all above answers are "NO", may proceed with cephalosporin use.        Medication List     STOP taking these medications    allopurinol 300 MG tablet Commonly known as: ZYLOPRIM   amiodarone 200 MG tablet Commonly known as: PACERONE   apixaban 2.5 MG Tabs tablet Commonly known as: Eliquis   atorvastatin 80 MG tablet Commonly known as: LIPITOR   LORazepam 2 MG tablet Commonly known as: ATIVAN Replaced by: LORazepam 2 MG/ML injection   midodrine 10 MG tablet Commonly known as: PROAMATINE   nitroGLYCERIN 0.4 MG SL tablet Commonly known as: NITROSTAT   ondansetron 4 MG tablet Commonly known as: ZOFRAN Replaced by: ondansetron 4 MG/2ML Soln injection   phenytoin 100 MG ER capsule Commonly known as: DILANTIN       TAKE these medications    docusate  sodium 100 MG capsule Commonly known as: COLACE Take 100 mg by mouth daily as needed for mild constipation.   EPINEPHrine 0.3 mg/0.3 mL Soaj injection Commonly known as: EPI-PEN SMARTSIG:1 pre-filled pen syringe IM Once   fentaNYL 50 MCG/ML injection Commonly known as: SUBLIMAZE Inject 0.25 mLs (12.5 mcg total) into the vein every 4 (four) hours as needed for moderate pain or severe pain.   glycopyrrolate 1 MG tablet Commonly known as: ROBINUL Take 1 tablet (1 mg total) by mouth every 4 (four) hours as needed (excessive secretions).   haloperidol 0.5 MG tablet Commonly known as: HALDOL Take 1 tablet (0.5 mg total) by mouth every 4 (four) hours as needed for agitation (  or delirium).   LORazepam 2 MG/ML injection Commonly known as: ATIVAN Inject 1 mL (2 mg total) into the vein every 10 (ten) minutes as needed for seizure. Replaces: LORazepam 2 MG tablet   LORazepam 1 MG tablet Commonly known as: ATIVAN Take 1 tablet (1 mg total) by mouth every 4 (four) hours as needed for anxiety.   morphine CONCENTRATE 10 MG/0.5ML Soln concentrated solution Take 0.25 mLs (5 mg total) by mouth every 2 (two) hours as needed for moderate pain (or dyspnea).   mouth rinse Liqd solution 15 mLs by Mouth Rinse route as needed (oral care).   ondansetron 4 MG disintegrating tablet Commonly known as: ZOFRAN-ODT Take 1 tablet (4 mg total) by mouth every 6 (six) hours as needed for nausea.   ondansetron 4 MG/2ML Soln injection Commonly known as: ZOFRAN Inject 2 mLs (4 mg total) into the vein every 6 (six) hours as needed for nausea. Replaces: ondansetron 4 MG tablet   polyethylene glycol 17 g packet Commonly known as: MIRALAX / GLYCOLAX Take 17 g by mouth daily as needed for moderate constipation.   polyvinyl alcohol 1.4 % ophthalmic solution Commonly known as: LIQUIFILM TEARS Place 1 drop into both eyes 4 (four) times daily as needed for dry eyes.   ProAir RespiClick 193 (90 Base) MCG/ACT  Aepb Generic drug: Albuterol Sulfate Inhale 1 puff into the lungs every 4 (four) hours as needed (shortness of breath).   senna 8.6 MG Tabs tablet Commonly known as: SENOKOT Take 2 tablets by mouth daily as needed for mild constipation.   Torsemide 40 MG Tabs Take 40 mg by mouth daily.               Discharge Care Instructions  (From admission, onward)           Start     Ordered   04-19-22 0000  Discharge wound care:        April 19, 2022 1157             The results of significant diagnostics from this hospitalization (including imaging, microbiology, ancillary and laboratory) are listed below for reference.    Procedures and Diagnostic Studies:   CT CHEST ABDOMEN PELVIS WO CONTRAST  Result Date: 04/01/2022 CLINICAL DATA:  Altered mental status; patient found unresponsive with blood in the commode EXAM: CT CHEST, ABDOMEN AND PELVIS WITHOUT CONTRAST TECHNIQUE: Multidetector CT imaging of the chest, abdomen and pelvis was performed following the standard protocol without IV contrast. RADIATION DOSE REDUCTION: This exam was performed according to the departmental dose-optimization program which includes automated exposure control, adjustment of the mA and/or kV according to patient size and/or use of iterative reconstruction technique. COMPARISON:  None Available. FINDINGS: CT CHEST FINDINGS Cardiovascular: Coronary artery and aortic atherosclerosis. Left chest wall pacemaker. Normal heart size. No pericardial effusion. Mediastinum/Nodes: Unremarkable thyroid. No thoracic adenopathy by size. Unremarkable esophagus. Lungs/Pleura: Emphysema. Respiratory motion obscures the lung bases. No focal consolidation, pleural effusion, or pneumothorax. Small amount of secretions in the lower trachea. Musculoskeletal: No chest wall mass or suspicious bone lesions identified. CT ABDOMEN PELVIS FINDINGS Hepatobiliary: Respiratory motion obscures the upper abdomen. No definite liver abnormality.  Distended gallbladder. No definite gallstones or biliary dilation. Pancreas: No acute abnormality. Spleen: Unremarkable. Adrenals/Urinary Tract: Large simple right renal cysts not requiring follow-up. Smaller left renal cyst is not require follow-up. Cortical scarring. Punctate nonobstructing stone in the lower pole of the left kidney. No hydronephrosis. Nondistended bladder. Stable thickening of the left adrenal gland. Stomach/Bowel: Unremarkable stomach.  Normal caliber large and small bowel. Irregular wall thickening in the sigmoid colon may be due to underdistention however malignancy is not excluded. There are few sigmoid diverticula without diverticulitis. The appendix is not definitively visualized. Vascular/Lymphatic: Advanced aortic atherosclerosis. Infrarenal abdominal aortic aneurysm measuring 3.8 cm in diameter, unchanged using similar measuring technique. 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 No evidence leak or rupture on noncontrast exam. No suspicious lymphadenopathy. Reproductive: Enlarged prostate. Other: No free intraperitoneal fluid or air. Musculoskeletal: No acute or significant osseous findings. IMPRESSION: Wall thickening in the sigmoid colon without adjacent inflammatory change is indeterminate and malignancy is not excluded. Recommend correlation with colonoscopy. Infrarenal abdominal aortic aneurysm measuring 3.8 cm. 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 These are not significantly changed from 01/05/2022. Coronary artery atherosclerosis. Aortic Atherosclerosis (ICD10-I70.0) and Emphysema (ICD10-J43.9). Enlarged prostate. Electronically Signed   By: Placido Sou M.D.   On: 04/04/2022 02:02   CT Head Wo Contrast  Result Date: 03/28/2022 CLINICAL DATA:  Mental status change; found unresponsive EXAM: CT HEAD WITHOUT CONTRAST  TECHNIQUE: Contiguous axial images were obtained from the base of the skull through the vertex without intravenous contrast. RADIATION DOSE REDUCTION: This exam was performed according to the departmental dose-optimization program which includes automated exposure control, adjustment of the mA and/or kV according to patient size and/or use of iterative reconstruction technique. COMPARISON:  CT head 04/05/2022 FINDINGS: Brain: No intracranial hemorrhage, mass effect, or evidence of acute infarct. No hydrocephalus. No extra-axial fluid collection. Generalized cerebral atrophy. Ill-defined hypoattenuation within the cerebral white matter is nonspecific but consistent with chronic small vessel ischemic disease. Vascular: No hyperdense vessel. Intracranial arterial calcification. Skull: No fracture or focal lesion. Sinuses/Orbits: No acute finding. Paranasal sinuses and mastoid air cells are well aerated. Other: None. IMPRESSION: No acute intracranial abnormality. Electronically Signed   By: Placido Sou M.D.   On: 04/23/2022 01:48   DG Chest Portable 1 View  Result Date: 04/09/2022 CLINICAL DATA:  Pain EXAM: PORTABLE CHEST 1 VIEW COMPARISON:  04/05/2022 FINDINGS: Left AICD is unchanged. Heart and mediastinal contours are within normal limits. No focal opacities or effusions. No acute bony abnormality. Aortic atherosclerosis. IMPRESSION: No active cardiopulmonary disease. Electronically Signed   By: Rolm Baptise M.D.   On: 04/16/2022 00:45     Labs:   Basic Metabolic Panel: Recent Labs  Lab 04/10/2022 0027 04/23/2022 0210  NA 132* 125*  K 6.0* 6.5*  CL 93*  --   CO2 12*  --   GLUCOSE 153*  --   BUN 265*  --   CREATININE 20.07*  --   CALCIUM 8.9  --    GFR Estimated Creatinine Clearance: 2.3 mL/min (A) (by C-G formula based on SCr of 20.07 mg/dL (H)). Liver Function Tests: Recent Labs  Lab 04/14/2022 0027  AST 63*  ALT 95*  ALKPHOS 53  BILITOT 1.1  PROT 5.6*  ALBUMIN 2.9*   No results  for input(s): "LIPASE", "AMYLASE" in the last 168 hours. No results for input(s): "AMMONIA" in the last 168 hours. Coagulation profile No results for input(s): "INR", "PROTIME" in the last 168 hours.  CBC: Recent Labs  Lab 04/08/2022 0027 04/07/2022 0210  WBC 12.3*  --   NEUTROABS 10.9*  --   HGB 7.6* 8.8*  HCT 20.5* 26.0*  MCV 79.5*  --   PLT  135*  --    Cardiac Enzymes: No results for input(s): "CKTOTAL", "CKMB", "CKMBINDEX", "TROPONINI" in the last 168 hours. BNP: Invalid input(s): "POCBNP" CBG: Recent Labs  Lab 04/03/2022 0024 04/17/2022 0645 04/05/2022 0824  GLUCAP 141* 115* 159*   D-Dimer No results for input(s): "DDIMER" in the last 72 hours. Hgb A1c No results for input(s): "HGBA1C" in the last 72 hours. Lipid Profile No results for input(s): "CHOL", "HDL", "LDLCALC", "TRIG", "CHOLHDL", "LDLDIRECT" in the last 72 hours. Thyroid function studies No results for input(s): "TSH", "T4TOTAL", "T3FREE", "THYROIDAB" in the last 72 hours.  Invalid input(s): "FREET3" Anemia work up No results for input(s): "VITAMINB12", "FOLATE", "FERRITIN", "TIBC", "IRON", "RETICCTPCT" in the last 72 hours. Microbiology No results found for this or any previous visit (from the past 240 hour(s)).  Time coordinating discharge: 25 minutes  Signed: Cartina Brousseau  [facility] 2022/05/07, 11:57 AM

## 2022-04-24 NOTE — Progress Notes (Signed)
Manufacturing engineer [facility]) [place] [facility]  Patients ICD remains activated. It must be deactivated prior to him transferring to [facility].  Staffing working on getting it deactivated.  Please call and update [name] once patient discharges OR if patient ends up staying.  Thank you, Venia Carbon DNP, RN [facility] Liaison

## 2022-04-24 NOTE — Progress Notes (Signed)
    OVERNIGHT PROGRESS REPORT  Notified by RN that patient has expired at 2300.  Patient was comfort care.  2 RN verified.  Family has been notified by RN and they are plan to come see patient.    Raenette Rover, DNP, [place]

## 2022-04-24 NOTE — Death Summary Note (Signed)
DEATH SUMMARY   Anthony Foster Details  Name: Anthony Foster MRN: [id number] DOB: [birth year] JOI:NOMVEH, D[place] Ar, MD Admission/Discharge Information   Admit Date:  2022-05-03  Date of Death: Date of Death: 05-05-2022  Time of Death: Time of Death: 09/25/2298  Length of Stay: 2   Principle Cause of death: Acute renal failure, hyperkalemia, hypotension, combined CHF  Hospital Diagnoses: Principal Problem:   Hyperkalemia Active Problems:   Pressure injury of skin   Hospice care Anthony Foster  Hospital Course: Anthony Foster is a 81 y.o. 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 20/94, he was found by his grandson to be unresponsive in the bathroom.  EMS was activated and Anthony Foster was brought to the ED. Work-up in ED showed hyperkalemia, uremia, hypotension Anthony Foster was admitted to ICU for further care. Palliative care and hospice team was involved. They spoke with Anthony Foster and family. He was made comfort care and transfer out of ICU service   Subjective: Anthony Foster was seen and examined this morning.  Propped up in bed.  Shallow and slow breathing.  Tries to open eyes on touch.  No family at bedside.  Seemed comfortable. He was planned for discharge to home on 05-06-23 with resumption of hospice services. However because of the arrangements pending, he could not be discharged that day. In the night, Anthony Foster expired at 23:00.   Past course: Acute issues addressed in the hospital were: AKI on CKD -not candidate for HD Hyperkalemia/hypocalcemia  Acute blood loss anemia  Assessment and Plan: No notes have been filed under this hospital service. Service: Hospitalist        Procedures:   Consultations:   The results of significant diagnostics from this hospitalization (including imaging, microbiology, ancillary and laboratory) are listed below for reference.   Significant Diagnostic Studies: CT CHEST ABDOMEN PELVIS  WO CONTRAST  Result Date: 05/03/2022 CLINICAL DATA:  Altered mental status; Anthony Foster found unresponsive with blood in the commode EXAM: CT CHEST, ABDOMEN AND PELVIS WITHOUT CONTRAST TECHNIQUE: Multidetector CT imaging of the chest, abdomen and pelvis was performed following the standard protocol without IV contrast. RADIATION DOSE REDUCTION: This exam was performed according to the departmental dose-optimization program which includes automated exposure control, adjustment of the mA and/or kV according to Anthony Foster size and/or use of iterative reconstruction technique. COMPARISON:  None Available. FINDINGS: CT CHEST FINDINGS Cardiovascular: Coronary artery and aortic atherosclerosis. Left chest wall pacemaker. Normal heart size. No pericardial effusion. Mediastinum/Nodes: Unremarkable thyroid. No thoracic adenopathy by size. Unremarkable esophagus. Lungs/Pleura: Emphysema. Respiratory motion obscures the lung bases. No focal consolidation, pleural effusion, or pneumothorax. Small amount of secretions in the lower trachea. Musculoskeletal: No chest wall mass or suspicious bone lesions identified. CT ABDOMEN PELVIS FINDINGS Hepatobiliary: Respiratory motion obscures the upper abdomen. No definite liver abnormality. Distended gallbladder. No definite gallstones or biliary dilation. Pancreas: No acute abnormality. Spleen: Unremarkable. Adrenals/Urinary Tract: Large simple right renal cysts not requiring follow-up. Smaller left renal cyst is not require follow-up. Cortical scarring. Punctate nonobstructing stone in the lower pole of the left kidney. No hydronephrosis. Nondistended bladder. Stable thickening of the left adrenal gland. Stomach/Bowel: Unremarkable stomach. Normal caliber large and small bowel. Irregular wall thickening in the sigmoid colon may be due to underdistention however malignancy is not excluded. There are few sigmoid diverticula without diverticulitis. The appendix is not definitively visualized.  Vascular/Lymphatic: Advanced aortic atherosclerosis. 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 No evidence leak or rupture on noncontrast exam. No suspicious lymphadenopathy. Reproductive: Enlarged prostate. Other: No free intraperitoneal fluid or air. Musculoskeletal: No acute or significant osseous findings. IMPRESSION: Wall thickening in the sigmoid colon without adjacent inflammatory change is indeterminate and malignancy is not excluded. Recommend correlation with colonoscopy. Infrarenal abdominal aortic aneurysm measuring 3.8 cm. 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 These are not significantly changed from 01/05/2022. Coronary artery atherosclerosis. Aortic Atherosclerosis (ICD10-I70.0) and Emphysema (ICD10-J43.9). Enlarged prostate. Electronically Signed   By: Placido Sou M.D.   On: 03/25/2022 02:02   CT Head Wo Contrast  Result Date: 03/26/2022 CLINICAL DATA:  Mental status change; found unresponsive EXAM: CT HEAD WITHOUT CONTRAST TECHNIQUE: Contiguous axial images were obtained from the base of the skull through the vertex without intravenous contrast. RADIATION DOSE REDUCTION: This exam was performed according to the departmental dose-optimization program which includes automated exposure control, adjustment of the mA and/or kV according to Anthony Foster size and/or use of iterative reconstruction technique. COMPARISON:  CT head 04/05/2022 FINDINGS: Brain: No intracranial hemorrhage, mass effect, or evidence of acute infarct. No hydrocephalus. No extra-axial fluid collection. Generalized cerebral atrophy. Ill-defined hypoattenuation within the cerebral white matter is nonspecific but consistent with chronic small vessel ischemic  disease. Vascular: No hyperdense vessel. Intracranial arterial calcification. Skull: No fracture or focal lesion. Sinuses/Orbits: No acute finding. Paranasal sinuses and mastoid air cells are well aerated. Other: None. IMPRESSION: No acute intracranial abnormality. Electronically Signed   By: Placido Sou M.D.   On: 04/15/2022 01:48   DG Chest Portable 1 View  Result Date: 04/16/2022 CLINICAL DATA:  Pain EXAM: PORTABLE CHEST 1 VIEW COMPARISON:  04/05/2022 FINDINGS: Left AICD is unchanged. Heart and mediastinal contours are within normal limits. No focal opacities or effusions. No acute bony abnormality. Aortic atherosclerosis. IMPRESSION: No active cardiopulmonary disease. Electronically Signed   By: Rolm Baptise M.D.   On: 03/27/2022 00:45   CT Head Wo Contrast  Result Date: 04/05/2022 CLINICAL DATA:  Seizure, new-onset, no history of trauma EXAM: CT HEAD WITHOUT CONTRAST TECHNIQUE: Contiguous axial images were obtained from the base of the skull through the vertex without intravenous contrast. RADIATION DOSE REDUCTION: This exam was performed according to the departmental dose-optimization program which includes automated exposure control, adjustment of the mA and/or kV according to Anthony Foster size and/or use of iterative reconstruction technique. COMPARISON:  01/05/2022 FINDINGS: Brain: Normal anatomic configuration. Parenchymal volume loss is commensurate with the Anthony Foster's age. Mild periventricular white matter changes are present likely reflecting the sequela of small vessel ischemia. No abnormal intra or extra-axial mass lesion or fluid collection. No abnormal mass effect or midline shift. No evidence of acute intracranial hemorrhage or infarct. Ventricular size is normal. Cerebellum unremarkable. Vascular: No asymmetric hyperdense vasculature at the skull base. Skull: Intact Sinuses/Orbits: Paranasal sinuses are clear. Orbits are unremarkable. Other: Mastoid air cells and middle ear cavities are  clear. IMPRESSION: No acute intracranial hemorrhage or infarct. Mild senescent change. Electronically Signed   By: Fidela Salisbury M.D.   On: 04/05/2022 03:28   DG Chest Portable 1 View  Result Date: 04/05/2022 CLINICAL DATA:  Pulmonary edema EXAM: PORTABLE CHEST 1 VIEW COMPARISON:  12/30/2021 FINDINGS: The heart size and mediastinal contours are within normal limits. Both lungs are clear. The visualized skeletal structures  are unremarkable. Expected location of left chest wall AICD leads. IMPRESSION: No active disease. Expected location of left chest wall AICD leads. Electronically Signed   By: Ulyses Jarred M.D.   On: 04/05/2022 02:54   CUP PACEART REMOTE DEVICE CHECK  Result Date: 03/22/2022 Scheduled remote reviewed. Normal device function.  The device estimates 2.5 months There were two atrial arrhythmias detected that both lasted less than 10 seconds Next remote 04/18/2022. Kathy Breach, RN, CCDS, [employer]   Microbiology: No results found for this or any previous visit (from the past 240 hour(s)).  Time spent: 25 minutes  Signed: Terrilee Croak, MD 05-13-2022

## 2022-04-24 NOTE — TOC Transition Note (Signed)
Transition of Care [facility]) - CM/SW Discharge Note   Patient Details  Name: Anthony Foster MRN: [id number] Date of Birth: 1940/10/14  Transition of Care [facility]) CM/SW Contact:  Emeterio Reeve, LCSW Phone Number: April 29, 2022, 2:59 PM   Clinical Narrative:     Per MD patient ready for DC to [facility]. RN, patient, patient's family, and facility notified of DC.   Ambulance transport requested for patient.    RN to call report to [phone].  CSW will sign off for now as social work intervention is no longer needed. Please consult [place] again if new needs arise.   Final next level of care: [place] Barriers to Discharge: Barriers Resolved   Patient Goals and CMS Choice        Discharge Placement              Patient chooses bed at: Other - please specify in the comment section below: ([facility]) Patient to be transferred to facility by: [facility] Name of family member notified: [name], grandson Patient and family notified of of transfer: 04-29-22  Discharge Plan and Services                                     Social Determinants of Health (SDOH) Interventions     Readmission Risk Interventions    01/07/2022   11:19 AM  Readmission Risk Prevention Plan  Transportation Screening Complete  PCP or Specialist Appt within 3-5 Days Complete  HRI or [place] Complete  Social Work Consult for [place] Planning/Counseling Complete  Palliative Care Screening Complete  Medication Review [employer]) Referral to Pharmacy

## 2022-04-24 NOTE — Progress Notes (Signed)
[place] [address] [facility]) Hospitalized Hospice Patient   Mr. Lahey is a current hospice patient with a terminal diagnosis of combined systolic and diastolic heart failure, CKD stage 5. He was found by his grandson to be unresponsive in the bathroom, EMS was activated and he was sent to the ED for further evaluation. Hospice was notified after he was sent to the ED. He is admitted with acute on chronic renal failure, acute blood loss anemia secondary to GIB. This is a related hospital admission per Dr. Karie Georges, [facility] MD.   Report exchanged. Visited at the bedside, patient is confused and appeared to be in distress. RN in the process of medicating him. Plans to d/c today to [facility], but his ICD is still activated.   VS: 90/40, HR 51, RR 16, Spo2 95% RA I&O: none Labs: none  Diagnostics: no new since 10/19  IV/PRN: ativan 1 mg IV x 3 within last 24 hours.   Johndaniel Catlin is inpatient appropriate for treatment of ESRD and increased agitation and restlessness.    Problem List: - AKI on CKD - not a candidate for HD. Pt discussed with attending and decided to change to DNR status.  - hyperkalemia/hypocalcemia - lokelma and calcium gluconate administered, now shifted to full comfort measures - acute blood loss anemia - supportive measures only   D/C planning: D/C to [facility] once ICD deactivated. . GOC: DNR.  Family: not present at the bedside, but updated [name] over the phone IDT: hospice IDT updated  Please use [facility] for transport, he is an active hospice pt and they contract this service for [place].  Venia Carbon DNP, RN [facility] Liaison

## 2022-04-24 NOTE — Progress Notes (Signed)
Handoff report given to Clarene Critchley, nurse at [facility].

## 2022-04-24 DEATH — deceased
